# Patient Record
Sex: Female | Born: 1950 | Race: Black or African American | Hispanic: No | Smoking: Former smoker
Health system: Southern US, Community
[De-identification: ages and names within clinical notes are randomized; demographics above are authoritative.]

## PROBLEM LIST (undated history)

## (undated) DIAGNOSIS — K219 Gastro-esophageal reflux disease without esophagitis: Secondary | ICD-10-CM

## (undated) DIAGNOSIS — E119 Type 2 diabetes mellitus without complications: Secondary | ICD-10-CM

## (undated) DIAGNOSIS — D649 Anemia, unspecified: Secondary | ICD-10-CM

## (undated) DIAGNOSIS — G629 Polyneuropathy, unspecified: Secondary | ICD-10-CM

## (undated) DIAGNOSIS — M1990 Unspecified osteoarthritis, unspecified site: Secondary | ICD-10-CM

## (undated) DIAGNOSIS — C801 Malignant (primary) neoplasm, unspecified: Secondary | ICD-10-CM

## (undated) DIAGNOSIS — E669 Obesity, unspecified: Secondary | ICD-10-CM

## (undated) DIAGNOSIS — E785 Hyperlipidemia, unspecified: Secondary | ICD-10-CM

## (undated) DIAGNOSIS — M549 Dorsalgia, unspecified: Secondary | ICD-10-CM

## (undated) DIAGNOSIS — G919 Hydrocephalus, unspecified: Secondary | ICD-10-CM

## (undated) DIAGNOSIS — I10 Essential (primary) hypertension: Principal | ICD-10-CM

## (undated) DIAGNOSIS — E039 Hypothyroidism, unspecified: Secondary | ICD-10-CM

## (undated) DIAGNOSIS — G8929 Other chronic pain: Secondary | ICD-10-CM

## (undated) HISTORY — DX: Dorsalgia, unspecified: M54.9

## (undated) HISTORY — DX: Obesity, unspecified: E66.9

## (undated) HISTORY — DX: Type 2 diabetes mellitus without complications: E11.9

## (undated) HISTORY — DX: Unspecified osteoarthritis, unspecified site: M19.90

## (undated) HISTORY — DX: Hydrocephalus, unspecified: G91.9

## (undated) HISTORY — PX: TUBAL LIGATION: SHX77

## (undated) HISTORY — PX: OTHER SURGICAL HISTORY: SHX169

## (undated) HISTORY — DX: Hyperlipidemia, unspecified: E78.5

## (undated) HISTORY — DX: Essential (primary) hypertension: I10

## (undated) HISTORY — DX: Malignant (primary) neoplasm, unspecified: C80.1

## (undated) HISTORY — DX: Gastro-esophageal reflux disease without esophagitis: K21.9

## (undated) HISTORY — DX: Other chronic pain: G89.29

## (undated) HISTORY — PX: TRIGGER FINGER RELEASE: SHX641

---

## 1978-04-05 DIAGNOSIS — I10 Essential (primary) hypertension: Secondary | ICD-10-CM

## 1978-04-05 HISTORY — PX: TUBAL LIGATION: SHX77

## 1978-04-05 HISTORY — DX: Essential (primary) hypertension: I10

## 1989-04-05 DIAGNOSIS — E119 Type 2 diabetes mellitus without complications: Secondary | ICD-10-CM

## 1989-04-05 HISTORY — DX: Type 2 diabetes mellitus without complications: E11.9

## 1996-04-05 HISTORY — PX: VENTRICULOPERITONEAL SHUNT: SHX204

## 1996-04-05 HISTORY — PX: OTHER SURGICAL HISTORY: SHX169

## 1998-04-05 DIAGNOSIS — K219 Gastro-esophageal reflux disease without esophagitis: Secondary | ICD-10-CM

## 1998-04-05 HISTORY — DX: Gastro-esophageal reflux disease without esophagitis: K21.9

## 2000-10-09 ENCOUNTER — Emergency Department (HOSPITAL_COMMUNITY): Admission: EM | Admit: 2000-10-09 | Discharge: 2000-10-09 | Payer: Self-pay | Admitting: Internal Medicine

## 2001-06-05 ENCOUNTER — Encounter: Payer: Self-pay | Admitting: Unknown Physician Specialty

## 2001-06-05 ENCOUNTER — Ambulatory Visit (HOSPITAL_COMMUNITY): Admission: RE | Admit: 2001-06-05 | Discharge: 2001-06-05 | Payer: Self-pay | Admitting: Unknown Physician Specialty

## 2001-06-19 ENCOUNTER — Ambulatory Visit (HOSPITAL_COMMUNITY): Admission: RE | Admit: 2001-06-19 | Discharge: 2001-06-19 | Payer: Self-pay | Admitting: Unknown Physician Specialty

## 2001-09-01 ENCOUNTER — Emergency Department (HOSPITAL_COMMUNITY): Admission: EM | Admit: 2001-09-01 | Discharge: 2001-09-01 | Payer: Self-pay | Admitting: Emergency Medicine

## 2001-09-15 ENCOUNTER — Encounter: Payer: Self-pay | Admitting: Family Medicine

## 2001-09-15 ENCOUNTER — Ambulatory Visit (HOSPITAL_COMMUNITY): Admission: RE | Admit: 2001-09-15 | Discharge: 2001-09-15 | Payer: Self-pay | Admitting: Family Medicine

## 2001-09-28 ENCOUNTER — Emergency Department (HOSPITAL_COMMUNITY): Admission: EM | Admit: 2001-09-28 | Discharge: 2001-09-28 | Payer: Self-pay | Admitting: Emergency Medicine

## 2002-10-15 ENCOUNTER — Emergency Department (HOSPITAL_COMMUNITY): Admission: EM | Admit: 2002-10-15 | Discharge: 2002-10-15 | Payer: Self-pay | Admitting: Emergency Medicine

## 2002-10-16 ENCOUNTER — Encounter: Payer: Self-pay | Admitting: Emergency Medicine

## 2002-10-16 ENCOUNTER — Emergency Department (HOSPITAL_COMMUNITY): Admission: RE | Admit: 2002-10-16 | Discharge: 2002-10-16 | Payer: Self-pay | Admitting: Emergency Medicine

## 2003-01-10 ENCOUNTER — Ambulatory Visit (HOSPITAL_COMMUNITY): Admission: RE | Admit: 2003-01-10 | Discharge: 2003-01-10 | Payer: Self-pay | Admitting: Family Medicine

## 2004-01-13 ENCOUNTER — Ambulatory Visit (HOSPITAL_COMMUNITY): Admission: RE | Admit: 2004-01-13 | Discharge: 2004-01-13 | Payer: Self-pay | Admitting: Family Medicine

## 2004-09-22 ENCOUNTER — Emergency Department (HOSPITAL_COMMUNITY): Admission: EM | Admit: 2004-09-22 | Discharge: 2004-09-23 | Payer: Self-pay | Admitting: *Deleted

## 2004-12-23 ENCOUNTER — Emergency Department (HOSPITAL_COMMUNITY): Admission: EM | Admit: 2004-12-23 | Discharge: 2004-12-23 | Payer: Self-pay | Admitting: Emergency Medicine

## 2005-04-05 DIAGNOSIS — G8929 Other chronic pain: Secondary | ICD-10-CM

## 2005-04-05 HISTORY — DX: Other chronic pain: G89.29

## 2005-05-10 ENCOUNTER — Ambulatory Visit (HOSPITAL_COMMUNITY): Admission: RE | Admit: 2005-05-10 | Discharge: 2005-05-10 | Payer: Self-pay | Admitting: Family Medicine

## 2005-05-28 ENCOUNTER — Emergency Department (HOSPITAL_COMMUNITY): Admission: EM | Admit: 2005-05-28 | Discharge: 2005-05-28 | Payer: Self-pay | Admitting: Emergency Medicine

## 2006-03-28 ENCOUNTER — Ambulatory Visit (HOSPITAL_COMMUNITY): Admission: RE | Admit: 2006-03-28 | Discharge: 2006-03-28 | Payer: Self-pay | Admitting: Family Medicine

## 2006-05-24 ENCOUNTER — Ambulatory Visit (HOSPITAL_COMMUNITY): Admission: RE | Admit: 2006-05-24 | Discharge: 2006-05-24 | Payer: Self-pay | Admitting: Family Medicine

## 2006-06-16 ENCOUNTER — Ambulatory Visit (HOSPITAL_COMMUNITY): Admission: RE | Admit: 2006-06-16 | Discharge: 2006-06-16 | Payer: Self-pay | Admitting: Obstetrics and Gynecology

## 2007-06-07 ENCOUNTER — Ambulatory Visit (HOSPITAL_COMMUNITY): Admission: RE | Admit: 2007-06-07 | Discharge: 2007-06-07 | Payer: Self-pay | Admitting: Obstetrics and Gynecology

## 2007-07-07 ENCOUNTER — Other Ambulatory Visit: Admission: RE | Admit: 2007-07-07 | Discharge: 2007-07-07 | Payer: Self-pay | Admitting: Obstetrics and Gynecology

## 2008-07-03 ENCOUNTER — Ambulatory Visit (HOSPITAL_COMMUNITY): Admission: RE | Admit: 2008-07-03 | Discharge: 2008-07-03 | Payer: Self-pay | Admitting: Family Medicine

## 2008-08-01 ENCOUNTER — Other Ambulatory Visit: Admission: RE | Admit: 2008-08-01 | Discharge: 2008-08-01 | Payer: Self-pay | Admitting: Obstetrics and Gynecology

## 2009-05-12 ENCOUNTER — Encounter: Payer: Self-pay | Admitting: Family Medicine

## 2009-07-04 ENCOUNTER — Emergency Department (HOSPITAL_COMMUNITY): Admission: EM | Admit: 2009-07-04 | Discharge: 2009-07-04 | Payer: Self-pay | Admitting: Emergency Medicine

## 2009-07-08 ENCOUNTER — Ambulatory Visit (HOSPITAL_COMMUNITY): Admission: RE | Admit: 2009-07-08 | Discharge: 2009-07-08 | Payer: Self-pay | Admitting: Family Medicine

## 2009-09-07 ENCOUNTER — Emergency Department (HOSPITAL_COMMUNITY): Admission: EM | Admit: 2009-09-07 | Discharge: 2009-09-07 | Payer: Self-pay | Admitting: Emergency Medicine

## 2009-10-13 ENCOUNTER — Ambulatory Visit: Payer: Self-pay | Admitting: Family Medicine

## 2009-10-13 DIAGNOSIS — I10 Essential (primary) hypertension: Secondary | ICD-10-CM

## 2009-10-13 DIAGNOSIS — E669 Obesity, unspecified: Secondary | ICD-10-CM

## 2009-10-16 ENCOUNTER — Encounter: Payer: Self-pay | Admitting: Family Medicine

## 2009-10-16 ENCOUNTER — Other Ambulatory Visit: Admission: RE | Admit: 2009-10-16 | Discharge: 2009-10-16 | Payer: Self-pay | Admitting: Obstetrics and Gynecology

## 2009-10-19 DIAGNOSIS — G5600 Carpal tunnel syndrome, unspecified upper limb: Secondary | ICD-10-CM

## 2009-10-21 ENCOUNTER — Ambulatory Visit (HOSPITAL_COMMUNITY)
Admission: RE | Admit: 2009-10-21 | Discharge: 2009-10-21 | Payer: Self-pay | Source: Home / Self Care | Admitting: Obstetrics and Gynecology

## 2009-10-21 ENCOUNTER — Encounter: Payer: Self-pay | Admitting: Family Medicine

## 2009-10-24 ENCOUNTER — Encounter: Payer: Self-pay | Admitting: Family Medicine

## 2009-11-06 ENCOUNTER — Ambulatory Visit: Payer: Self-pay | Admitting: Orthopedic Surgery

## 2009-11-10 ENCOUNTER — Encounter: Payer: Self-pay | Admitting: Orthopedic Surgery

## 2009-11-14 ENCOUNTER — Telehealth: Payer: Self-pay | Admitting: Orthopedic Surgery

## 2009-11-18 ENCOUNTER — Ambulatory Visit: Payer: Self-pay | Admitting: Family Medicine

## 2009-11-18 DIAGNOSIS — M549 Dorsalgia, unspecified: Secondary | ICD-10-CM

## 2009-11-19 ENCOUNTER — Encounter: Payer: Self-pay | Admitting: Family Medicine

## 2009-11-20 LAB — CONVERTED CEMR LAB
AST: 16 units/L (ref 0–37)
Alkaline Phosphatase: 58 units/L (ref 39–117)
BUN: 20 mg/dL (ref 6–23)
CO2: 26 meq/L (ref 19–32)
Calcium: 9.3 mg/dL (ref 8.4–10.5)
Chloride: 105 meq/L (ref 96–112)
Cholesterol: 141 mg/dL (ref 0–200)
Creatinine, Ser: 1 mg/dL (ref 0.40–1.20)
Indirect Bilirubin: 0.2 mg/dL (ref 0.0–0.9)
Microalb Creat Ratio: 3.1 mg/g (ref 0.0–30.0)
Total Bilirubin: 0.3 mg/dL (ref 0.3–1.2)
Triglycerides: 140 mg/dL (ref <150)

## 2009-11-26 ENCOUNTER — Telehealth: Payer: Self-pay | Admitting: Family Medicine

## 2009-12-11 ENCOUNTER — Ambulatory Visit: Payer: Self-pay | Admitting: Orthopedic Surgery

## 2009-12-15 ENCOUNTER — Encounter: Payer: Self-pay | Admitting: Orthopedic Surgery

## 2009-12-29 ENCOUNTER — Telehealth: Payer: Self-pay | Admitting: Physician Assistant

## 2010-01-06 ENCOUNTER — Ambulatory Visit: Payer: Self-pay | Admitting: Family Medicine

## 2010-01-30 ENCOUNTER — Encounter: Payer: Self-pay | Admitting: Internal Medicine

## 2010-02-02 ENCOUNTER — Telehealth (INDEPENDENT_AMBULATORY_CARE_PROVIDER_SITE_OTHER): Payer: Self-pay

## 2010-02-09 ENCOUNTER — Telehealth (INDEPENDENT_AMBULATORY_CARE_PROVIDER_SITE_OTHER): Payer: Self-pay

## 2010-02-09 ENCOUNTER — Telehealth: Payer: Self-pay | Admitting: Family Medicine

## 2010-02-11 ENCOUNTER — Telehealth: Payer: Self-pay | Admitting: Family Medicine

## 2010-02-25 ENCOUNTER — Telehealth (INDEPENDENT_AMBULATORY_CARE_PROVIDER_SITE_OTHER): Payer: Self-pay | Admitting: *Deleted

## 2010-03-10 LAB — CONVERTED CEMR LAB
Basophils Relative: 0 % (ref 0–1)
Eosinophils Absolute: 0.2 10*3/uL (ref 0.0–0.7)
Hgb A1c MFr Bld: 7.4 % — ABNORMAL HIGH (ref <5.7)
Lymphocytes Relative: 32 % (ref 12–46)
Lymphs Abs: 2.7 10*3/uL (ref 0.7–4.0)
MCHC: 32.4 g/dL (ref 30.0–36.0)
MCV: 83.3 fL (ref 78.0–100.0)
Monocytes Absolute: 0.5 10*3/uL (ref 0.1–1.0)
Monocytes Relative: 6 % (ref 3–12)
Neutrophils Relative %: 59 % (ref 43–77)
Platelets: 335 10*3/uL (ref 150–400)
RDW: 14.6 % (ref 11.5–15.5)

## 2010-03-12 ENCOUNTER — Ambulatory Visit: Payer: Self-pay | Admitting: Family Medicine

## 2010-03-12 LAB — HM DIABETES FOOT EXAM

## 2010-03-17 ENCOUNTER — Telehealth: Payer: Self-pay | Admitting: Family Medicine

## 2010-03-18 ENCOUNTER — Encounter: Payer: Self-pay | Admitting: Family Medicine

## 2010-03-20 ENCOUNTER — Encounter (INDEPENDENT_AMBULATORY_CARE_PROVIDER_SITE_OTHER): Payer: Self-pay

## 2010-03-24 ENCOUNTER — Telehealth: Payer: Self-pay | Admitting: Family Medicine

## 2010-03-25 ENCOUNTER — Encounter: Payer: Self-pay | Admitting: Internal Medicine

## 2010-03-26 ENCOUNTER — Encounter: Payer: Self-pay | Admitting: Internal Medicine

## 2010-04-17 ENCOUNTER — Telehealth (INDEPENDENT_AMBULATORY_CARE_PROVIDER_SITE_OTHER): Payer: Self-pay

## 2010-04-21 ENCOUNTER — Ambulatory Visit
Admission: RE | Admit: 2010-04-21 | Discharge: 2010-04-21 | Payer: Self-pay | Source: Home / Self Care | Attending: Family Medicine | Admitting: Family Medicine

## 2010-04-21 ENCOUNTER — Ambulatory Visit (HOSPITAL_COMMUNITY): Admission: RE | Admit: 2010-04-21 | Payer: Self-pay | Source: Home / Self Care | Admitting: Internal Medicine

## 2010-04-21 DIAGNOSIS — K029 Dental caries, unspecified: Secondary | ICD-10-CM | POA: Insufficient documentation

## 2010-04-21 DIAGNOSIS — B369 Superficial mycosis, unspecified: Secondary | ICD-10-CM | POA: Insufficient documentation

## 2010-04-22 ENCOUNTER — Telehealth: Payer: Self-pay | Admitting: Family Medicine

## 2010-04-30 ENCOUNTER — Telehealth: Payer: Self-pay | Admitting: Family Medicine

## 2010-05-05 ENCOUNTER — Ambulatory Visit
Admission: RE | Admit: 2010-05-05 | Discharge: 2010-05-05 | Payer: Self-pay | Source: Home / Self Care | Attending: Family Medicine | Admitting: Family Medicine

## 2010-05-05 NOTE — Letter (Signed)
Summary: TCS ORDER/TRIAGE  TCS ORDER/TRIAGE   Imported By: Rexene Alberts 01/30/2010 14:36:52  _____________________________________________________________________  External Attachment:    Type:   Image     Comment:   External Document

## 2010-05-05 NOTE — Progress Notes (Signed)
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 pt has procedure on 02/10/2010. she will finish abx on 02/09/2010. Do you want her to reschedule or will her current use of abx be ok? please advise Initial call taken by: Hendricks Limes LPN,  February 02, 2010 3:34 PM     Appended Document: pt started on abx today will be ok  Appended Document: pt started on abx today Pt informed.

## 2010-05-05 NOTE — Progress Notes (Signed)
Summary: speak with nurse  Phone Note Call from Patient   Summary of Call: pt needs to speak with nurse about medicine. [phone] Initial call taken by: Rudene Anda,  November 26, 2009 11:11 AM  Follow-up for Phone Call        patient states you had discussed changing BP meds diovan and tekturna  advised i only see where amlodipine was increased Follow-up by: Adella Hare LPN,  November 26, 2009 4:35 PM  Additional Follow-up for Phone Call Additional follow up Details #1::        pls advise the combination will be with the amlodipine and diovan , so i have to see if the inc dose of diovan works first, then after I can change the tekturna to tekturna/hctz also Additional Follow-up by: Syliva Overman MD,  November 26, 2009 5:12 PM    Additional Follow-up for Phone Call Additional follow up Details #2::    patient aware Follow-up by: Adella Hare LPN,  November 27, 2009 11:37 AM

## 2010-05-05 NOTE — Progress Notes (Signed)
Summary: MEDICINE  Phone Note Call from Patient   Reason for Call: Talk to Nurse Summary of Call: NEEDS ACCU CHECK STRIPS FROM [facility] AND NEEDS GABAPENTIN [facility] 100 MG  DR. Nolen Mu GAVE IR TO HER AND WANTS YOU TO START WRITTING IT FOR HER SHE MISSED HER LAST APPT. WITH HIM SHE WAS HAVING PROBLEMS WITH HER LEGS  [facility] FAX #  [phone] CALL WHEN DONE SO SHE WILL KNOW Initial call taken by: Lind Guest,  February 11, 2010 8:55 AM  Follow-up for Phone Call        strips sent, will you be filling the gabapentin? Follow-up by: Adella Hare LPN,  February 11, 2010 10:25 AM  Additional Follow-up for Phone Call Additional follow up Details #1::        script printed pls let herknow and fax Additional Follow-up by: Syliva Overman MD,  February 11, 2010 5:19 PM    Additional Follow-up for Phone Call Additional follow up Details #2::    where is the rx at  Follow-up by: Lind Guest,  February 12, 2010 2:38 PM  Additional Follow-up for Phone Call Additional follow up Details #3:: Details for Additional Follow-up Action Taken: never mind the patient is aware Additional Follow-up by: Lind Guest,  February 12, 2010 2:39 PM  New/Updated Medications: ACCU-CHEK AVIVA  STRP (GLUCOSE BLOOD) three times a day testing YN:WGNF GABAPENTIN 100 MG CAPS (GABAPENTIN) Take 1 capsule by mouth three times a day Prescriptions: GABAPENTIN 100 MG CAPS (GABAPENTIN) Take 1 capsule by mouth three times a day  #270 x 3   Entered and Authorized by:   Syliva Overman MD   Signed by:   Syliva Overman MD on 02/11/2010   Method used:   Printed then faxed to ...       [facility] (mail-order)             , [place]         Ph: [phone]       Fax: [phone]   RxID:   [id number] ACCU-CHEK AVIVA  STRP (GLUCOSE BLOOD) three times a day testing QI:HKVQ  #270 x 0   Entered by:   Adella Hare LPN   Authorized by:   Syliva Overman MD   Signed by:   Adella Hare  LPN on 25/95/6387   Method used:   Faxed to ...       [facility] (mail-order)             , [place]         Ph: [phone]       Fax: [phone]   RxID:   [id number]  Gabapentin scripts have been faxed

## 2010-05-05 NOTE — Progress Notes (Signed)
Summary: back hurting  Phone Note Call from Patient   Summary of Call: back is hurting so bad she is keeping a heating pad on it and wants to be ref to to dr. Rhoderick Moody in [place] # [phone] call her at [phone] to let her know Initial call taken by: Lind Guest,  February 25, 2010 3:49 PM  Follow-up for Phone Call        advised will let dr simpson know but will be the first of next week before we can make referal Follow-up by: Adella Hare LPN,  February 25, 2010 4:36 PM  Additional Follow-up for Phone Call Additional follow up Details #1::        pls advise referral is fine, i will put in box, pls sched apptper pt request Additional Follow-up by: Syliva Overman MD,  March 01, 2010 5:24 AM    Additional Follow-up for Phone Call Additional follow up Details #2::    patient has an appt with Dr. Magnus Sinning on Dec. 6 at 10:30 patient is aware. Follow-up by: Curtis Sites,  March 02, 2010 9:06 AM

## 2010-05-05 NOTE — Assessment & Plan Note (Signed)
Summary: FOLLOW UP   Vital Signs:  Patient profile:   60 year old female Menstrual status:  postmenopausal Height:      62.5 inches Weight:      176.75 pounds BMI:     31.93 O2 Sat:      99 % on Room air Pulse rate:   97 / minute Pulse rhythm:   regular Resp:     16 per minute BP sitting:   140 / 60  (left arm)  Vitals Entered By: Adella Hare LPN (January 06, 2010 9:59 AM)  Nutrition Counseling: Patient's BMI is greater than 25 and therefore counseled on weight management options.  O2 Flow:  Room air CC: FOLLOW UP Is Patient Diabetic? Yes Did you bring your meter with you today? No Pain Assessment Patient in pain? no      Comments COMPLAINS OF SOME CHRONIC BACK PAIN   CC:  FOLLOW UP.  History of Present Illness: Reports  that she has been  doing well. Denies recent fever or chills. Denies sinus pressure, nasal congestion , ear pain or sore throat. Denies chest congestion, or cough productive of sputum. Denies chest pain, palpitations, PND, orthopnea or leg swelling. at times, she has felt as though her bl;ood pressure has been low and has experienced palpitations occasionally, in the office today her bP is still above goal. Denies abdominal pain, nausea, vomitting, diarrhea or constipation. Denies change in bowel movements or bloody stool. Denies dysuria , frequency, incontinence or hesitancy. Denies  joint pain, swelling, or reduced mobility. Denies headaches, vertigo, seizures. Denies depression, anxiety or insomnia. Denies  rash, lesions, or itch. She states her blood sugars have not been well controlled because of poor diet.she intends to change ths      Current Medications (verified): 1)  Tekturna Hct 300-12.5 Mg Tabs (Aliskiren-Hydrochlorothiazide) .... Take 1 Tablet By Mouth Once A Day 2)  Gabapentin 100 Mg Caps (Gabapentin) .... Take 1 Tablet By Mouth Three Times A Day 3)  Omeprazole 20 Mg Tbec (Omeprazole) .... Take 1 Tablet By Mouth Once A Day 4)   Ferrous Sulfate 325 (65 Fe) Mg Tbec (Ferrous Sulfate) .... Take 1 Tablet By Mouth Two Times A Day 5)  Aspir-Low 81 Mg Tbec (Aspirin) .... Take 1 Tablet By Mouth Once A Day 6)  Lantus Solostar 100 Unit/ml Soln (Insulin Glargine) .... 30 Units At Bedtime 7)  Diovan Hct 320-25 Mg Tabs (Valsartan-Hydrochlorothiazide) .... Take 1 Tablet By Mouth Once A Day 8)  Amlodipine Besylate 10 Mg Tabs (Amlodipine Besylate) .... Take 1 Tablet By Mouth Once A Day 9)  Cyclobenzaprine Hcl 10 Mg Tabs (Cyclobenzaprine Hcl) .... Take 1 Tab By Mouth At Bedtime As Needed 10)  Metformin Hcl 1000 Mg Tabs (Metformin Hcl) .... Take 1 Tablet By Mouth Two Times A Day 11)  Vitamin B 6 .... One Tab By Mouth Once Daily  Allergies (verified): 1)  ! * Glipizide 2)  ! * Avandia 3)  ! * Amaryl 4)  ! * Asa 325  Review of Systems      See HPI General:  Complains of fatigue. Eyes:  Complains of vision loss-both eyes; wears corrective lenses. Endo:  Denies excessive thirst and excessive urination. Heme:  Denies abnormal bruising and bleeding. Allergy:  Denies hives or rash and itching eyes.  Physical Exam  General:  Well-developedoverweight,in no acute distress; alert,appropriate and cooperative throughout examination HEENT: No facial asymmetry,  EOMI, No sinus tenderness, TM's Clear, oropharynx  pink and moist.  Chest: Clear to auscultation bilaterally.  CVS: S1, S2, No murmurs, No S3.   Abd: Soft, Nontender.  MS: decreased ROM spine,adequate in hips, shoulders and knees. positive tinnel's in both hands Ext: No edema.   CNS: CN 2-12 intact, power tone and sensation normal throughout.   Skin: Intact, no visible lesions or rashes.  Psych: Good eye contact, normal affect.  Memory intact, not anxious or depressed appearing.    Impression & Recommendations:  Problem # 1:  OBESITY, UNSPECIFIED (ICD-278.00) Assessment Unchanged  Ht: 62.5 (01/06/2010)   Wt: 176.75 (01/06/2010)   BMI: 31.93 (01/06/2010) therapeutic  lifestyle change discussed and encouraged  Problem # 2:  HYPERTENSION (ICD-401.9) Assessment: Improved  Her updated medication list for this problem includes:    Tekturna Hct 300-12.5 Mg Tabs (Aliskiren-hydrochlorothiazide) .Marland Kitchen... Take 1 tablet by mouth once a day    Diovan Hct 320-25 Mg Tabs (Valsartan-hydrochlorothiazide) .Marland Kitchen... Take 1 tablet by mouth once a day    Amlodipine Besylate 10 Mg Tabs (Amlodipine besylate) .Marland Kitchen... Take 1 tablet by mouth once a day  BP today: 140/60 Prior BP: 150/82 (11/18/2009)  Labs Reviewed: K+: 4.0 (11/19/2009) Creat: : 1.00 (11/19/2009)   Chol: 141 (11/19/2009)   HDL: 38 (11/19/2009)   LDL: 75 (11/19/2009)   TG: 140 (11/19/2009)  Problem # 3:  IDDM (ICD-250.01) Assessment: Deteriorated  Her updated medication list for this problem includes:    Aspir-low 81 Mg Tbec (Aspirin) .Marland Kitchen... Take 1 tablet by mouth once a day    Lantus Solostar 100 Unit/ml Soln (Insulin glargine) .Marland KitchenMarland KitchenMarland KitchenMarland Kitchen 30 units at bedtime    Diovan Hct 320-25 Mg Tabs (Valsartan-hydrochlorothiazide) .Marland Kitchen... Take 1 tablet by mouth once a day    Metformin Hcl 1000 Mg Tabs (Metformin hcl) .Marland Kitchen... Take 1 tablet by mouth two times a day  Orders: T- Hemoglobin A1C ([id number])  Labs Reviewed: Creat: 1.00 (11/19/2009)    Reviewed HgBA1c results: 8.3 (11/19/2009) Patient advised to reduce carbs and sweets, commit to regular physical activity, take meds as prescribed, test blood sugars as directed, and attempt to lose weight , to improve blood sugar control.  Complete Medication List: 1)  Tekturna Hct 300-12.5 Mg Tabs (Aliskiren-hydrochlorothiazide) .... Take 1 tablet by mouth once a day 2)  Gabapentin 100 Mg Caps (Gabapentin) .... Take 1 tablet by mouth three times a day 3)  Omeprazole 20 Mg Tbec (Omeprazole) .... Take 1 tablet by mouth once a day 4)  Ferrous Sulfate 325 (65 Fe) Mg Tbec (Ferrous sulfate) .... Take 1 tablet by mouth two times a day 5)  Aspir-low 81 Mg Tbec (Aspirin) .... Take 1 tablet by  mouth once a day 6)  Lantus Solostar 100 Unit/ml Soln (Insulin glargine) .... 30 units at bedtime 7)  Diovan Hct 320-25 Mg Tabs (Valsartan-hydrochlorothiazide) .... Take 1 tablet by mouth once a day 8)  Amlodipine Besylate 10 Mg Tabs (Amlodipine besylate) .... Take 1 tablet by mouth once a day 9)  Cyclobenzaprine Hcl 10 Mg Tabs (Cyclobenzaprine hcl) .... Take 1 tab by mouth at bedtime as needed 10)  Metformin Hcl 1000 Mg Tabs (Metformin hcl) .... Take 1 tablet by mouth two times a day 11)  Vitamin B 6  .... One tab by mouth once daily  Other Orders: T-CBC w/Diff [phone]) T-TSH [phone]) Influenza Vaccine MCR [phone])  Patient Instructions: 1)  F/U first week in December. 2)  It is important that you exercise regularly at least 40 minutes 5 times a week. If you develop chest pain, have severe difficulty breathing,  or feel very tired , stop exercising immediately and seek medical attention. 3)  You need to lose weight. Consider a lower calorie diet and regular exercise.  4)  Your blood pressure is better, no med change. 5)  Blood sugar is uncontrolled, pls check twice daily and keep with in range, increase lantus as directed , call with questions. 6)  Flu vac today 7)  . 8)  HbgA1C prior to visit, ICD-9: 9)  TSH prior to visit, ICD-9:   in December 10)  CBC w/ Diff prior to visit, ICD-9:   Immunizations Administered:  Influenza Vaccine # 1:    Vaccine Type: Fluvax MCR    Site: left deltoid    Mfr: novartis    Dose: 0.5 ml    Route: IM    Given by: Adella Hare LPN    Exp. Date: 08/2010    Lot #: [id number]    VIS given: 10/28/09 version given January 06, 2010.

## 2010-05-05 NOTE — Letter (Signed)
Summary: medical release  medical release   Imported By: Lind Guest 10/24/2009 11:37:00  _____________________________________________________________________  External Attachment:    Type:   Image     Comment:   External Document

## 2010-05-05 NOTE — Progress Notes (Signed)
Summary: [facility]  [facility]   Imported By: Lind Guest 10/21/2009 08:01:36  _____________________________________________________________________  External Attachment:    Type:   Image     Comment:   External Document
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
## 2010-05-05 NOTE — Progress Notes (Signed)
Summary: pt cancelled TCS  Phone Note Call from Patient   Caller: Patient Summary of Call: Pt Cx TCS, having issues with blood sugar dropping too much, had recent change in diabetic medicine. She wants to follow-up with Dr. Lodema Hong first about that and she will call to reschedule when blood sugar is better regulated. Informed Kim in Endo. Cancelled in IDX. Initial call taken by: Cloria Spring LPN,  February 09, 2010 10:42 AM     Appended Document: pt cancelled TCS noted  Appended Document: pt cancelled TCS Reminder placed in idx.
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
## 2010-05-05 NOTE — Assessment & Plan Note (Signed)
Summary: EVAL/TREAT CTS/REF [facility]F   Visit Type:  new patient Referring Provider:  Dr. Lodema Hong  CC:  Bilateral CTS.  History of Present Illness: I saw Christine Dominguez in the office today for an initial visit.  She is a 60 years old woman with the complaint of:  bilateral CTS.   This patient complains of significant pain and paresthesias of both upper extremities but she has not been treated.  She has some weakness in the hands with burning tingling sensation which is worse at night.  No prominence and noted there is no significant related repetitive activity.  She is diabetic.  She denies any history of thyroid disease.   Allergies: 1)  ! * Glipizide 2)  ! * Avandia 3)  ! * Amaryl 4)  ! 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 Neurologic:  Complains of numbness and tingling; denies unsteady gait, dizziness, tremors, and seizure. Musculoskeletal:  Complains of joint pain; denies swelling, instability, stiffness, redness, heat, and muscle pain. Immunology:  Complains of seasonal allergies; denies sinus problems and allergic to bee stings.  The review of systems is negative for Constitutional, Cardiovascular, Respiratory, Genitourinary, Endocrine, Psychiatric, Skin, HEENT, and Hemoatologic.  Physical Exam  Additional Exam:  this is a well-developed well-nourished female coming in hygiene are intact vital signs are stable as recorded.  She is oriented x3 her  mood and affect are normal she has normal gait and station  She has no lymphadenopathy she has mild swelling in both hands normal range of motion no instability and slightly weakened grip strength pinch shrink is normal her skin is normal and intact in both upper extremities  She exhibits normal pulse and temperature without edema or swelling in her radial and ulnar pulses of both RIGHT and LEFT arms  She has decreased sensation in both hands related to the carpal tunnel reflexes however are normal.  Coordination and balance are normal.     Impression & Recommendations:  Problem # 1:  CARPAL TUNNEL SYNDROME (ICD-354.0) Assessment Deteriorated  Orders: Neurology Referral (Neuro) New Patient Level III (43329)  Patient Instructions: 1)  Avoid repetitive activities and modify use of hand and wrist to reduce symptoms. Wear the cock-up wrist splint that will be provided until pain resolves then use nightly for prevention. Take medication as provided.  2)  Order Nerve Conduction Study  3)  F/u in 3 weeks

## 2010-05-05 NOTE — Assessment & Plan Note (Signed)
Summary: 3 WK RE-CK CTS/FOL/UP [facility]/CAF   Visit Type:  Follow-up Referring Kirk Sampley:  Dr. Lodema Hong  CC:  BILATERAL CTS.  History of Present Illness: I saw Christine Dominguez in the office today for a 3 week  followup visit.  She is a 60 years old woman with the complaint of:  bilateral CTS.  NCS results from Dr. Gerilyn Pilgrim.  Patient was put in a splint on her last visit. She says that the splint is helping. [at night]  Her hands are not swollen, she has full ROM, and she has normal grip strength   Stable carpal tunne syndrome at this time   Allergies: 1)  ! * Glipizide 2)  ! * Avandia 3)  ! * Amaryl 4)  ! * Asa 325  Past History:  Past Medical History: Last updated: 11/06/2009 Chronic back pain, disabled since 2007 Diabetes x 1991 Hypertensionx 1980 Obesity Tumor, right ear  Review of Systems Neurologic:  Complains of numbness and tingling; mild just at the tips of the finger .   Impression & Recommendations:  Problem # 1:  CARPAL TUNNEL SYNDROME (ICD-354.0) Assessment Improved  Orders: Est. Patient Level III (16109)  Patient Instructions: 1)  take viatmin B6 100 mg two times a day for 6 weeks  2)  wear brace at night  3)  return in 6 weeks

## 2010-05-05 NOTE — Progress Notes (Signed)
  Phone Note Call from Patient   Caller: Patient Summary of Call: states colonscopy was cancelled due to low blood sugars. She has had infected gum mnot eating well also eating better now that she is back in her own home, multiple episodes of low blood sugars. Advised reduce metformin to 500mg  twice dAILY AS BEFORE, ALSO REDUCE LANTUS TO 25 UNITS SEE HOW SUGARS DO, CALL back if needed Initial call taken by: Syliva Overman MD,  February 09, 2010 6:38 PM

## 2010-05-05 NOTE — Letter (Signed)
Summary: History from  History from   Imported By: Jacklynn Ganong 11/11/2009 16:52:47  _____________________________________________________________________  External Attachment:    Type:   Image     Comment:   External Document

## 2010-05-05 NOTE — Progress Notes (Signed)
Summary: blood pressure  Phone Note Call from Patient   Summary of Call: pt would like to know if she needs to take medicine for her blood pressure. 110-65  [phone] Initial call taken by: Rudene Anda,  December 29, 2009 10:38 AM  Follow-up for Phone Call        This morning her BP was 110/65.  It has been 105/50 at the lowest. She wanted to know if she needed to reduce BP med or what. I advised her that I couldn't change the dose but I would ask the PA about it tomorrow. If she started feeling lightheaded or faint then don't take it and when Dawn responds, I'll call her back about it Follow-up by: Everitt Amber LPN,  December 29, 2009 10:52 AM  Additional Follow-up for Phone Call Additional follow up Details #1::        Advise pt that 110/65 is good.  She needs to continue her BP medicine.  She has a follow up appt next week with Dr Lodema Hong & we will check her BP then. Additional Follow-up by: Esperanza Sheets PA,  December 30, 2009 8:15 AM    Additional Follow-up for Phone Call Additional follow up Details #2::    called patient left message  Follow-up by: Everitt Amber LPN,  December 30, 2009 9:57 AM  Additional Follow-up for Phone Call Additional follow up Details #3:: Details for Additional Follow-up Action Taken: Patient aware Additional Follow-up by: Everitt Amber LPN,  December 30, 2009 11:44 AM

## 2010-05-05 NOTE — Assessment & Plan Note (Signed)
Summary: new patient   Vital Signs:  Patient profile:   60 year old female Menstrual status:  postmenopausal Height:      62.5 inches Weight:      180.75 pounds BMI:     32.65 O2 Sat:      98 % Pulse rate:   83 / minute Pulse rhythm:   regular Resp:     16 per minute BP sitting:   160 / 80  (left arm) Cuff size:   large  Vitals Entered By: Everitt Amber LPN (October 13, 2009 3:50 PM)  Nutrition Counseling: Patient's BMI is greater than 25 and therefore counseled on weight management options. CC: NEW PATIENT ,states when she walks her BP drops lower     Menstrual Status postmenopausal   CC:  NEW PATIENT  and states when she walks her BP drops lower.  History of Present Illness: Seen at ED for left arm pain, EKG reportedly nl, she has never been to card and hads had no coronary artery disease symptoms. She has no known significant family h/o cAD symptoms. The pt states she has concerns about her blood pressure, both in medication choice as well as the control. She is new to this practice, and her stated concerns at this time relate to blood pressure management as well as to symtoms suggestrive of carpal tunnel. she has been disabled for sseveral years because of chronic back pain. She deenies any recent fever or chill;s. She denies symptoms of depression, anxiety or insomnia. She denies any recent fever or chills. She denies head or chest congestion. She denies any recent change in bowel movements, black or bloody stool. she denies dysuria, frequency or incontinence.She is post menopausal. she suffers from chronic back pain.    Preventive Screening-Counseling & Management  Alcohol-Tobacco     Smoking Status: never  Caffeine-Diet-Exercise     Does Patient Exercise: yes      Drug Use:  no.    Current Medications (verified): 1)  Tekturna Hct 300-12.5 Mg Tabs (Aliskiren-Hydrochlorothiazide) .... Take 1 Tablet By Mouth Once A Day 2)  Gabapentin 100 Mg Caps (Gabapentin) ....  Take 1 Tablet By Mouth Three Times A Day 3)  Naprosyn 500 Mg Tabs (Naproxen) .... Take 1 Tablet By Mouth Two Times A Day 4)  Verapamil Hcl 120 Mg Tabs (Verapamil Hcl) .... Take 1 Tablet By Mouth Two Times A Day 5)  Omeprazole 20 Mg Tbec (Omeprazole) .... Take 1 Tablet By Mouth Once A Day 6)  Ferrous Sulfate 325 (65 Fe) Mg Tbec (Ferrous Sulfate) .... Take 1 Tablet By Mouth Two Times A Day 7)  Metformin Hcl 500 Mg Tabs (Metformin Hcl) .... Take 1 Tablet By Mouth Two Times A Day 8)  Aspir-Low 81 Mg Tbec (Aspirin) .... Take 1 Tablet By Mouth Once A Day 9)  Lantus Solostar 100 Unit/ml Soln (Insulin Glargine) .... 30 Units At Bedtime 10)  Diovan Hct 320-25 Mg Tabs (Valsartan-Hydrochlorothiazide) .... Take 1 Tablet By Mouth Once A Day  Allergies (verified): 1)  ! * Glipizide 2)  ! * Avandia 3)  ! * Amaryl 4)  ! 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 HTN 1/2 brother-kidney failure  Social History: Occupation: disability Married Never Smoked Drug use-no Alcohol use-no Regular exercise-yes, walk mother of 3 children, all deceased  Smoking Status:  never Drug Use:  no Does Patient Exercise:  yes  Review of Systems      See HPI General:  Complains of fatigue. Eyes:  Complains of vision loss-both eyes; denies discharge, eye pain, and red eye; wears glasses. ENT:  Denies hoarseness, nasal congestion, sinus pressure, and sore throat. CV:  Denies chest pain or discomfort, difficulty breathing while lying down, lightheadness, near fainting, palpitations, shortness of  breath with exertion, and swelling of feet. Resp:  Denies cough, shortness of breath, and sputum productive. GI:  Denies abdominal pain, constipation, diarrhea, nausea, and vomiting. GU:  Denies discharge, dysuria, and urinary frequency; dr Emelda Fear does annual mamo and pap. MS:  Complains of low back pain, mid back pain, and stiffness; c/o bilateral hand pain with numbness, tingling and weakneess which is progressively worsening in the pasty 6  months. Derm:  Denies itching, lesion(s), and rash. Neuro:  Denies headaches, poor balance, and seizures. Psych:  Denies anxiety and depression. Endo:  Denies excessive thirst and excessive urination; tests infrequently, thinks her sugars are controlled. Heme:  Denies abnormal bruising and bleeding. Allergy:  Denies hives or rash and itching eyes.  Physical Exam  General:  Well-developedoverweight,in no acute distress; alert,appropriate and cooperative throughout examination HEENT: No facial asymmetry,  EOMI, No sinus tenderness, TM's Clear, oropharynx  pink and moist.   Chest: Clear to auscultation bilaterally.  CVS: S1, S2, No murmurs, No S3.   Abd: Soft, Nontender.  MS: decreased ROM spine,adequate in hips, shoulders and knees. positive tinnel's in both hands Ext: No edema.   CNS: CN 2-12 intact, power tone and sensation normal throughout.   Skin: Intact, no visible lesions or rashes.  Psych: Good eye contact, normal affect.  Memory intact, not anxious or depressed appearing.    Impression & Recommendations:  Problem # 1:  OBESITY, UNSPECIFIED (ICD-278.00) Assessment Comment Only  Ht: 62.5 (10/13/2009)   Wt: 180.75 (10/13/2009)   BMI: 32.65 (10/13/2009), pt encouraged to start regular exercise and to reduce caloric intake.  Problem # 2:  HYPERTENSION (ICD-401.9) Assessment: Comment Only  Her updated medication list for this problem includes:    Tekturna Hct 300-12.5 Mg Tabs (Aliskiren-hydrochlorothiazide) .Marland Kitchen... Take 1 tablet by mouth  once a day    Diovan Hct 320-25 Mg Tabs (Valsartan-hydrochlorothiazide) .Marland Kitchen... Take 1 tablet by mouth once a day    Amlodipine Besylate 5 Mg Tabs (Amlodipine besylate) .Marland Kitchen... 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 Take 1 tablet by mouth two times a day    Aspir-low 81 Mg Tbec (Aspirin) .Marland Kitchen... Take 1 tablet by mouth once a day    Lantus Solostar 100 Unit/ml Soln (Insulin glargine) .Marland KitchenMarland KitchenMarland KitchenMarland Kitchen 30 units at bedtime    Diovan Hct 320-25 Mg Tabs (Valsartan-hydrochlorothiazide) .Marland Kitchen... Take 1 tablet by mouth once a day need to obtain most recent labs from previous MD  Problem # 4:  CARPAL TUNNEL SYNDROME (ICD-354.0) Assessment: Comment Only  Orders: Orthopedic Referral (Ortho)  Complete Medication List: 1)  Tekturna Hct 300-12.5  Mg Tabs (Aliskiren-hydrochlorothiazide) .... Take 1 tablet by mouth once a day 2)  Gabapentin 100 Mg Caps (Gabapentin) .... Take 1 tablet by mouth three times a day 3)  Naprosyn 500 Mg Tabs (Naproxen) .... Take 1 tablet by mouth two times a day 4)  Omeprazole 20 Mg Tbec (Omeprazole) .... Take 1 tablet by mouth once a day 5)  Ferrous Sulfate 325 (65 Fe) Mg Tbec (Ferrous sulfate) .... Take 1 tablet by mouth two times a day 6)  Metformin Hcl 500 Mg Tabs (Metformin hcl) .... Take 1 tablet by mouth two times a day 7)  Aspir-low 81 Mg Tbec (Aspirin) .... Take 1 tablet by mouth once a day 8)  Lantus Solostar 100 Unit/ml Soln (Insulin glargine) .... 30 units at bedtime 9)  Diovan Hct 320-25 Mg Tabs (Valsartan-hydrochlorothiazide) .... Take 1 tablet by mouth once a day 10)  Amlodipine Besylate 5 Mg Tabs (Amlodipine besylate) .... Take 1 tablet by mouth once a day  Other Orders: Pneumococcal Vaccine  (16109) Admin 1st Vaccine (60454) Admin 1st Vaccine [facility]) [phone])  Patient Instructions: 1)  Please schedule a follow-up appointment in 5 weeks 2)  It is important that you exercise regularly at least 20 minutes 5 times a week. If you develop chest pain, have severe difficulty breathing, or feel very tired , stop exercising immediately and seek medical attention. 3)  You need to lose weight. Consider a lower calorie diet and regular exercise.  4)  Check your blood sugars regularly. If your readings are usually above 250 or below 70 you should contact our office. 5)  New med for blood pressure, pls stop the  cardizem 6)  You will be referred for carpal tunnel eval. 7)  You need a colonscopy Prescriptions: AMLODIPINE BESYLATE 5 MG TABS (AMLODIPINE BESYLATE) Take 1 tablet by mouth once a day  #30 x 3   Entered and Authorized by:   Syliva Overman MD   Signed by:   Syliva Overman MD on 10/13/2009   Method used:   Electronically to        [facility] (retail)       [address]       [place], [place]  [address]       Ph: [phone]       Fax: [phone]   RxID:   [id number]    Pneumovax    Vaccine Type: Pneumovax    Site: right deltoid    Mfr: Merck    Dose: 0.5 ml    Route: IM    Given by: Everitt Amber LPN    Exp. Date: 04/22/2011    Lot #: [id number]

## 2010-05-07 NOTE — Progress Notes (Signed)
Summary: medicine  Phone Note Call from Patient   Summary of Call: [facility] did not recieve her rx for the clonidine please send and let her know that it is done  call back at [phone] Initial call taken by: Lind Guest,  April 22, 2010 7:51 AM  Follow-up for Phone Call        pls call in script for the clonidine which was sent yesterday and letpt know Follow-up by: Syliva Overman MD,  April 22, 2010 8:05 AM    Prescriptions: MAXZIDE-25 37.5-25 MG TABS (TRIAMTERENE-HCTZ) Take 1 tablet by mouth once a day  #90 x 1   Entered by:   Adella Hare LPN   Authorized by:   Syliva Overman MD   Signed by:   Adella Hare LPN on 47/82/9562   Method used:   Faxed to ...       [facility] (mail-order)             , [place]         Ph: [phone]       Fax: [phone]   RxID:   [id number] AMLODIPINE BESYLATE 10 MG TABS (AMLODIPINE BESYLATE) Take 1 tablet by mouth once a day  #90 x 1   Entered by:   Adella Hare LPN   Authorized by:   Syliva Overman MD   Signed by:   Adella Hare LPN on 34/74/2595   Method used:   Faxed to ...       [facility] (mail-order)             , [place]         Ph: [phone]       Fax: [phone]   RxID:   [id number] CLONIDINE HCL 0.2 MG TABS (CLONIDINE HCL) Take 1 tab by mouth at bedtime start 04/22/2010, stop valterna effective 04/22/2010 pls  #30 x 2   Entered by:   Adella Hare LPN   Authorized by:   Syliva Overman MD   Signed by:   Adella Hare LPN on 22/05/5425   Method used:   Electronically to        [facility] (retail)       [address]       [place], [place]  [address]       Ph: [phone]       Fax: [phone]   RxID:   [id number]

## 2010-05-07 NOTE — Progress Notes (Signed)
Summary: BLOOD SUGARS DROPPING  Phone Note Call from Patient   Summary of Call: NEEDS FOR YOU TO CALL HER ABOUT HER BLOOD SUGARS  THEY HAVE BEEN DROPPING  FOR THE PAST FEW DAYS CALL BACK AT [phone] Initial call taken by: Lind Guest,  April 30, 2010 2:02 PM  Follow-up for Phone Call        fasting blood sugars running in 90's lowest 85 Follow-up by: Adella Hare LPN,  April 30, 2010 2:08 PM  Additional Follow-up for Phone Call Additional follow up Details #1::        pls advise reduce lantus to 22 units , if ove thenext 3 days still too low will need to reduce to 19 units, from the med list she is on 25 units, currently,if she is actually taking less, advise her to reduce current dose by 3 units Additional Follow-up by: Syliva Overman MD,  May 01, 2010 5:48 AM    Additional Follow-up for Phone Call Additional follow up Details #2::    Patient aware, Had been taking 25 units. Will reduce to 22, then 19 if still too low and call us back  Follow-up by: Everitt Amber LPN,  May 01, 2010 9:20 AM

## 2010-05-07 NOTE — Letter (Signed)
Summary: RX & INSTRUCTIONS FOR PREP  RX & INSTRUCTIONS FOR PREP   Imported By: Rexene Alberts 03/26/2010 15:25:43  _____________________________________________________________________  External Attachment:    Type:   Image     Comment:   External Document

## 2010-05-07 NOTE — Assessment & Plan Note (Signed)
Summary: F UP   Vital Signs:  Patient profile:   60 year old female Menstrual status:  postmenopausal Height:      62.5 inches Weight:      174.75 pounds BMI:     31.57 O2 Sat:      98 % on Room air Pulse rate:   101 / minute Pulse rhythm:   regular Resp:     16 per minute BP sitting:   150 / 70  (left arm)  Vitals Entered By: Adella Hare LPN (March 12, 2010 1:34 PM)  Nutrition Counseling: Patient's BMI is greater than 25 and therefore counseled on weight management options.  O2 Flow:  Room air CC: follow-up visit Is Patient Diabetic? Yes Did you bring your meter with you today? No   CC:  follow-up visit.  History of Present Illness: Reports  that she is geneally doing better. h c/o increased back pain in the past 2 weeks and is requesing injection for this. she still has concerns about uncontrolled BP which she tries to check at home Denies recent fever or chills. Denies sinus pressure, nasal congestion , ear pain or sore throat. Denies chest congestion, or cough productive of sputum. Denies chest pain, palpitations, PND, orthopnea or leg swelling. Denies abdominal pain, nausea, vomitting, diarrhea or constipation. Denies change in bowel movements or bloody stool. Denies dysuria , frequency, incontinence or hesitancy.  Denies headaches, vertigo, seizures. Denies depression, anxiety or insomnia. Denies  rash, lesions, or itch.     Current Medications (verified): 1)  Tekturna Hct 300-12.5 Mg Tabs (Aliskiren-Hydrochlorothiazide) .... Take 1 Tablet By Mouth Once A Day 2)  Omeprazole 20 Mg Tbec (Omeprazole) .... Take 1 Tablet By Mouth Once A Day 3)  Ferrous Sulfate 325 (65 Fe) Mg Tbec (Ferrous Sulfate) .... Take 1 Tablet By Mouth Two Times A Day 4)  Aspir-Low 81 Mg Tbec (Aspirin) .... Take 1 Tablet By Mouth Once A Day 5)  Lantus Solostar 100 Unit/ml Soln (Insulin Glargine) .... 25 Units At Bedtime 6)  Diovan Hct 320-25 Mg Tabs (Valsartan-Hydrochlorothiazide) ....  Take 1 Tablet By Mouth Once A Day 7)  Amlodipine Besylate 10 Mg Tabs (Amlodipine Besylate) .... Take 1 Tablet By Mouth Once A Day 8)  Cyclobenzaprine Hcl 10 Mg Tabs (Cyclobenzaprine Hcl) .... Take 1 Tab By Mouth At Bedtime As Needed 9)  Accu-Chek Aviva  Strp (Glucose Blood) .... Three Times A Day Testing [id number] 10)  Gabapentin 100 Mg Caps (Gabapentin) .... Take 1 Capsule By Mouth Three Times A Day 11)  Metformin Hcl 500 Mg Tabs (Metformin Hcl) .... One Tab By Mouth Two Times A Day  Allergies (verified): 1)  ! * Glipizide 2)  ! * Avandia 3)  ! * Amaryl 4)  ! * Asa 325  Review of Systems      See HPI Eyes:  Complains of vision loss-both eyes; denies blurring and discharge. MS:  Complains of low back pain and mid back pain. Endo:  Denies cold intolerance, excessive hunger, excessive thirst, and excessive urination. Heme:  Denies abnormal bruising and bleeding. Allergy:  Denies hives or rash and itching eyes.  Physical Exam  General:  Well-developed,well-nourished,in no acute distress; alert,appropriate and cooperative throughout examination HEENT: No facial asymmetry,  EOMI, No sinus tenderness, TM's Clear, oropharynx  pink and moist.   Chest: Clear to auscultation bilaterally.  CVS: S1, S2, No murmurs, No S3.   Abd: Soft, Nontender.  MS: decreasedROM spine, hips, shoulders and knees.  Ext: No edema.  CNS: CN 2-12 intact, power tone and sensation normal throughout.   Skin: Intact, no visible lesions or rashes.  Psych: Good eye contact, normal affect.  Memory intact, not anxious or depressed appearing.   Diabetes Management Exam:    Foot Exam (with socks and/or shoes not present):       Sensory-Monofilament:          Left foot: diminished          Right foot: diminished       Inspection:          Left foot: normal          Right foot: normal       Nails:          Left foot: normal          Right foot: normal   Impression & Recommendations:  Problem # 1:  BACK PAIN  (ICD-724.5) Assessment Deteriorated  Her updated medication list for this problem includes:    Aspir-low 81 Mg Tbec (Aspirin) .Marland Kitchen... Take 1 tablet by mouth once a day    Cyclobenzaprine Hcl 10 Mg Tabs (Cyclobenzaprine hcl) .Marland Kitchen... Take 1 tab by mouth at bedtime as needed  Orders: Ketorolac-Toradol 15mg  (J1914) Admin of Therapeutic Inj  intramuscular or subcutaneous (78295)  Problem # 2:  HYPERTENSION (ICD-401.9) Assessment: Improved  The following medications were removed from the medication list:    Tekturna Hct 300-12.5 Mg Tabs (Aliskiren-hydrochlorothiazide) .Marland Kitchen... Take 1 tablet by mouth once a day    Diovan Hct 320-25 Mg Tabs (Valsartan-hydrochlorothiazide) .Marland Kitchen... Take 1 tablet by mouth once a day Her updated medication list for this problem includes:    Amlodipine Besylate 10 Mg Tabs (Amlodipine besylate) .Marland Kitchen... Take 1 tablet by mouth once a day    Valturna 300-320 Mg Tabs (Aliskiren-valsartan) .Marland Kitchen... Take 1 tablet by mouth once a day    Maxzide-25 37.5-25 Mg Tabs (Triamterene-hctz) .Marland Kitchen... Take 1 tablet by mouth once a day  Orders: Medicare Electronic Prescription [phone]) T-Basic Metabolic Panel [phone])  BP today: 150/70 Prior BP: 140/60 (01/06/2010)  Labs Reviewed: K+: 4.0 (11/19/2009) Creat: : 1.00 (11/19/2009)   Chol: 141 (11/19/2009)   HDL: 38 (11/19/2009)   LDL: 75 (11/19/2009)   TG: 140 (11/19/2009)  Problem # 3:  IDDM (ICD-250.01) Assessment: Improved  The following medications were removed from the medication list:    Diovan Hct 320-25 Mg Tabs (Valsartan-hydrochlorothiazide) .Marland Kitchen... Take 1 tablet by mouth once a day    Metformin Hcl 1000 Mg Tabs (Metformin hcl) .Marland Kitchen... Take 1 tablet by mouth two times a day Her updated medication list for this problem includes:    Aspir-low 81 Mg Tbec (Aspirin) .Marland Kitchen... Take 1 tablet by mouth once a day    Lantus Solostar 100 Unit/ml Soln (Insulin glargine) .Marland Kitchen... 25 units at bedtime    Metformin Hcl 500 Mg Tabs (Metformin hcl) ..... One  tab by mouth two times a day  Orders: Medicare Electronic Prescription [phone]) T- Hemoglobin A1C [phone])  Labs Reviewed: Creat: 1.00 (11/19/2009)    Reviewed HgBA1c results: 7.4 (03/09/2010)  8.3 (11/19/2009)  Complete Medication List: 1)  Omeprazole 20 Mg Tbec (Omeprazole) .... Take 1 tablet by mouth once a day 2)  Ferrous Sulfate 325 (65 Fe) Mg Tbec (Ferrous sulfate) .... Take 1 tablet by mouth two times a day 3)  Aspir-low 81 Mg Tbec (Aspirin) .... Take 1 tablet by mouth once a day 4)  Lantus Solostar 100 Unit/ml Soln (Insulin glargine) .... 25 units at bedtime 5)  Amlodipine Besylate 10 Mg Tabs (Amlodipine besylate) .... Take 1 tablet by mouth once a day 6)  Cyclobenzaprine Hcl 10 Mg Tabs (Cyclobenzaprine hcl) .... Take 1 tab by mouth at bedtime as needed 7)  Accu-chek Aviva Strp (Glucose blood) .... Three times a day testing NW:GNFA 8)  Gabapentin 100 Mg Caps (Gabapentin) .... Take 1 capsule by mouth three times a day 9)  Metformin Hcl 500 Mg Tabs (Metformin hcl) .... One tab by mouth two times a day 10)  Valturna 300-320 Mg Tabs (Aliskiren-valsartan) .... Take 1 tablet by mouth once a day 11)  Maxzide-25 37.5-25 Mg Tabs (Triamterene-hctz) .... Take 1 tablet by mouth once a day  Patient Instructions: 1)  Please schedule a follow-up appointment in 3 months. 2)  It is important that you exercise regularly at least 20 minutes 5 times a week. If you develop chest pain, have severe difficulty breathing, or feel very tired , stop exercising immediately and seek medical attention. 3)  You need to lose weight. Consider a lower calorie diet and regular exercise.  4)  HbgA1C prior to visit, ICD-9: 5)  BMP prior to visit, ICD-9:   in 3 months 6)  Meds as discussed, your BP is still high at 150/80. 7)  Stop tekturna Ethelda Chick this will be in valturna 8)  New is triamteren/maxzide 9)  Check your blood sugars regularly. If your readings are usually above250: or below 70 you should contact  our office. 10)  Blood sugar is improved, congrats!! Prescriptions: MAXZIDE-25 37.5-25 MG TABS (TRIAMTERENE-HCTZ) Take 1 tablet by mouth once a day  #90 x 1   Entered and Authorized by:   Syliva Overman MD   Signed by:   Syliva Overman MD on 03/12/2010   Method used:   Printed then faxed to ...       [facility] (mail-order)             , [place]         Ph: [phone]       Fax: [phone]   RxID:   [id number] VALTURNA 300-320 MG TABS (ALISKIREN-VALSARTAN) Take 1 tablet by mouth once a day  #90 x 1   Entered and Authorized by:   Syliva Overman MD   Signed by:   Syliva Overman MD on 03/12/2010   Method used:   Printed then faxed to ...       [facility] (mail-order)             , [place]         Ph: [phone]       Fax: [phone]   RxID:   [id number]    Medication Administration  Injection # 1:    Medication: Ketorolac-Toradol 15mg     Diagnosis: BACK PAIN (ICD-724.5)    Route: IM    Site: LUOQ gluteus    Exp Date: 09/04/2011    Lot #: [id number]    Mfr: [email or web address]    Comments: toradol 60mg  given    Patient tolerated injection without complications    Given by: Adella Hare LPN (March 12, 2010 3:05 PM)  Orders Added: 1)  Est. Patient Level IV [73220] 2)  Medicare Electronic Prescription [G8553] 3)  T-Basic Metabolic Panel [[id number]] 4)  T- Hemoglobin A1C [83036-23375] 5)  Ketorolac-Toradol 15mg  [J1885] 6)  Admin of Therapeutic Inj  intramuscular or subcutaneous [96372]     Medication Administration  Injection # 1:    Medication: Ketorolac-Toradol 15mg     Diagnosis: BACK PAIN (  ICD-724.5)    Route: IM    Site: LUOQ gluteus    Exp Date: 09/04/2011    Lot #: [id number]    Mfr: [email or web address]    Comments: toradol 60mg  given    Patient tolerated injection without complications    Given by: Adella Hare LPN (March 12, 2010 3:05 PM)  Orders Added: 1)  Est. Patient Level IV [81191] 2)  Medicare Electronic Prescription [G8553] 3)   T-Basic Metabolic Panel [[id number]] 4)  T- Hemoglobin A1C [83036-23375] 5)  Ketorolac-Toradol 15mg  [J1885] 6)  Admin of Therapeutic Inj  intramuscular or subcutaneous [47829]

## 2010-05-07 NOTE — Progress Notes (Signed)
Summary: blood pressure cuff not being paid by insurance  Phone Note Other Incoming   Caller: Annice Pih from [facility] Summary of Call: the blood pressure cuff we have ordered for the patient isn't going to be covered by her insurance.  If you need to call the # is [phone] ext [phone] Initial call taken by: Curtis Sites,  March 24, 2010 4:38 PM  Follow-up for Phone Call        noted, unfortunately there is nothing I can do about this Follow-up by: Syliva Overman MD,  March 24, 2010 6:31 PM

## 2010-05-07 NOTE — Progress Notes (Signed)
Summary: MEDICINE  Phone Note Call from Patient   Summary of Call: NEEDS HER GAB  SENT TO [facility] AND WANTS A REFILL FROM [facility] FIRST THEN TRANSFER TO [facility] Initial call taken by: Lind Guest,  March 24, 2010 10:30 AM    Prescriptions: GABAPENTIN 100 MG CAPS (GABAPENTIN) Take 1 capsule by mouth three times a day  #270 x 0   Entered by:   Adella Hare LPN   Authorized by:   Syliva Overman MD   Signed by:   Adella Hare LPN on 16/01/9603   Method used:   Faxed to ...       [facility] (mail-order)             , [place]         Ph: [phone]       Fax: [phone]   RxID:   [id number] GABAPENTIN 100 MG CAPS (GABAPENTIN) Take 1 capsule by mouth three times a day  #90 x 0   Entered by:   Adella Hare LPN   Authorized by:   Syliva Overman MD   Signed by:   Adella Hare LPN on 32/44/0102   Method used:   Electronically to        [facility] (retail)       [address]       [place], [place]  [address]       Ph: [phone]       Fax: [phone]   RxID:   [id number]

## 2010-05-07 NOTE — Letter (Signed)
Summary: RX BLOOD PRESSURE CUFF  RX BLOOD PRESSURE CUFF   Imported By: Lind Guest 03/18/2010 14:11:57  _____________________________________________________________________  External Attachment:    Type:   Image     Comment:   External Document

## 2010-05-07 NOTE — Progress Notes (Signed)
Summary: RX BP CUFF  Phone Note Call from Patient   Summary of Call: LEFT MESSAGE NEEDS A BP CUFF RX GOING TO [facility] IN [place]  # [phone] Initial call taken by: Lind Guest,  March 17, 2010 5:13 PM  Follow-up for Phone Call        rx written pls let her know, in your box, she can collect ot whatever weorks for her Follow-up by: Syliva Overman MD,  March 18, 2010 12:21 PM  Additional Follow-up for Phone Call Additional follow up Details #1::        FAXED OVER THE RX  PATIENT AWARE Additional Follow-up by: Lind Guest,  March 18, 2010 3:46 PM

## 2010-05-07 NOTE — Progress Notes (Addendum)
Summary: phone note/ raw bottom/ diarrhea/ loose stools  Phone Note Call from Patient   Caller: Patient Summary of Call: Pt called and said she is scheduled for her TCS on Tues, 04/21/2010. She has just had diarrhea x one day and loose stools x one day and her bottom is raw. She wants to know if there is something she should use on it, and should she reschedule. (She uses [facility])..please advise! Initial call taken by: Cloria Spring LPN,  April 17, 2010 9:11 AM     Appended Document: phone note/ raw bottom/ diarrhea/ loose stools needs to be seen prior to TCS to look at rash/discuss diarrhea here or PCP      Appended Document: phone note/ raw bottom/ diarrhea/ loose stools pt informed. Tcs cancelled for 04/21/2010. She will reschedule later. Informed Kim in Endo.  Appended Document: phone note/ raw bottom/ diarrhea/ loose stools Pt is checking to see if PCP can see her before we could, around the 26th.  Appended Document: phone note/ raw bottom/ diarrhea/ loose stools Pt called back and has appt with Dr. Lodema Hong on 04/21/2010 @ 2:15.

## 2010-05-07 NOTE — Letter (Signed)
Summary: TCS ORDER/TRIAGE  TCS ORDER/TRIAGE   Imported By: Rexene Alberts 119-Jul-202011 13:23:47  _____________________________________________________________________  External Attachment:    Type:   Image     Comment:   External Document  Appended Document: TCS ORDER/TRIAGE hold iron x 5 days; decrease lantus to 18 units night before; hold pm dose of metformin night before; o/w ok  Appended Document: TCS ORDER/TRIAGE Rx and instructions mailed to pt.

## 2010-05-07 NOTE — Letter (Signed)
Summary: Recall Colonoscopy/Endoscopy, Change to Office Visit  [facility]  [address]   [place], [place] [address]   Phone: [phone]  Fax: [phone]      March 20, 2010   [facility] Doming[facility] [address] [place], [place]  [address] September 10, 1950   Dear Christine Dominguez,   According to our records, it is time for you to schedule a Colonoscopy/Endoscopy. However, after reviewing your medical record, we recommend an office visit in order to determine your need for a repeat procedure.  Please call [phone] at your convenience to schedule an office visit. If you have any questions or concerns, please feel free to contact our office.   Sincerely,   Cloria Spring LPN  [facility] Ph: [phone]   Fax: [phone]

## 2010-05-13 NOTE — Assessment & Plan Note (Signed)
Summary: office visit   Vital Signs:  Patient profile:   60 year old female Menstrual status:  postmenopausal Height:      62.5 inches Weight:      173.50 pounds BMI:     31.34 O2 Sat:      98 % on Room air Pulse rate:   89 / minute Pulse rhythm:   regular Resp:     16 per minute BP sitting:   150 / 60  (left arm)  Vitals Entered By: Adella Hare LPN (May 05, 2010 9:30 AM)  O2 Flow:  Room air CC: follow-up visit Is Patient Diabetic? Yes   CC:  follow-up visit.  History of Present Illness: c/o low back pain still persisting which radiaites down the legs. She is currently under the care of a chiropracter. Pt reports hypoglycemic episodes, and will therefore need a reduction in her lantus dose. she states the perianal rash is much better and she will be going for her colonscopy soon . She is tolerating her current BP meds well.  Current Medications (verified): 1)  Omeprazole 20 Mg Tbec (Omeprazole) .... Take 1 Tablet By Mouth Once A Day 2)  Ferrous Sulfate 325 (65 Fe) Mg Tbec (Ferrous Sulfate) .... Take 1 Tablet By Mouth Two Times A Day 3)  Aspir-Low 81 Mg Tbec (Aspirin) .... Take 1 Tablet By Mouth Once A Day 4)  Lantus Solostar 100 Unit/ml Soln (Insulin Glargine) .... 25 Units At Bedtime 5)  Amlodipine Besylate 10 Mg Tabs (Amlodipine Besylate) .... Take 1 Tablet By Mouth Once A Day 6)  Cyclobenzaprine Hcl 10 Mg Tabs (Cyclobenzaprine Hcl) .... Take 1 Tab By Mouth At Bedtime As Needed 7)  Accu-Chek Aviva  Strp (Glucose Blood) .... Three Times A Day Testing IO:NGEX 8)  Gabapentin 100 Mg Caps (Gabapentin) .... Take 1 Capsule By Mouth Three Times A Day 9)  Metformin Hcl 500 Mg Tabs (Metformin Hcl) .... One Tab By Mouth Two Times A Day 10)  Maxzide-25 37.5-25 Mg Tabs (Triamterene-Hctz) .... Take 1 Tablet By Mouth Once A Day 11)  Ketoconazole 2 % Crea (Ketoconazole) .... Apply To Affected Area As Needed 12)  Colace 100 Mg Caps (Docusate Sodium) .... As Needed 13)  Diovan 320 Mg  Tabs (Valsartan) .... Take 1 Tablet By Mouth Once A Day 14)  Clonidine Hcl 0.2 Mg Tabs (Clonidine Hcl) .... Take 1 Tab By Mouth At Bedtime Start 04/22/2010, Stop Valterna Effective 04/22/2010 Pls 15)  Nystop 100000 Unit/gm Powd (Nystatin) .... Apply Twice Daily To Affected Areas For 2 Weeks , Then As Needed For Rash 16)  Clotrimazole-Betamethasone 1-0.05 % Crea (Clotrimazole-Betamethasone) .... Apply Twice Daily For 10 Days To Rash, Then As Needed 17)  Keflex 500 Mg Caps (Cephalexin) .... Take 1 Capsule By Mouth Two Times A Day 18)  Fluconazole 150 Mg Tabs (Fluconazole) .... Take 1 Tablet By Mouth Once A Day  Allergies (verified): 1)  ! * Glipizide 2)  ! * Avandia 3)  ! * Amaryl 4)  ! * Asa 325  Review of Systems      See HPI General:  Complains of malaise. Eyes:  Complains of vision loss-both eyes. MS:  Complains of low back pain and mid back pain; chronic and unchanged. Endo:  Denies excessive thirst and excessive urination; pt reports hypoglycemic episodes, will decrease insulin dose. Heme:  Denies abnormal bruising and bleeding. Allergy:  Denies hives or rash and itching eyes.  Physical Exam  General:  Well-developed,well-nourished,in no acute distress; alert,appropriate and  cooperative throughout examination HEENT: No facial asymmetry,  EOMI, No sinus tenderness, TM's Clear, oropharynx  pink and moist.   Chest: Clear to auscultation bilaterally.  CVS: S1, S2, No murmurs, No S3.   Abd: Soft, Nontender.  MS: decreeased  ROM spine,adequate in hips, shoulders and knees.  Ext: No edema.   CNS: CN 2-12 intact, power tone and sensation normal throughout.   Skin: Intact, no visible lesions or rashes.  Psych: Good eye contact, normal affect.  Memory intact, not anxious or depressed appearing.    Impression & Recommendations:  Problem # 1:  HYPERTENSION (ICD-401.9) Assessment Deteriorated  Her updated medication list for this problem includes:    Amlodipine Besylate 10 Mg Tabs  (Amlodipine besylate) .Marland Kitchen... Take 1 tablet by mouth once a day    Maxzide-25 37.5-25 Mg Tabs (Triamterene-hctz) .Marland Kitchen... Take 1 tablet by mouth once a day    Diovan 320 Mg Tabs (Valsartan) .Marland Kitchen... Take 1 tablet by mouth once a day    Clonidine Hcl 0.2 Mg Tabs (Clonidine hcl) .Marland Kitchen... Take 1 tab by mouth at bedtime start 04/22/2010, stop valterna effective 04/22/2010 pls  Orders: Medicare Electronic Prescription [phone])  BP today: 150/60 Prior BP: 118/64 (04/21/2010)  Labs Reviewed: K+: 4.0 (11/19/2009) Creat: : 1.00 (11/19/2009)   Chol: 141 (11/19/2009)   HDL: 38 (11/19/2009)   LDL: 75 (11/19/2009)   TG: 140 (11/19/2009)  Problem # 2:  IDDM (ICD-250.01) Assessment: Comment Only  Her updated medication list for this problem includes:    Aspir-low 81 Mg Tbec (Aspirin) .Marland Kitchen... Take 1 tablet by mouth once a day    Lantus Solostar 100 Unit/ml Soln (Insulin glargine) .Marland Kitchen... 25 units at bedtime    Metformin Hcl 500 Mg Tabs (Metformin hcl) ..... One tab by mouth two times a day    Diovan 320 Mg Tabs (Valsartan) .Marland Kitchen... Take 1 tablet by mouth once a day Patient advised to reduce carbs and sweets, commit to regular physical activity, take meds as prescribed, test blood sugars as directed, and attempt to lose weight , to improve blood sugar control.  Labs Reviewed: Creat: 1.00 (11/19/2009)    Reviewed HgBA1c results: 7.4 (03/09/2010)  8.3 (11/19/2009)  Problem # 3:  OBESITY, UNSPECIFIED (ICD-278.00)  Ht: 62.5 (05/05/2010)   Wt: 173.50 (05/05/2010)   BMI: 31.34 (05/05/2010) therapeutic lifestyle change discussed and encouraged  Problem # 4:  BACK PAIN (ICD-724.5) Assessment: Unchanged  Her updated medication list for this problem includes:    Aspir-low 81 Mg Tbec (Aspirin) .Marland Kitchen... Take 1 tablet by mouth once a day    Cyclobenzaprine Hcl 10 Mg Tabs (Cyclobenzaprine hcl) .Marland Kitchen... Take 1 tab by mouth at bedtime as needed still seeing chiropracter  Complete Medication List: 1)  Omeprazole 20 Mg Tbec  (Omeprazole) .... Take 1 tablet by mouth once a day 2)  Ferrous Sulfate 325 (65 Fe) Mg Tbec (Ferrous sulfate) .... Take 1 tablet by mouth two times a day 3)  Aspir-low 81 Mg Tbec (Aspirin) .... Take 1 tablet by mouth once a day 4)  Lantus Solostar 100 Unit/ml Soln (Insulin glargine) .... 25 units at bedtime 5)  Amlodipine Besylate 10 Mg Tabs (Amlodipine besylate) .... Take 1 tablet by mouth once a day 6)  Cyclobenzaprine Hcl 10 Mg Tabs (Cyclobenzaprine hcl) .... Take 1 tab by mouth at bedtime as needed 7)  Accu-chek Aviva Strp (Glucose blood) .... Three times a day testing UE:AVWU 8)  Gabapentin 100 Mg Caps (Gabapentin) .... Take 1 capsule by mouth three times a day  9)  Metformin Hcl 500 Mg Tabs (Metformin hcl) .... One tab by mouth two times a day 10)  Maxzide-25 37.5-25 Mg Tabs (Triamterene-hctz) .... Take 1 tablet by mouth once a day 11)  Ketoconazole 2 % Crea (Ketoconazole) .... Apply to affected area as needed 12)  Colace 100 Mg Caps (Docusate sodium) .... As needed 13)  Diovan 320 Mg Tabs (Valsartan) .... Take 1 tablet by mouth once a day 14)  Clonidine Hcl 0.2 Mg Tabs (Clonidine hcl) .... Take 1 tab by mouth at bedtime start 04/22/2010, stop valterna effective 04/22/2010 pls 15)  Nystop 100000 Unit/gm Powd (Nystatin) .... Apply twice daily to affected areas for 2 weeks , then as needed for rash 16)  Clotrimazole-betamethasone 1-0.05 % Crea (Clotrimazole-betamethasone) .... Apply twice daily for 10 days to rash, then as needed 17)  Keflex 500 Mg Caps (Cephalexin) .... Take 1 capsule by mouth two times a day 18)  Fluconazole 150 Mg Tabs (Fluconazole) .... Take 1 tablet by mouth once a day  Patient Instructions: 1)  f/u as before 2)  Pls cut the lantus back to 10 units since your numbers are less than 110, increase only if needed as discussed  3)  No change in BP meds.BP today is 150/60 4)  At your next visit, we will look at your back pain some more if it persits. 5)  All the best with  your colonscopy Prescriptions: CLONIDINE HCL 0.2 MG TABS (CLONIDINE HCL) Take 1 tab by mouth at bedtime start 04/22/2010, stop valterna effective 04/22/2010 pls  #90 x 1   Entered by:   Everitt Amber LPN   Authorized by:   Syliva Overman MD   Signed by:   Everitt Amber LPN on 29/52/8413   Method used:   Faxed to ...       [facility] (mail-order)             , [place]         Ph: [phone]       Fax: [phone]   RxID:   [id number]    Orders Added: 1)  Est. Patient Level IV [18841] 2)  Medicare Electronic Prescription [phone]

## 2010-05-13 NOTE — Assessment & Plan Note (Signed)
Summary: diah.   Vital Signs:  Patient profile:   60 year old female Menstrual status:  postmenopausal Height:      62.5 inches Weight:      173.25 pounds BMI:     31.30 O2 Sat:      99 % Pulse rate:   88 / minute Pulse rhythm:   regular Resp:     16 per minute BP sitting:   118 / 64  (left arm) Cuff size:   large  Vitals Entered By: Everitt Amber LPN (April 21, 2010 2:44 PM)  Nutrition Counseling: Patient's BMI is greater than 25 and therefore counseled on weight management options. CC: Follow up chronic problems, had some diarrhea lastweek, still has a rash on her bottom   CC:  Follow up chronic problems, had some diarrhea lastweek, and still has a rash on her bottom.  History of Present Illness: Pt has gI virus last week, she was to have a colonscopy today but was told to get the raw area around her anus and needs this  treated before this is done. Rash under breast and in groin recurrent, peeling needs meds. Toothache x 2 days , like a cavity and sore gum   Denies recent fever or chills. Denies sinus pressure, nasal congestion , ear pain or sore throat. Denies chest congestion, or cough productive of sputum. Denies chest pain, palpitations, PND, orthopnea or leg swelling. Denies current  abdominal pain, nausea, vomitting, diarrhea or constipation. Denies change in bowel movements or bloody stool. Denies dysuria , frequency, incontinence or hesitancy. Denies  joint pain, swelling, or reduced mobility. Denies headaches, vertigo, seizures. Denies depression, anxiety or insomnia.     Current Medications (verified): 1)  Omeprazole 20 Mg Tbec (Omeprazole) .... Take 1 Tablet By Mouth Once A Day 2)  Ferrous Sulfate 325 (65 Fe) Mg Tbec (Ferrous Sulfate) .... Take 1 Tablet By Mouth Two Times A Day 3)  Aspir-Low 81 Mg Tbec (Aspirin) .... Take 1 Tablet By Mouth Once A Day 4)  Lantus Solostar 100 Unit/ml Soln (Insulin Glargine) .... 25 Units At Bedtime 5)  Amlodipine Besylate  10 Mg Tabs (Amlodipine Besylate) .... Take 1 Tablet By Mouth Once A Day 6)  Cyclobenzaprine Hcl 10 Mg Tabs (Cyclobenzaprine Hcl) .... Take 1 Tab By Mouth At Bedtime As Needed 7)  Accu-Chek Aviva  Strp (Glucose Blood) .... Three Times A Day Testing EA:VWUJ 8)  Gabapentin 100 Mg Caps (Gabapentin) .... Take 1 Capsule By Mouth Three Times A Day 9)  Metformin Hcl 500 Mg Tabs (Metformin Hcl) .... One Tab By Mouth Two Times A Day 10)  Valturna 300-320 Mg Tabs (Aliskiren-Valsartan) .... Take 1 Tablet By Mouth Once A Day 11)  Maxzide-25 37.5-25 Mg Tabs (Triamterene-Hctz) .... Take 1 Tablet By Mouth Once A Day 12)  Ketoconazole 2 % Crea (Ketoconazole) .... Apply To Affected Area As Needed 13)  Colace 100 Mg Caps (Docusate Sodium) .... As Needed  Allergies (verified): 1)  ! * Glipizide 2)  ! * Avandia 3)  ! * Amaryl 4)  ! * Asa 325  Review of Systems      See HPI General:  Complains of fatigue. Eyes:  Complains of vision loss-both eyes; denies discharge and red eye. Derm:  Complains of itching, lesion(s), and rash. Endo:  Denies excessive thirst and excessive urination. Heme:  Denies abnormal bruising and bleeding. Allergy:  Denies hives or rash and itching eyes.  Physical Exam  General:  Well-developed,well-nourished,in no acute distress; alert,appropriate and  cooperative throughout examination HEENT: No facial asymmetry,  EOMI, No sinus tenderness, TM's Clear, oropharynx  pink and moist. Dental caries  Chest: Clear to auscultation bilaterally.  CVS: S1, S2, No murmurs, No S3.   Abd: Soft, Nontender.  MS: Adequate ROM spine, hips, shoulders and knees.  Ext: No edema.   CNS: CN 2-12 intact, power tone and sensation normal throughout.   Skin: Intact, fungal rash under breasts, irritation perianally with candidiasis, no purulent drainage or skin breakdown Psych: Good eye contact, normal affect.  Memory intact, not anxious or depressed appearing.    Impression & Recommendations:  Problem  # 1:  DENTAL CARIES (ICD-521.00) Assessment Comment Only  Orders: Medicare Electronic Prescription [phone])  Problem # 2:  DERMATOMYCOSIS (ICD-111.9) Assessment: Deteriorated  Her updated medication list for this problem includes:    Ketoconazole 2 % Crea (Ketoconazole) .Marland Kitchen... Apply to affected area as needed    Nystop 100000 Unit/gm Powd (Nystatin) .Marland Kitchen... Apply twice daily to affected areas for 2 weeks , then as needed for rash    Clotrimazole-betamethasone 1-0.05 % Crea (Clotrimazole-betamethasone) .Marland Kitchen... Apply twice daily for 10 days to rash, then as needed    Fluconazole 150 Mg Tabs (Fluconazole) .Marland Kitchen... Take 1 tablet by mouth once a day  Problem # 3:  IDDM (ICD-250.01) Assessment: Comment Only  Her updated medication list for this problem includes:    Aspir-low 81 Mg Tbec (Aspirin) .Marland Kitchen... Take 1 tablet by mouth once a day    Lantus Solostar 100 Unit/ml Soln (Insulin glargine) .Marland Kitchen... 25 units at bedtime    Metformin Hcl 500 Mg Tabs (Metformin hcl) ..... One tab by mouth two times a day    Diovan 320 Mg Tabs (Valsartan) .Marland Kitchen... Take 1 tablet by mouth once a day Patient advised to reduce carbs and sweets, commit to regular physical activity, take meds as prescribed, test blood sugars as directed, and attempt to lose weight , to improve blood sugar control.  Labs Reviewed: Creat: 1.00 (11/19/2009)    Reviewed HgBA1c results: 7.4 (03/09/2010)  8.3 (11/19/2009)  Problem # 4:  HYPERTENSION (ICD-401.9) Assessment: Improved  The following medications were removed from the medication list:    Valturna 300-320 Mg Tabs (Aliskiren-valsartan) .Marland Kitchen... Take 1 tablet by mouth once a day Her updated medication list for this problem includes:    Amlodipine Besylate 10 Mg Tabs (Amlodipine besylate) .Marland Kitchen... Take 1 tablet by mouth once a day    Maxzide-25 37.5-25 Mg Tabs (Triamterene-hctz) .Marland Kitchen... Take 1 tablet by mouth once a day    Diovan 320 Mg Tabs (Valsartan) .Marland Kitchen... Take 1 tablet by mouth once a day     Clonidine Hcl 0.2 Mg Tabs (Clonidine hcl) .Marland Kitchen... Take 1 tab by mouth at bedtime start 04/22/2010, stop valterna effective 04/22/2010 pls  Orders: Medicare Electronic Prescription [phone])  BP today: 118/64 Prior BP: 150/70 (03/12/2010)  Labs Reviewed: K+: 4.0 (11/19/2009) Creat: : 1.00 (11/19/2009)   Chol: 141 (11/19/2009)   HDL: 38 (11/19/2009)   LDL: 75 (11/19/2009)   TG: 140 (11/19/2009)  Complete Medication List: 1)  Omeprazole 20 Mg Tbec (Omeprazole) .... Take 1 tablet by mouth once a day 2)  Ferrous Sulfate 325 (65 Fe) Mg Tbec (Ferrous sulfate) .... Take 1 tablet by mouth two times a day 3)  Aspir-low 81 Mg Tbec (Aspirin) .... Take 1 tablet by mouth once a day 4)  Lantus Solostar 100 Unit/ml Soln (Insulin glargine) .... 25 units at bedtime 5)  Amlodipine Besylate 10 Mg Tabs (Amlodipine besylate) .... Take  1 tablet by mouth once a day 6)  Cyclobenzaprine Hcl 10 Mg Tabs (Cyclobenzaprine hcl) .... Take 1 tab by mouth at bedtime as needed 7)  Accu-chek Aviva Strp (Glucose blood) .... Three times a day testing ZO:XWRU 8)  Gabapentin 100 Mg Caps (Gabapentin) .... Take 1 capsule by mouth three times a day 9)  Metformin Hcl 500 Mg Tabs (Metformin hcl) .... One tab by mouth two times a day 10)  Maxzide-25 37.5-25 Mg Tabs (Triamterene-hctz) .... Take 1 tablet by mouth once a day 11)  Ketoconazole 2 % Crea (Ketoconazole) .... Apply to affected area as needed 12)  Colace 100 Mg Caps (Docusate sodium) .... As needed 13)  Diovan 320 Mg Tabs (Valsartan) .... Take 1 tablet by mouth once a day 14)  Clonidine Hcl 0.2 Mg Tabs (Clonidine hcl) .... Take 1 tab by mouth at bedtime start 04/22/2010, stop valterna effective 04/22/2010 pls 15)  Nystop 100000 Unit/gm Powd (Nystatin) .... Apply twice daily to affected areas for 2 weeks , then as needed for rash 16)  Clotrimazole-betamethasone 1-0.05 % Crea (Clotrimazole-betamethasone) .... Apply twice daily for 10 days to rash, then as needed 17)  Keflex 500 Mg  Caps (Cephalexin) .... Take 1 capsule by mouth two times a day 18)  Fluconazole 150 Mg Tabs (Fluconazole) .... Take 1 tablet by mouth once a day  Patient Instructions: 1)  Please schedule a follow-up appointment in 2 weeks.nurse BP check. 2)  yOu are being treated for infected tooth and gum, you do need to see a dentist asap. 3)  You need to sTOP vALTuRNA, due to new drug warnings. 4)  new for your bP is clonidine at bedtime and diovan 320mg  , pls start both tomorrow. Your bP today is great. 5)  meds sent in for rash. 6)  Antibiotic sent in for gum and perianal lesion, use pure vaseline over anal area till it heals. Prescriptions: FLUCONAZOLE 150 MG TABS (FLUCONAZOLE) Take 1 tablet by mouth once a day  #5 x 0   Entered and Authorized by:   Syliva Overman MD   Signed by:   Syliva Overman MD on 04/21/2010   Method used:   Electronically to        [facility] (retail)       [address]       [place], [place]  [address]       Ph: [phone]       Fax: [phone]   RxID:   [id number] KEFLEX 500 MG CAPS (CEPHALEXIN) Take 1 capsule by mouth two times a day  #20 x 0   Entered and Authorized by:   Syliva Overman MD   Signed by:   Syliva Overman MD on 04/21/2010   Method used:   Electronically to        [facility] (retail)       [address]       [place], [place]  [address]       Ph: [phone]       Fax: [phone]   RxID:   [id number] CLOTRIMAZOLE-BETAMETHASONE 1-0.05 % CREA (CLOTRIMAZOLE-BETAMETHASONE) apply twice daily for 10 days to rash, then as needed  #45 gm x 1   Entered and Authorized by:   Syliva Overman MD   Signed by:   Syliva Overman MD on 04/21/2010   Method used:   Electronically to  [facility] (retail)       [address]       [place], [place]  [address]       Ph: [phone]       Fax: [phone]   RxID:    [id number] NYSTOP 100000 UNIT/GM POWD (NYSTATIN) apply twice daily to affected areas for 2 weeks , then as needed for rash  #60gm x 1   Entered and Authorized by:   Syliva Overman MD   Signed by:   Syliva Overman MD on 04/21/2010   Method used:   Electronically to        [facility] (retail)       [address]       [place], [place]  [address]       Ph: [phone]       Fax: [phone]   RxID:   [id number] CLONIDINE HCL 0.2 MG TABS (CLONIDINE HCL) Take 1 tab by mouth at bedtime start 04/22/2010, stop valterna effective 04/22/2010 pls  #30 x 2   Entered and Authorized by:   Syliva Overman MD   Signed by:   Syliva Overman MD on 04/21/2010   Method used:   Printed then faxed to ...       [facility] (retail)       [address]       [place], [place]  [address]       Ph: [phone]       Fax: [phone]   RxID:   [id number] DIOVAN 320 MG TABS (VALSARTAN) Take 1 tablet by mouth once a day  #30 x 3   Entered and Authorized by:   Syliva Overman MD   Signed by:   Syliva Overman MD on 04/21/2010   Method used:   Printed then faxed to ...       [facility] (retail)       [address]       [place], [place]  [address]       Ph: [phone]       Fax: [phone]   RxID:   [id number]    Orders Added: 1)  Est. Patient Level IV [40086] 2)  Medicare Electronic Prescription [G8553]    DIOVAN 320MG  SAMPLES GIVEN X 8

## 2010-05-18 ENCOUNTER — Telehealth: Payer: Self-pay | Admitting: Family Medicine

## 2010-05-18 ENCOUNTER — Telehealth (INDEPENDENT_AMBULATORY_CARE_PROVIDER_SITE_OTHER): Payer: Self-pay

## 2010-05-19 ENCOUNTER — Telehealth: Payer: Self-pay | Admitting: Family Medicine

## 2010-05-21 ENCOUNTER — Encounter: Payer: Self-pay | Admitting: Family Medicine

## 2010-05-21 ENCOUNTER — Ambulatory Visit (INDEPENDENT_AMBULATORY_CARE_PROVIDER_SITE_OTHER): Payer: Medicare Other | Admitting: Family Medicine

## 2010-05-21 ENCOUNTER — Telehealth: Payer: Self-pay | Admitting: Family Medicine

## 2010-05-21 DIAGNOSIS — E669 Obesity, unspecified: Secondary | ICD-10-CM

## 2010-05-21 DIAGNOSIS — I10 Essential (primary) hypertension: Secondary | ICD-10-CM

## 2010-05-21 DIAGNOSIS — E109 Type 1 diabetes mellitus without complications: Secondary | ICD-10-CM

## 2010-05-21 LAB — CONVERTED CEMR LAB
BUN: 29 mg/dL — ABNORMAL HIGH (ref 6–23)
CO2: 22 meq/L (ref 19–32)
Calcium: 9.4 mg/dL (ref 8.4–10.5)
Creatinine, Ser: 1.22 mg/dL — ABNORMAL HIGH (ref 0.40–1.20)
Glucose, Bld: 95 mg/dL (ref 70–99)

## 2010-05-26 ENCOUNTER — Telehealth: Payer: Self-pay | Admitting: Family Medicine

## 2010-05-27 NOTE — Progress Notes (Addendum)
Summary: ????ready to reschedule TCS  Phone Note Outgoing Call   Call placed by: Parris Signer Call placed to: Patient Summary of Call: [facility] for pt to call.  (Is she ready to reschedule TCS?) Initial call taken by: Cloria Spring LPN,  May 18, 2010 4:14 PM     Appended Document: ????ready to reschedule TCS Pt says she is having trouble regulating blood sugars, and she has appt with Dr. Lodema Hong on March 8th, and she will call us to reschedule after that.

## 2010-05-27 NOTE — Progress Notes (Signed)
Summary: blood sugars  Phone Note Call from Patient   Summary of Call: left message that someone needs to to call her about her blood sugars today call at [phone] or [phone] Initial call taken by: Lind Guest,  May 19, 2010 2:38 PM  Follow-up for Phone Call        fasting 125 random 105. She said she didn't know if that was too low because she hadn't checked it at the end of the day yet. Told her to continue the 5 units as directed by the doctor and if at any point she becomes concerned about her numbers to call us back. Patient understands Follow-up by: Everitt Amber LPN,  May 19, 2010 2:50 PM

## 2010-05-27 NOTE — Progress Notes (Signed)
Summary: blood sugars  Phone Note Call from Patient   Summary of Call: wants you to call her about her blood sugars  running low Initial call taken by: Lind Guest,  May 21, 2010 1:07 PM  Follow-up for Phone Call        pt coming in at 3:15 Follow-up by: Everitt Amber LPN,  May 21, 2010 1:12 PM

## 2010-05-27 NOTE — Progress Notes (Signed)
Summary: insulin  Phone Note Call from Patient   Summary of Call: patient was told to reduce her insulin to  10 units at night and now she is having to orange juice at night  this morning it was 101. please call back at [phone] or cell # [phone] Initial call taken by: Lind Guest,  May 18, 2010 9:29 AM  Follow-up for Phone Call        reduce insulin to 7 units, then to 5 units if she continues to need to take the orange juioce, call back if still a prob Follow-up by: Syliva Overman MD,  May 18, 2010 10:30 AM  Additional Follow-up for Phone Call Additional follow up Details #1::        called patient left message Additional Follow-up by: Everitt Amber LPN,  May 18, 2010 3:04 PM    Additional Follow-up for Phone Call Additional follow up Details #2::    patient aware Follow-up by: Everitt Amber LPN,  May 18, 2010 3:07 PM

## 2010-05-27 NOTE — Assessment & Plan Note (Signed)
Summary: f up   Vital Signs:  Patient profile:   60 year old female Menstrual status:  postmenopausal Height:      62.5 inches Weight:      175 pounds BMI:     31.61 O2 Sat:      98 % Pulse rate:   91 / minute Pulse rhythm:   regular Resp:     16 per minute BP sitting:   130 / 70  (left arm) Cuff size:   large  Vitals Entered By: Everitt Amber LPN   Nutrition Counseling: Patient's BMI is greater than 25 and therefore counseled on weight management options. CC: Last night her sugar was 101, ate sandwich and some juice (was 120 fasting this morning) around 12 she got to sweating and feeling shakey and took glucose/juice and sandwich and rechecked it and it was 123.    CC:  Last night her sugar was 101 and ate sandwich and some juice (was 120 fasting this morning) around 12 she got to sweating and feeling shakey and took glucose/juice and sandwich and rechecked it and it was 123. Marland Kitchen  History of Present Illness: Reports  that she has not been doing well as far as her blood sugars are concerned, she has recurrent hypoglycemia on a daily basis, despite being on reducing quantities of lantus. Denies recent fever or chills. Denies sinus pressure, nasal congestion , ear pain or sore throat. Denies chest congestion, or cough productive of sputum. Denies chest pain, palpitations, PND, orthopnea or leg swelling. Denies abdominal pain, nausea, vomitting, diarrhea or constipation. Denies change in bowel movements or bloody stool. Denies dysuria , frequency, incontinence or hesitancy. Denies  joint pain, swelling, or reduced mobility. Denies headaches, vertigo, seizures. Denies depression, anxiety or insomnia. Denies  rash, lesions, or itch.     Current Medications (verified): 1)  Omeprazole 20 Mg Tbec (Omeprazole) .... Take 1 Tablet By Mouth Once A Day 2)  Ferrous Sulfate 325 (65 Fe) Mg Tbec (Ferrous Sulfate) .... Take 1 Tablet By Mouth Two Times A Day 3)  Aspir-Low 81 Mg Tbec (Aspirin)  .... Take 1 Tablet By Mouth Once A Day 4)  Lantus Solostar 100 Unit/ml Soln (Insulin Glargine) .... 4 Units At Bedtime 5)  Amlodipine Besylate 10 Mg Tabs (Amlodipine Besylate) .... Take 1 Tablet By Mouth Once A Day 6)  Cyclobenzaprine Hcl 10 Mg Tabs (Cyclobenzaprine Hcl) .... Take 1 Tab By Mouth At Bedtime As Needed 7)  Accu-Chek Aviva  Strp (Glucose Blood) .... Three Times A Day Testing [id number] 8)  Gabapentin 100 Mg Caps (Gabapentin) .... Take 1 Capsule By Mouth Three Times A Day 9)  Metformin Hcl 500 Mg Tabs (Metformin Hcl) .... One Tab By Mouth Two Times A Day 10)  Maxzide-25 37.5-25 Mg Tabs (Triamterene-Hctz) .... Take 1 Tablet By Mouth Once A Day 11)  Colace 100 Mg Caps (Docusate Sodium) .... As Needed 12)  Diovan 320 Mg Tabs (Valsartan) .... Take 1 Tablet By Mouth Once A Day 13)  Clonidine Hcl 0.2 Mg Tabs (Clonidine Hcl) .... Take 1 Tab By Mouth At Bedtime Start 04/22/2010, Stop Valterna Effective 04/22/2010 Pls 14)  Nystop 100000 Unit/gm Powd (Nystatin) .... Apply Twice Daily To Affected Areas For 2 Weeks , Then As Needed For Rash 15)  Clotrimazole-Betamethasone 1-0.05 % Crea (Clotrimazole-Betamethasone) .... Apply Twice Daily For 10 Days To Rash, Then As Needed  Allergies (verified): 1)  ! * Glipizide 2)  ! * Avandia 3)  ! * Amaryl 4)  ! *  Asa 325  Review of Systems      See HPI Eyes:  Denies blurring, discharge, eye pain, and red eye. Endo:  recurrent hypoglycemias. Heme:  Denies abnormal bruising and bleeding. Allergy:  Denies hives or rash and itching eyes.  Physical Exam  General:  Well-developed,well-nourished,in no acute distress; alert,appropriate and cooperative throughout examination HEENT: No facial asymmetry,  EOMI, No sinus tenderness, TM's Clear, oropharynx  pink and moist.   Chest: Clear to auscultation bilaterally.  CVS: S1, S2, No murmurs, No S3.   Abd: Soft, Nontender.  MS: Adequate ROM spine, hips, shoulders and knees.  Ext: No edema.   CNS: CN 2-12  intact, power tone and sensation normal throughout.   Skin: Intact, no visible lesions or rashes.  Psych: Good eye contact, normal affect.  Memory intact, not anxious or depressed appearing.    Impression & Recommendations:  Problem # 1:  HYPERTENSION (ICD-401.9) Assessment Improved  Her updated medication list for this problem includes:    Amlodipine Besylate 10 Mg Tabs (Amlodipine besylate) .Marland Kitchen... Take 1 tablet by mouth once a day    Maxzide-25 37.5-25 Mg Tabs (Triamterene-hctz) .Marland Kitchen... Take 1 tablet by mouth once a day    Diovan 320 Mg Tabs (Valsartan) .Marland Kitchen... Take 1 tablet by mouth once a day    Clonidine Hcl 0.2 Mg Tabs (Clonidine hcl) .Marland Kitchen... 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 One tab by mouth two times a day Her updated medication list for this problem includes:    Aspir-low 81 Mg Tbec (Aspirin) .Marland Kitchen... Take 1 tablet by mouth once a day    Diovan 320 Mg Tabs (Valsartan) .Marland Kitchen... Take 1 tablet by mouth once a day overcorrected per pt history , all meds discontinued and hBA1C ordered Orders: T- Hemoglobin A1C ([id number])  Problem # 3:  OBESITY, UNSPECIFIED (ICD-278.00) Assessment: Unchanged  Ht: 62.5 (05/21/2010)   Wt: 175 (05/21/2010)   BMI: 31.61 (05/21/2010) therapeutic lifestyle change discussed and encouraged  Complete Medication List: 1)  Omeprazole 20 Mg Tbec (Omeprazole) .... Take 1 tablet by mouth once a day 2)   Ferrous Sulfate 325 (65 Fe) Mg Tbec (Ferrous sulfate) .... Take 1 tablet by mouth two times a day 3)  Aspir-low 81 Mg Tbec (Aspirin) .... Take 1 tablet by mouth once a day 4)  Amlodipine Besylate 10 Mg Tabs (Amlodipine besylate) .... Take 1 tablet by mouth once a day 5)  Cyclobenzaprine Hcl 10 Mg Tabs (Cyclobenzaprine hcl) .... Take 1 tab by mouth at bedtime as needed 6)  Accu-chek Aviva Strp (Glucose blood) .... Three times a day testing [id number] 7)  Gabapentin 100 Mg Caps (Gabapentin) .... Take 1 capsule by mouth three times a day 8)  Maxzide-25 37.5-25 Mg Tabs (Triamterene-hctz) .... Take 1 tablet by mouth once a day 9)  Colace 100 Mg Caps (Docusate sodium) .... As needed 10)  Diovan 320 Mg Tabs (Valsartan) .... Take 1 tablet by mouth once a  day 11)  Clonidine Hcl 0.2 Mg Tabs (Clonidine hcl) .... Take 1 tab by mouth at bedtime start 04/22/2010, stop valterna effective 04/22/2010 pls 12)  Nystop 100000 Unit/gm Powd (Nystatin) .... Apply twice daily to affected areas for 2 weeks , then as needed for rash 13)  Clotrimazole-betamethasone 1-0.05 % Crea (Clotrimazole-betamethasone) .... Apply twice daily for 10 days to rash, then as needed  Patient Instructions: 1)  F/U as before. 2)  PLS STOP all med for blood sugar effective today, no lantus or metformin. 3)  HBA1C and chem 7 today 4)  Test 2 to 3 times every day. 5)  Fasting, or before any meal,  range is 80 to 120 6)  2 hours after any meal or bedtime the range is 130 to 170.Marland Kitchen 7)  Call with any probems 8)  Your blood pressure is great, it is 130/70   Orders Added: 1)  Est. Patient Level IV [99214] 2)  T-Basic Metabolic Panel [[id number]] 3)  T- Hemoglobin A1C [[id number]]

## 2010-06-02 NOTE — Progress Notes (Signed)
Summary: [facility]  Phone Note Call from Patient   Summary of Call: needs diovan faxed back to [facility]. [phone] Initial call taken by: Rudene Anda,  May 26, 2010 8:24 AM    Prescriptions: DIOVAN 320 MG TABS (VALSARTAN) Take 1 tablet by mouth once a day  #90 x 0   Entered by:   Adella Hare LPN   Authorized by:   Syliva Overman MD   Signed by:   Adella Hare LPN on 45/40/9811   Method used:   Faxed to ...       [facility] (mail-order)             , [place]         Ph: [phone]       Fax: [phone]   RxID:   [id number]

## 2010-06-04 HISTORY — PX: COLONOSCOPY: SHX174

## 2010-06-11 ENCOUNTER — Ambulatory Visit (INDEPENDENT_AMBULATORY_CARE_PROVIDER_SITE_OTHER): Payer: Medicare PPO | Admitting: Family Medicine

## 2010-06-11 ENCOUNTER — Encounter: Payer: Self-pay | Admitting: Family Medicine

## 2010-06-11 DIAGNOSIS — E119 Type 2 diabetes mellitus without complications: Secondary | ICD-10-CM

## 2010-06-11 DIAGNOSIS — I10 Essential (primary) hypertension: Secondary | ICD-10-CM

## 2010-06-11 DIAGNOSIS — E1122 Type 2 diabetes mellitus with diabetic chronic kidney disease: Secondary | ICD-10-CM | POA: Insufficient documentation

## 2010-06-11 DIAGNOSIS — E669 Obesity, unspecified: Secondary | ICD-10-CM

## 2010-06-11 DIAGNOSIS — N183 Chronic kidney disease, stage 3 (moderate): Secondary | ICD-10-CM

## 2010-06-12 ENCOUNTER — Other Ambulatory Visit: Payer: Self-pay | Admitting: Family Medicine

## 2010-06-12 DIAGNOSIS — Z139 Encounter for screening, unspecified: Secondary | ICD-10-CM

## 2010-06-15 ENCOUNTER — Telehealth: Payer: Self-pay | Admitting: Family Medicine

## 2010-06-15 ENCOUNTER — Telehealth (INDEPENDENT_AMBULATORY_CARE_PROVIDER_SITE_OTHER): Payer: Self-pay | Admitting: *Deleted

## 2010-06-16 ENCOUNTER — Encounter: Payer: Self-pay | Admitting: Family Medicine

## 2010-06-17 ENCOUNTER — Encounter: Payer: Self-pay | Admitting: Internal Medicine

## 2010-06-23 NOTE — Assessment & Plan Note (Signed)
Summary: bp ck  Nurse Visit   Vital Signs:  Patient profile:   60 year old female Menstrual status:  postmenopausal BP sitting:   140 / 78  (right arm)  History of Present Illness: pt reports havoing light headedness intermittently her bP cuff whicgh she brings in and which agrees with my reading shows a blood pressureearlier today of 88/53, this occure on a daily basis   Patient Instructions: 1)  you will be referred to cardiology for blood pressure eval   Allergies: 1)  ! * Glipizide 2)  ! * Avandia 3)  ! * Amaryl 4)  ! * Asa 325  Orders Added: 1)  Cardiology Referral [Cardiology]

## 2010-06-23 NOTE — Progress Notes (Signed)
Summary: bp check  Phone Note Call from Patient   Summary of Call: pt wants to know if she can come in for a bp check. [phone] Initial call taken by: Rudene Anda,  June 15, 2010 11:04 AM  Follow-up for Phone Call        ok for nurse visit, pls chose a time she feels her bp is off Follow-up by: Syliva Overman MD,  June 15, 2010 3:48 PM  Additional Follow-up for Phone Call Additional follow up Details #1::        will advise luann to schedule Additional Follow-up by: Adella Hare LPN,  June 15, 2010 4:11 PM     Appended Document: bp check patient aware

## 2010-06-23 NOTE — Assessment & Plan Note (Signed)
Summary: follow up 3 mths   Vital Signs:  Patient profile:   60 year old female Menstrual status:  postmenopausal Height:      62.5 inches Weight:      170.25 pounds BMI:     30.75 O2 Sat:      98 % Pulse rate:   83 / minute Pulse rhythm:   regular Resp:     16 per minute BP sitting:   130 / 72  (left arm) Cuff size:   large  Vitals Entered By: Everitt Amber LPN (June 11, 8467 8:10 AM)  Nutrition Counseling: Patient's BMI is greater than 25 and therefore counseled on weight management options. CC: Follow up chronic problems, concerned because sometimes her blood pressure drops in the evening into the 90's and she thinks its too low   CC:  Follow up chronic problems and concerned because sometimes her blood pressure drops in the evening into the 90's and she thinks its too low.  History of Present Illness: Reports  that tshe is doing well. her blood sugars have improved. she shas concerns that her blood pressure drops at times. she has continued to delay her colonscopy. Denies recent fever or chills. Denies sinus pressure, nasal congestion , ear pain or sore throat. Denies chest congestion, or cough productive of sputum. Denies chest pain, palpitations, PND, orthopnea or leg swelling. Denies abdominal pain, nausea, vomitting, diarrhea or constipation. Denies change in bowel movements or bloody stool. Denies dysuria , frequency, incontinence or hesitancy. Denies  joint pain, swelling, or reduced mobility. Denies headaches, vertigo, seizures. Denies depression, anxiety or insomnia. Denies  rash, lesions, or itch.     Current Medications (verified): 1)  Omeprazole 20 Mg Tbec (Omeprazole) .... Take 1 Tablet By Mouth Once A Day 2)  Ferrous Sulfate 325 (65 Fe) Mg Tbec (Ferrous Sulfate) .... Take 1 Tablet By Mouth Two Times A Day 3)  Aspir-Low 81 Mg Tbec (Aspirin) .... Take 1 Tablet By Mouth Once A Day 4)  Amlodipine Besylate 10 Mg Tabs (Amlodipine Besylate) .... Take 1 Tablet By  Mouth Once A Day 5)  Cyclobenzaprine Hcl 10 Mg Tabs (Cyclobenzaprine Hcl) .... Take 1 Tab By Mouth At Bedtime As Needed 6)  Accu-Chek Aviva  Strp (Glucose Blood) .... Three Times A Day Testing [id number] 7)  Gabapentin 100 Mg Caps (Gabapentin) .... Take 1 Capsule By Mouth Three Times A Day 8)  Maxzide-25 37.5-25 Mg Tabs (Triamterene-Hctz) .... Take 1 Tablet By Mouth Once A Day 9)  Colace 100 Mg Caps (Docusate Sodium) .... As Needed 10)  Diovan 320 Mg Tabs (Valsartan) .... Take 1 Tablet By Mouth Once A Day 11)  Clonidine Hcl 0.2 Mg Tabs (Clonidine Hcl) .... Take 1 Tab By Mouth At Bedtime Start 04/22/2010, Stop Valterna Effective 04/22/2010 Pls 12)  Nystop 100000 Unit/gm Powd (Nystatin) .... Apply Twice Daily To Affected Areas For 2 Weeks , Then As Needed For Rash 13)  Clotrimazole-Betamethasone 1-0.05 % Crea (Clotrimazole-Betamethasone) .... Apply Twice Daily For 10 Days To Rash, Then As Needed  Allergies (verified): 1)  ! * Glipizide 2)  ! * Avandia 3)  ! * Amaryl 4)  ! * Asa 325  Review of Systems      See HPI General:  Complains of fatigue. Eyes:  Denies discharge and eye pain. Endo:  Denies cold intolerance, excessive hunger, excessive thirst, excessive urination, and polyuria. Heme:  Denies abnormal bruising and bleeding. Allergy:  Denies hives or rash and itching eyes.  Physical Exam  General:  Well-developed,well-nourished,in no acute distress; alert,appropriate and cooperative throughout examination HEENT: No facial asymmetry,  EOMI, No sinus tenderness, TM's Clear, oropharynx  pink and moist.   Chest: Clear to auscultation bilaterally.  CVS: S1, S2, No murmurs, No S3.   Abd: Soft, Nontender.  MS: Adequate ROM spine, hips, shoulders and knees.  Ext: No edema.   CNS: CN 2-12 intact, power tone and sensation normal throughout.   Skin: Intact, no visible lesions or rashes.  Psych: Good eye contact, normal affect.  Memory intact, not anxious or depressed  appearing.    Impression & Recommendations:  Problem # 1:  DIABETES MELLITUS, TYPE II (ICD-250.00) Assessment Improved  Her updated medication list for this problem includes:    Aspir-low 81 Mg Tbec (Aspirin) .Marland Kitchen... Take 1 tablet by mouth once a day    Diovan 320 Mg Tabs (Valsartan) .Marland Kitchen... Take 1 tablet by mouth once a day    Metformin Hcl 500 Mg Tabs (Metformin hcl) .Marland Kitchen... Take 1 tablet by mouth two times a day  Orders: T- Hemoglobin A1C ([id number])  Labs Reviewed: Creat: 1.22 (05/21/2010)    Reviewed HgBA1c results: 7.0 (05/21/2010)  7.4 (03/09/2010)  Problem # 2:  OBESITY, UNSPECIFIED (ICD-278.00) Assessment: Improved  Ht: 62.5 (06/11/2010)   Wt: 170.25 (06/11/2010)   BMI: 30.75 (06/11/2010) therapeutic lifestyle change discussed and encouraged  Problem # 3:  HYPERTENSION (ICD-401.9) Assessment: Unchanged  Her updated medication list for this problem includes:    Amlodipine Besylate 10 Mg Tabs (Amlodipine besylate) .Marland Kitchen... Take 1 tablet by mouth once a day    Maxzide-25 37.5-25 Mg Tabs (Triamterene-hctz) .Marland Kitchen... Take 1 tablet by mouth once a day    Diovan 320 Mg Tabs (Valsartan) .Marland Kitchen... Take 1 tablet by mouth once a day    Clonidine Hcl 0.2 Mg Tabs (Clonidine hcl) .Marland Kitchen... Take 1 tab by mouth at bedtime start 04/22/2010, stop valterna effective 04/22/2010 pls  Orders: T-Basic Metabolic Panel [id number]) Medicare Electronic Prescription [phone])  BP today: 130/72 Prior BP: 130/70 (05/21/2010)  Labs Reviewed: K+: 4.5 (05/21/2010) Creat: : 1.22 (05/21/2010)   Chol: 141 (11/19/2009)   HDL: 38 (11/19/2009)   LDL: 75 (11/19/2009)   TG: 140 (11/19/2009)  Complete Medication List: 1)  Omeprazole 20 Mg Tbec (Omeprazole) .... Take 1 tablet by mouth once a day 2)  Ferrous Sulfate 325 (65 Fe) Mg Tbec (Ferrous sulfate) .... Take 1 tablet by mouth two times a day 3)  Aspir-low 81 Mg Tbec (Aspirin) .... Take 1 tablet by mouth once a day 4)  Amlodipine Besylate 10 Mg Tabs (Amlodipine  besylate) .... Take 1 tablet by mouth once a day 5)  Cyclobenzaprine Hcl 10 Mg Tabs (Cyclobenzaprine hcl) .... Take 1 tab by mouth at bedtime as needed 6)  Accu-chek Aviva Strp (Glucose blood) .... Three times a day testing [id number] 7)  Gabapentin 100 Mg Caps (Gabapentin) .... Take 1 capsule by mouth three times a day 8)  Maxzide-25 37.5-25 Mg Tabs (Triamterene-hctz) .... Take 1 tablet by mouth once a day 9)  Colace 100 Mg Caps (Docusate sodium) .... As needed 10)  Diovan 320 Mg Tabs (Valsartan) .... Take 1 tablet by mouth once a day 11)  Clonidine Hcl 0.2 Mg Tabs (Clonidine hcl) .... Take 1 tab by mouth at bedtime start 04/22/2010, stop valterna effective 04/22/2010 pls 12)  Nystop 100000 Unit/gm Powd (Nystatin) .... Apply twice daily to affected areas for 2 weeks , then as needed for rash 13)  Clotrimazole-betamethasone 1-0.05 % Crea (Clotrimazole-betamethasone) .... Apply  twice daily for 10 days to rash, then as needed 14)  Metformin Hcl 500 Mg Tabs (Metformin hcl) .... Take 1 tablet by mouth two times a day  Patient Instructions: 1)  f/u end May . 2)  BMP prior to visit, ICD-9: 3)  HbgA1C prior to visit, ICD-9:   non fasting 4)  It is important that you exercise regularly at least 30 minutes 5 times a week. If you develop chest pain, have severe difficulty breathing, or feel very tired , stop exercising immediately and seek medical attention. 5)  You need to lose weight. Consider a lower calorie diet and regular exerciseCongrats on weight loss keep it up. 6)  start metformin 500mg  one daily for 1 week, if fasting sugars are over 110 in week 2 increase to one twice daily pls. 7)  your bP is great, no med changes. 8)  pls sched your colonscopy  Prescriptions: CLONIDINE HCL 0.2 MG TABS (CLONIDINE HCL) Take 1 tab by mouth at bedtime start 04/22/2010, stop valterna effective 04/22/2010 pls  #90 x 1   Entered by:   Adella Hare LPN   Authorized by:   Syliva Overman MD   Signed by:   Adella Hare LPN on 16/01/9603   Method used:   Faxed to ...       [facility] (mail-order)             , [place]         Ph: [phone]       Fax: [phone]   RxID:   [id number] DIOVAN 320 MG TABS (VALSARTAN) Take 1 tablet by mouth once a day  #90 x 1   Entered by:   Adella Hare LPN   Authorized by:   Syliva Overman MD   Signed by:   Adella Hare LPN on 32/44/0102   Method used:   Faxed to ...       [facility] (mail-order)             , [place]         Ph: [phone]       Fax: [phone]   RxID:   [id number] MAXZIDE-25 37.5-25 MG TABS (TRIAMTERENE-HCTZ) Take 1 tablet by mouth once a day  #90 x 1   Entered by:   Adella Hare LPN   Authorized by:   Syliva Overman MD   Signed by:   Adella Hare LPN on 10/03/1599   Method used:   Faxed to ...       [facility] (mail-order)             , [place]         Ph: [phone]       Fax: [phone]   RxID:   [id number] GABAPENTIN 100 MG CAPS (GABAPENTIN) Take 1 capsule by mouth three times a day  #270 x 1   Entered by:   Adella Hare LPN   Authorized by:   Syliva Overman MD   Signed by:   Adella Hare LPN on 71/09/2692   Method used:   Faxed to ...       [facility] (mail-order)             , [place]         Ph: [phone]       Fax: [phone]   RxID:   [id number] AMLODIPINE BESYLATE 10 MG TABS (AMLODIPINE BESYLATE) Take 1 tablet by mouth once a day  #90 x 1  Entered by:   Adella Hare LPN   Authorized by:   Syliva Overman MD   Signed by:   Adella Hare LPN on 16/01/9603   Method used:   Faxed to ...       [facility] (mail-order)             , [place]         Ph: [phone]       Fax: [phone]   RxID:   [id number]    Orders Added: 1)  Est. Patient Level IV [32440] 2)  T-Basic Metabolic Panel [[id number]] 3)  T- Hemoglobin A1C [[id number]] 4)  Medicare Electronic Prescription [phone]

## 2010-06-23 NOTE — Letter (Signed)
Summary: TRIAGE ORDER  TRIAGE ORDER   Imported By: Ave Filter 06/17/2010 11:53:34  _____________________________________________________________________  External Attachment:    Type:   Image     Comment:   External Document

## 2010-06-23 NOTE — Progress Notes (Signed)
Summary: refill  Phone Note Call from Patient   Summary of Call: [facility] can't fill rx because office hasn't approved them. [phone] Initial call taken by: Rudene Anda,  June 15, 2010 3:59 PM  Follow-up for Phone Call        patient aware meds have been sent Follow-up by: Adella Hare LPN,  June 15, 2010 4:13 PM

## 2010-06-29 ENCOUNTER — Emergency Department (HOSPITAL_COMMUNITY)
Admission: EM | Admit: 2010-06-29 | Discharge: 2010-06-29 | Disposition: A | Discharge status: Home / Self Care | Payer: Medicare HMO | Attending: Emergency Medicine | Admitting: Emergency Medicine

## 2010-06-29 DIAGNOSIS — I10 Essential (primary) hypertension: Secondary | ICD-10-CM | POA: Insufficient documentation

## 2010-06-29 DIAGNOSIS — R0602 Shortness of breath: Secondary | ICD-10-CM | POA: Insufficient documentation

## 2010-06-29 DIAGNOSIS — D649 Anemia, unspecified: Secondary | ICD-10-CM | POA: Insufficient documentation

## 2010-06-29 DIAGNOSIS — Z79899 Other long term (current) drug therapy: Secondary | ICD-10-CM | POA: Insufficient documentation

## 2010-06-29 DIAGNOSIS — E119 Type 2 diabetes mellitus without complications: Secondary | ICD-10-CM | POA: Insufficient documentation

## 2010-06-29 DIAGNOSIS — R000 Tachycardia, unspecified: Secondary | ICD-10-CM | POA: Insufficient documentation

## 2010-06-29 LAB — GLUCOSE, CAPILLARY

## 2010-06-30 ENCOUNTER — Encounter: Payer: Medicare HMO | Admitting: Internal Medicine

## 2010-06-30 ENCOUNTER — Ambulatory Visit (HOSPITAL_COMMUNITY)
Admission: RE | Admit: 2010-06-30 | Discharge: 2010-06-30 | Disposition: A | Discharge status: Home / Self Care | Payer: Medicare HMO | Source: Ambulatory Visit | Attending: Internal Medicine | Admitting: Internal Medicine

## 2010-06-30 DIAGNOSIS — K5730 Diverticulosis of large intestine without perforation or abscess without bleeding: Secondary | ICD-10-CM

## 2010-06-30 DIAGNOSIS — Z1211 Encounter for screening for malignant neoplasm of colon: Secondary | ICD-10-CM | POA: Insufficient documentation

## 2010-07-08 ENCOUNTER — Ambulatory Visit (INDEPENDENT_AMBULATORY_CARE_PROVIDER_SITE_OTHER): Payer: Medicare HMO | Admitting: Cardiology

## 2010-07-08 ENCOUNTER — Encounter: Payer: Self-pay | Admitting: Cardiology

## 2010-07-08 DIAGNOSIS — I10 Essential (primary) hypertension: Secondary | ICD-10-CM

## 2010-07-08 MED ORDER — AMLODIPINE BESYLATE 5 MG PO TABS: 5.0000 mg | ORAL_TABLET | Freq: Every day | ORAL | Status: DC

## 2010-07-08 MED ORDER — TRIAMTERENE-HCTZ 37.5-25 MG PO TABS: 0.5000 | ORAL_TABLET | Freq: Every day | ORAL | Status: DC

## 2010-07-08 MED ORDER — CLONIDINE HCL 0.1 MG PO TABS: 0.1000 mg | ORAL_TABLET | Freq: Two times a day (BID) | ORAL | Status: DC

## 2010-07-08 NOTE — Progress Notes (Signed)
HPI:  Initial visit for this very pleasant 60 year old woman referred by Dr. Lodema Hong for assessment and treatment of labile hypertension.  Patient has had high blood pressure for decades with generally adequate control.  She has no known cardiac disease and has never previously been evaluated by a cardiologist.  Of late, and she notes episodes of orthostatic dizziness associated with seated blood pressure of approximately 90/60.  She notes weakness and malaise when her blood pressure is excessively low; however, at Dr. Anthony Sar office, blood pressure has tended to be high normal to mildly elevated.  Current Outpatient Prescriptions on File Prior to Visit  Medication Sig Dispense Refill  . clotrimazole-betamethasone (LOTRISONE) cream Apply topically 2 (two) times daily. Apply twice a day fir 10 days to rash , then as needed       . cyclobenzaprine (FLEXERIL) 10 MG tablet Take 10 mg by mouth as needed.       . docusate sodium (COLACE) 100 MG capsule Take 100 mg by mouth as needed.        . gabapentin (NEURONTIN) 100 MG capsule Take 100 mg by mouth 2 (two) times daily.       Marland Kitchen glucose blood (ACCU-CHEK AVIVA) test strip 1 each by Other route 3 (three) times daily. Three times a day testing LK:GMWN       . ketoconazole (NIZORAL) 2 % cream Apply topically daily. Apply to affected area as needed       . metFORMIN (GLUCOPHAGE) 500 MG tablet Take 500 mg by mouth daily.       Marland Kitchen nystatin (NYSTOP) 100000 UNIT/GM POWD Apply topically. Apply twice daily to affected area fr 2 weeks, then as needed for rash       . omeprazole (PRILOSEC) 20 MG capsule Take 20 mg by mouth daily. Take one tablet by mouth once a day       . valsartan (DIOVAN) 320 MG tablet Take 320 mg by mouth daily. take one tablet by mouth once daily         . DISCONTD: amLODipine (NORVASC) 10 MG tablet Take 10 mg by mouth daily. Take one tablet by mouth once a day      . DISCONTD: cloNIDine (CATAPRES) 0.2 MG tablet Take 0.2 mg by mouth daily. Take  one tablet by mouth at bedtime start 04/22/2010, stop valterna effective 04/22/2010 pls       . DISCONTD: triamterene-hydrochlorothiazide (MAXZIDE-25) 37.5-25 MG per tablet Take 0.5 tablets by mouth daily.       . fluconazole (DIFLUCAN) 150 MG tablet Take 150 mg by mouth once. Take one tablet by mouth once a day       . DISCONTD: cephALEXin (KEFLEX) 500 MG capsule Take 500 mg by mouth 2 (two) times daily. Take one capsule by mouth two times a day       . DISCONTD: ferrous sulfate 325 (65 FE) MG tablet Take 325 mg by mouth 2 (two) times daily. Take one tablet by mouth twice daily         . DISCONTD: insulin glargine (LANTUS SOLOSTAR) 100 UNIT/ML injection Inject 25 Units into the skin at bedtime. 25 units at bedtime         Allergies  Allergen Reactions  . Glimepiride   . Glipizide   . Rosiglitazone Maleate       Past Medical History  Diagnosis Date  . Chronic back pain 2007    disabled   . Diabetes mellitus 1991  . Hypertension 1980  . Obesity   .  Hydrocephalus     as child; treated with VP shunt and then intracranial tumor resection     Past Surgical History  Procedure Date  . Ventriculoperitoneal shunt 1998    at [facility]  . Intracranial mass resected 1998    at [facility]; ? location-right middle ear  . Hemorrhoidectomy   . Trigger finger release     right thumb  . Tubal ligation 1980     Family History  Problem Relation Age of Onset  . Cancer Mother     lung   . Hypertension Mother 72  . Kidney failure Brother   . Diabetes Sister   . Diabetes Brother   . Hypertension Brother      History   Social History  . Marital Status: Married    Spouse Name: N/A    Number of Children: 3  . Years of Education: N/A   Occupational History  . disabled     Social History Main Topics  . Smoking status: Former Smoker -- 0.5 packs/day for 15 years    Types: Cigarettes    Quit date: 07/07/2001  . Smokeless tobacco: Never Used  . Alcohol Use: No  . Drug Use: No  . Sexually  Active: Not on file   Other Topics Concern  . Not on file   Social History Narrative   Mother of 3 - all children are deceased     ROS: occasional lightheadedness; requires corrective lenses; told of early cataracts; upper and lower dentures; gastroesophageal reflux disease symptoms; all other systems reviewed and are negative.  PHYSICAL EXAM: BP 139/73  Pulse 94  Ht 5\' 1"  (1.549 m)  Wt 166 lb (75.297 kg)  BMI 31.37 kg/m2  SpO2 98%  General-Well-developed; no acute distress HEENT-[place]/AT; PERRL; EOM intact; conjunctiva and lids nl Neck-No JVD; modest bilateral carotid bruits; surgical scar over her right neck extending behind the lower right ear Endocrine-Mild thyromegaly Lungs-No tachypnea, clear without rales, rhonchi or wheezes Cardiovascular- normal PMI; normal S1 and S2 Abdomen-BS normal; soft and non-tender without masses or organomegaly Musculoskeletal-No deformities, cyanosis or clubbing Neurologic-Nl cranial nerves; symmetric strength and tone Skin- Warm, no sig. lesions Extremities-Nl distal pulses; trace edema  EKG:   Normal sinus rhythm; prominent QRS voltage; J-point elevation; delayed R-wave progression; no previous tracing for comparison.  ASSESSMENT AND PLAN:

## 2010-07-08 NOTE — Assessment & Plan Note (Signed)
Christine Dominguez has had fairly good control of blood pressure with some low values associated with symptoms in recent weeks.  This may reflect the recent addition of clonidine in a single nightly dosage or weight loss resulting in more effective blood pressure lowering by her current medical regime.  Amlodipine dosage will be decreased to 5 mg q.d.  Although she is not orthostatic, her diuretic dose will be decreased by 50%.  Clonidine effect will be more even if the drug is taken on a b.i.d basis.  Patient will collect additional blood pressures at home at various times during the day and night and return in one month for reassessment.

## 2010-07-08 NOTE — Patient Instructions (Signed)
Your physician recommends that you schedule a follow-up appointment in: 1 MONTH Your physician has recommended you make the following change in your medication:DECREASE AMLODIPINE TO 5MG  DAILY, CHANGE  CLONIDINE TO 0.1MG  TWICE DAILY,L DECREASE MAXZIDE  TO 1/2 TABLET DAILY Your physician has requested that you regularly monitor and record your blood pressure readings at home. Please use the same machine at the same time of day to check your readings and record them to bring to your follow-up visit.  LOW SALT DIET- IF DIZZY AND BP LOW EXTRA SALT- BOULLION, SOUP ETC.

## 2010-07-13 ENCOUNTER — Ambulatory Visit (HOSPITAL_COMMUNITY)
Admission: RE | Admit: 2010-07-13 | Discharge: 2010-07-13 | Disposition: A | Discharge status: Home / Self Care | Payer: Medicare HMO | Source: Ambulatory Visit | Attending: Family Medicine | Admitting: Family Medicine

## 2010-07-13 DIAGNOSIS — Z1231 Encounter for screening mammogram for malignant neoplasm of breast: Secondary | ICD-10-CM | POA: Insufficient documentation

## 2010-07-13 DIAGNOSIS — Z139 Encounter for screening, unspecified: Secondary | ICD-10-CM

## 2010-07-14 ENCOUNTER — Telehealth: Payer: Self-pay | Admitting: Family Medicine

## 2010-07-14 NOTE — Telephone Encounter (Signed)
pls advise and erx penicillin 500mg  one 3 tmes daily for 1 week only

## 2010-07-14 NOTE — Op Note (Signed)
  NAME:  Christine Dominguez, Christine Dominguez     ACCOUNT NO.:  [id number]  MEDICAL RECORD NO.:  [id number]           PATIENT TYPE:  O  LOCATION:  DAYP                          FACILITY:  [facility]  PHYSICIAN:  R. Roetta Sessions, M.D. DATE OF BIRTH:  08/07/1950  DATE OF PROCEDURE:  06/30/2010 DATE OF DISCHARGE:                              OPERATIVE REPORT   INDICATIONS FOR PROCEDURE:  A 60 year old lady who comes for her first ever screening colonoscopy in referral from Dr. Syliva Overman. Risks, benefits, limitations, alternatives, and imponderables have been discussed, questions answered.  Please see the documentation for the medical record.  PROCEDURE NOTE:  O2 saturation, blood pressure, pulse, and respirations were monitored throughout the entirety of the procedure.  CONSCIOUS SEDATION:  Versed 7 mg IV, Demerol 50 mg IV in divided doses.  INSTRUMENT:  Pentax video chip system.  FINDINGS:  Digital rectal exam revealed no abnormalities.  Endoscopic findings:  Prep was adequate.  Colon:  Colonic mucosa was surveyed from the rectosigmoid junction through the left transverse, right colon to the appendiceal orifice, ileocecal valve/cecum.  These structures were well seen and photographed for the record.  From this level, the scope was slowly withdrawn.  All previously mentioned mucosal surfaces were again seen.  The patient was noted to have scattered left-sided diverticula.  The patient had elongated tortuous colon which required changing the patient's position and external abdominal pressure to reach the cecum.  The scope was pulled down in to the rectum where thorough examination of the rectal mucosa including retroflexed view of the anal verge demonstrated a couple of anal papillae only.  The patient tolerated the procedure well.  Cecal withdrawal time 10 minutes.  IMPRESSION:  Anal papillae, otherwise normal rectum, somewhat long redundant tortuous colon, left-sided diverticula.  Remaining  colonic mucosa appeared normal.  RECOMMENDATIONS: 1. Diverticulosis literature provided to [name]. 2. Recommended repeat screening colonoscopy in 10 years.     Jonathon Bellows, M.D.     RMR/MEDQ  D:  06/30/2010  T:  06/30/2010  Job:  [id number]  cc:   Milus Mallick. Lodema Hong, M.D. Fax: [phone]  Electronically Signed by Lorrin Goodell M.D. on 07/14/2010 02:57:01 PM

## 2010-07-15 ENCOUNTER — Other Ambulatory Visit: Payer: Self-pay

## 2010-07-15 DIAGNOSIS — K047 Periapical abscess without sinus: Secondary | ICD-10-CM

## 2010-07-15 MED ORDER — PENICILLIN V POTASSIUM 500 MG PO TABS: 500.0000 mg | ORAL_TABLET | Freq: Three times a day (TID) | ORAL | Status: AC

## 2010-07-15 NOTE — Telephone Encounter (Signed)
Med sent and patient aware 

## 2010-07-16 ENCOUNTER — Encounter: Payer: Self-pay | Admitting: Cardiology

## 2010-07-17 ENCOUNTER — Telehealth: Payer: Self-pay | Admitting: Cardiology

## 2010-07-17 NOTE — Telephone Encounter (Signed)
Patient needs new dosage of Maxide sent to [facility]/tg

## 2010-07-18 MED ORDER — TRIAMTERENE-HCTZ 37.5-25 MG PO TABS: 0.5000 | ORAL_TABLET | Freq: Every day | ORAL | Status: DC

## 2010-07-20 ENCOUNTER — Other Ambulatory Visit: Payer: Self-pay

## 2010-07-20 MED ORDER — TRIAMTERENE-HCTZ 37.5-25 MG PO TABS: 0.5000 | ORAL_TABLET | Freq: Every day | ORAL | Status: DC

## 2010-08-10 ENCOUNTER — Ambulatory Visit (INDEPENDENT_AMBULATORY_CARE_PROVIDER_SITE_OTHER): Payer: Medicare HMO | Admitting: Cardiology

## 2010-08-10 ENCOUNTER — Encounter: Payer: Self-pay | Admitting: Cardiology

## 2010-08-10 ENCOUNTER — Encounter: Payer: Self-pay | Admitting: *Deleted

## 2010-08-10 VITALS — BP 136/79 | HR 83 | Ht 61.0 in | Wt 166.0 lb

## 2010-08-10 DIAGNOSIS — I10 Essential (primary) hypertension: Secondary | ICD-10-CM

## 2010-08-10 MED ORDER — VALSARTAN-HYDROCHLOROTHIAZIDE 320-12.5 MG PO TABS: 1.0000 | ORAL_TABLET | Freq: Every day | ORAL | Status: DC

## 2010-08-10 NOTE — Patient Instructions (Addendum)
Your physician has recommended you make the following change in your medication: Stop taking Diovan when you finish current bottle and start taking Diova/HCT 320/12.5mg  daily. Also, stoptaking Triamterene/HCT (Maxide)  Your physician recommends that you schedule a follow-up appointment in: 6 months  Please call this office at [phone] if you have any blood pressure issues   Your physician recommends that you return for lab work in: 10 WEEKS

## 2010-08-10 NOTE — Progress Notes (Signed)
HPI : [name] returns to the office for continued management of hypertension.  Since her last visit, she has done extremely well.  She has not noted episodes of weakness or dizziness.  She has assiduously measured blood pressure at home with excellent values 95% of the time.  One day, she noted tachycardia and systolics around 150-160 mmHg.  Current Outpatient Prescriptions on File Prior to Visit  Medication Sig Dispense Refill  . Alcohol Swabs (B-D SINGLE USE SWABS REGULAR) PADS       . amLODipine (NORVASC) 5 MG tablet Take 1 tablet (5 mg total) by mouth daily.  90 tablet  3  . aspirin 81 MG tablet Take 81 mg by mouth daily.        . Cinnamon 500 MG capsule Take 500 mg by mouth 2 (two) times daily.        . cloNIDine (CATAPRES) 0.1 MG tablet Take 1 tablet (0.1 mg total) by mouth 2 (two) times daily.  180 tablet  3  . clotrimazole-betamethasone (LOTRISONE) cream Apply topically 2 (two) times daily. Apply twice a day fir 10 days to rash , then as needed       . cyclobenzaprine (FLEXERIL) 10 MG tablet Take 10 mg by mouth as needed.       . docusate sodium (COLACE) 100 MG capsule Take 100 mg by mouth as needed.        . gabapentin (NEURONTIN) 100 MG capsule Take 100 mg by mouth 2 (two) times daily.       Marland Kitchen glucose blood (ACCU-CHEK AVIVA) test strip 1 each by Other route 3 (three) times daily. Three times a day testing [id number]:[id number]       . Iron 66 MG TABS Take 1 tablet by mouth daily.        Marland Kitchen ketoconazole (NIZORAL) 2 % cream Apply topically daily. Apply to affected area as needed       . metFORMIN (GLUCOPHAGE) 500 MG tablet Take 500 mg by mouth daily.       Marland Kitchen nystatin (NYSTOP) 100000 UNIT/GM POWD Apply topically. Apply twice daily to affected area fr 2 weeks, then as needed for rash      . omeprazole (PRILOSEC) 20 MG capsule Take 20 mg by mouth daily. Take one tablet by mouth once a day       . DISCONTD: triamterene-hydrochlorothiazide (MAXZIDE-25) 37.5-25 MG per tablet Take 0.5 tablets by  mouth daily.  45 tablet  3  . DISCONTD: valsartan (DIOVAN) 320 MG tablet Take 320 mg by mouth daily. take one tablet by mouth once daily        . valsartan-hydrochlorothiazide (DIOVAN-HCT) 320-12.5 MG per tablet Take 1 tablet by mouth daily.  90 tablet  1  . DISCONTD: fluconazole (DIFLUCAN) 150 MG tablet Take 150 mg by mouth once. Take one tablet by mouth once a day          Allergies  Allergen Reactions  . Glimepiride   . Glipizide   . Rosiglitazone Maleate       Past medical history, social history, and family history reviewed and updated.  ROS: No chest pain, dyspnea on exertion, lightheadedness or syncope.  PHYSICAL EXAM: BP 136/79  Pulse 83  Ht 5\' 1"  (1.549 m)  Wt 166 lb (75.297 kg)  BMI 31.37 kg/m2  SpO2 96%  General-Well developed; no acute distress Body habitus-proportionate weight and height Neck-No JVD, soft bilateral carotid bruits Lungs: clear lung fields; normal I:E ratio Cardiovascular-normal PMI; normal S1 and S2  Abdomen-normal bowel sounds; soft and non-tender without masses or organomegaly Skin-Warm, no significant lesions Extremities-Nl distal pulses; no edema  ASSESSMENT AND PLAN:

## 2010-08-10 NOTE — Assessment & Plan Note (Signed)
Blood pressure control is excellent with no apparent adverse reactions to current medications and no symptomatic hypotension.  Patient is concerned about the number of pills that she takes.  Once her current valsartan prescription has been exhausted, she was switched to valsartan HCT 320/12.5 mg q.d and stop hydrochlorothiazide/triamterene.  Electrolytes will be checked in 2 months thereafter.  I will see this nice woman again in 6 months after which she probably will not need routine cardiology visits.

## 2010-08-17 ENCOUNTER — Encounter: Payer: Self-pay | Admitting: Family Medicine

## 2010-08-24 ENCOUNTER — Other Ambulatory Visit: Payer: Self-pay | Admitting: Family Medicine

## 2010-08-24 LAB — BASIC METABOLIC PANEL
CO2: 24 mEq/L (ref 19–32)
Calcium: 9.8 mg/dL (ref 8.4–10.5)
Glucose, Bld: 142 mg/dL — ABNORMAL HIGH (ref 70–99)
Potassium: 4.5 mEq/L (ref 3.5–5.3)
Sodium: 140 mEq/L (ref 135–145)

## 2010-08-25 ENCOUNTER — Encounter: Payer: Self-pay | Admitting: Family Medicine

## 2010-08-25 ENCOUNTER — Other Ambulatory Visit: Payer: Self-pay | Admitting: Family Medicine

## 2010-08-25 ENCOUNTER — Ambulatory Visit (INDEPENDENT_AMBULATORY_CARE_PROVIDER_SITE_OTHER): Payer: Medicare HMO | Admitting: Family Medicine

## 2010-08-25 ENCOUNTER — Ambulatory Visit (HOSPITAL_COMMUNITY)
Admission: RE | Admit: 2010-08-25 | Discharge: 2010-08-25 | Disposition: A | Discharge status: Home / Self Care | Payer: Medicare HMO | Source: Ambulatory Visit | Attending: Family Medicine | Admitting: Family Medicine

## 2010-08-25 VITALS — BP 122/72 | HR 76 | Resp 16 | Ht 61.5 in | Wt 166.0 lb

## 2010-08-25 DIAGNOSIS — M51379 Other intervertebral disc degeneration, lumbosacral region without mention of lumbar back pain or lower extremity pain: Secondary | ICD-10-CM | POA: Insufficient documentation

## 2010-08-25 DIAGNOSIS — M5137 Other intervertebral disc degeneration, lumbosacral region: Secondary | ICD-10-CM | POA: Insufficient documentation

## 2010-08-25 DIAGNOSIS — M545 Low back pain, unspecified: Secondary | ICD-10-CM | POA: Insufficient documentation

## 2010-08-25 DIAGNOSIS — M549 Dorsalgia, unspecified: Secondary | ICD-10-CM

## 2010-08-25 DIAGNOSIS — M79609 Pain in unspecified limb: Secondary | ICD-10-CM | POA: Insufficient documentation

## 2010-08-25 DIAGNOSIS — I10 Essential (primary) hypertension: Secondary | ICD-10-CM

## 2010-08-25 DIAGNOSIS — E119 Type 2 diabetes mellitus without complications: Secondary | ICD-10-CM

## 2010-08-25 LAB — HEMOGLOBIN A1C: Hgb A1c MFr Bld: 8.1 % — ABNORMAL HIGH (ref <5.7)

## 2010-08-25 MED ORDER — AMLODIPINE BESYLATE 5 MG PO TABS: 5.0000 mg | ORAL_TABLET | Freq: Every day | ORAL | Status: DC

## 2010-08-25 MED ORDER — VALSARTAN-HYDROCHLOROTHIAZIDE 320-12.5 MG PO TABS: 1.0000 | ORAL_TABLET | Freq: Every day | ORAL | Status: DC

## 2010-08-25 MED ORDER — GLIPIZIDE ER 5 MG PO TB24: 5.0000 mg | ORAL_TABLET | Freq: Every day | ORAL | Status: DC

## 2010-08-25 MED ORDER — CYCLOBENZAPRINE HCL 10 MG PO TABS: 10.0000 mg | ORAL_TABLET | Freq: Every evening | ORAL | Status: DC | PRN

## 2010-08-25 MED ORDER — CLONIDINE HCL 0.1 MG PO TABS: 0.1000 mg | ORAL_TABLET | Freq: Two times a day (BID) | ORAL | Status: DC

## 2010-08-25 MED ORDER — KETOROLAC TROMETHAMINE 30 MG/ML IJ SOLN
60.0000 mg | Freq: Once | INTRAMUSCULAR | Status: AC
  Administered 2010-08-25: 60 mg via INTRAMUSCULAR

## 2010-08-25 MED ORDER — GABAPENTIN 100 MG PO CAPS: 100.0000 mg | ORAL_CAPSULE | Freq: Two times a day (BID) | ORAL | Status: DC

## 2010-08-25 MED ORDER — MELOXICAM 7.5 MG PO TABS: 7.5000 mg | ORAL_TABLET | Freq: Every day | ORAL | Status: DC

## 2010-08-25 NOTE — Assessment & Plan Note (Addendum)
Deteriorated toradol administered and pt referred to PT x 6 weeks

## 2010-08-25 NOTE — Patient Instructions (Signed)
F/u in 3 months. Stop the metformin, new med for diabetes is glipizide.  New med for back pain and physical therapy, also x rays.  Test sugars once daily and pls go to class

## 2010-08-27 ENCOUNTER — Telehealth: Payer: Self-pay | Admitting: Family Medicine

## 2010-08-28 ENCOUNTER — Other Ambulatory Visit: Payer: Self-pay

## 2010-08-28 DIAGNOSIS — E119 Type 2 diabetes mellitus without complications: Secondary | ICD-10-CM

## 2010-08-28 MED ORDER — GLIPIZIDE ER 5 MG PO TB24: 5.0000 mg | ORAL_TABLET | Freq: Every day | ORAL | Status: DC

## 2010-08-28 NOTE — Telephone Encounter (Signed)
Patient aware they were sent to [facility]

## 2010-08-30 ENCOUNTER — Encounter: Payer: Self-pay | Admitting: Family Medicine

## 2010-08-30 NOTE — Progress Notes (Signed)
  Subjective:    Patient ID: Christine Dominguez, female    DOB: December 24, 1950, 60 y.o.   MRN: [id number]  HPI The PT is here for follow up and re-evaluation of chronic medical conditions, medication management and review of recent lab and radiology data.  Preventive health is updated, specifically  Cancer screening, Osteoporosis screening and Immunization.   Questions or concerns regarding consultations or procedures which the PT has had in the interim are  addressed. The PT denies any adverse reactions to current medications since the last visit.  There are no new concerns.  Reports uncontrolled back pain, has been to chiropracter in the past, denies lower ext weakness or numbness, or inconmtinence    Review of Systems See HPI Denies recent fever or chills. Denies sinus pressure, nasal congestion, ear pain or sore throat. Denies chest congestion, productive cough or wheezing. Denies chest pains, palpitations, paroxysmal nocturnal dyspnea, orthopnea and leg swelling Denies abdominal pain, nausea, vomiting,diarrhea or constipation.  Denies rectal bleeding or change in bowel movement. Denies dysuria, frequency, hesitancy or incontinence.  Denies headaches, seizure, numbness, or tingling. Denies depression, anxiety or insomnia. Denies skin break down or rash. Denies hypoglycemic episodes, blood sugars range from 110 to 40's in the morning. Physical activity is lmiited, and dietary compliance varies        Objective:   Physical Exam    Patient alert and oriented and in no Cardiopulmonary distress.  HEENT: No facial asymmetry, EOMI, no sinus tenderness, TM's clear, Oropharynx pink and moist.  Neck supple no adenopathy.  Chest: Clear to auscultation bilaterally.  CVS: S1, S2 no murmurs, no S3.  ABD: Soft non tender. Bowel sounds normal.  Ext: No edema  MS: decreasd  ROM spine, shoulders, hips and knees.  Skin: Intact, no ulcerations or rash noted.  Psych: Good eye  contact, normal affect. Memory intact not anxious or depressed appearing.  CNS: CN 2-12 intact, power,  normal throughout. Diabetic Foot Check:  Appearance - no lesions, ulcers or calluses Skin - no unusual pallor or redness Sensation - grossly intact to light touch Monofilament testing -  Right - Great toe, medial, central, lateral ball and posterior foot decreased  Left - Great toe, medial, central, lateral ball and posterior foot decreased Pulses Left - Dorsalis Pedis and Posterior Tibia normal Right - Dorsalis Pedis and Posterior Tibia normal      Assessment & Plan:

## 2010-08-30 NOTE — Assessment & Plan Note (Signed)
Deteriorated, will have to change med, pt when questioned about stated allergy to glipizide could not recall what this was, may have been as trivial a GI, denied any rash or wheeze, anxious to try the med again. Need for re-ed about diet stressed, she promises to attend a class

## 2010-08-30 NOTE — Assessment & Plan Note (Signed)
Improved, and controlled, still reports low readings at times, however this is now being followed by cardiology.Last report from card was excellent

## 2010-09-03 ENCOUNTER — Other Ambulatory Visit: Payer: Self-pay | Admitting: *Deleted

## 2010-09-03 ENCOUNTER — Telehealth: Payer: Self-pay | Admitting: Family Medicine

## 2010-09-03 DIAGNOSIS — E119 Type 2 diabetes mellitus without complications: Secondary | ICD-10-CM

## 2010-09-03 MED ORDER — GLIPIZIDE ER 5 MG PO TB24: 5.0000 mg | ORAL_TABLET | Freq: Every day | ORAL | Status: DC

## 2010-09-04 NOTE — Telephone Encounter (Signed)
Med was resent

## 2010-09-16 ENCOUNTER — Telehealth: Payer: Self-pay | Admitting: Family Medicine

## 2010-09-16 DIAGNOSIS — M549 Dorsalgia, unspecified: Secondary | ICD-10-CM

## 2010-09-16 MED ORDER — GABAPENTIN 100 MG PO CAPS: 100.0000 mg | ORAL_CAPSULE | Freq: Two times a day (BID) | ORAL | Status: DC

## 2010-09-16 NOTE — Telephone Encounter (Signed)
Needs gabapentin [facility] to [facility]. Resent med

## 2010-09-22 ENCOUNTER — Ambulatory Visit (HOSPITAL_COMMUNITY)
Admission: RE | Admit: 2010-09-22 | Discharge: 2010-09-22 | Disposition: A | Discharge status: Home / Self Care | Payer: Medicare HMO | Source: Ambulatory Visit | Attending: Family Medicine | Admitting: Family Medicine

## 2010-09-22 DIAGNOSIS — M545 Low back pain, unspecified: Secondary | ICD-10-CM | POA: Insufficient documentation

## 2010-09-22 DIAGNOSIS — IMO0002 Reserved for concepts with insufficient information to code with codable children: Secondary | ICD-10-CM | POA: Insufficient documentation

## 2010-09-22 DIAGNOSIS — R262 Difficulty in walking, not elsewhere classified: Secondary | ICD-10-CM | POA: Insufficient documentation

## 2010-09-22 DIAGNOSIS — M546 Pain in thoracic spine: Secondary | ICD-10-CM | POA: Insufficient documentation

## 2010-09-22 DIAGNOSIS — M6281 Muscle weakness (generalized): Secondary | ICD-10-CM | POA: Insufficient documentation

## 2010-09-22 DIAGNOSIS — IMO0001 Reserved for inherently not codable concepts without codable children: Secondary | ICD-10-CM | POA: Insufficient documentation

## 2010-09-24 ENCOUNTER — Ambulatory Visit (HOSPITAL_COMMUNITY)
Admission: RE | Admit: 2010-09-24 | Discharge: 2010-09-24 | Disposition: A | Discharge status: Home / Self Care | Payer: Medicare HMO | Source: Ambulatory Visit | Attending: Family Medicine | Admitting: Family Medicine

## 2010-09-29 ENCOUNTER — Ambulatory Visit (HOSPITAL_COMMUNITY): Payer: Medicare HMO

## 2010-10-01 ENCOUNTER — Ambulatory Visit (HOSPITAL_COMMUNITY)
Admission: RE | Admit: 2010-10-01 | Discharge: 2010-10-01 | Disposition: A | Discharge status: Home / Self Care | Payer: Medicare HMO | Source: Ambulatory Visit | Attending: Family Medicine | Admitting: Family Medicine

## 2010-10-01 ENCOUNTER — Telehealth: Payer: Self-pay | Admitting: Family Medicine

## 2010-10-01 ENCOUNTER — Other Ambulatory Visit: Payer: Self-pay | Admitting: Family Medicine

## 2010-10-01 DIAGNOSIS — M549 Dorsalgia, unspecified: Secondary | ICD-10-CM

## 2010-10-01 MED ORDER — CYCLOBENZAPRINE HCL 10 MG PO TABS: 10.0000 mg | ORAL_TABLET | Freq: Every evening | ORAL | Status: AC | PRN

## 2010-10-01 MED ORDER — GABAPENTIN 100 MG PO CAPS: 100.0000 mg | ORAL_CAPSULE | Freq: Two times a day (BID) | ORAL | Status: DC

## 2010-10-01 MED ORDER — CYCLOBENZAPRINE HCL 10 MG PO TABS: 10.0000 mg | ORAL_TABLET | Freq: Every evening | ORAL | Status: DC | PRN

## 2010-10-01 NOTE — Telephone Encounter (Signed)
Med sent in as requested. Patient aware

## 2010-10-05 ENCOUNTER — Ambulatory Visit (HOSPITAL_COMMUNITY)
Admission: RE | Admit: 2010-10-05 | Discharge: 2010-10-05 | Disposition: A | Discharge status: Home / Self Care | Payer: Medicare HMO | Source: Ambulatory Visit | Attending: Family Medicine | Admitting: Family Medicine

## 2010-10-05 DIAGNOSIS — M545 Low back pain, unspecified: Secondary | ICD-10-CM | POA: Insufficient documentation

## 2010-10-05 DIAGNOSIS — R262 Difficulty in walking, not elsewhere classified: Secondary | ICD-10-CM | POA: Insufficient documentation

## 2010-10-05 DIAGNOSIS — IMO0002 Reserved for concepts with insufficient information to code with codable children: Secondary | ICD-10-CM | POA: Insufficient documentation

## 2010-10-05 DIAGNOSIS — IMO0001 Reserved for inherently not codable concepts without codable children: Secondary | ICD-10-CM | POA: Insufficient documentation

## 2010-10-05 DIAGNOSIS — M6281 Muscle weakness (generalized): Secondary | ICD-10-CM | POA: Insufficient documentation

## 2010-10-05 DIAGNOSIS — M546 Pain in thoracic spine: Secondary | ICD-10-CM | POA: Insufficient documentation

## 2010-10-08 ENCOUNTER — Ambulatory Visit (HOSPITAL_COMMUNITY)
Admission: RE | Admit: 2010-10-08 | Discharge: 2010-10-08 | Disposition: A | Discharge status: Home / Self Care | Payer: Medicare HMO | Source: Ambulatory Visit | Attending: Family Medicine | Admitting: Family Medicine

## 2010-10-13 ENCOUNTER — Ambulatory Visit (HOSPITAL_COMMUNITY)
Admission: RE | Admit: 2010-10-13 | Discharge: 2010-10-13 | Disposition: A | Discharge status: Home / Self Care | Payer: Medicare HMO | Source: Ambulatory Visit | Attending: Family Medicine | Admitting: Family Medicine

## 2010-10-13 NOTE — Progress Notes (Signed)
Physical Therapy Treatment Patient Name: Christine Dominguez Date: 10/13/2010  Visit #: 6/7  Time In: 9:04 Time Out: 9:33  Subjective: "Not hurting today." 0/10 Pain. Pt reports HEP Compliance.          Exercise/Treatments @FLOW ([id number])@  Goals PT Short Term Goals Short Term Goal 1: Decrease pain level to a 2 or 3. Long Term Goal 1 Progress: Progressing toward goal Short Term Goal 2: Pt able to sleep at night. Long Term Goal 2 Progress: Progressing toward goal Short Term Goal 3: Able to walk x 30 minutes. Long Term Goal 3 Progress: Progressing toward goal Short Term Goal 4: Able to sit 90 miutes. Long Term Goal 4 Progress: Progressing toward goal End of Session Patient Active Problem List  Diagnoses  . OBESITY, UNSPECIFIED  . CARPAL TUNNEL SYNDROME  . HYPERTENSION  . BACK PAIN  . DIABETES MELLITUS, TYPE II   PT - End of Session Activity Tolerance: Patient tolerated treatment well  Assessment: Pt requires VCs and TCs for proper form with functional squats and scapular tband ex. Pt completes supine/prone ex without difficulty.   Plan: Continue with PT POC.  Seth Bake [facility] 10/13/2010, 9:39 AM

## 2010-10-14 LAB — BASIC METABOLIC PANEL
Calcium: 10 mg/dL (ref 8.4–10.5)
Glucose, Bld: 152 mg/dL — ABNORMAL HIGH (ref 70–99)
Potassium: 4.6 mEq/L (ref 3.5–5.3)
Sodium: 141 mEq/L (ref 135–145)

## 2010-10-15 ENCOUNTER — Encounter: Payer: Self-pay | Admitting: Cardiology

## 2010-10-16 ENCOUNTER — Ambulatory Visit (HOSPITAL_COMMUNITY): Payer: Medicare HMO

## 2010-10-19 ENCOUNTER — Other Ambulatory Visit: Payer: Self-pay | Admitting: Family Medicine

## 2010-10-20 ENCOUNTER — Ambulatory Visit (HOSPITAL_COMMUNITY)
Admission: RE | Admit: 2010-10-20 | Discharge: 2010-10-20 | Disposition: A | Discharge status: Home / Self Care | Payer: Medicare HMO | Source: Ambulatory Visit | Attending: Family Medicine | Admitting: Family Medicine

## 2010-10-20 NOTE — Progress Notes (Addendum)
Physical Therapy Treatment Patient Name: Christine Dominguez Date: 10/20/2010  Initial Evaluation Date: 09-22-10 Visit #: 7/8  HPI: Symptoms/Limitations Symptoms: Stiff from packing chow chow this weekend. Not in pain though. Pain Assessment Currently in Pain?: No/denies    Exercise/Treatments Cervical Exercises Shoulder Extension: Strengthening;Both;15 reps;Standing;Theraband Theraband Level (Shoulder Extension): Level 3 (Green) Row: Strengthening;Both;15 reps;Theraband;Standing Theraband Level (Row): Level 3 (Green) Scapular Retraction: Strengthening;Both;15 reps;Theraband Theraband Level (Scapular Retraction): Level 3 (Green) Lumbar Stretches Active Hamstring Stretch:  (HEP) Lumbar Exercises Scapular Retraction: Strengthening;Both;15 reps;Theraband Theraband Level (Scapular Retraction): Level 3 (Green) Row: Strengthening;Both;15 reps;Theraband;Standing Theraband Level (Row): Level 3 (Green) Shoulder Extension: Strengthening;Both;15 reps;Standing;Theraband Theraband Level (Shoulder Extension): Level 3 (Green) Stability Exercises Clam: 15 reps;Side-lying (Bilateral) Bridge: 15 reps;5 seconds;Supine Bent Knee Raise: 10 reps;Supine (Bilateral) Heel Squeeze: 10 reps;5 seconds;Prone Leg Raise: 10 reps;Single;Right;Left;Prone (Done in prone and supine) Opposite Arm/Leg Raise: 5 reps;Prone;Left arm/Right leg;Right arm/Left leg Functional Squats: 15 reps Forward Lunge: 10 reps (Bilateral) Hip Stretches Active Hamstring Stretch:  (HEP) Additional Hip Exercises Forward Lunge: 10 reps (Bilateral) Knee Stretches Active Hamstring Stretch:  (HEP) Additional Knee Exercises Forward Lunge: 10 reps (Bilateral)    (exercises only completed once, repeated exercise is computer error) Goals PT Short Term Goals Short Term Goal 1 Progress: Met Short Term Goal 2 Progress: Met Short Term Goal 3 Progress: Progressing toward goal Short Term Goal 4 Progress: Progressing  toward goal End of Session Patient Active Problem List  Diagnoses  . OBESITY, UNSPECIFIED  . CARPAL TUNNEL SYNDROME  . HYPERTENSION  . BACK PAIN  . DIABETES MELLITUS, TYPE II   PT - End of Session Activity Tolerance: Patient tolerated treatment well General Behavior During Session: [facility] for tasks performed Cognition: [facility] for tasks performed PT Assessment and Plan Clinical Impression Statement: Pt completes theres without difficulty. Pt is no longer limited by pain. PT Treatment/Interventions: Therapeutic exercise PT Plan: Reassessl next tx.   Seth Bake [facility] 10/20/2010, 9:41 AM

## 2010-10-22 ENCOUNTER — Other Ambulatory Visit: Payer: Self-pay | Admitting: *Deleted

## 2010-10-22 ENCOUNTER — Ambulatory Visit (HOSPITAL_COMMUNITY): Payer: Medicare HMO | Admitting: Physical Therapy

## 2010-10-22 DIAGNOSIS — I10 Essential (primary) hypertension: Secondary | ICD-10-CM

## 2010-10-22 MED ORDER — VALSARTAN-HYDROCHLOROTHIAZIDE 320-12.5 MG PO TABS: 1.0000 | ORAL_TABLET | Freq: Every day | ORAL | Status: DC

## 2010-10-22 MED ORDER — LOSARTAN POTASSIUM-HCTZ 100-12.5 MG PO TABS: 1.0000 | ORAL_TABLET | Freq: Every day | ORAL | Status: DC

## 2010-10-23 ENCOUNTER — Telehealth: Payer: Self-pay | Admitting: Family Medicine

## 2010-10-23 MED ORDER — PENICILLIN V POTASSIUM 500 MG PO TABS: 500.0000 mg | ORAL_TABLET | Freq: Three times a day (TID) | ORAL | Status: AC

## 2010-10-23 MED ORDER — FLUCONAZOLE 150 MG PO TABS: 150.0000 mg | ORAL_TABLET | Freq: Once | ORAL | Status: AC

## 2010-10-23 NOTE — Telephone Encounter (Signed)
pls erx penicillin 500mg  one 3 times daily #21 only no refills, also if she needs fluconazole 150mg   One daily as needed for vag itch #3 only.  pls advise her that since this is a dental prob , she should get the antibiotic prescribed by the dentist

## 2010-10-23 NOTE — Telephone Encounter (Signed)
[facility] SENT, CALLED PATIENT, LEFT DETAILED VOICEMAIL

## 2010-10-23 NOTE — Telephone Encounter (Signed)
Addended by: Adella Hare B on: 10/23/2010 04:21 PM   Modules accepted: Orders

## 2010-10-24 ENCOUNTER — Telehealth: Payer: Self-pay | Admitting: Family Medicine

## 2010-10-24 ENCOUNTER — Other Ambulatory Visit: Payer: Self-pay | Admitting: Family Medicine

## 2010-10-24 MED ORDER — INSULIN GLARGINE 100 UNIT/ML ~~LOC~~ SOLN: 10.0000 [IU] | Freq: Every day | SUBCUTANEOUS | Status: DC

## 2010-10-24 NOTE — Telephone Encounter (Signed)
Pt calls stating can;t take glipizide, will start her on lantus 10 units daily by script, she will start 7 units and increase to 10 units after 1 week if her avg fasting sugars are over 130, she already has pens at home and wants script at [facility]. sTATES GLIPIZIDE IS CAUSING INCREASED REFLUX SYMPTOMS

## 2010-10-27 ENCOUNTER — Ambulatory Visit (HOSPITAL_COMMUNITY)
Admission: RE | Admit: 2010-10-27 | Discharge: 2010-10-27 | Disposition: A | Discharge status: Home / Self Care | Payer: Medicare HMO | Source: Ambulatory Visit | Attending: Family Medicine | Admitting: Family Medicine

## 2010-10-27 NOTE — Progress Notes (Signed)
Physical Therapy Treatment Patient Name: Christine Dominguez [id number] Date: 10/27/2010  Initial Evaluation Date: 09-22-10  Visit #: 8/9   HPI: Symptoms/Limitations Symptoms: feeling good today; just minor c/o of leg stiffness Pain Assessment Currently in Pain?:  (denies no pain)  Subjective: Pt reports she is no longer having any pain. Pt c/o of some stiffness in LE but it is tolerable.   Objective: LLE strength- Hip flexion 4+/5; Hip abd 5/5; Hip ext 4+/5; Knee flexion 5/5/ All other LE mm WNL. ROM: [facility]  Exercise/Treatments Cervical Exercises Shoulder Extension: Strengthening;Both;15 reps;Standing;Theraband Theraband Level (Shoulder Extension): Level 3 (Green) Row: Strengthening;Both;15 reps;Theraband;Standing Theraband Level (Row): Level 3 (Green) Scapular Retraction: Strengthening;Both;15 reps;Theraband Theraband Level (Scapular Retraction): Level 3 (Green) Lumbar Exercises Scapular Retraction: Strengthening;Both;15 reps;Theraband Theraband Level (Scapular Retraction): Level 3 (Green) Row: Strengthening;Both;15 reps;Theraband;Standing Theraband Level (Row): Level 3 (Green) Shoulder Extension: Strengthening;Both;15 reps;Standing;Theraband Theraband Level (Shoulder Extension): Level 3 (Green) Stability Exercises Heel Squeeze: 15 reps Leg Raise: 10 reps;Single;Right;Left;Prone (Done in prone and supine) Opposite Arm/Leg Raise: 10 reps Functional Squats: 15 reps Forward Lunge: 10 reps (Bilateral) Additional Hip Exercises Forward Lunge: 10 reps (Bilateral) Additional Knee Exercises Forward Lunge: 10 reps (Bilateral) Each exercise competed once; repeated exercises are due to computer error.  Goals PT Short Term Goals Short Term Goal 1 Progress: Met Short Term Goal 2 Progress: Met Short Term Goal 3 Progress: Met Short Term Goal 4 Progress: Met End of Session Patient Active Problem List  Diagnoses  . OBESITY, UNSPECIFIED  . CARPAL TUNNEL SYNDROME  . HYPERTENSION   . BACK PAIN  . DIABETES MELLITUS, TYPE II   PT - End of Session Activity Tolerance: Patient tolerated treatment well General Behavior During Session: [facility] for tasks performed Cognition: [facility] for tasks performed PT Assessment and Plan Clinical Impression Statement: Pt has met all goals. Completes therex without difficulty. HEP given. PT Treatment/Interventions: Therapeutic exercise PT Plan: Suggest possible D/C to PT.  Lula Olszewski, Bryona Foxworthy ATKINSO/ Seth Bake, PTA (Tx carried out) 10/27/2010, 9:48 AM

## 2010-10-28 ENCOUNTER — Telehealth: Payer: Self-pay | Admitting: Family Medicine

## 2010-10-28 NOTE — Telephone Encounter (Signed)
Pt advised to start lantus 20 units ever night , by Monday if avg still above 130 , then go to 25 units. Needs to call back for diabetic class.  Call next Thursday if blood sugars still out of range I spoke with her and she verbalizedunderstanding

## 2010-10-29 ENCOUNTER — Ambulatory Visit (HOSPITAL_COMMUNITY): Payer: Medicare HMO | Admitting: *Deleted

## 2010-11-18 ENCOUNTER — Emergency Department (HOSPITAL_COMMUNITY): Payer: Medicare HMO

## 2010-11-18 ENCOUNTER — Encounter (HOSPITAL_COMMUNITY): Payer: Self-pay

## 2010-11-18 ENCOUNTER — Emergency Department (HOSPITAL_COMMUNITY)
Admission: EM | Admit: 2010-11-18 | Discharge: 2010-11-18 | Disposition: A | Discharge status: Home / Self Care | Payer: Medicare HMO | Attending: Emergency Medicine | Admitting: Emergency Medicine

## 2010-11-18 DIAGNOSIS — Z79899 Other long term (current) drug therapy: Secondary | ICD-10-CM | POA: Insufficient documentation

## 2010-11-18 DIAGNOSIS — R197 Diarrhea, unspecified: Secondary | ICD-10-CM

## 2010-11-18 DIAGNOSIS — F17200 Nicotine dependence, unspecified, uncomplicated: Secondary | ICD-10-CM | POA: Insufficient documentation

## 2010-11-18 DIAGNOSIS — E119 Type 2 diabetes mellitus without complications: Secondary | ICD-10-CM | POA: Insufficient documentation

## 2010-11-18 LAB — CBC
HCT: 32.9 % — ABNORMAL LOW (ref 36.0–46.0)
Hemoglobin: 10.4 g/dL — ABNORMAL LOW (ref 12.0–15.0)
MCV: 83.7 fL (ref 78.0–100.0)
RBC: 3.93 MIL/uL (ref 3.87–5.11)
WBC: 8.3 10*3/uL (ref 4.0–10.5)

## 2010-11-18 LAB — BASIC METABOLIC PANEL
CO2: 28 mEq/L (ref 19–32)
Calcium: 10 mg/dL (ref 8.4–10.5)
Chloride: 102 mEq/L (ref 96–112)
Creatinine, Ser: 1.22 mg/dL — ABNORMAL HIGH (ref 0.50–1.10)
Glucose, Bld: 137 mg/dL — ABNORMAL HIGH (ref 70–99)

## 2010-11-18 LAB — DIFFERENTIAL
Eosinophils Relative: 2 % (ref 0–5)
Lymphocytes Relative: 24 % (ref 12–46)
Lymphs Abs: 2 10*3/uL (ref 0.7–4.0)
Monocytes Absolute: 0.4 10*3/uL (ref 0.1–1.0)

## 2010-11-18 LAB — GLUCOSE, CAPILLARY

## 2010-11-18 MED ORDER — ONDANSETRON HCL 4 MG/2ML IJ SOLN
4.0000 mg | Freq: Once | INTRAMUSCULAR | Status: AC
  Administered 2010-11-18: 4 mg via INTRAVENOUS
  Filled 2010-11-18: qty 2

## 2010-11-18 MED ORDER — SODIUM CHLORIDE 0.9 % IV SOLN: INTRAVENOUS | Status: DC

## 2010-11-18 MED ORDER — SODIUM CHLORIDE 0.9 % IV BOLUS (SEPSIS)
250.0000 mL | Freq: Once | INTRAVENOUS | Status: AC
  Administered 2010-11-18: 1000 mL via INTRAVENOUS
  Administered 2010-11-18: 250 mL via INTRAVENOUS

## 2010-11-18 MED ORDER — LOPERAMIDE HCL 2 MG PO CAPS: 2.0000 mg | ORAL_CAPSULE | Freq: Four times a day (QID) | ORAL | Status: AC | PRN

## 2010-11-18 NOTE — ED Provider Notes (Signed)
History    Scribed for Christine Jakes, MD, the patient was seen in room APA10/APA10. This chart was scribed by [name]. This patient's care was started at 12:48PM.  CSN: [id number] Arrival date & time: 11/18/2010 11:55 AM  Chief Complaint  Patient presents with  . Diarrhea   HPI Patient is a 60 year old female c/o constant moderate nausea and weakness onset this morning and persistent since with associated diarrhea 5x. Patient also reports her blood-glucose level has been running low for her and states she measured her blood glucose level at 110 this morning. Notes she has also been experiencing "the shakes" since her blood sugar has been decreased below her normal levels. Patient explained that a CBG measure of 110 is actually considered high but she insists this measurement is low for her. Denies hematochezia, abdominal pain, vomiting, dysuria, HA, rash, chest pain, SOB. Reports she has an appointment scheduled with Dr. Lodema Hong in 3 days. Patient with h/o hypertension, diabetes, hydrocephalus.     HPI ELEMENTS:   Onset: this morning Duration: persistent since onset  Timing: constant   Severity: moderate   Context:  as above  Associated symptoms: +Diarrhea. Denies hematochezia, abdominal pain, vomiting, dysuria, HA, rash, chest pain, SOB.    PAST MEDICAL HISTORY:  Past Medical History  Diagnosis Date  . Chronic back pain 2007    disabled   . Diabetes mellitus 1991  . Hypertension 1980  . Obesity   . Hydrocephalus     treated with VP shunt and then intracranial tumor resection (?Cholesteatoma); no longer followed actively by neurosurgery or otolaryngology    PAST SURGICAL HISTORY:  Past Surgical History  Procedure Date  . Ventriculoperitoneal shunt 1998    at [facility]  . Intracranial mass resected 1998    at [facility]; ? location-right middle ear  . Hemorrhoidectomy   . Trigger finger release     right thumb  . Tubal ligation 1980  . Colonoscopy 06/2010    MEDICATIONS:    Previous Medications   ACCU-CHEK AVIVA PLUS TEST STRIP    USE AS DIRECTED TO CHECK BLOOD SUGAR THREE TIMESDAILY AS DIRECTED.   ALCOHOL SWABS (B-D SINGLE USE SWABS REGULAR) PADS       AMLODIPINE (NORVASC) 5 MG TABLET    Take 1 tablet (5 mg total) by mouth daily.   ASPIRIN 81 MG TABLET    Take 81 mg by mouth daily.     CINNAMON 500 MG CAPSULE    Take 500 mg by mouth 2 (two) times daily.     CLONIDINE (CATAPRES) 0.1 MG TABLET    Take 1 tablet (0.1 mg total) by mouth 2 (two) times daily.   CLOTRIMAZOLE-BETAMETHASONE (LOTRISONE) CREAM    Apply 1 application topically 2 (two) times daily. Apply twice a day fir 10 days to rash , then as needed uses under breast   CYCLOBENZAPRINE (FLEXERIL) 10 MG TABLET    Take 1 tablet (10 mg total) by mouth at bedtime as needed for muscle spasms.   DOCUSATE SODIUM (COLACE) 100 MG CAPSULE    Take 100 mg by mouth as needed.     GABAPENTIN (NEURONTIN) 100 MG CAPSULE    Take 1 capsule (100 mg total) by mouth 2 (two) times daily.   IRON 66 MG TABS    Take 1 tablet by mouth daily.     KETOCONAZOLE (NIZORAL) 2 % CREAM    Apply topically daily. Apply to affected area as needed    MELOXICAM (MOBIC) 7.5  MG TABLET    Take 1 tablet (7.5 mg total) by mouth daily.   NYSTATIN (NYSTOP) 100000 UNIT/GM POWD    Apply topically. Apply twice daily to affected area fr 2 weeks, then as needed for rash   OMEPRAZOLE (PRILOSEC) 20 MG CAPSULE    Take 20 mg by mouth daily as needed. For acid reflex   TRIAMTERENE-HYDROCHLOROTHIAZIDE (MAXZIDE-25) 37.5-25 MG PER TABLET       VALSARTAN-HYDROCHLOROTHIAZIDE (DIOVAN-HCT) 320-12.5 MG PER TABLET    Take 1 tablet by mouth daily.       ALLERGIES:  Allergies as of 11/18/2010 - Review Complete 11/18/2010  Allergen Reaction Noted  . Glimepiride  10/13/2009  . Glipizide  10/13/2009  . Rosiglitazone maleate  10/13/2009     FAMILY HISTORY:  Family History  Problem Relation Age of Onset  . Cancer Mother     lung   . Hypertension Mother 57  . Kidney  failure Brother   . Diabetes Sister   . Diabetes Brother   . Hypertension Brother      SOCIAL HISTORY: History   Social History  . Marital Status: Married    Spouse Name: N/A    Number of Children: 3  . Years of Education: N/A   Occupational History  . disabled     Social History Main Topics  . Smoking status: Former Smoker -- 0.5 packs/day for 15 years    Types: Cigarettes    Quit date: 07/07/2001  . Smokeless tobacco: Never Used  . Alcohol Use: No  . Drug Use: No  . Sexually Active: None   Other Topics Concern  . None   Social History Narrative   Mother of 3 - all children are deceased     Review of Systems 10 Systems reviewed and are negative for acute change except as noted in the HPI.  Physical Exam  BP 157/77  Pulse 87  Temp(Src) 98.3 F (36.8 C) (Oral)  Resp 18  Ht 5\' 1"  (1.549 m)  Wt 166 lb (75.297 kg)  BMI 31.37 kg/m2  SpO2 100%  Physical Exam  Nursing note and vitals reviewed. Constitutional: She is oriented to person, place, and time. She appears well-developed and well-nourished. No distress.       Hypertensive.   HENT:  Head: Normocephalic and atraumatic.  Eyes: EOM are normal. Pupils are equal, round, and reactive to light.  Neck: Neck supple.  Cardiovascular: Normal rate and regular rhythm.  Exam reveals no gallop and no friction rub.   No murmur heard. Pulmonary/Chest: Effort normal and breath sounds normal. No respiratory distress. She has no wheezes.  Abdominal: Soft. Bowel sounds are normal. She exhibits no distension. There is no tenderness. There is no rebound and no guarding.  Musculoskeletal: Normal range of motion. She exhibits no edema.  Lymphadenopathy:    She has no cervical adenopathy.  Neurological: She is alert and oriented to person, place, and time. No cranial nerve deficit or sensory deficit.  Skin: Skin is warm and dry.  Psychiatric: She has a normal mood and affect. Her behavior is normal.    ED Course   Procedures  OTHER DATA REVIEWED: Nursing notes, vital signs, and past medical records reviewed. Lab results reviewed and considered Imaging results reviewed and considered  DIAGNOSTIC STUDIES: Oxygen Saturation is 100% on room air, normal by my interpretation.    LABS / RADIOLOGY: Results for orders placed during the hospital encounter of 11/18/10  GLUCOSE, CAPILLARY      Component Value Range  Glucose-Capillary 152 (*) 70 - 99 (mg/dL)   Comment 1 Notify RN    CBC      Component Value Range   WBC 8.3  4.0 - 10.5 (K/uL)   RBC 3.93  3.87 - 5.11 (MIL/uL)   Hemoglobin 10.4 (*) 12.0 - 15.0 (g/dL)   HCT 21.3 (*) 08.6 - 46.0 (%)   MCV 83.7  78.0 - 100.0 (fL)   MCH 26.5  26.0 - 34.0 (pg)   MCHC 31.6  30.0 - 36.0 (g/dL)   RDW 57.8  46.9 - 62.9 (%)   Platelets 290  150 - 400 (K/uL)  DIFFERENTIAL      Component Value Range   Neutrophils Relative 69  43 - 77 (%)   Neutro Abs 5.7  1.7 - 7.7 (K/uL)   Lymphocytes Relative 24  12 - 46 (%)   Lymphs Abs 2.0  0.7 - 4.0 (K/uL)   Monocytes Relative 5  3 - 12 (%)   Monocytes Absolute 0.4  0.1 - 1.0 (K/uL)   Eosinophils Relative 2  0 - 5 (%)   Eosinophils Absolute 0.1  0.0 - 0.7 (K/uL)   Basophils Relative 0  0 - 1 (%)   Basophils Absolute 0.0  0.0 - 0.1 (K/uL)  BASIC METABOLIC PANEL      Component Value Range   Sodium 139  135 - 145 (mEq/L)   Potassium 4.4  3.5 - 5.1 (mEq/L)   Chloride 102  96 - 112 (mEq/L)   CO2 28  19 - 32 (mEq/L)   Glucose, Bld 137 (*) 70 - 99 (mg/dL)   BUN 23  6 - 23 (mg/dL)   Creatinine, Ser 5.28 (*) 0.50 - 1.10 (mg/dL)   Calcium 41.3  8.4 - 10.5 (mg/dL)   GFR calc non Af Amer 45 (*) >60 (mL/min)   GFR calc Af Amer 55 (*) >60 (mL/min)  GLUCOSE, CAPILLARY      Component Value Range   Glucose-Capillary 137 (*) 70 - 99 (mg/dL)   Comment 1 Repeat Test     Comment 2 Documented in Chart     Comment 3 Notify RN     Dg Abd Acute W/chest  11/18/2010  *RADIOLOGY REPORT*  Clinical Data: Diarrhea, hypoglycemia  ACUTE  ABDOMEN SERIES (ABDOMEN 2 VIEW & CHEST 1 VIEW)  Comparison: None  Findings: Upper-normal size of cardiac silhouette. Mediastinal contours and pulmonary vascularity normal. Question atherosclerotic calcification in aorta. Lungs clear. Ventriculoperitoneal shunt tubing traverses left cervical region, left chest, and extends into left abdomen, coiled in pelvis. Nonobstructive bowel gas pattern. No bowel dilatation, bowel wall thickening or free intraperitoneal air. Degenerative facet disease changes lower lumbar spine. Few tiny phleboliths and vascular calcifications are noted. No definite urinary tract calcification. Few artifacts from clothing noted.  IMPRESSION: No acute abnormalities. VP shunt.  Original Report Authenticated By: Lollie Marrow, M.D.     PROCEDURES:  ED COURSE / COORDINATION OF CARE: Orders Placed This Encounter  Procedures  . DG Abd Acute W/Chest  . Glucose, capillary  . CBC  . Differential  . Basic metabolic panel  . Glucose, capillary   Recheck 3:06PM  Pt is feeling better no additional episode of diarrhea while in ED.  Pt was informed of lab results and intent to discharge home.  Pt agrees with plan at this time.     MDM: IMPROVED IN ED IN NAD DIARRHEA STOPPED. FEELING MUCH BETTER.    PLAN: Discharge home.  The patient is to return the emergency  department if there is any worsening of symptoms. I have reviewed the discharge instructions with the patient/family  CONDITION ON DISCHARGE: Good.    MEDICATIONS GIVEN IN THE E.D.  Medications  sodium chloride 0.9 % bolus 250 mL (1000 mL Intravenous Given 11/18/10 1403)  0.9 %  sodium chloride infusion (not administered)  valsartan-hydrochlorothiazide (DIOVAN-HCT) 320-12.5 MG per tablet (not administered)  losartan-hydrochlorothiazide (HYZAAR) 100-12.5 MG per tablet (not administered)  insulin glargine (LANTUS) 100 UNIT/ML injection (not administered)  ondansetron (ZOFRAN) injection 4 mg (4 mg Intravenous Given  11/18/10 1410)      I personally performed the services described in this documentation, which was scribed in my presence. The recorded information has been reviewed and considered. Christine Jakes, MD     Christine Jakes, MD 11/18/10 [phone]

## 2010-11-18 NOTE — ED Notes (Signed)
Patient states this morning her blood sugar was low this morning 110 at 7 am. Drank some juice and some glucose tablets 117. Rose to over 200. States she just recently started taking insulin for DM.  Complains of diarrhea that started at 7 am this morning. Reports has been to the bathroom 5 times, denies blood in stool. Confirms nausea but denies vomiting. States she just feels weak and wonders if she may be dehydrated.

## 2010-11-18 NOTE — ED Notes (Signed)
Assisting patient to restroom.

## 2010-11-18 NOTE — ED Notes (Signed)
Pt says had had diarrhea since this am.  Says her doctor recently stared pt back on insulin and says her blood sugar was low this am.  Says was 110 this morning and she felt very shakey.

## 2010-11-20 LAB — COMPLETE METABOLIC PANEL WITH GFR
Albumin: 3.7 g/dL (ref 3.5–5.2)
CO2: 29 mEq/L (ref 19–32)
Chloride: 105 mEq/L (ref 96–112)
GFR, Est African American: 47 mL/min — ABNORMAL LOW (ref >60)
GFR, Est Non African American: 38 mL/min — ABNORMAL LOW (ref >60)
Glucose, Bld: 137 mg/dL — ABNORMAL HIGH (ref 70–99)
Potassium: 4.9 mEq/L (ref 3.5–5.3)
Sodium: 141 mEq/L (ref 135–145)
Total Protein: 7.3 g/dL (ref 6.0–8.3)

## 2010-11-23 ENCOUNTER — Encounter: Payer: Self-pay | Admitting: Family Medicine

## 2010-11-24 ENCOUNTER — Encounter: Payer: Self-pay | Admitting: Family Medicine

## 2010-11-24 ENCOUNTER — Ambulatory Visit (INDEPENDENT_AMBULATORY_CARE_PROVIDER_SITE_OTHER): Payer: Medicare HMO | Admitting: Family Medicine

## 2010-11-24 VITALS — BP 130/70 | HR 92 | Resp 16 | Ht 61.5 in | Wt 164.0 lb

## 2010-11-24 DIAGNOSIS — E119 Type 2 diabetes mellitus without complications: Secondary | ICD-10-CM

## 2010-11-24 DIAGNOSIS — H9190 Unspecified hearing loss, unspecified ear: Secondary | ICD-10-CM

## 2010-11-24 DIAGNOSIS — M549 Dorsalgia, unspecified: Secondary | ICD-10-CM

## 2010-11-24 DIAGNOSIS — IMO0001 Reserved for inherently not codable concepts without codable children: Secondary | ICD-10-CM

## 2010-11-24 DIAGNOSIS — I10 Essential (primary) hypertension: Secondary | ICD-10-CM

## 2010-11-24 DIAGNOSIS — H9191 Unspecified hearing loss, right ear: Secondary | ICD-10-CM | POA: Insufficient documentation

## 2010-11-24 DIAGNOSIS — R42 Dizziness and giddiness: Secondary | ICD-10-CM

## 2010-11-24 DIAGNOSIS — M62830 Muscle spasm of back: Secondary | ICD-10-CM

## 2010-11-24 DIAGNOSIS — M5380 Other specified dorsopathies, site unspecified: Secondary | ICD-10-CM

## 2010-11-24 NOTE — Patient Instructions (Addendum)
F/u in 3.5 months   You are being referred to endocrinology and cardiology about uncontrolled diabetes and recurrent light headedness.  You are being referred for a carotid doppler study.  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 I hope you feel better  pls call in mid September for flu vaccine

## 2010-11-26 ENCOUNTER — Ambulatory Visit (HOSPITAL_COMMUNITY)
Admission: RE | Admit: 2010-11-26 | Discharge: 2010-11-26 | Disposition: A | Discharge status: Home / Self Care | Payer: Medicare HMO | Source: Ambulatory Visit | Attending: Family Medicine | Admitting: Family Medicine

## 2010-11-26 DIAGNOSIS — R0989 Other specified symptoms and signs involving the circulatory and respiratory systems: Secondary | ICD-10-CM | POA: Insufficient documentation

## 2010-11-26 DIAGNOSIS — R42 Dizziness and giddiness: Secondary | ICD-10-CM | POA: Insufficient documentation

## 2010-11-26 DIAGNOSIS — I10 Essential (primary) hypertension: Secondary | ICD-10-CM | POA: Insufficient documentation

## 2010-11-27 ENCOUNTER — Encounter: Payer: Self-pay | Admitting: Adult Health

## 2010-11-27 ENCOUNTER — Ambulatory Visit (INDEPENDENT_AMBULATORY_CARE_PROVIDER_SITE_OTHER): Payer: Medicare HMO | Admitting: Adult Health

## 2010-11-27 VITALS — BP 132/70 | HR 83 | Resp 18 | Ht 63.0 in | Wt 165.8 lb

## 2010-11-27 DIAGNOSIS — I10 Essential (primary) hypertension: Secondary | ICD-10-CM

## 2010-11-27 NOTE — Assessment & Plan Note (Signed)
I will change her Norvasc to be taken at noontime and leave the clonidine and diovan/HCTZ doses as its. This may be a cummulative affect of the medications taken all at once.  She feels better later in the day. She does feel dizzy in the am hours. She will see [place] in 6 months unless symptomatic. She is to follow with PCP.

## 2010-11-27 NOTE — Patient Instructions (Signed)
Your physician recommends that you schedule a follow-up appointment in:6 months Please take norvasc daily at noon

## 2010-11-27 NOTE — Progress Notes (Signed)
HPI:  Christine Dominguez is a 42 y/p patient of Dr. Dietrich Pates we are seeing on follow-up for difficult to control hypertension.  She has had medications adjusted by Dr. Dietrich Pates on last visit in May of 2012 and we are evaluating her response. She is having no cardiac issues, but is having issues with diabetes and blood sugar control. She is being referred to Dr. Fransico Him for endocrinology and is to see her next week sometime.  She complains of dizziness after taking all of her medications in the am.  BP is 101 systolic per BP cuff which is calibrated with our office readings.   Allergies  Allergen Reactions  . Amaryl   . Aspirin     Burning in stomach, nausea  . Avandia (Rosiglitazone Maleate)   . Glimepiride   . Glipizide     Current Outpatient Prescriptions  Medication Sig Dispense Refill  . ACCU-CHEK AVIVA PLUS test strip USE AS DIRECTED TO CHECK BLOOD SUGAR THREE TIMESDAILY AS DIRECTED.  300 each  0  . Alcohol Swabs (B-D SINGLE USE SWABS REGULAR) PADS       . amLODipine (NORVASC) 5 MG tablet Take 5 mg by mouth daily at 12 noon. At noon        . aspirin 81 MG tablet Take 81 mg by mouth daily.        . cloNIDine (CATAPRES) 0.1 MG tablet Take 1 tablet (0.1 mg total) by mouth 2 (two) times daily.  180 tablet  2  . clotrimazole-betamethasone (LOTRISONE) cream Apply 1 application topically 2 (two) times daily. Apply twice a day fir 10 days to rash , then as needed uses under breast      . cyclobenzaprine (FLEXERIL) 10 MG tablet Take 1 tablet (10 mg total) by mouth at bedtime as needed for muscle spasms.  90 tablet  1  . docusate sodium (COLACE) 100 MG capsule Take 100 mg by mouth as needed.        . gabapentin (NEURONTIN) 100 MG capsule Take 1 capsule (100 mg total) by mouth 2 (two) times daily.  180 capsule  2  . insulin glargine (LANTUS) 100 UNIT/ML injection Inject 20 Units into the skin at bedtime.        . Iron 66 MG TABS Take 1 tablet by mouth 2 (two) times daily.       Marland Kitchen nystatin  (NYSTOP) 100000 UNIT/GM POWD Apply topically. Apply twice daily to affected area fr 2 weeks, then as needed for rash      . omeprazole (PRILOSEC) 20 MG capsule Take 20 mg by mouth daily as needed. For acid reflex      . valsartan-hydrochlorothiazide (DIOVAN-HCT) 320-12.5 MG per tablet Take 1 tablet by mouth daily.        Marland Kitchen DISCONTD: amLODipine (NORVASC) 5 MG tablet Take 1 tablet (5 mg total) by mouth daily.  90 tablet  2  . Cinnamon 500 MG capsule Take 500 mg by mouth 2 (two) times daily.        Marland Kitchen ketoconazole (NIZORAL) 2 % cream Apply topically daily. Apply to affected area as needed       . loperamide (IMODIUM) 2 MG capsule Take 1 capsule (2 mg total) by mouth 4 (four) times daily as needed for diarrhea/loose stools.  12 capsule  0  . losartan-hydrochlorothiazide (HYZAAR) 100-12.5 MG per tablet Take 1 tablet by mouth daily. Patient is waiting for medication to come through mail order and she will continue to take Diovan until she receive  medication       . DISCONTD: losartan-hydrochlorothiazide (HYZAAR) 100-12.5 MG per tablet Take 1 tablet by mouth daily.  90 tablet  1    Past Medical History  Diagnosis Date  . Chronic back pain 2007    disabled   . Diabetes mellitus 1991  . Hypertension 1980  . Obesity   . Hydrocephalus     treated with VP shunt and then intracranial tumor resection (?Cholesteatoma); no longer followed actively by neurosurgery or otolaryngology    Past Surgical History  Procedure Date  . Ventriculoperitoneal shunt 1998    at [facility]  . Intracranial mass resected 1998    at [facility]; ? location-right middle ear  . Hemorrhoidectomy   . Trigger finger release     right thumb  . Tubal ligation 1980  . Colonoscopy 06/2010    WUJ:WJXBJY of systems complete and found to be negative unless listed above PHYSICAL EXAM BP 132/70  Pulse 83  Resp 18  Ht 5\' 3"  (1.6 m)  Wt 165 lb 12.8 oz (75.206 kg)  BMI 29.37 kg/m2  SpO2 99% General: Well developed, well nourished, in no  acute distress Head: Eyes PERRLA, No xanthomas.   Normal cephalic and atramatic  Lungs: Clear bilaterally to auscultation and percussion. Heart: HRRR S1 S2,  Pulses are 2+ & equal.            No carotid bruit. No JVD.  No abdominal bruits. No femoral bruits. Abdomen: Bowel sounds are positive, abdomen soft and non-tender without masses or                  Hernia's noted. Msk:  Back normal, normal gait. Normal strength and tone for age. Extremities: No clubbing, cyanosis or edema.  DP +1 Neuro: Alert and oriented X 3. Psych:  Good affect, responds appropriately    ASSESSMENT AND PLAN

## 2010-12-01 NOTE — Assessment & Plan Note (Signed)
Uncontrolled , and pt uncomfortable with current regime , reports marked fluctuation in blood sugars, still has not taken advantage of individual nutritional counseling, will refer to endo per her preference

## 2010-12-01 NOTE — Progress Notes (Signed)
  Subjective:    Patient ID: Christine Dominguez, female    DOB: 10/25/1950, 60 y.o.   MRN: [id number] HPI Pt i with concerns of uncontrolled blood sugars , states her sugar fluctuates a lot and often falls, espescialy in the mornings. She recently needed to change to insulin, and though her numbers are improving , she is uncomfortable with the fluctuation. A referral to endocrine is being done and she states this is what she wanted. She continues to c/o lightheadedness and unsteady gait, and though she has been to cardiology earlier this year about her blood pressure, re-evaluation is indicated. She denies chest pain or palpitations. Has a past h/o tumor of the ear, for which she had surgery at [facility] approximately 15 years ago   Review of Systems See HPI Denies recent fever or chills.States she does not feel well. Often weak and tired with light headedness, and fluctuations in blood sugars Denies sinus pressure, nasal congestion, ear pain or sore throat. Denies chest congestion, productive cough or wheezing. Denies chest pains, palpitations and leg swelling Denies abdominal pain, nausea, vomiting,diarrhea or constipation.   Denies dysuria, frequency, hesitancy or incontinence. States back pain has markedly lessened Denies headaches, seizures, numbness, or tingling. Denies depression, anxiety or insomnia. Denies skin break down or rash.        Objective:   Physical Exam Patient alert and oriented and in no cardiopulmonary distress.  HEENT: No facial asymmetry, EOMI, no sinus tenderness,  oropharynx pink and moist.  Neck supple no adenopathy.  Chest: Clear to auscultation bilaterally.  CVS: S1, S2 no murmurs, no S3.  ABD: Soft non tender. Bowel sounds normal.  Ext: No edema  MS: Adequate ROM spine, shoulders, hips and knees.  Skin: Intact, no ulcerations or rash noted.  Psych: Good eye contact, normal affect. Memory intact not anxious or depressed appearing.  CNS: CN  2-12 intact, power, tone and sensation normal throughout.        Assessment & Plan:

## 2010-12-01 NOTE — Assessment & Plan Note (Signed)
Evaluation by local eNT, reports lightheadedness, and has had surgery on the ear

## 2010-12-01 NOTE — Assessment & Plan Note (Signed)
improved

## 2010-12-01 NOTE — Assessment & Plan Note (Signed)
Pt very symptomatic, will have cardiology re-evaluate

## 2010-12-11 ENCOUNTER — Other Ambulatory Visit (INDEPENDENT_AMBULATORY_CARE_PROVIDER_SITE_OTHER): Payer: Self-pay | Admitting: Otolaryngology

## 2010-12-11 DIAGNOSIS — D492 Neoplasm of unspecified behavior of bone, soft tissue, and skin: Secondary | ICD-10-CM

## 2010-12-14 ENCOUNTER — Telehealth: Payer: Self-pay | Admitting: Adult Health

## 2010-12-14 NOTE — Telephone Encounter (Signed)
Spoke with pt, she took her losartan/hctz on sat and noticed she was dizzy and having trouble keeping her balance. Her bp was 97/64. She has not taken any more since Saturday and the dizziness is gone. Her bp today without the losartan/hctz is 107/62. Will forward for [employer] lawrence np review Deliah Goody

## 2010-12-14 NOTE — Telephone Encounter (Signed)
Patient has questions about her dosage of Losartin/HCTZ. Patient states that she is dizzy and BP is low after taking this medicine. / tg

## 2010-12-15 ENCOUNTER — Emergency Department (HOSPITAL_COMMUNITY)
Admission: EM | Admit: 2010-12-15 | Discharge: 2010-12-16 | Disposition: A | Discharge status: Home / Self Care | Payer: Medicare HMO | Attending: Emergency Medicine | Admitting: Emergency Medicine

## 2010-12-15 ENCOUNTER — Emergency Department (HOSPITAL_COMMUNITY): Payer: Medicare HMO

## 2010-12-15 ENCOUNTER — Encounter (HOSPITAL_COMMUNITY): Payer: Self-pay | Admitting: Emergency Medicine

## 2010-12-15 ENCOUNTER — Other Ambulatory Visit (INDEPENDENT_AMBULATORY_CARE_PROVIDER_SITE_OTHER): Payer: Self-pay | Admitting: Otolaryngology

## 2010-12-15 DIAGNOSIS — M549 Dorsalgia, unspecified: Secondary | ICD-10-CM | POA: Insufficient documentation

## 2010-12-15 DIAGNOSIS — Z7982 Long term (current) use of aspirin: Secondary | ICD-10-CM | POA: Insufficient documentation

## 2010-12-15 DIAGNOSIS — Z79899 Other long term (current) drug therapy: Secondary | ICD-10-CM | POA: Insufficient documentation

## 2010-12-15 DIAGNOSIS — E119 Type 2 diabetes mellitus without complications: Secondary | ICD-10-CM | POA: Insufficient documentation

## 2010-12-15 DIAGNOSIS — Z794 Long term (current) use of insulin: Secondary | ICD-10-CM | POA: Insufficient documentation

## 2010-12-15 DIAGNOSIS — F17200 Nicotine dependence, unspecified, uncomplicated: Secondary | ICD-10-CM | POA: Insufficient documentation

## 2010-12-15 DIAGNOSIS — I10 Essential (primary) hypertension: Secondary | ICD-10-CM | POA: Insufficient documentation

## 2010-12-15 DIAGNOSIS — J069 Acute upper respiratory infection, unspecified: Secondary | ICD-10-CM | POA: Insufficient documentation

## 2010-12-15 DIAGNOSIS — D492 Neoplasm of unspecified behavior of bone, soft tissue, and skin: Secondary | ICD-10-CM

## 2010-12-15 LAB — GLUCOSE, CAPILLARY: Glucose-Capillary: 134 mg/dL — ABNORMAL HIGH (ref 70–99)

## 2010-12-15 NOTE — ED Notes (Signed)
Pt reports nonproductive cough since yesterday. Started running fever today. Took something otc that the pharmisist recommended.

## 2010-12-15 NOTE — ED Notes (Signed)
Pt assisted to restroom & returned to room.

## 2010-12-15 NOTE — ED Notes (Signed)
Patient c/o flu-like symptoms since yesterday; c/o cough and congestion.

## 2010-12-15 NOTE — ED Provider Notes (Addendum)
Scribed for Ward Givens, MD, the patient was seen in room APA03/APA03 . This chart was scribed by Ellie Lunch. This patient's care was started at 9:25 PM.   CSN: [id number] Arrival date & time: 12/15/2010  9:04 PM  Chief Complaint  Patient presents with  . Cough  . Fever   HPI Christine Dominguez is a 60 y.o. female who presents to the Emergency Department complaining of cold sx starting yesterday morning. Pt reports she developed scratchy throat starting yesterday morning and progressed to a nonproductive cough with assocaited congestions and chills. Pt denies having a fever at home, but arrived with a fever of 101.9. Pt denies nausea vomiting or diarrhea. Pt reports heart is beating fast when she was checking her blood pressure this afternoon. She states she was taken off her Diovan and was started on losartan/hydrochlorothiazide and her blood pressure has been 90 for the last couple of days states that stopped taking her blood pressure medicine the past 2 days.(I advised her that her blood pressure improved to take a half a tablet a day)  Feels lightheaded but is not dizzy. No appetite. . Took cold medicine OTC recommended by the pharmacist about 6 hours ago. Patient also states she is scheduled to get a flu shot tomorrow however she has called and canceled it  No recent sick contacts.   Past Medical History  Diagnosis Date  . Chronic back pain 2007    disabled   . Diabetes mellitus 1991  . Hypertension 1980  . Obesity   . Hydrocephalus     treated with VP shunt and then intracranial tumor resection (?Cholesteatoma); no longer followed actively by neurosurgery or otolaryngology    Past Surgical History  Procedure Date  . Ventriculoperitoneal shunt 1998    at [facility]  . Intracranial mass resected 1998    at [facility]; ? location-right middle ear  . Hemorrhoidectomy   . Trigger finger release     right thumb  . Tubal ligation 1980  . Colonoscopy 06/2010   MEDICATIONS:  Previous  Medications   ACCU-CHEK AVIVA PLUS TEST STRIP    USE AS DIRECTED TO CHECK BLOOD SUGAR THREE TIMESDAILY AS DIRECTED.   ALCOHOL SWABS (B-D SINGLE USE SWABS REGULAR) PADS       AMLODIPINE (NORVASC) 5 MG TABLET    Take 5 mg by mouth daily at 12 noon. At noon     ASPIRIN 81 MG TABLET    Take 81 mg by mouth daily.     CINNAMON 500 MG CAPSULE    Take 500 mg by mouth 2 (two) times daily.     CLONIDINE (CATAPRES) 0.1 MG TABLET    Take 1 tablet (0.1 mg total) by mouth 2 (two) times daily.   CLOTRIMAZOLE-BETAMETHASONE (LOTRISONE) CREAM    Apply 1 application topically 2 (two) times daily. Apply twice a day fir 10 days to rash , then as needed uses under breast   CYCLOBENZAPRINE (FLEXERIL) 10 MG TABLET    Take 1 tablet (10 mg total) by mouth at bedtime as needed for muscle spasms.   DOCUSATE SODIUM (COLACE) 100 MG CAPSULE    Take 100 mg by mouth as needed.     GABAPENTIN (NEURONTIN) 100 MG CAPSULE    Take 1 capsule (100 mg total) by mouth 2 (two) times daily.   INSULIN GLARGINE (LANTUS) 100 UNIT/ML INJECTION    Inject 20 Units into the skin at bedtime.     IRON 66 MG TABS  Take 1 tablet by mouth 2 (two) times daily.    KETOCONAZOLE (NIZORAL) 2 % CREAM    Apply topically daily. Apply to affected area as needed    LOSARTAN-HYDROCHLOROTHIAZIDE (HYZAAR) 100-12.5 MG PER TABLET    Take 1 tablet by mouth daily. Patient is waiting for medication to come through mail order and she will continue to take Diovan until she receive medication    NYSTATIN (NYSTOP) 100000 UNIT/GM POWD    Apply topically. Apply twice daily to affected area fr 2 weeks, then as needed for rash   OMEPRAZOLE (PRILOSEC) 20 MG CAPSULE    Take 20 mg by mouth daily as needed. For acid reflex   VALSARTAN-HYDROCHLOROTHIAZIDE (DIOVAN-HCT) 320-12.5 MG PER TABLET    Take 1 tablet by mouth daily.       ALLERGIES:  Allergies as of 12/15/2010 - Review Complete 12/15/2010  Allergen Reaction Noted  . Amaryl  11/24/2010  . Aspirin  11/24/2010  .  Avandia (rosiglitazone maleate)  10/13/2009  . Glimepiride  10/13/2009  . Glipizide  10/13/2009     Family History  Problem Relation Age of Onset  . Cancer Mother     lung   . Hypertension Mother 17  . Kidney failure Brother   . Diabetes Sister   . Diabetes Brother   . Hypertension Brother     History  Substance Use Topics  . Smoking status: Former Smoker -- 0.5 packs/day for 15 years    Types: Cigarettes    Quit date: 07/07/2001  . Smokeless tobacco: Never Used  . Alcohol Use: No  lives with husband   Review of Systems  All other systems reviewed and are negative.    Physical Exam  BP 175/78  Pulse 120  Temp(Src) 100.1 F (37.8 C) (Oral)  Resp 20  Ht 5' 1.5" (1.562 m)  Wt 164 lb (74.39 kg)  BMI 30.49 kg/m2  SpO2 99%  Physical Exam  Vitals reviewed. Constitutional: She is oriented to person, place, and time. She appears well-developed and well-nourished.  HENT:  Head: Normocephalic and atraumatic.  Eyes: Conjunctivae and EOM are normal. Pupils are equal, round, and reactive to light.  Neck: Normal range of motion. Neck supple.  Cardiovascular: Normal rate, regular rhythm and normal heart sounds.   Pulmonary/Chest: Effort normal and breath sounds normal. No respiratory distress. She has no wheezes. She has no rales.       Patient does have frequent coughing  Abdominal: Soft. Bowel sounds are normal.  Musculoskeletal: Normal range of motion.  Neurological: She is alert and oriented to person, place, and time.  Skin: Skin is warm and dry.  Psychiatric: She has a normal mood and affect.   Procedures  OTHER DATA REVIEWED: Nursing notes, vital signs, and past medical records reviewed.   DIAGNOSTIC STUDIES: Oxygen Saturation is 99% on room air, normal by my interpretation.    LABS / RADIOLOGY:  Results for orders placed during the hospital encounter of 12/15/10  GLUCOSE, CAPILLARY      Component Value Range   Glucose-Capillary 134 (*) 70 - 99 (mg/dL)    Dg Chest 2 View  1/61/0960  *RADIOLOGY REPORT*  Clinical Data: Cough and fever.  CHEST - 2 VIEW  Comparison: Chest and abdominal radiographs performed 11/18/2010  Findings: The lungs are relatively well-aerated and clear.  There is no evidence of focal opacification, pleural effusion or pneumothorax.  The heart is normal in size; the mediastinal contour is within normal limits.  No acute osseous abnormalities are  seen.  IMPRESSION: No acute cardiopulmonary process seen.  Original Report Authenticated By: Tonia Ghent, M.D.   DISCHARGE MEDICATIONS: New Prescriptions   No medications on file   I personally performed the services described in this documentation, which was scribed in my presence. The recorded information has been reviewed and considered. Devoria Albe, MD, FACEP      Ward Givens, MD 12/15/10 7829  Ward Givens, MD 12/29/10 [phone]

## 2010-12-16 ENCOUNTER — Ambulatory Visit: Payer: Medicare HMO

## 2010-12-16 ENCOUNTER — Encounter (HOSPITAL_COMMUNITY): Payer: Self-pay | Admitting: *Deleted

## 2010-12-16 NOTE — Telephone Encounter (Signed)
PT IS CALLING AGAIN WANTING TO KNOW WHAT TO DO ABOUT THIS MEDICATIONS SHE IS DUE TO ORDER MORE AND WANTS TO KNOW ABOUT GOING BACK TO DIOVAN SINCE THAT WAS NOT CAUSING ANY PROBLEMS.  PT BP TODAY WAS 110/72.

## 2010-12-17 ENCOUNTER — Ambulatory Visit (HOSPITAL_COMMUNITY): Payer: Medicare HMO

## 2010-12-17 ENCOUNTER — Other Ambulatory Visit: Payer: Self-pay

## 2010-12-17 MED ORDER — VALSARTAN-HYDROCHLOROTHIAZIDE 320-12.5 MG PO TABS: 1.0000 | ORAL_TABLET | Freq: Every day | ORAL | Status: DC

## 2010-12-17 NOTE — Telephone Encounter (Signed)
Patient my use diovan, but it is known as VALSARTAN/HCTZ.  She is listed as taking LOSARTAN/HCTZ per records, but was waiting to get it in the mail. She was given samples of Diovan to tide her over.  If she chooses to use Diovan, she should stop losartan. She can have new Rx for this.  Alveria Apley NP

## 2010-12-24 ENCOUNTER — Ambulatory Visit (HOSPITAL_COMMUNITY): Admission: RE | Admit: 2010-12-24 | Payer: Medicare HMO | Source: Ambulatory Visit

## 2010-12-29 ENCOUNTER — Ambulatory Visit (HOSPITAL_COMMUNITY)
Admission: RE | Admit: 2010-12-29 | Discharge: 2010-12-29 | Disposition: A | Discharge status: Home / Self Care | Payer: Medicare HMO | Source: Ambulatory Visit | Attending: Otolaryngology | Admitting: Otolaryngology

## 2010-12-29 DIAGNOSIS — H9190 Unspecified hearing loss, unspecified ear: Secondary | ICD-10-CM | POA: Insufficient documentation

## 2010-12-29 DIAGNOSIS — D491 Neoplasm of unspecified behavior of respiratory system: Secondary | ICD-10-CM | POA: Insufficient documentation

## 2010-12-29 DIAGNOSIS — D492 Neoplasm of unspecified behavior of bone, soft tissue, and skin: Secondary | ICD-10-CM | POA: Insufficient documentation

## 2010-12-29 MED ORDER — IOHEXOL 300 MG/ML  SOLN
60.0000 mL | Freq: Once | INTRAMUSCULAR | Status: AC | PRN
  Administered 2010-12-29: 60 mL via INTRAVENOUS

## 2010-12-31 ENCOUNTER — Ambulatory Visit (INDEPENDENT_AMBULATORY_CARE_PROVIDER_SITE_OTHER): Payer: Medicare HMO | Admitting: Otolaryngology

## 2010-12-31 DIAGNOSIS — D356 Benign neoplasm of aortic body and other paraganglia: Secondary | ICD-10-CM

## 2010-12-31 DIAGNOSIS — G519 Disorder of facial nerve, unspecified: Secondary | ICD-10-CM

## 2011-02-15 ENCOUNTER — Telehealth: Payer: Self-pay | Admitting: Family Medicine

## 2011-02-23 ENCOUNTER — Other Ambulatory Visit: Payer: Self-pay | Admitting: Family Medicine

## 2011-03-05 ENCOUNTER — Telehealth: Payer: Self-pay | Admitting: Family Medicine

## 2011-03-05 DIAGNOSIS — I10 Essential (primary) hypertension: Secondary | ICD-10-CM

## 2011-03-05 MED ORDER — BD SWAB SINGLE USE REGULAR PADS: MEDICATED_PAD | Status: DC

## 2011-03-05 MED ORDER — CLONIDINE HCL 0.1 MG PO TABS: 0.1000 mg | ORAL_TABLET | Freq: Two times a day (BID) | ORAL | Status: DC

## 2011-03-05 MED ORDER — AMLODIPINE BESYLATE 5 MG PO TABS: 5.0000 mg | ORAL_TABLET | Freq: Every day | ORAL | Status: DC

## 2011-03-05 NOTE — Telephone Encounter (Signed)
Sent in to [facility]

## 2011-03-25 ENCOUNTER — Ambulatory Visit: Payer: Medicare HMO | Admitting: Family Medicine

## 2011-04-06 DIAGNOSIS — E039 Hypothyroidism, unspecified: Secondary | ICD-10-CM

## 2011-04-06 DIAGNOSIS — E785 Hyperlipidemia, unspecified: Secondary | ICD-10-CM

## 2011-04-06 HISTORY — DX: Hyperlipidemia, unspecified: E78.5

## 2011-04-06 HISTORY — DX: Hypothyroidism, unspecified: E03.9

## 2011-05-20 ENCOUNTER — Emergency Department (HOSPITAL_COMMUNITY)
Admission: EM | Admit: 2011-05-20 | Discharge: 2011-05-20 | Disposition: A | Discharge status: Home / Self Care | Payer: Medicare HMO | Attending: Emergency Medicine | Admitting: Emergency Medicine

## 2011-05-20 ENCOUNTER — Encounter (HOSPITAL_COMMUNITY): Payer: Self-pay | Admitting: *Deleted

## 2011-05-20 DIAGNOSIS — Z794 Long term (current) use of insulin: Secondary | ICD-10-CM | POA: Insufficient documentation

## 2011-05-20 DIAGNOSIS — R221 Localized swelling, mass and lump, neck: Secondary | ICD-10-CM | POA: Insufficient documentation

## 2011-05-20 DIAGNOSIS — R040 Epistaxis: Secondary | ICD-10-CM | POA: Insufficient documentation

## 2011-05-20 DIAGNOSIS — R220 Localized swelling, mass and lump, head: Secondary | ICD-10-CM | POA: Insufficient documentation

## 2011-05-20 DIAGNOSIS — I10 Essential (primary) hypertension: Secondary | ICD-10-CM | POA: Insufficient documentation

## 2011-05-20 DIAGNOSIS — Z79899 Other long term (current) drug therapy: Secondary | ICD-10-CM | POA: Insufficient documentation

## 2011-05-20 DIAGNOSIS — R000 Tachycardia, unspecified: Secondary | ICD-10-CM | POA: Insufficient documentation

## 2011-05-20 DIAGNOSIS — H9209 Otalgia, unspecified ear: Secondary | ICD-10-CM | POA: Insufficient documentation

## 2011-05-20 DIAGNOSIS — E119 Type 2 diabetes mellitus without complications: Secondary | ICD-10-CM | POA: Insufficient documentation

## 2011-05-20 DIAGNOSIS — Z7982 Long term (current) use of aspirin: Secondary | ICD-10-CM | POA: Insufficient documentation

## 2011-05-20 NOTE — ED Notes (Signed)
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  Told to expect some bleeding.  Very light bleeding at triage.

## 2011-05-20 NOTE — ED Notes (Signed)
Epistaxis , rt nostril x 15 min Pt pinching nostrils on arrival.

## 2011-05-20 NOTE — ED Notes (Signed)
No blood from nose at this present time.  Will continue to monitor.

## 2011-05-20 NOTE — Discharge Instructions (Signed)
Nosebleed A nosebleed can be caused by many things, including:  Getting hit hard in the nose.   Infections.   Dry nose.   Colds.   Medicines.  Your doctor may do lab testing if you get nosebleeds a lot and the cause is not known. HOME CARE   If your nose was packed with material, keep it there until your doctor takes it out. Put the pack back in your nose if the pack falls out.   Do not blow your nose for 12 hours after the nosebleed.   Sit up and bend forward if your nose starts bleeding again. Pinch the front half of your nose nonstop for 20 minutes.   Put petroleum jelly inside your nose every morning if you have a dry nose.   Use a humidifier to make the air less dry.   Do not take aspirin.   Try not to strain, lift, or bend at the waist for many days after the nosebleed.  GET HELP RIGHT AWAY IF:   Nosebleeds keep happening and are hard to stop or control.   You have bleeding or bruises that are not normal on other parts of the body.   You have a fever.   The nosebleeds get worse.   You get lightheaded, feel faint, sweaty, or throw up (vomit) blood.  MAKE SURE YOU:   Understand these instructions.   Will watch your condition.   Will get help right away if you are not doing well or get worse.  Document Released: 12/30/2007 Document Revised: 12/02/2010 Document Reviewed: 12/30/2007 [employer] Patient Information 2012 [employer]. 

## 2011-05-20 NOTE — ED Notes (Signed)
Pt DC to home via wheelchair to car

## 2011-05-20 NOTE — ED Provider Notes (Signed)
History     CSN: [id number]  Arrival date & time 05/20/11  2128   First MD Initiated Contact with Patient 05/20/11 2211      Chief Complaint  Patient presents with  . 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 The patient is seen by tumor specialist in [facility] where she is receiving radiation to try and shrink the tumor. As a result of this radiation she from time to time has nosebleed. Patient states that usually the nosebleed is very small and stops almost on its own. Tonight she had bleeding from the right nares that lasted approximately 15 minutes. The patient came to the emergency department for evaluation of this nosebleed. She denies being on any blood thinning medication she is on  Patient is a 61 y.o. female presenting with nosebleeds. The history is provided by the patient.  Epistaxis     Past Medical History  Diagnosis Date  . Chronic back pain 2007    disabled   . Diabetes mellitus 1991  . Hypertension 1980  . Obesity   . Hydrocephalus     treated with VP shunt and then intracranial tumor resection (?Cholesteatoma); no longer followed actively by neurosurgery or otolaryngology    Past Surgical History  Procedure Date  . Ventriculoperitoneal shunt 1998    at [facility]  . Intracranial mass resected 1998    at [facility]; ? location-right middle ear  . Hemorrhoidectomy   . Trigger finger release     right thumb  . Tubal ligation 1980  . Colonoscopy 06/2010    Family History  Problem Relation Age of Onset  . Cancer Mother     lung   . Hypertension Mother 34  . Kidney failure Brother   . Diabetes Sister   . Diabetes Brother   . Hypertension Brother     History  Substance Use Topics  . Smoking status: Former Smoker -- 0.5 packs/day for 15 years    Types: Cigarettes    Quit date: 07/07/2001  . Smokeless tobacco:  Never Used  . Alcohol Use: No    OB History    Grav Para Term Preterm Abortions TAB SAB Ect Mult Living                  Review of Systems  Constitutional: Negative for activity change.       All ROS Neg except as noted in HPI  HENT: Positive for ear pain and nosebleeds. Negative for neck pain.   Eyes: Negative for photophobia and discharge.  Respiratory: Negative for cough, shortness of breath and wheezing.   Cardiovascular: Negative for chest pain and palpitations.  Gastrointestinal: Negative for abdominal pain and blood in stool.  Genitourinary: Negative for dysuria, frequency and hematuria.  Musculoskeletal: Negative for back pain and arthralgias.  Skin: Negative.   Neurological: Negative for dizziness, seizures and speech difficulty.  Psychiatric/Behavioral: Negative for hallucinations and confusion.    Allergies  Amaryl; Aspirin; Avandia; Glimepiride; and Glipizide  Home Medications   Current Outpatient Rx  Name Route Sig Dispense Refill  . ACCU-CHEK AVIVA PLUS VI STRP  USE AS DIRECTED TO CHECK BLOOD SUGAR THREE TIMES DAILY AS DIRECTED. 300 each 3  . BD SWAB SINGLE USE REGULAR PADS  Use as needed daily with insulin injections 300 each 1  . AMLODIPINE BESYLATE 5 MG PO TABS Oral Take 1 tablet (5 mg total) by mouth daily at  12 noon. At noon  90 tablet 1  . ASPIRIN 81 MG PO TABS Oral Take 81 mg by mouth at bedtime.     Marland Kitchen CINNAMON 500 MG PO CAPS Oral Take 500 mg by mouth 2 (two) times daily.      Marland Kitchen CLONIDINE HCL 0.1 MG PO TABS Oral Take 1 tablet (0.1 mg total) by mouth 2 (two) times daily. 180 tablet 1  . CLOTRIMAZOLE-BETAMETHASONE 1-0.05 % EX CREA Topical Apply 1 application topically 2 (two) times daily. Apply twice a day fir 10 days to rash , then as needed uses under breast    . CYCLOBENZAPRINE HCL 10 MG PO TABS Oral Take 1 tablet (10 mg total) by mouth at bedtime as needed for muscle spasms. 90 tablet 1  . DOCUSATE SODIUM 100 MG PO CAPS Oral Take 100 mg by mouth as  needed.      Marland Kitchen GABAPENTIN 100 MG PO CAPS Oral Take 1 capsule (100 mg total) by mouth 2 (two) times daily. 180 capsule 2  . GLUCOSE 4 G PO CHEW Oral Chew 4-8 g by mouth as needed.      . INSULIN ASPART 100 UNIT/ML [place] SOLN Subcutaneous Inject 5 Units into the skin 3 (three) times daily before meals.      . INSULIN GLARGINE 100 UNIT/ML [place] SOLN Subcutaneous Inject 20 Units into the skin at bedtime.      . INSULIN GLARGINE 100 UNIT/ML [place] SOLN Subcutaneous Inject 20 Units into the skin at bedtime.      . IRON 66 MG PO TABS Oral Take 1 tablet by mouth 2 (two) times daily.     Marland Kitchen KETOCONAZOLE 2 % EX CREA Topical Apply topically daily. Apply to affected area as needed     . NYSTOP 100000 UNIT/GM EX POWD Topical Apply topically. Apply twice daily to affected area fr 2 weeks, then as needed for rash    . OMEPRAZOLE 20 MG PO CPDR Oral Take 20 mg by mouth daily as needed. For acid reflex    . SIMVASTATIN 20 MG PO TABS Oral Take 20 mg by mouth at bedtime.      Marland Kitchen VALSARTAN-HYDROCHLOROTHIAZIDE 320-12.5 MG PO TABS Oral Take 1 tablet by mouth daily. To replace Losartan/HCTZ 90 tablet 1    BP 150/77  Pulse 115  Temp(Src) 98.3 F (36.8 C) (Oral)  Resp 20  Ht 5' 5.5" (1.664 m)  Wt 139 lb 9 oz (63.305 kg)  BMI 22.87 kg/m2  SpO2 100%  Physical Exam  Nursing note and vitals reviewed. Constitutional: She is oriented to person, place, and time. She appears well-developed and well-nourished.  Non-toxic appearance.  HENT:  Head: Normocephalic.  Right Ear: Tympanic membrane and external ear normal.  Left Ear: Tympanic membrane and external ear normal.       There is mild to moderate swelling near the tragus of the right ear. The ear is not hot.  There is no bleeding or dried blood in the left nares. There is a a clot in the right nares. No active bleeding appreciated at this time. No tenderness to the sinuses to palpation in the area is not hot to touch.  Eyes: EOM and lids are normal. Pupils are equal, round,  and reactive to light.  Neck: Normal range of motion. Neck supple. Carotid bruit is not present.  Cardiovascular: Regular rhythm, normal heart sounds, intact distal pulses and normal pulses.  Tachycardia present.   Pulmonary/Chest: Breath sounds normal. No respiratory distress.  Abdominal: Soft.  Bowel sounds are normal. There is no tenderness. There is no guarding.  Musculoskeletal: Normal range of motion.  Lymphadenopathy:       Head (right side): No submandibular adenopathy present.       Head (left side): No submandibular adenopathy present.    She has no cervical adenopathy.  Neurological: She is alert and oriented to person, place, and time. She has normal strength. No cranial nerve deficit or sensory deficit.  Skin: Skin is warm and dry.  Psychiatric: She has a normal mood and affect. Her speech is normal.    ED Course  Procedures (including critical care time)  Labs Reviewed - No data to display No results found.   Dx: Epistaxis right nares   MDM  I have reviewed nursing notes, vital signs, and all appropriate lab and imaging results for this patient. Patient is being treated with radiation for a benign tumor involving the right ear area. She has been advised that she may have nosebleeds from time to time as a result of this. There is no bleeding appreciated at this time. Patient advised on how to properly pinch the nose if bleeding should recur. Patient advised to return to the emergency department if bleeding will not stop after pressure applied.       Kathie Dike, [place] 05/20/11 2227

## 2011-05-22 NOTE — ED Provider Notes (Signed)
Medical screening examination/treatment/procedure(s) were performed by non-physician practitioner and as supervising physician I was immediately available for consultation/collaboration.  Nicoletta Dress. Colon Branch, MD 05/22/11 1610

## 2011-05-28 NOTE — Telephone Encounter (Signed)
Pt aware.

## 2011-06-23 ENCOUNTER — Telehealth: Payer: Self-pay | Admitting: Family Medicine

## 2011-06-24 MED ORDER — OMEPRAZOLE 20 MG PO CPDR: 20.0000 mg | DELAYED_RELEASE_CAPSULE | Freq: Every day | ORAL | Status: DC | PRN

## 2011-06-24 NOTE — Telephone Encounter (Signed)
Sent in

## 2011-06-28 ENCOUNTER — Ambulatory Visit: Payer: Medicare HMO | Admitting: Family Medicine

## 2011-06-30 ENCOUNTER — Ambulatory Visit (INDEPENDENT_AMBULATORY_CARE_PROVIDER_SITE_OTHER): Payer: Medicare HMO | Admitting: Family Medicine

## 2011-06-30 ENCOUNTER — Other Ambulatory Visit: Payer: Self-pay | Admitting: Family Medicine

## 2011-06-30 ENCOUNTER — Encounter: Payer: Self-pay | Admitting: Family Medicine

## 2011-06-30 VITALS — BP 138/68 | HR 68 | Resp 18 | Ht 61.5 in | Wt 133.0 lb

## 2011-06-30 DIAGNOSIS — I10 Essential (primary) hypertension: Secondary | ICD-10-CM

## 2011-06-30 DIAGNOSIS — C755 Malignant neoplasm of aortic body and other paraganglia: Secondary | ICD-10-CM

## 2011-06-30 DIAGNOSIS — Z139 Encounter for screening, unspecified: Secondary | ICD-10-CM

## 2011-06-30 DIAGNOSIS — E119 Type 2 diabetes mellitus without complications: Secondary | ICD-10-CM

## 2011-06-30 DIAGNOSIS — H9209 Otalgia, unspecified ear: Secondary | ICD-10-CM

## 2011-06-30 DIAGNOSIS — H9201 Otalgia, right ear: Secondary | ICD-10-CM

## 2011-06-30 NOTE — Patient Instructions (Addendum)
F/u in 4 month. Please call if you need me before  I am thankful that you responded well to radiation treatment for the right ear tumor. I believe that you will continue to improve.  Examination of the right ear, does not show any sign of infection, however, since you report intermittent pain and drainage, continue to use the antibiotic ear drop prescribed by ENT for an additional 5 days. Ok to take tylenol one to two per day, as needed for pain.If symptoms continue, you will need to be seen sooner at [facility]  Blood pressure today is good, heart rate though fast is still under 100. I recommend you continue taking blood pressure medication as you currently are

## 2011-06-30 NOTE — Progress Notes (Signed)
  Subjective:    Patient ID: Christine Dominguez, female    DOB: 1950-12-03, 61 y.o.   MRN: [id number]  HPI The PT is here for follow up and re-evaluation of chronic medical conditions, medication management and review of any available recent lab and radiology data.  Preventive health is updated, specifically  Cancer screening and Immunization.   Questions or concerns regarding consultations or procedures which the PT has had in the interim are  Addressed.Pt has had extensive treatment at [facility] for a recurrent paraganglioma of right ear, she has completed long course of radiation, and by all reports , has responded well. She is still somewhat weak, however reports gradual increase in strength.Appetite is fair. C/o persistent discomfort in ear, not as severe and possible scant drainage, concerned about residual . Blood sugars fluctuate as does her blood pressure, still reporting lower presures than I obtain here today    Review of Systems See HPI Denies recent fever or chills. Denies sinus pressure, nasal congestion,  or sore throat. Denies chest congestion, productive cough or wheezing. Denies chest pains, palpitations and leg swelling Denies abdominal pain, nausea, vomiting,diarrhea or constipation.   Denies dysuria, frequency, hesitancy or incontinence. Weakness  and limitation in mobility.Uses assistive device due to unsteady gait Denies headaches, seizures, numbness, or tingling. Denies depression, anxiety or insomnia. Denies skin break down or rash.        Objective:   Physical Exam  Patient alert and oriented and in no cardiopulmonary distress.  HEENT: No facial asymmetry, EOMI, no sinus tenderness,  oropharynx pink and moist.  Neck adequate ROM no adenopathy.TM clear bilaterally, right outer ear canal scaly   Chest: Clear to auscultation bilaterally.  CVS: S1, S2 no murmurs, no S3.  ABD: Soft non tender. Bowel sounds normal.  Ext: No edema  MS: decreased  ROM  spine, shoulders, hips and knees.  Skin: Intact, no ulcerations or rash noted.  Psych: Good eye contact, blunt affect. Memorymildly impaired, anxiousbut not r depressed appearing.  CNS: CN 2-12 intact, power,  normal throughout.       Assessment & Plan:

## 2011-07-13 ENCOUNTER — Encounter: Payer: Self-pay | Admitting: Family Medicine

## 2011-07-13 DIAGNOSIS — C755 Malignant neoplasm of aortic body and other paraganglia: Secondary | ICD-10-CM | POA: Insufficient documentation

## 2011-07-13 NOTE — Assessment & Plan Note (Signed)
Controlled, no change in medication  

## 2011-07-13 NOTE — Assessment & Plan Note (Signed)
Uncontrolled, followed by endocrinology

## 2011-07-13 NOTE — Assessment & Plan Note (Signed)
Ear exam negative for acute infection, however pt c/o pain will advise to extend use of antibiotic drops an additional 5 days

## 2011-07-15 ENCOUNTER — Telehealth: Payer: Self-pay | Admitting: Family Medicine

## 2011-07-15 NOTE — Telephone Encounter (Signed)
Called pt and she stated that her heart rate has been running at between 130-114 for the past 3 days.  No shortness of breath or dizziness.  No palpitations.  Also stated that she is not taking asa anymore. Please advise.

## 2011-07-16 ENCOUNTER — Other Ambulatory Visit: Payer: Self-pay | Admitting: Family Medicine

## 2011-07-16 DIAGNOSIS — R002 Palpitations: Secondary | ICD-10-CM

## 2011-07-16 NOTE — Telephone Encounter (Signed)
Pt advised to resume aspirin Advised to go to eD if heart rate uis fast and she is light headed.This has been happening for 3 days.  I am referring her back to cardiology for re evaluation

## 2011-07-19 ENCOUNTER — Ambulatory Visit (HOSPITAL_COMMUNITY): Payer: Medicare HMO

## 2011-07-21 ENCOUNTER — Ambulatory Visit: Payer: Medicare HMO | Admitting: Adult Health

## 2011-07-21 ENCOUNTER — Telehealth: Payer: Self-pay | Admitting: Family Medicine

## 2011-07-22 NOTE — Telephone Encounter (Signed)
Pt states needs something for appetitie

## 2011-07-23 NOTE — Telephone Encounter (Signed)
I recommend she take one multivitamin daily eg centrum, would not recommend anything else at this  Time.  Poor appetite may have other issues which ned to be addressed,

## 2011-07-28 NOTE — Telephone Encounter (Signed)
Patient aware.

## 2011-07-30 ENCOUNTER — Encounter: Payer: Self-pay | Admitting: Adult Health

## 2011-07-30 ENCOUNTER — Ambulatory Visit (INDEPENDENT_AMBULATORY_CARE_PROVIDER_SITE_OTHER): Payer: Medicare HMO | Admitting: Adult Health

## 2011-07-30 VITALS — BP 113/72 | HR 104 | Resp 16 | Ht 61.0 in | Wt 122.0 lb

## 2011-07-30 DIAGNOSIS — I10 Essential (primary) hypertension: Secondary | ICD-10-CM

## 2011-07-30 MED ORDER — VALSARTAN 320 MG PO TABS: 320.0000 mg | ORAL_TABLET | Freq: Every day | ORAL | Status: DC

## 2011-07-30 NOTE — Assessment & Plan Note (Signed)
Blood pressure is soft with tachycardia noted. She has lost 45 lbs since last seen. I have requested labs from Dr. Isidoro Donning office to review. In the interim, I will d/c the HCTZ portion of her combination drug with the diovan. She has also stopped taking amlodipine, on her own, because BP has been consistently less than 95 systolic. I will discontinue this all together. Will see her again in 6 months.  Dr. Lodema Hong and Fransico Him are following her more closely for medical issues.

## 2011-07-30 NOTE — Patient Instructions (Addendum)
Your physician recommends that you schedule a follow-up appointment in: 6 months with Dr Dietrich Pates  Your physician has recommended you make the following change in your medication:  1 - Stop Diovan /HCT and Amlodipine 2 - START Diovan 320 mg daily

## 2011-07-30 NOTE — Progress Notes (Signed)
HPI:  Christine Dominguez is a 64 y/p patient of Dr. Dietrich Pates we are seeing on follow-up for difficult to control hypertension.  She has had medications adjusted by Dr. Dietrich Pates on last visit in May of 2012 and we are evaluating her response. She is having no cardiac issues, but is having issues with diabetes and blood sugar control. She is being referred to Dr. Fransico Him for endocrinology and is to see her next week sometime.  Medication administration times were adjusted to help with dizziness. She is here for follow-up.    Since she has been seen last, she has had radiation surgery on her right ear, for benign ear canal tumor.  She has lost 45 lbs, has lost her appetite with oral thrush. She has been treated for this by Dr. Lodema Hong. Allergies  Allergen Reactions  . Amaryl   . Aspirin     Burning in stomach, nausea  . Avandia (Rosiglitazone Maleate)   . Glimepiride   . Glipizide     Current Outpatient Prescriptions  Medication Sig Dispense Refill  . ACCU-CHEK AVIVA PLUS test strip USE AS DIRECTED TO CHECK BLOOD SUGAR THREE TIMES DAILY AS DIRECTED.  300 each  3  . Alcohol Swabs (B-D SINGLE USE SWABS REGULAR) PADS Use as needed daily with insulin injections  300 each  1  . amLODipine (NORVASC) 5 MG tablet Take 1 tablet (5 mg total) by mouth daily at 12 noon. At noon   90 tablet  1  . aspirin EC 81 MG tablet Take 81 mg by mouth at bedtime.      . cloNIDine (CATAPRES) 0.1 MG tablet Take 1 tablet (0.1 mg total) by mouth 2 (two) times daily.  180 tablet  1  . clotrimazole-betamethasone (LOTRISONE) cream Apply 1 application topically 2 (two) times daily. Apply twice a day fir 10 days to rash , then as needed uses under breast      . cyclobenzaprine (FLEXERIL) 10 MG tablet Take 1 tablet (10 mg total) by mouth at bedtime as needed for muscle spasms.  90 tablet  1  . docusate sodium (COLACE) 100 MG capsule Take 100 mg by mouth every other day.       . ergocalciferol (VITAMIN D2) 50000 UNITS capsule Take  50,000 Units by mouth once a week.      . fluconazole (DIFLUCAN) 100 MG tablet Take 100 mg by mouth daily.      Marland Kitchen gabapentin (NEURONTIN) 100 MG capsule Take 1 capsule (100 mg total) by mouth 2 (two) times daily.  180 capsule  2  . glucose-Vitamin C (DEX4 GLUCOSE) 4-0.006 GM CHEW Chew 1 tablet by mouth as directed.      . insulin glargine (LANTUS SOLOSTAR) 100 UNIT/ML injection Inject 20 Units into the skin at bedtime.        . Iron 66 MG TABS Take 1 tablet by mouth 2 (two) times daily.       Marland Kitchen levothyroxine (SYNTHROID, LEVOTHROID) 50 MCG tablet Take 50 mcg by mouth daily.      . megestrol (MEGACE) 40 MG/ML suspension Take 200 mg by mouth daily.      Marland Kitchen nystatin (NYSTOP) 100000 UNIT/GM POWD Apply topically. Apply twice daily to affected area fr 2 weeks, then as needed for rash      . omeprazole (PRILOSEC) 20 MG capsule Take 1 capsule (20 mg total) by mouth daily as needed. For acid reflex  90 capsule  1  . simvastatin (ZOCOR) 20 MG tablet Take 20 mg  by mouth at bedtime.        . valsartan-hydrochlorothiazide (DIOVAN-HCT) 320-12.5 MG per tablet Take 1 tablet by mouth daily. To replace Losartan/HCTZ  90 tablet  1  . DISCONTD: amLODipine (NORVASC) 5 MG tablet Take 1 tablet (5 mg total) by mouth daily.  90 tablet  2  . DISCONTD: insulin glargine (LANTUS SOLOSTAR) 100 UNIT/ML injection Inject 10 Units into the skin at bedtime.  10 mL  12    Past Medical History  Diagnosis Date  . Chronic back pain 2007    disabled   . Diabetes mellitus 1991  . Hypertension 1980  . Obesity   . Hydrocephalus     treated with VP shunt and then intracranial tumor resection (?Cholesteatoma); no longer followed actively by neurosurgery or otolaryngology  . Cancer 1998, recurred in 2012    paraganglioma of jugular tympamicum, recurrent in 2012    Past Surgical History  Procedure Date  . Ventriculoperitoneal shunt 1998    at [facility]  . Intracranial mass resected 1998    at [facility]; ? location-right middle ear  .  Hemorrhoidectomy   . Trigger finger release     right thumb  . Tubal ligation 1980  . Colonoscopy 06/2010    RUE:AVWUJW of systems complete and found to be negative unless listed above PHYSICAL EXAM There were no vitals taken for this visit. General: Well developed, well nourished, in no acute distress Head: Eyes PERRLA, some right eye deviation to the left. Wears glasses. No xanthomas.   Normal cephalic and atramatic  Lungs: Clear bilaterally to auscultation and percussion. Heart: HRRR S1 S2, tachycardic, pulses 2+ & equal.            No carotid bruit. No JVD.  No abdominal bruits. No femoral bruits. Abdomen: Bowel sounds are positive, abdomen soft and non-tender without masses or                  Hernia's noted. Msk:  Back normal, normal gait. Diminished strength and tone for age. Extremities: No clubbing, cyanosis or edema.  DP +1 Neuro: Alert and oriented X 3. Psych:  Good affect, responds appropriately    ASSESSMENT AND PLAN

## 2011-08-03 ENCOUNTER — Telehealth: Payer: Self-pay | Admitting: Adult Health

## 2011-08-03 ENCOUNTER — Other Ambulatory Visit: Payer: Self-pay

## 2011-08-03 NOTE — Telephone Encounter (Signed)
Patient needs new RX for Diovan 320mg  sent to [facility].  Please call patient when this is done. / tg

## 2011-08-03 NOTE — Telephone Encounter (Signed)
**Note De-Identified  Obfuscation** RX sent to [facility] to fill, pt. aware./LV

## 2011-08-05 ENCOUNTER — Other Ambulatory Visit: Payer: Self-pay

## 2011-08-05 ENCOUNTER — Telehealth: Payer: Self-pay | Admitting: Adult Health

## 2011-08-05 ENCOUNTER — Other Ambulatory Visit: Payer: Self-pay | Admitting: *Deleted

## 2011-08-05 MED ORDER — VALSARTAN 320 MG PO TABS: 320.0000 mg | ORAL_TABLET | Freq: Every day | ORAL | Status: DC

## 2011-08-05 NOTE — Telephone Encounter (Signed)
**Note De-Identified  Obfuscation** RX sent to [facility] to fill./LV

## 2011-08-05 NOTE — Telephone Encounter (Signed)
PT STATES THAT MEDS WERE CHANGED AT LAST VISIT AND SHE NEEDS THE NEW RX CALLED IN TO [facility].

## 2011-08-24 ENCOUNTER — Ambulatory Visit (HOSPITAL_COMMUNITY)
Admission: RE | Admit: 2011-08-24 | Discharge: 2011-08-24 | Disposition: A | Discharge status: Home / Self Care | Payer: Medicare HMO | Source: Ambulatory Visit | Attending: Family Medicine | Admitting: Family Medicine

## 2011-08-24 DIAGNOSIS — Z1231 Encounter for screening mammogram for malignant neoplasm of breast: Secondary | ICD-10-CM | POA: Insufficient documentation

## 2011-08-24 DIAGNOSIS — Z139 Encounter for screening, unspecified: Secondary | ICD-10-CM

## 2011-09-14 ENCOUNTER — Telehealth: Payer: Self-pay | Admitting: Adult Health

## 2011-09-14 NOTE — Telephone Encounter (Signed)
Can take HCTZ prn only. May write RX for this at 12.5 mg.

## 2011-09-14 NOTE — Telephone Encounter (Signed)
Patient states that she is having fluid gain and would like to speak with nurse. / tg

## 2011-09-14 NOTE — Telephone Encounter (Signed)
**Note De-Identified  Obfuscation** On last OV with Christine Reining, NP on 4-26 pt. Was advised to stop taking Amlodipine and Diovan/HCTZ 320/12.5 mg and to only take Diovan 320 mg daily (with out HCTZ). Pt. c/o swelling in her legs and feet. She states she elevates feet at night but they swell up again throughout the day.  Please advise./LV

## 2011-09-15 MED ORDER — HYDROCHLOROTHIAZIDE 12.5 MG PO CAPS: 12.5000 mg | ORAL_CAPSULE | ORAL | Status: DC | PRN

## 2011-09-15 NOTE — Telephone Encounter (Signed)
**Note De-identified  Obfuscation** Pt. Advised, she verbalized understanding./LV 

## 2011-09-27 ENCOUNTER — Telehealth: Payer: Self-pay | Admitting: Cardiology

## 2011-09-27 ENCOUNTER — Other Ambulatory Visit: Payer: Self-pay

## 2011-09-27 MED ORDER — HYDROCHLOROTHIAZIDE 12.5 MG PO CAPS: 12.5000 mg | ORAL_CAPSULE | ORAL | Status: DC | PRN

## 2011-09-27 NOTE — Telephone Encounter (Signed)
**Note De-Identified  Obfuscation** Correct RX sent to [facility] to fill./LV

## 2011-09-27 NOTE — Telephone Encounter (Signed)
Patient states that she received call from [facility] that her dosage for HCTZ was not right.  Wants to know if you can call to clarify the dose. / tg

## 2011-10-20 ENCOUNTER — Other Ambulatory Visit: Payer: Self-pay | Admitting: Family Medicine

## 2011-11-02 ENCOUNTER — Ambulatory Visit: Payer: Medicare HMO | Admitting: Family Medicine

## 2011-11-03 ENCOUNTER — Ambulatory Visit (INDEPENDENT_AMBULATORY_CARE_PROVIDER_SITE_OTHER): Payer: Medicare HMO | Admitting: Family Medicine

## 2011-11-03 ENCOUNTER — Encounter: Payer: Self-pay | Admitting: Family Medicine

## 2011-11-03 VITALS — BP 150/90 | HR 104 | Resp 18 | Ht 61.5 in | Wt 129.4 lb

## 2011-11-03 DIAGNOSIS — E785 Hyperlipidemia, unspecified: Secondary | ICD-10-CM

## 2011-11-03 DIAGNOSIS — K219 Gastro-esophageal reflux disease without esophagitis: Secondary | ICD-10-CM | POA: Insufficient documentation

## 2011-11-03 DIAGNOSIS — E039 Hypothyroidism, unspecified: Secondary | ICD-10-CM

## 2011-11-03 DIAGNOSIS — C755 Malignant neoplasm of aortic body and other paraganglia: Secondary | ICD-10-CM

## 2011-11-03 DIAGNOSIS — I10 Essential (primary) hypertension: Secondary | ICD-10-CM

## 2011-11-03 DIAGNOSIS — E119 Type 2 diabetes mellitus without complications: Secondary | ICD-10-CM

## 2011-11-03 MED ORDER — OMEPRAZOLE 20 MG PO CPDR: 20.0000 mg | DELAYED_RELEASE_CAPSULE | Freq: Two times a day (BID) | ORAL | Status: DC

## 2011-11-03 MED ORDER — NYSTATIN 100000 UNIT/GM EX POWD: CUTANEOUS | Status: DC

## 2011-11-03 MED ORDER — CLOTRIMAZOLE-BETAMETHASONE 1-0.05 % EX CREA: 1.0000 "application " | TOPICAL_CREAM | Freq: Two times a day (BID) | CUTANEOUS | Status: DC

## 2011-11-03 NOTE — Patient Instructions (Addendum)
Annual wellness in January  Call in October for flu vaccine.  Keep appt for pap  Please bring all medication to next visit   Blood pressure is a bit high today, please keep f/u appt with cardiology about this.  [facility] for skin cream and powder.  Reflux is increased to twice daily per your  Request  You are doing better

## 2011-11-14 DIAGNOSIS — E039 Hypothyroidism, unspecified: Secondary | ICD-10-CM | POA: Insufficient documentation

## 2011-11-14 DIAGNOSIS — E782 Mixed hyperlipidemia: Secondary | ICD-10-CM | POA: Insufficient documentation

## 2011-11-14 NOTE — Assessment & Plan Note (Signed)
Followed by endo, adequately controlled

## 2011-11-14 NOTE — Assessment & Plan Note (Signed)
On supplemental thyroid med, followed by endo

## 2011-11-14 NOTE — Assessment & Plan Note (Signed)
Pt improved since last visit. Appetite is improved, less weak and she has gained weight

## 2011-11-14 NOTE — Assessment & Plan Note (Signed)
Hyperlipidemia:Low fat diet discussed and encouraged.  Updated lab needed 

## 2011-11-14 NOTE — Progress Notes (Signed)
  Subjective:    Patient ID: Christine Dominguez, female    DOB: 30-Jan-1951, 61 y.o.   MRN: [id number]  HPI The PT is here for follow up and re-evaluation of chronic medical conditions, medication management and review of any available recent lab and radiology data.  Preventive health is updated, specifically  Cancer screening and Immunization.   Questions or concerns regarding consultations or procedures which the PT has had in the interim are  addressed. The PT denies any adverse reactions to current medications since the last visit.  There are no new concerns.  C/o uncontrolled reflux symptoms , requests dose increase on medication for this. States blood sugar i well controlled, and she continues to follow with endo Appetite is gradually improving and she has gained some weight since her last visit      Review of Systems    See HPI Denies recent fever or chills. Denies sinus pressure, nasal congestion, ear pain or sore throat. Denies chest congestion, productive cough or wheezing. Denies chest pains, palpitations and leg swelling Denies  nausea, vomiting,diarrhea or constipation.   Denies dysuria, frequency, hesitancy or incontinence. Denies joint pain, swelling and limitation in mobility. Denies headaches, seizures, numbness, or tingling. Denies depression, anxiety or insomnia. Denies skin break down or rash.Uses barrier cream as needed     Objective:   Physical Exam Patient alert and oriented and in no cardiopulmonary distress.  HEENT:  facial asymmetry, EOMI, no sinus tenderness,  oropharynx pink and moist.  Neck decreased ROM no adenopathy.  Chest: Clear to auscultation bilaterally.  CVS: S1, S2 no murmurs, no S3.  ABD: Soft non tender. Bowel sounds normal.  Ext: No edema  MS: Adequate ROM spine, shoulders, hips and knees.  Skin: Intact, no ulcerations or rash noted.  Psych: Good eye contact, normal affect. Memory intact not anxious or depressed  appearing.  CNS: CN 2-12 intact, power normal throughout.        Assessment & Plan:

## 2011-11-14 NOTE — Assessment & Plan Note (Signed)
Elevated at this visit , but has had a long history of significant instability in blood pressure , will leave cardiology to make any adjustments deemed necessary

## 2011-11-14 NOTE — Assessment & Plan Note (Signed)
Uncontrolled symptoms , increase in dose of med per pt request

## 2011-11-24 ENCOUNTER — Telehealth: Payer: Self-pay

## 2011-11-24 NOTE — Telephone Encounter (Signed)
Went to the oncologist yesterday and she has been having trouble with her left arm and they recommended she see her PCP to be set up for physical therapy. I told her that Dr. Lodema Hong was out the office until Wed and she said that was fine

## 2011-12-01 ENCOUNTER — Other Ambulatory Visit: Payer: Self-pay | Admitting: Family Medicine

## 2011-12-01 DIAGNOSIS — M79603 Pain in arm, unspecified: Secondary | ICD-10-CM

## 2011-12-01 NOTE — Telephone Encounter (Signed)
pls let her know i am referring her ,I will enter the order

## 2011-12-03 NOTE — Telephone Encounter (Signed)
Patient aware.

## 2011-12-09 ENCOUNTER — Ambulatory Visit (HOSPITAL_COMMUNITY)
Admission: RE | Admit: 2011-12-09 | Discharge: 2011-12-09 | Disposition: A | Discharge status: Home / Self Care | Payer: Medicare HMO | Source: Ambulatory Visit | Attending: Family Medicine | Admitting: Family Medicine

## 2011-12-09 DIAGNOSIS — M25519 Pain in unspecified shoulder: Secondary | ICD-10-CM | POA: Insufficient documentation

## 2011-12-09 DIAGNOSIS — M25619 Stiffness of unspecified shoulder, not elsewhere classified: Secondary | ICD-10-CM | POA: Insufficient documentation

## 2011-12-09 DIAGNOSIS — IMO0001 Reserved for inherently not codable concepts without codable children: Secondary | ICD-10-CM | POA: Insufficient documentation

## 2011-12-09 DIAGNOSIS — M6281 Muscle weakness (generalized): Secondary | ICD-10-CM | POA: Insufficient documentation

## 2011-12-09 DIAGNOSIS — E119 Type 2 diabetes mellitus without complications: Secondary | ICD-10-CM | POA: Insufficient documentation

## 2011-12-09 DIAGNOSIS — I10 Essential (primary) hypertension: Secondary | ICD-10-CM | POA: Insufficient documentation

## 2011-12-09 NOTE — Evaluation (Signed)
Occupational Therapy Evaluation  Patient Details  Name: Christine Dominguez MRN: [id number] Date of Birth: 19-Dec-1950  Today's Date: 12/09/2011 Time: 0865-7846 OT Time Calculation (min): 35 min OT Evaluation 1310-1325 15' Manual Therapy 1325-1340 15' Visit#: 1  of 24   Re-eval: 01/06/12  Assessment Diagnosis: Left Arm Pain Prior Therapy: not for this diagnosis  Authorization: [employer] medicare requested 24 visits  Authorization Time Period: g code before 10th visit  Authorization Visit#: 1  of 10    Past Medical History:  Past Medical History  Diagnosis Date  . Chronic back pain 2007    disabled   . Diabetes mellitus 1991  . Hypertension 1980  . Obesity   . Hydrocephalus     treated with VP shunt and then intracranial tumor resection (?Cholesteatoma); no longer followed actively by neurosurgery or otolaryngology  . 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 Ventriculoperitoneal shunt 1998    at [facility]  . Intracranial mass resected 1998    at [facility]; ? location-right middle ear  . Hemorrhoidectomy   . Trigger finger release     right thumb  . Tubal ligation 1980  . Colonoscopy 06/2010    Subjective S:  I had radiation on my right ear for 9 weeks, but the position they had me in and how I got up from the table made my left arm sore. Pertinent History: [name] received radiation for a benign tumor in her right ear for 9 weeks, ending in February of this year.  The position she assumed for radiation, as well as getting up and down from the table caused pain in her left shoulder.  She has been referred to occupational therapy for evaluation and treatment.   Special Tests: UEFI:  31/80 = 39% I level with use of LUE with daily tasks. Patient Stated Goals: I want to get my arm straight again.  I want to be able to use it.  Pain Assessment Currently in  Pain?: Yes Pain Score:   4 Pain Location: Shoulder Pain Orientation: Left;Anterior;Upper Pain Type: Acute pain  Precautions/Restrictions  Precautions Precautions: None;Other (comment) (Shunt along L trapezius/cervical region, avoid during MT)  Prior Functioning  Home Living Lives With: Spouse Prior Function Level of Independence: Independent with basic ADLs;Independent with homemaking with ambulation Driving: Yes Vocation: Retired Leisure: Hobbies-yes (Comment) Comments: Patient is very active at church, photography, [place] Parkin of a black history book, cooking, making chow chow, baking, staying active  Assessment ADL/Vision/Perception ADL ADL Comments: Patient is not able to use her left arm to comb her hair, don her bra, take shirts off over her head.  She is not able to lift anything heavy and is having difficulty sleeping on her left side.  Dominant Hand: Right Vision - History Baseline Vision: Wears glasses all the time  Cognition/Observation Cognition Overall Cognitive Status: Appears within functional limits for tasks assessed  Sensation/Coordination/Edema Sensation Light Touch: Appears Intact Coordination Gross Motor Movements are Fluid and Coordinated: Yes Fine Motor Movements are Fluid and Coordinated: Yes  Additional Assessments LUE AROM (degrees) LUE Overall AROM Comments: assessed in seate Left Shoulder Flexion: 65 Degrees Left Shoulder ABduction: 60 Degrees Left Shoulder Internal Rotation: 88 Degrees Left Shoulder External Rotation: 5 Degrees LUE PROM (degrees) LUE Overall PROM Comments: limited to 50% in supine  LUE Strength LUE Overall Strength Comments: 3+/5 flex, abd, IR.  ER not assessed  due to limited movement Palpation Palpation: Max fascial restrictions in upper arm and scapular region.  Tenderness in collar bone region.       Exercise/Treatments     Manual Therapy Manual Therapy: Myofascial release Myofascial Release: MFR and manual  stretching to left upper anterior and lateral arm, clavicle, and scapular region to decrease pain and restrictions and improve mobility.  1610-9604  Occupational Therapy Assessment and Plan OT Assessment and Plan Clinical Impression Statement: A:  61 year old female with decreased mobility and strength and increased pain and restrictions in her left shoulder x 6 months.  Deficits are causing decreased I with B/IADLs and leisure activities.   Pt will benefit from skilled therapeutic intervention in order to improve on the following deficits: Decreased range of motion;Decreased strength;Increased fascial restricitons;Pain;Increased muscle spasms;Impaired UE functional use Rehab Potential: Excellent OT Frequency: Min 3X/week OT Duration: 8 weeks OT Treatment/Interventions: Self-care/ADL training;Therapeutic exercise;Therapeutic activities;Manual therapy;Modalities;Patient/family education OT Plan: P:  Skilled OT intervention to decrease pain and restrictions and increase AROM and strength in LUE in order to return to PLOF with daily activities.  Treatment Plan:  MFR and manual stretching to left shoulder region.  PROM in supine.  bridges in supine.  isometric strengthening in supine.  Seated ball stretches, active elevation, ext, row.  Progress to [facility] when 80-90% PROM achieved.     Goals Short Term Goals Time to Complete Short Term Goals: 4 weeks Short Term Goal 1: Patient will be educated on a HEP. Short Term Goal 2: Patient will increase PROM in left shoulder to [facility] for increased ability to comb her hair. Short Term Goal 3: Patient will increase strength in her left shoulder to 4-/5 for increased ability to pick up supplies when cooking. Short Term Goal 4: Patient will decrease pain in her left shoulder to 3/10 when cooking or taking photographs. Short Term Goal 5: Patient will decrease fascial restrictions in her left shoulder to moderate for increased comfort when sleeping. Long Term  Goals Time to Complete Long Term Goals: 8 weeks Long Term Goal 1: Patient will return to prior level of I with all B/IADLs and leisure activities.  Long Term Goal 2: Patient will increase AROM in her left shoulder to [facility] for increased ability to reach into overhead cabinets. Long Term Goal 3: Patient will increase her left shoulder strength to 4+/5 for increased ability to lift bags of groceries. Long Term Goal 4: Patient will decrease pain in her left shoulder to 1/10 when completing functional activities. Long Term Goal 5: Patient will decrease fascial restrictions to minimal for increased comfort when sleeping on left side.   Problem List Patient Active Problem List  Diagnosis  . CARPAL TUNNEL SYNDROME  . HYPERTENSION  . BACK PAIN  . DIABETES MELLITUS, TYPE II  . Light-headedness  . Hearing loss in right ear  . Malignant paraganglioma  . GERD (gastroesophageal reflux disease)  . Hypothyroid  . Hyperlipidemia LDL goal < 130  . Pain in joint, shoulder region  . Muscle weakness (generalized)    End of Session Activity Tolerance: Patient tolerated treatment well General Behavior During Session: [facility] for tasks performed Cognition: [facility] for tasks performed OT Plan of Care OT Home Exercise Plan: Educated on shoulder stretches and towel slides.   Consulted and Agree with Plan of Care: Patient  GO Functional Assessment Tool Used: UEFI scored 31/80 = 39% I level, 61% disability level Functional Limitation: Carrying, moving and handling objects Carrying, Moving and Handling Objects Current Status (  [id number]): At least 60 percent but less than 80 percent impaired, limited or restricted Carrying, Moving and Handling Objects Goal Status [phone]): At least 1 percent but less than 20 percent impaired, limited or restricted  Shirlean Mylar, OTR/L  12/09/2011, 2:20 PM  Physician Documentation Your signature is required to indicate approval of the treatment plan as stated above.  Please sign  and either send electronically or make a copy of this report for your files and return this physician signed original.  Please mark one 1.__approve of plan  2. ___approve of plan with the following conditions.   ______________________________                                                          _____________________ Physician Signature                                                                                                             Date

## 2011-12-13 ENCOUNTER — Inpatient Hospital Stay (HOSPITAL_COMMUNITY): Admission: RE | Admit: 2011-12-13 | Payer: Medicare HMO | Source: Ambulatory Visit | Admitting: Occupational Therapy

## 2011-12-15 ENCOUNTER — Ambulatory Visit (HOSPITAL_COMMUNITY)
Admission: RE | Admit: 2011-12-15 | Discharge: 2011-12-15 | Disposition: A | Discharge status: Home / Self Care | Payer: Medicare HMO | Source: Ambulatory Visit | Attending: Family Medicine | Admitting: Family Medicine

## 2011-12-15 DIAGNOSIS — M6281 Muscle weakness (generalized): Secondary | ICD-10-CM

## 2011-12-15 DIAGNOSIS — M25519 Pain in unspecified shoulder: Secondary | ICD-10-CM

## 2011-12-15 NOTE — Progress Notes (Signed)
Occupational Therapy Treatment Patient Details  Name: Christine Dominguez MRN: [id number] Date of Birth: 04-26-1950  Today's Date: 12/15/2011 Time: 4098-1191 OT Time Calculation (min): 32 min Manual Therapy 4782-9562 22' Therapeutic Exercise 1308-6578 9' Visit#: 2  of 24   Re-eval: 01/06/12 Assessment Diagnosis: Left Arm Pain  Authorization: [employer] medicare requested 24 visits   Authorization Time Period: g code before 10th visit   Authorization Visit#: 2  of 10   Subjective Symptoms/Limitations Symptoms: S:  I have a lot of trouble sleeping, this arm hurts, I had to take a pain pill. Pain Assessment Currently in Pain?: Yes Pain Score:   3  Precautions/Restrictions  Precautions Precautions: None;Other (comment)Shunt along L trapezius/cervical region, avoid during MT)      Exercise/Treatments Supine Protraction: PROM;10 reps Horizontal ABduction: PROM;10 reps External Rotation: PROM;10 reps Internal Rotation: PROM;10 reps Flexion: PROM;10 reps ABduction: PROM;10 reps Other Supine Exercises: bridges x 10 Seated Elevation: AROM;10 reps Extension: AROM;10 reps Row: AROM;10 reps Therapy Ball Flexion: 15 reps ABduction: 15 reps ROM / Strengthening / Isometric Strengthening   Flexion: 3X3" Extension: 3X3" External Rotation: 3X3" Internal Rotation: 3X3" ABduction: 3X3" ADduction: 3X3"      Manual Therapy Manual Therapy: Myofascial release Myofascial Release: MFR and manual stretching to left upper anterior and lateral arm, clavicle, and scapular region to decrease pain and restrictions and improve mobility.  Avoiding Manual over shunt area.  Occupational Therapy Assessment and Plan OT Assessment and Plan Clinical Impression Statement: A: Added bridges and isometrics supine, scapular AROM seated, and ball stretches. Patient with areas of tenderness in upper arm and along scapula and very limited with PROM. No increase in pain from new exercises but pain did  not decrease either. Patient states she uses heat at home and that helps her pain some OT Plan: P:  Attempt to increase PROM by 5 degrees, increase hold with isometrics.   Goals Short Term Goals Time to Complete Short Term Goals: 4 weeks Short Term Goal 1: Patient will be educated on a HEP. Short Term Goal 2: Patient will increase PROM in left shoulder to [facility] for increased ability to comb her hair. Short Term Goal 3: Patient will increase strength in her left shoulder to 4-/5 for increased ability to pick up supplies when cooking. Short Term Goal 4: Patient will decrease pain in her left shoulder to 3/10 when cooking or taking photographs. Short Term Goal 5: Patient will decrease fascial restrictions in her left shoulder to moderate for increased comfort when sleeping. Long Term Goals Time to Complete Long Term Goals: 8 weeks Long Term Goal 1: Patient will return to prior level of I with all B/IADLs and leisure activities.  Long Term Goal 2: Patient will increase AROM in her left shoulder to [facility] for increased ability to reach into overhead cabinets. Long Term Goal 3: Patient will increase her left shoulder strength to 4+/5 for increased ability to lift bags of groceries. Long Term Goal 4: Patient will decrease pain in her left shoulder to 1/10 when completing functional activities. Long Term Goal 5: Patient will decrease fascial restrictions to minimal for increased comfort when sleeping on left side.   Problem List Patient Active Problem List  Diagnosis  . CARPAL TUNNEL SYNDROME  . HYPERTENSION  . BACK PAIN  . DIABETES MELLITUS, TYPE II  . Light-headedness  . Hearing loss in right ear  . Malignant paraganglioma  . GERD (gastroesophageal reflux disease)  . Hypothyroid  . Hyperlipidemia LDL goal < 130  .  Pain in joint, shoulder region  . Muscle weakness (generalized)    End of Session Activity Tolerance: Patient tolerated treatment well General Behavior During Session: [facility] for  tasks performed Cognition: [facility] for tasks performed  GO     Jazell Rosenau L. [place], COTA/L  12/15/2011, 12:01 PM

## 2011-12-17 ENCOUNTER — Ambulatory Visit (HOSPITAL_COMMUNITY)
Admission: RE | Admit: 2011-12-17 | Discharge: 2011-12-17 | Disposition: A | Discharge status: Home / Self Care | Payer: Medicare HMO | Source: Ambulatory Visit | Attending: Family Medicine | Admitting: Family Medicine

## 2011-12-17 DIAGNOSIS — M6281 Muscle weakness (generalized): Secondary | ICD-10-CM

## 2011-12-17 DIAGNOSIS — M25519 Pain in unspecified shoulder: Secondary | ICD-10-CM

## 2011-12-17 NOTE — Progress Notes (Addendum)
Occupational Therapy Treatment Patient Details  Name: Christine Dominguez MRN: [id number] Date of Birth: 12-11-1950  Today's Date: 12/17/2011 Time: 4098-1191 OT Time Calculation (min): 52 min Manual Therapy [id number] 23' Therapeutic Exercise 1130-1142 12' Moist heat 1308-6578 15'  Visit#: 3  of 24   Re-eval: 01/06/12 Assessment Diagnosis: Left Arm Pain  Authorization: [employer] requested 24 visits   Authorization Time Period: g code before 10th visit   Authorization Visit#: 3  of 10   Subjective Symptoms/Limitations Symptoms: S:  I laid with the heating pad last night, it helps me sleep. Pain Assessment Currently in Pain?: Yes Pain Score:   5 Pain Location: Shoulder Pain Orientation: Left Pain Type: Acute pain  Precautions/Restrictions  Precautions Precautions: None;Other (comment) ((Shunt along L trapezius/cervical region, avoid during MT))  Exercise/Treatments Supine Protraction: PROM;10 reps Horizontal ABduction: PROM;10 reps External Rotation: PROM;10 reps Internal Rotation: PROM;10 reps Flexion: PROM;10 reps ABduction: PROM;10 reps Other Supine Exercises: bridges x 15 Seated Elevation: AROM;10 reps Extension: AROM;10 reps Row: AROM;10 reps Therapy Ball Flexion: 20 reps ABduction: 20 reps ROM / Strengthening / Isometric Strengthening   Flexion: 3X3" Extension: 3X3" External Rotation: 3X3" Internal Rotation: 3X3" ABduction: 3X3" ADduction: 3X3"        Modalities Modalities: Moist Heat Manual Therapy Manual Therapy: Myofascial release Myofascial Release: MFR and manual stretching to left upper anterior and lateral arm, clavicle, and scapular region to decrease pain and restrictions and improve mobility. Avoiding Manual over shunt area Moist Heat Therapy Number Minutes Moist Heat: 15 Minutes Moist Heat Location: Shoulder  Occupational Therapy Assessment and Plan OT Assessment and Plan Clinical Impression Statement: A:  Patient very  limited with PROM secondary to pain.  Patient continues with tenderness and tightness in upper arm restricting her movemet.  Patient had no pain after heat/ OT Plan: P:  Increase hold for isometrics and attempt to increase PROM   Goals Short Term Goals Time to Complete Short Term Goals: 4 weeks Short Term Goal 1: Patient will be educated on a HEP. Short Term Goal 2: Patient will increase PROM in left shoulder to [facility] for increased ability to comb her hair. Short Term Goal 3: Patient will increase strength in her left shoulder to 4-/5 for increased ability to pick up supplies when cooking. Short Term Goal 4: Patient will decrease pain in her left shoulder to 3/10 when cooking or taking photographs. Short Term Goal 5: Patient will decrease fascial restrictions in her left shoulder to moderate for increased comfort when sleeping. Long Term Goals Time to Complete Long Term Goals: 8 weeks Long Term Goal 1: Patient will return to prior level of I with all B/IADLs and leisure activities.  Long Term Goal 2: Patient will increase AROM in her left shoulder to [facility] for increased ability to reach into overhead cabinets. Long Term Goal 3: Patient will increase her left shoulder strength to 4+/5 for increased ability to lift bags of groceries. Long Term Goal 4: Patient will decrease pain in her left shoulder to 1/10 when completing functional activities. Long Term Goal 5: Patient will decrease fascial restrictions to minimal for increased comfort when sleeping on left side.   Problem List Patient Active Problem List  Diagnosis  . CARPAL TUNNEL SYNDROME  . HYPERTENSION  . BACK PAIN  . DIABETES MELLITUS, TYPE II  . Light-headedness  . Hearing loss in right ear  . Malignant paraganglioma  . GERD (gastroesophageal reflux disease)  . Hypothyroid  . Hyperlipidemia LDL goal < 130  . Pain  in joint, shoulder region  . Muscle weakness (generalized)    End of Session Activity Tolerance: Patient tolerated  treatment well General Behavior During Session: [facility] for tasks performed Cognition: [facility] for tasks performed  GO    Daja Shuping L. Yusra Ravert, COTA/L  12/17/2011, 2:43 PM

## 2011-12-20 ENCOUNTER — Ambulatory Visit (HOSPITAL_COMMUNITY)
Admission: RE | Admit: 2011-12-20 | Discharge: 2011-12-20 | Disposition: A | Discharge status: Home / Self Care | Payer: Medicare HMO | Source: Ambulatory Visit | Attending: Family Medicine | Admitting: Family Medicine

## 2011-12-20 DIAGNOSIS — M6281 Muscle weakness (generalized): Secondary | ICD-10-CM

## 2011-12-20 DIAGNOSIS — M25519 Pain in unspecified shoulder: Secondary | ICD-10-CM

## 2011-12-20 NOTE — Progress Notes (Signed)
Occupational Therapy Treatment Patient Details  Name: Christine Dominguez MRN: [id number] Date of Birth: Nov 28, 1950  Today's Date: 12/20/2011 Time: 8413-2440 OT Time Calculation (min): 32 min Manual Therapy [id number] 17' Therapeutic Exercises [phone] 15' Visit#: 4  of 24   Re-eval: 01/06/12    Authorization: [employer] requested 24 visits   Authorization Time Period: g code before 10th visit   Authorization Visit#: 4  of 10   Subjective S:  I think therapy is helping, but the massage technique can make me sore.  (OT instructed patient to let therapist know if massage is too heavy or hard) Pain Assessment Currently in Pain?: Yes Pain Score:   4 Pain Location: Shoulder Pain Orientation: Left Pain Type: Acute pain  Precautions/Restrictions   Shunt along L trapezius/cervical region, avoid during MT   Exercise/Treatments Supine Protraction: PROM;10 reps Horizontal ABduction: PROM;10 reps External Rotation: PROM;10 reps Internal Rotation: PROM;10 reps Flexion: PROM;10 reps ABduction: PROM;10 reps Other Supine Exercises: bridges x 20 Seated Elevation: AROM;12 reps Extension: AROM;12 reps Row: AROM;12 reps Therapy Ball Flexion: 20 reps;Limitations Flexion Limitations: min tactile cues to maintain alignment ABduction: 20 reps;Limitations ABduction Limitations: min tactile cues to maintain alignment ROM / Strengthening / Isometric Strengthening   Flexion: 5X5";Supine Extension: 5X5";Supine External Rotation: 5X5";Supine Internal Rotation: 5X5";Supine ABduction: 5X5";Supine ADduction: 5X5";Supine    Manual Therapy Manual Therapy: Myofascial release Myofascial Release: MFR and manual stretching to left upper anterior and lateral arm, clavicle, and scapular region to decrease pain and restrictions and improve mobility. Avoiding Manual over shunt area [phone]  Occupational Therapy Assessment and Plan OT Assessment and Plan Clinical Impression Statement:  A:  PROM continues to be quite limited.  Patient is sensitive to massage/MFR technique.  Increased contraction time during isometric strengthening. OT Plan: P:  Increase PROM by 10, add elbow-wrist AROM exercises, less cuing with ball stretches.    Goals Short Term Goals Time to Complete Short Term Goals: 4 weeks Short Term Goal 1: Patient will be educated on a HEP. Short Term Goal 1 Progress: Progressing toward goal Short Term Goal 2: Patient will increase PROM in left shoulder to [facility] for increased ability to comb her hair. Short Term Goal 2 Progress: Progressing toward goal Short Term Goal 3: Patient will increase strength in her left shoulder to 4-/5 for increased ability to pick up supplies when cooking. Short Term Goal 3 Progress: Progressing toward goal Short Term Goal 4: Patient will decrease pain in her left shoulder to 3/10 when cooking or taking photographs. Short Term Goal 4 Progress: Progressing toward goal Short Term Goal 5: Patient will decrease fascial restrictions in her left shoulder to moderate for increased comfort when sleeping. Short Term Goal 5 Progress: Progressing toward goal Long Term Goals Time to Complete Long Term Goals: 8 weeks Long Term Goal 1: Patient will return to prior level of I with all B/IADLs and leisure activities.  Long Term Goal 1 Progress: Progressing toward goal Long Term Goal 2: Patient will increase AROM in her left shoulder to [facility] for increased ability to reach into overhead cabinets. Long Term Goal 2 Progress: Progressing toward goal Long Term Goal 3: Patient will increase her left shoulder strength to 4+/5 for increased ability to lift bags of groceries. Long Term Goal 3 Progress: Progressing toward goal Long Term Goal 4: Patient will decrease pain in her left shoulder to 1/10 when completing functional activities. Long Term Goal 4 Progress: Progressing toward goal Long Term Goal 5: Patient will decrease fascial restrictions to minimal  for  increased comfort when sleeping on left side.  Long Term Goal 5 Progress: Progressing toward goal  Problem List Patient Active Problem List  Diagnosis  . CARPAL TUNNEL SYNDROME  . HYPERTENSION  . BACK PAIN  . DIABETES MELLITUS, TYPE II  . Light-headedness  . Hearing loss in right ear  . Malignant paraganglioma  . GERD (gastroesophageal reflux disease)  . Hypothyroid  . Hyperlipidemia LDL goal < 130  . Pain in joint, shoulder region  . Muscle weakness (generalized)       GO    Shirlean Mylar, OTR/L  12/20/2011, 11:55 AM

## 2011-12-22 ENCOUNTER — Ambulatory Visit (HOSPITAL_COMMUNITY)
Admission: RE | Admit: 2011-12-22 | Discharge: 2011-12-22 | Disposition: A | Discharge status: Home / Self Care | Payer: Medicare HMO | Source: Ambulatory Visit | Attending: Family Medicine | Admitting: Family Medicine

## 2011-12-22 DIAGNOSIS — M25519 Pain in unspecified shoulder: Secondary | ICD-10-CM

## 2011-12-22 DIAGNOSIS — M6281 Muscle weakness (generalized): Secondary | ICD-10-CM

## 2011-12-22 NOTE — Progress Notes (Signed)
Occupational Therapy Treatment Patient Details  Name: Christine Dominguez MRN: [id number] Date of Birth: July 05, 1950  Today's Date: 12/22/2011 Time: 4098-1191 OT Time Calculation (min): 42 min Manual Therapy [id number]  (completed by Leia Alf) 22' Therapeutic Exercise [phone] 19'  Visit#: 5  of 24   Re-eval: 01/06/12    Authorization: [employer] requested 24 visits   Authorization Time Period: g code before 10th visit   Authorization Visit#: 5  of 10   Subjective Symptoms/Limitations Symptoms: S:  Its still really tight.   Pain Assessment Currently in Pain?: Yes Pain Score:   5 Pain Location: Shoulder Pain Orientation: Left Pain Type: Acute pain  Precautions/Restrictions     Exercise/Treatments Supine Protraction: PROM;10 reps Horizontal ABduction: PROM;10 reps External Rotation: PROM;10 reps Internal Rotation: PROM;10 reps Flexion: PROM;10 reps ABduction: PROM;10 reps Other Supine Exercises: bridges x 20 Seated Elevation: AROM;12 reps Extension: AROM;12 reps Row: AROM;12 reps Therapy Ball Flexion: 20 reps;Limitations Flexion Limitations: one cue initially to remind to keep shoulder depressed ABduction: 20 reps;Limitations ABduction Limitations: one cue initially to keep shoulder depressed ROM / Strengthening / Isometric Strengthening   Flexion: 5X5";Supine Extension: 5X5";Supine External Rotation: 5X5";Supine Internal Rotation: 5X5";Supine ABduction: 5X5";Supine ADduction: 5X5";Supine     Manual Therapy Manual Therapy: Myofascial release Myofascial Release: MFR and manual stretching to left upper anterior and lateral arm, clavicle, and scapular region to decrease pain and restrictions and improve mobility. Avoiding Manual over shunt area.  [id number] completed by Leia Alf, OTR/L  Occupational Therapy Assessment and Plan OT Assessment and Plan Clinical Impression Statement: A:  Patient with some soreness/slight pain with isometric  extension but not with others.  Decreased cues needed with ball just a reminder initially to keep shoulder depressed OT Plan: P:  Increase PROM to allow for AAROM.   Goals Short Term Goals Time to Complete Short Term Goals: 4 weeks Short Term Goal 1: Patient will be educated on a HEP. Short Term Goal 2: Patient will increase PROM in left shoulder to [facility] for increased ability to comb her hair. Short Term Goal 3: Patient will increase strength in her left shoulder to 4-/5 for increased ability to pick up supplies when cooking. Short Term Goal 4: Patient will decrease pain in her left shoulder to 3/10 when cooking or taking photographs. Short Term Goal 5: Patient will decrease fascial restrictions in her left shoulder to moderate for increased comfort when sleeping. Long Term Goals Time to Complete Long Term Goals: 8 weeks Long Term Goal 1: Patient will return to prior level of I with all B/IADLs and leisure activities.  Long Term Goal 2: Patient will increase AROM in her left shoulder to [facility] for increased ability to reach into overhead cabinets. Long Term Goal 3: Patient will increase her left shoulder strength to 4+/5 for increased ability to lift bags of groceries. Long Term Goal 4: Patient will decrease pain in her left shoulder to 1/10 when completing functional activities. Long Term Goal 5: Patient will decrease fascial restrictions to minimal for increased comfort when sleeping on left side.   Problem List Patient Active Problem List  Diagnosis  . CARPAL TUNNEL SYNDROME  . HYPERTENSION  . BACK PAIN  . DIABETES MELLITUS, TYPE II  . Light-headedness  . Hearing loss in right ear  . Malignant paraganglioma  . GERD (gastroesophageal reflux disease)  . Hypothyroid  . Hyperlipidemia LDL goal < 130  . Pain in joint, shoulder region  . Muscle weakness (generalized)    End of Session Activity  Tolerance: Patient tolerated treatment well General Behavior During Session: [facility] for tasks  performed Cognition: [facility] for tasks performed  GO   Kennet Mccort L. Starling Jessie, COTA/L  12/22/2011, 11:58 AM

## 2011-12-24 ENCOUNTER — Ambulatory Visit (HOSPITAL_COMMUNITY)
Admission: RE | Admit: 2011-12-24 | Discharge: 2011-12-24 | Disposition: A | Discharge status: Home / Self Care | Payer: Medicare HMO | Source: Ambulatory Visit | Attending: Specialist | Admitting: Specialist

## 2011-12-24 DIAGNOSIS — M25519 Pain in unspecified shoulder: Secondary | ICD-10-CM

## 2011-12-24 DIAGNOSIS — M6281 Muscle weakness (generalized): Secondary | ICD-10-CM

## 2011-12-24 NOTE — Progress Notes (Signed)
Occupational Therapy Treatment Patient Details  Name: Christine Dominguez MRN: [id number] Date of Birth: 03-14-1951  Today's Date: 12/24/2011 Time: 4098-1191 OT Time Calculation (min): 41 min Manual Therapy: [id number] 21' Therapeutic Exercise: 1308-6578 20' Visit#: 6  of 24   Re-eval: 01/06/12 Assessment Diagnosis: Left Arm Pain  Authorization: [employer] requested 24 visits  Authorization Time Period: g code before 10th visit   Authorization Visit#: 6  of 10   Subjective Symptoms/Limitations Symptoms: S: I've been tight and used my heating pad last night.  Pain Assessment Currently in Pain?: Yes Pain Score:   3 Pain Location: Shoulder Pain Orientation: Left Pain Type: Acute pain  Precautions/Restrictions  Precautions Precautions: None;Other (comment) (shunt along L trap/cervical region, avoid during MFR)  Exercise/Treatments Supine Protraction: PROM;10 reps Horizontal ABduction: PROM;10 reps External Rotation: PROM;10 reps Internal Rotation: PROM;10 reps Flexion: PROM;10 reps ABduction: PROM;10 reps Other Supine Exercises: bridges x 20 Seated Elevation: AROM;15 reps Extension: AROM;15 reps Row: AROM;15 reps    Therapy Ball Flexion: 20 reps;Limitations Flexion Limitations: one cue initially to remind to keep shoulder depressed ABduction: 20 reps;Limitations ABduction Limitations: one cue initially to keep shoulder depressed ROM / Strengthening / Isometric Strengthening   Flexion: 5X5";Supine Flexion Limitations: flex/ ext very weak Extension: 5X5";Supine Extension Limitations: flex/ ext very weak External Rotation: 5X5";Supine Internal Rotation: 5X5";Supine ABduction: 5X5";Supine ADduction: 5X5";Supine        Manual Therapy Manual Therapy: Myofascial release Myofascial Release: MFR and manual stretching to left upper anterior and lateral arm, clavicle, and scapular region to decrease pain and restrictions and improve mobility. Avoiding  Manual over shunt area.[id number]  Occupational Therapy Assessment and Plan OT Assessment and Plan Clinical Impression Statement: A: Pt has soreness/pain with minimal PROM in ABD, HAB, and ER. Pt has significant weakness in FLX/ EX compared to other isometric exercises performed. Pt tolerated therapy ball  well with minimal cues  to keep shoulder  depressed and body in proper alignment.  Rehab Potential: Excellent OT Plan: P: Increase PROM to allow for AAROM. Increase range while completing therapy ball exercises with less cuing for positioning.    Goals Short Term Goals Time to Complete Short Term Goals: 4 weeks Short Term Goal 1: Patient will be educated on a HEP. Short Term Goal 2: Patient will increase PROM in left shoulder to [facility] for increased ability to comb her hair. Short Term Goal 3: Patient will increase strength in her left shoulder to 4-/5 for increased ability to pick up supplies when cooking. Short Term Goal 4: Patient will decrease pain in her left shoulder to 3/10 when cooking or taking photographs. Short Term Goal 5: Patient will decrease fascial restrictions in her left shoulder to moderate for increased comfort when sleeping. Long Term Goals Time to Complete Long Term Goals: 8 weeks Long Term Goal 1: Patient will return to prior level of I with all B/IADLs and leisure activities.  Long Term Goal 2: Patient will increase AROM in her left shoulder to [facility] for increased ability to reach into overhead cabinets. Long Term Goal 3: Patient will increase her left shoulder strength to 4+/5 for increased ability to lift bags of groceries. Long Term Goal 4: Patient will decrease pain in her left shoulder to 1/10 when completing functional activities. Long Term Goal 5: Patient will decrease fascial restrictions to minimal for increased comfort when sleeping on left side.   Problem List Patient Active Problem List  Diagnosis  . CARPAL TUNNEL SYNDROME  . HYPERTENSION  . BACK PAIN  .  DIABETES MELLITUS, TYPE II  . Light-headedness  . Hearing loss in right ear  . Malignant paraganglioma  . GERD (gastroesophageal reflux disease)  . Hypothyroid  . Hyperlipidemia LDL goal < 130  . Pain in joint, shoulder region  . Muscle weakness (generalized)    End of Session Activity Tolerance: Patient tolerated treatment well General Behavior During Session: [facility] for tasks performed Cognition: [facility] for tasks performed  GO    [name], Lekesha Claw L 12/24/2011, 12:34 PM

## 2011-12-27 ENCOUNTER — Inpatient Hospital Stay (HOSPITAL_COMMUNITY): Admission: RE | Admit: 2011-12-27 | Payer: Medicare HMO | Source: Ambulatory Visit | Admitting: Specialist

## 2011-12-29 ENCOUNTER — Other Ambulatory Visit: Payer: Self-pay | Admitting: Adult Health

## 2011-12-29 ENCOUNTER — Other Ambulatory Visit (HOSPITAL_COMMUNITY)
Admission: RE | Admit: 2011-12-29 | Discharge: 2011-12-29 | Disposition: A | Discharge status: Home / Self Care | Payer: Medicare HMO | Source: Ambulatory Visit | Attending: Obstetrics and Gynecology | Admitting: Obstetrics and Gynecology

## 2011-12-29 ENCOUNTER — Ambulatory Visit (HOSPITAL_COMMUNITY)
Admission: RE | Admit: 2011-12-29 | Discharge: 2011-12-29 | Disposition: A | Discharge status: Home / Self Care | Payer: Medicare HMO | Source: Ambulatory Visit | Attending: Occupational Therapy | Admitting: Occupational Therapy

## 2011-12-29 DIAGNOSIS — Z01419 Encounter for gynecological examination (general) (routine) without abnormal findings: Secondary | ICD-10-CM | POA: Insufficient documentation

## 2011-12-29 DIAGNOSIS — Z1151 Encounter for screening for human papillomavirus (HPV): Secondary | ICD-10-CM | POA: Insufficient documentation

## 2011-12-29 DIAGNOSIS — M6281 Muscle weakness (generalized): Secondary | ICD-10-CM

## 2011-12-29 DIAGNOSIS — M25519 Pain in unspecified shoulder: Secondary | ICD-10-CM

## 2011-12-29 NOTE — Progress Notes (Signed)
Occupational Therapy Treatment Patient Details  Name: Christine Dominguez MRN: [id number] Date of Birth: February 02, 1951  Today's Date: 12/29/2011 Time: 7829-5621 OT Time Calculation (min): 34 min  Visit#: 7  of 24   Re-eval: 01/06/12 Assessment Diagnosis: Left Arm Pain  Authorization: [employer] requested 24 visits   Authorization Time Period: g code before 10th visit   Authorization Visit#: 7  of 10   Subjective Symptoms/Limitations Symptoms: S:  I wasn't feeling well last time so I cancelled.  My arm is feeling better. Pain Assessment Currently in Pain?: Yes Pain Score:   4 Pain Location: Shoulder Pain Orientation: Left Pain Type: Acute pain  Precautions/Restrictions  Precautions Precautions: None;Other (comment)  Exercise/Treatments Supine Protraction: PROM;10 reps Horizontal ABduction: PROM;10 reps External Rotation: PROM;10 reps Internal Rotation: PROM;10 reps Flexion: PROM;10 reps ABduction: PROM;10 reps Other Supine Exercises: bridges x 20 Seated Elevation: AROM;15 reps Extension: AROM;15 reps Row: AROM;15 reps Therapy Ball Flexion: 20 reps;Limitations ABduction: 20 reps;Limitations ROM / Strengthening / Isometric Strengthening   Flexion: 5X5";Supine Extension: 5X5";Supine External Rotation: 5X5";Supine Internal Rotation: 5X5";Supine ABduction: 5X5";Supine ADduction: 5X5";Supine      Manual Therapy Manual Therapy: Myofascial release Myofascial Release: MFR and manual stretching to left upper anterior and lateral arm, clavicle, and scapular region to decrease pain and restrictions and improve mobility. Avoiding Manual over shunt area.  Occupational Therapy Assessment and Plan OT Assessment and Plan Clinical Impression Statement: A:  Patient remains very limited with all shoulder ranges and does not tolerate much PROM.  Isometrics flexion and ext remains weak. Rehab Potential: Excellent OT Plan: P:  Continue to increase PROM as patient  tolerates to progress to AAROM.   Goals Short Term Goals Time to Complete Short Term Goals: 4 weeks Short Term Goal 1: Patient will be educated on a HEP. Short Term Goal 2: Patient will increase PROM in left shoulder to [facility] for increased ability to comb her hair. Short Term Goal 3: Patient will increase strength in her left shoulder to 4-/5 for increased ability to pick up supplies when cooking. Short Term Goal 4: Patient will decrease pain in her left shoulder to 3/10 when cooking or taking photographs. Short Term Goal 5: Patient will decrease fascial restrictions in her left shoulder to moderate for increased comfort when sleeping. Long Term Goals Time to Complete Long Term Goals: 8 weeks Long Term Goal 1: Patient will return to prior level of I with all B/IADLs and leisure activities.  Long Term Goal 2: Patient will increase AROM in her left shoulder to [facility] for increased ability to reach into overhead cabinets. Long Term Goal 3: Patient will increase her left shoulder strength to 4+/5 for increased ability to lift bags of groceries. Long Term Goal 4: Patient will decrease pain in her left shoulder to 1/10 when completing functional activities. Long Term Goal 5: Patient will decrease fascial restrictions to minimal for increased comfort when sleeping on left side.   Problem List Patient Active Problem List  Diagnosis  . CARPAL TUNNEL SYNDROME  . HYPERTENSION  . BACK PAIN  . DIABETES MELLITUS, TYPE II  . Light-headedness  . Hearing loss in right ear  . Malignant paraganglioma  . GERD (gastroesophageal reflux disease)  . Hypothyroid  . Hyperlipidemia LDL goal < 130  . Pain in joint, shoulder region  . Muscle weakness (generalized)    End of Session Activity Tolerance: Patient tolerated treatment well General Behavior During Session: [facility] for tasks performed Cognition: [facility] for tasks performed  GO  Efren Kross L. Diante Barley, COTA/L  12/29/2011, 12:15 PM

## 2011-12-31 ENCOUNTER — Ambulatory Visit (HOSPITAL_COMMUNITY)
Admission: RE | Admit: 2011-12-31 | Discharge: 2011-12-31 | Disposition: A | Discharge status: Home / Self Care | Payer: Medicare HMO | Source: Ambulatory Visit | Attending: Family Medicine | Admitting: Family Medicine

## 2011-12-31 DIAGNOSIS — M25519 Pain in unspecified shoulder: Secondary | ICD-10-CM

## 2011-12-31 DIAGNOSIS — M6281 Muscle weakness (generalized): Secondary | ICD-10-CM

## 2011-12-31 NOTE — Progress Notes (Signed)
Occupational Therapy Treatment Patient Details  Name: Christine Dominguez MRN: [id number] Date of Birth: Oct 13, 1950  Today's Date: 12/31/2011 Time: 8841-6606 OT Time Calculation (min): 39 min Manual Therapy 3016-0109 22' Therapeutic Exercise 1120-1136 16'  Visit#: 8  of 24   Re-eval: 01/06/12 Assessment Diagnosis: Left Arm Pain  Authorization: [employer] requested 24 visits ;   Authorization Time Period:  code before 10th visit  Authorization Visit#: 8  of 10   Subjective Symptoms/Limitations Symptoms: S:  My back is hurting more but my arm feels better Pain Assessment Currently in Pain?: Yes Pain Score:   3 Pain Location: Shoulder Pain Orientation: Left  Precautions/Restrictions  Precautions Precautions: None;Other (comment)  Exercise/Treatments Supine Protraction: PROM;10 reps Horizontal ABduction: PROM;10 reps External Rotation: PROM;10 reps Internal Rotation: PROM;10 reps Flexion: PROM;10 reps ABduction: PROM;10 reps Other Supine Exercises: bridges x 20 Seated Elevation: AROM;15 reps Extension: AROM;15 reps Row: AROM;15 reps Pulleys Flexion: 1 minute ABduction: 1 minute Therapy Ball Flexion: 20 reps;Limitations ABduction: 20 reps;Limitations ROM / Strengthening / Isometric Strengthening   Flexion: 5X10" Extension: 5X10" External Rotation: 5X10" Internal Rotation: 5X10" ABduction: 5X10" ADduction: 5X10" Stretches Corner Stretch: Other (comment) (add next session) Cross Chest Stretch: Other (comment) (add next session)     Manual Therapy Manual Therapy: Myofascial release Myofascial Release: MFR and manual stretching to left upper anterior and lateral arm, clavicle, and scapular region to decrease pain and restrictions and improve mobility. Avoiding Manual over shunt area  Occupational Therapy Assessment and Plan OT Assessment and Plan Clinical Impression Statement: A:  Added pulleys to attempt to increase ROM. Increased hold with  isometrics.  Patient continues to be very limited with ROM. Rehab Potential: Excellent OT Plan: P:  Add corner stretch and posterior capsule stretch.  Reassess next week.   Goals Short Term Goals Time to Complete Short Term Goals: 4 weeks Short Term Goal 1: Patient will be educated on a HEP. Short Term Goal 1 Progress: Met Short Term Goal 2: Patient will increase PROM in left shoulder to [facility] for increased ability to comb her hair. Short Term Goal 2 Progress: Progressing toward goal Short Term Goal 3: Patient will increase strength in her left shoulder to 4-/5 for increased ability to pick up supplies when cooking. Short Term Goal 3 Progress: Progressing toward goal Short Term Goal 4: Patient will decrease pain in her left shoulder to 3/10 when cooking or taking photographs. Short Term Goal 4 Progress: Progressing toward goal Short Term Goal 5: Patient will decrease fascial restrictions in her left shoulder to moderate for increased comfort when sleeping. Short Term Goal 5 Progress: Progressing toward goal Long Term Goals Time to Complete Long Term Goals: 8 weeks Long Term Goal 1: Patient will return to prior level of I with all B/IADLs and leisure activities.  Long Term Goal 1 Progress: Progressing toward goal Long Term Goal 2: Patient will increase AROM in her left shoulder to [facility] for increased ability to reach into overhead cabinets. Long Term Goal 2 Progress: Progressing toward goal Long Term Goal 3: Patient will increase her left shoulder strength to 4+/5 for increased ability to lift bags of groceries. Long Term Goal 3 Progress: Progressing toward goal Long Term Goal 4: Patient will decrease pain in her left shoulder to 1/10 when completing functional activities. Long Term Goal 4 Progress: Progressing toward goal Long Term Goal 5: Patient will decrease fascial restrictions to minimal for increased comfort when sleeping on left side.  Long Term Goal 5 Progress: Progressing toward  goal  Problem List Patient Active Problem List  Diagnosis  . CARPAL TUNNEL SYNDROME  . HYPERTENSION  . BACK PAIN  . DIABETES MELLITUS, TYPE II  . Light-headedness  . Hearing loss in right ear  . Malignant paraganglioma  . GERD (gastroesophageal reflux disease)  . Hypothyroid  . Hyperlipidemia LDL goal < 130  . Pain in joint, shoulder region  . Muscle weakness (generalized)    End of Session Activity Tolerance: Patient tolerated treatment well General Behavior During Session: [facility] for tasks performed Cognition: [facility] for tasks performed  GO   Lari Linson L. Montae Stager, COTA/L  12/31/2011, 11:59 AM

## 2012-01-03 ENCOUNTER — Telehealth: Payer: Self-pay | Admitting: Family Medicine

## 2012-01-03 NOTE — Telephone Encounter (Signed)
pls let her know I go  by what is recommended by the therpist.so whatever they suggest I agree with. Pls ask and document how she feels following the treatments she has got, does she feel better, and would she want more

## 2012-01-04 ENCOUNTER — Telehealth: Payer: Self-pay | Admitting: Family Medicine

## 2012-01-05 NOTE — Telephone Encounter (Signed)
Patient aware.

## 2012-01-05 NOTE — Telephone Encounter (Signed)
Said she was referred for PT and she went for 2 weeks and when she went back they told her she wasn't on the schedule anymore. She said she it was supposed to be for 4-6 weeks. Can you check on this and see what happened?

## 2012-01-12 ENCOUNTER — Ambulatory Visit (HOSPITAL_COMMUNITY)
Admission: RE | Admit: 2012-01-12 | Discharge: 2012-01-12 | Disposition: A | Discharge status: Home / Self Care | Payer: Medicare HMO | Source: Ambulatory Visit | Attending: Family Medicine | Admitting: Family Medicine

## 2012-01-12 DIAGNOSIS — IMO0001 Reserved for inherently not codable concepts without codable children: Secondary | ICD-10-CM | POA: Insufficient documentation

## 2012-01-12 DIAGNOSIS — M25519 Pain in unspecified shoulder: Secondary | ICD-10-CM | POA: Insufficient documentation

## 2012-01-12 DIAGNOSIS — M25619 Stiffness of unspecified shoulder, not elsewhere classified: Secondary | ICD-10-CM | POA: Insufficient documentation

## 2012-01-12 DIAGNOSIS — I10 Essential (primary) hypertension: Secondary | ICD-10-CM | POA: Insufficient documentation

## 2012-01-12 DIAGNOSIS — M6281 Muscle weakness (generalized): Secondary | ICD-10-CM

## 2012-01-12 DIAGNOSIS — E119 Type 2 diabetes mellitus without complications: Secondary | ICD-10-CM | POA: Insufficient documentation

## 2012-01-12 NOTE — Progress Notes (Signed)
Occupational Therapy Treatment Patient Details  Name: Christine Dominguez MRN: [id number] Date of Birth: 11/08/1950  Today's Date: 01/12/2012 Time: 0865-7846 OT Time Calculation (min): 52 min Manual Therapy [phone] 15' Therapeutic Exercises [phone] and [phone] 30' Reassessment [phone] Visit#: 9  of 24   Re-eval: 01/24/12 (for Francine Graven)    Authorization: [employer] all visits through 10/21 are approved,   Authorization Time Period: G code before 19th visit  Authorization Visit#: 9  of 19   Subjective S:  I could really tell that I missed some therapy visits. Limitations: Progress to AROM when AAROM is 80%, avoid manual to left neck region secondary to shunt Pain Assessment Currently in Pain?: Yes Pain Score:   4 Pain Location: Shoulder Pain Orientation: Right Pain Type: Acute pain  Precautions/Restrictions   see limitations  Exercise/Treatments Supine Protraction: PROM;AAROM;10 reps Horizontal ABduction: PROM;AAROM;10 reps External Rotation: PROM;AAROM;10 reps;Limitations External Rotation Limitations: limitations are quite severe Internal Rotation: PROM;AAROM;10 reps Flexion: PROM;AAROM;10 reps ABduction: PROM;AAROM;10 reps;Limitations ABduction Limitations: limited to 60 degrees of abduction Other Supine Exercises: dc Seated Elevation: AROM;15 reps Extension: AROM;15 reps Row: AROM;15 reps Pulleys Flexion: 2 minutes ABduction: 2 minutes Therapy Ball Flexion: 25 reps Flexion Limitations: very minimal AROM when using proper positioning - required cuing and moderate facilitation for positioning initially but able to complete Ily ABduction: 25 reps ROM / Strengthening / Isometric Strengthening   Flexion:  (dc isometric strengthening) Stretches Research officer, political party: 5 reps;10 seconds Cross Chest Stretch: 5 reps;10 seconds    Manual Therapy Manual Therapy: Myofascial release Myofascial Release: MFR and manual stretching to left upper anterior and lateral arm,  clavicle, and scapular region to decrease pain and restrictions and improve mobility. [phone]  Occupational Therapy Assessment and Plan OT Assessment and Plan Clinical Impression Statement: A:  Please see monthly progress note.  DC isometrics and added dowel rod stretches to give patient ownership and promote I with self stretching of LUE.   OT Duration: 2 weeks;4 weeks OT Plan: P:  Continue skilled OT treatment 2 times a week x 4 weeks to increase left shoulder mobility needed for increased I with ADLs.     Goals Short Term Goals Time to Complete Short Term Goals: 4 weeks Short Term Goal 1: Patient will be educated on a HEP. Short Term Goal 1 Progress: Met Short Term Goal 2: Patient will increase PROM in left shoulder to [facility] for increased ability to comb her hair. Short Term Goal 2 Progress: Progressing toward goal Short Term Goal 3: Patient will increase strength in her left shoulder to 4-/5 for increased ability to pick up supplies when cooking. Short Term Goal 3 Progress: Progressing toward goal Short Term Goal 4: Patient will decrease pain in her left shoulder to 3/10 when cooking or taking photographs. Short Term Goal 4 Progress: Progressing toward goal Short Term Goal 5: Patient will decrease fascial restrictions in her left shoulder to moderate for increased comfort when sleeping. Short Term Goal 5 Progress: Progressing toward goal Long Term Goals Time to Complete Long Term Goals: 8 weeks Long Term Goal 1: Patient will return to prior level of I with all B/IADLs and leisure activities.  Long Term Goal 1 Progress: Progressing toward goal Long Term Goal 2: Patient will increase AROM in her left shoulder to [facility] for increased ability to reach into overhead cabinets. Long Term Goal 2 Progress: Progressing toward goal Long Term Goal 3: Patient will increase her left shoulder strength to 4+/5 for increased ability to lift bags of groceries. Long  Term Goal 3 Progress: Progressing toward  goal Long Term Goal 4: Patient will decrease pain in her left shoulder to 1/10 when completing functional activities. Long Term Goal 4 Progress: Progressing toward goal Long Term Goal 5: Patient will decrease fascial restrictions to minimal for increased comfort when sleeping on left side.  Long Term Goal 5 Progress: Progressing toward goal  Problem List Patient Active Problem List  Diagnosis  . CARPAL TUNNEL SYNDROME  . HYPERTENSION  . BACK PAIN  . DIABETES MELLITUS, TYPE II  . Light-headedness  . Hearing loss in right ear  . Malignant paraganglioma  . GERD (gastroesophageal reflux disease)  . Hypothyroid  . Hyperlipidemia LDL goal < 130  . Pain in joint, shoulder region  . Muscle weakness (generalized)    End of Session Activity Tolerance: Patient tolerated treatment well General Behavior During Session: [facility] for tasks performed Cognition: [facility] for tasks performed OT Plan of Care OT Home Exercise Plan: Educated on dowel rod stretches.  GO Functional Assessment Tool Used: UEFI was 31/80, currently 34/80= 42% I leve and 68% impairment level Functional Limitation: Carrying, moving and handling objects Carrying, Moving and Handling Objects Current Status (A2130): At least 60 percent but less than 80 percent impaired, limited or restricted Carrying, Moving and Handling Objects Goal Status [phone]): At least 1 percent but less than 20 percent impaired, limited or restricted  Shirlean Mylar, OTR/L  01/12/2012, 10:58 AM

## 2012-01-13 ENCOUNTER — Ambulatory Visit (HOSPITAL_COMMUNITY)
Admission: RE | Admit: 2012-01-13 | Discharge: 2012-01-13 | Disposition: A | Discharge status: Home / Self Care | Payer: Medicare HMO | Source: Ambulatory Visit | Attending: Family Medicine | Admitting: Family Medicine

## 2012-01-13 DIAGNOSIS — M6281 Muscle weakness (generalized): Secondary | ICD-10-CM

## 2012-01-13 DIAGNOSIS — M25519 Pain in unspecified shoulder: Secondary | ICD-10-CM

## 2012-01-13 NOTE — Progress Notes (Addendum)
Occupational Therapy Treatment Patient Details  Name: Christine Dominguez MRN: [id number] Date of Birth: 1950/09/22  Today's Date: 01/13/2012 Time: 7829-5621 OT Time Calculation (min): 38 min Manual Therapy 3086-5784 23' Therapeutic Exercise 6962-9528 14'  Visit#: 10  of 24   Re-eval: 01/24/12 Assessment Diagnosis: Left Arm Pain  Authorization: [employer] all visits through 10/21 are approved  Authorization Time Period: G code before 19th visit   Authorization Visit#: 10  of 19   Subjective Symptoms/Limitations Symptoms: S:  I am a little sore, last night I laid with a heating pad on it. Limitations: Progress to AROM when AAROM is 80%, avoid manual to left neck region secondary to shunt  Pain Assessment Currently in Pain?: Yes Pain Score:   3 Pain Orientation: Left Pain Type: Acute pain  Precautions/Restrictions  Precautions Precautions: None;Other (comment) ((Shunt along L trapezius/cervical region, avoid during MT))  Exercise/Treatments Supine Protraction: PROM;AAROM;12 reps Horizontal ABduction: PROM;AAROM;12 reps External Rotation: PROM;AAROM;Limitations;12 reps External Rotation Limitations: limited to 25 degrees of AAROM Internal Rotation: PROM;AAROM;12 reps Flexion: PROM;AAROM;12 reps ABduction: PROM;AAROM;Limitations;12 reps ABduction Limitations: reached 85 degrees today. Seated Elevation: AROM;15 reps Extension: AROM;15 reps Row: AROM;15 reps Pulleys Flexion: 2 minutes ABduction: 2 minutes Therapy Ball Flexion: 25 reps Flexion Limitations: tactile cues to keep shoulder depressed ABduction: 25 reps Stretches Corner Stretch:  (resume) Cross Chest Stretch:  (resume)     Manual Therapy Manual Therapy: Myofascial release Myofascial Release: MFR and manual stretching to left upper anterior and lateral arm, clavicle, and scapular region to decrease pain and restrictions and improve mobility  Occupational Therapy Assessment and Plan OT Assessment and  Plan Clinical Impression Statement: A:  Increase in ABD supine with AAROM to 85 degrees.  Missed shoulder stretches in error, resume next visit. Rehab Potential: Excellent OT Plan: P:  Attempt ball circles.   Goals Short Term Goals Time to Complete Short Term Goals: 4 weeks Short Term Goal 1: Patient will be educated on a HEP. Short Term Goal 2: Patient will increase PROM in left shoulder to [facility] for increased ability to comb her hair. Short Term Goal 3: Patient will increase strength in her left shoulder to 4-/5 for increased ability to pick up supplies when cooking. Short Term Goal 4: Patient will decrease pain in her left shoulder to 3/10 when cooking or taking photographs. Short Term Goal 5: Patient will decrease fascial restrictions in her left shoulder to moderate for increased comfort when sleeping. Long Term Goals Time to Complete Long Term Goals: 8 weeks Long Term Goal 1: Patient will return to prior level of I with all B/IADLs and leisure activities.  Long Term Goal 2: Patient will increase AROM in her left shoulder to [facility] for increased ability to reach into overhead cabinets. Long Term Goal 3: Patient will increase her left shoulder strength to 4+/5 for increased ability to lift bags of groceries. Long Term Goal 4: Patient will decrease pain in her left shoulder to 1/10 when completing functional activities. Long Term Goal 5: Patient will decrease fascial restrictions to minimal for increased comfort when sleeping on left side.   Problem List Patient Active Problem List  Diagnosis  . CARPAL TUNNEL SYNDROME  . HYPERTENSION  . BACK PAIN  . DIABETES MELLITUS, TYPE II  . Light-headedness  . Hearing loss in right ear  . Malignant paraganglioma  . GERD (gastroesophageal reflux disease)  . Hypothyroid  . Hyperlipidemia LDL goal < 130  . Pain in joint, shoulder region  . Muscle weakness (generalized)  End of Session Activity Tolerance: Patient tolerated treatment  well General Behavior During Session: [facility] for tasks performed Cognition: [facility] for tasks performed  GO   Salif Tay L. Nusayba Cadenas, COTA/L  01/13/2012, 11:13 AM

## 2012-01-17 ENCOUNTER — Ambulatory Visit (HOSPITAL_COMMUNITY)
Admission: RE | Admit: 2012-01-17 | Discharge: 2012-01-17 | Disposition: A | Discharge status: Home / Self Care | Payer: Medicare HMO | Source: Ambulatory Visit | Attending: Family Medicine | Admitting: Family Medicine

## 2012-01-17 DIAGNOSIS — M6281 Muscle weakness (generalized): Secondary | ICD-10-CM

## 2012-01-17 DIAGNOSIS — M25519 Pain in unspecified shoulder: Secondary | ICD-10-CM

## 2012-01-17 NOTE — Progress Notes (Signed)
Occupational Therapy Treatment Patient Details  Name: Christine Dominguez MRN: [id number] Date of Birth: Jan 28, 1951  Today's Date: 01/17/2012 Time: 1020-1108 OT Time Calculation (min): 48 min Manual Therapy 1020-1040 20' Therapeutic Exercises 1040-1108 28' Visit#: 11  of 24   Re-eval: 01/24/12    Authorization: [employer] all visits through 10/21 are approved   Authorization Time Period: G code before 19th visit   Authorization Visit#: 11  of 24   Subjective S:  I made potato salad for 100 people this weekend Pain Assessment Currently in Pain?: Yes Pain Score:   3 Pain Location: Shoulder Pain Orientation: Left Pain Type: Acute pain  Precautions/Restrictions   progress as tolerated  Exercise/Treatments Supine Protraction: PROM;10 reps;AAROM;15 reps Horizontal ABduction: PROM;10 reps;AAROM;15 reps External Rotation: PROM;10 reps;AAROM;15 reps External Rotation Limitations: limited to 25 degrees of AAROM Internal Rotation: PROM;10 reps;AAROM;15 reps Flexion: PROM;10 reps;AAROM;15 reps ABduction: PROM;10 reps;AAROM;15 reps ABduction Limitations: reached 90 degrees today Seated Elevation: AROM;15 reps Extension: AROM;15 reps Row: AROM;15 reps Pulleys Flexion: 3 minutes ABduction: 3 minutes Therapy Ball Flexion: 25 reps Flexion Limitations: completed without vg and cuing this date ABduction: 25 reps Right/Left: 5 reps ROM / Strengthening / Isometric Strengthening Wall Wash: on inclined table 1'   Teacher, music:  (HEP) Cross Chest Stretch:  (HEP)    Manual Therapy Manual Therapy: Myofascial release Myofascial Release: MFR and manual stretching to left upper anterior and lateral arm, clavicle, and scapular region to decrease pain and restrictions and improve mobility 1020-1040  Occupational Therapy Assessment and Plan OT Assessment and Plan Clinical Impression Statement: A:  Able to complete ball circles this date. OT Plan: P:  Increase to 3' with  incline wash.  Increase ability to move shoulder through all ranges by 5 degrees.   Goals Short Term Goals Time to Complete Short Term Goals: 4 weeks Short Term Goal 1: Patient will be educated on a HEP. Short Term Goal 2: Patient will increase PROM in left shoulder to [facility] for increased ability to comb her hair. Short Term Goal 2 Progress: Progressing toward goal Short Term Goal 3: Patient will increase strength in her left shoulder to 4-/5 for increased ability to pick up supplies when cooking. Short Term Goal 3 Progress: Progressing toward goal Short Term Goal 4: Patient will decrease pain in her left shoulder to 3/10 when cooking or taking photographs. Short Term Goal 4 Progress: Progressing toward goal Short Term Goal 5: Patient will decrease fascial restrictions in her left shoulder to moderate for increased comfort when sleeping. Short Term Goal 5 Progress: Progressing toward goal Long Term Goals Time to Complete Long Term Goals: 8 weeks Long Term Goal 1: Patient will return to prior level of I with all B/IADLs and leisure activities.  Long Term Goal 1 Progress: Progressing toward goal Long Term Goal 2: Patient will increase AROM in her left shoulder to [facility] for increased ability to reach into overhead cabinets. Long Term Goal 2 Progress: Progressing toward goal Long Term Goal 3: Patient will increase her left shoulder strength to 4+/5 for increased ability to lift bags of groceries. Long Term Goal 3 Progress: Progressing toward goal Long Term Goal 4: Patient will decrease pain in her left shoulder to 1/10 when completing functional activities. Long Term Goal 4 Progress: Progressing toward goal Long Term Goal 5: Patient will decrease fascial restrictions to minimal for increased comfort when sleeping on left side.  Long Term Goal 5 Progress: Progressing toward goal  Problem List Patient Active Problem List  Diagnosis  . CARPAL TUNNEL SYNDROME  . HYPERTENSION  . BACK PAIN  .  DIABETES MELLITUS, TYPE II  . Light-headedness  . Hearing loss in right ear  . Malignant paraganglioma  . GERD (gastroesophageal reflux disease)  . Hypothyroid  . Hyperlipidemia LDL goal < 130  . Pain in joint, shoulder region  . Muscle weakness (generalized)    End of Session Activity Tolerance: Patient tolerated treatment well General Behavior During Session: [facility] for tasks performed Cognition: [facility] for tasks performed  GO    Shirlean Mylar, OTR/L  01/17/2012, 12:19 PM

## 2012-01-20 ENCOUNTER — Ambulatory Visit (HOSPITAL_COMMUNITY)
Admission: RE | Admit: 2012-01-20 | Discharge: 2012-01-20 | Disposition: A | Discharge status: Home / Self Care | Payer: Medicare HMO | Source: Ambulatory Visit | Attending: Family Medicine | Admitting: Family Medicine

## 2012-01-20 DIAGNOSIS — M6281 Muscle weakness (generalized): Secondary | ICD-10-CM

## 2012-01-20 DIAGNOSIS — M25519 Pain in unspecified shoulder: Secondary | ICD-10-CM

## 2012-01-20 NOTE — Progress Notes (Signed)
Occupational Therapy Treatment Patient Details  Name: Christine Dominguez MRN: [id number] Date of Birth: [birth year]  Today's Date: 01/20/2012 Time: 4098-1191 OT Time Calculation (min): 63 min Manual Therapy 853-916 23' Therapeutic Exercise 916-940 24'  Moist Heat 941-956 15'  Visit#: 12  of 24   Re-eval: 01/24/12 Assessment Diagnosis: Left Arm Pain  Authorization: [employer] all visits through 10/21 are approved   Authorization Time Period: G code before 19th visit   Authorization Visit#: 12  of 24   Subjective Symptoms/Limitations Symptoms: S:  I had to make more potato salad yesterday. Pain Assessment Currently in Pain?: Yes Pain Score:   3 Pain Location: Shoulder Pain Orientation: Left  Precautions/Restrictions  Precautions Precautions: None;Other (comment) Precaution Comments: (Shunt along L trapezius/cervical region, avoid during MT)  Exercise/Treatments Supine Protraction: PROM;10 reps;AAROM;15 reps Horizontal ABduction: PROM;10 reps;AAROM;15 reps External Rotation: PROM;10 reps;AAROM;15 reps Internal Rotation: PROM;10 reps;AAROM;15 reps Flexion: PROM;10 reps;AAROM;15 reps ABduction: PROM;10 reps;AAROM;15 reps Seated Elevation: AROM;15 reps Extension: AROM;15 reps Row: AROM;15 reps Pulleys Flexion: 3 minutes ABduction: 3 minutes Therapy Ball Flexion: 25 reps Flexion Limitations: vc to stretch as far as able ABduction: 25 reps ABduction Limitations: vc to stretch as far as able Right/Left: 5 reps ROM / Strengthening / Isometric Strengthening Wall Wash: on incline wedge x 3' flexion and ADB        Modalities Modalities: Moist Heat Manual Therapy Manual Therapy: Myofascial release Myofascial Release: MFR and manual stretching to left upper anterior and lateral arm, clavicle, and scapular region to decrease pain and restrictions and improve mobility  Moist Heat Therapy Number Minutes Moist Heat: 15 Minutes Moist Heat Location:  Shoulder  Occupational Therapy Assessment and Plan OT Assessment and Plan Clinical Impression Statement: A:  Added ADB with wedge wash.  Patient remains very limited with P/AAROM at about 50%. OT Plan: P: Attempt contract relax to get 5 more degrees of PROM.  REASSESS for [employer]   Goals Short Term Goals Time to Complete Short Term Goals: 4 weeks Short Term Goal 1: Patient will be educated on a HEP. Short Term Goal 2: Patient will increase PROM in left shoulder to [facility] for increased ability to comb her hair. Short Term Goal 3: Patient will increase strength in her left shoulder to 4-/5 for increased ability to pick up supplies when cooking. Short Term Goal 4: Patient will decrease pain in her left shoulder to 3/10 when cooking or taking photographs. Short Term Goal 5: Patient will decrease fascial restrictions in her left shoulder to moderate for increased comfort when sleeping. Long Term Goals Time to Complete Long Term Goals: 8 weeks Long Term Goal 1: Patient will return to prior level of I with all B/IADLs and leisure activities.  Long Term Goal 2: Patient will increase AROM in her left shoulder to [facility] for increased ability to reach into overhead cabinets. Long Term Goal 3: Patient will increase her left shoulder strength to 4+/5 for increased ability to lift bags of groceries. Long Term Goal 4: Patient will decrease pain in her left shoulder to 1/10 when completing functional activities. Long Term Goal 5: Patient will decrease fascial restrictions to minimal for increased comfort when sleeping on left side.   Problem List Patient Active Problem List  Diagnosis  . CARPAL TUNNEL SYNDROME  . HYPERTENSION  . BACK PAIN  . DIABETES MELLITUS, TYPE II  . Light-headedness  . Hearing loss in right ear  . Malignant paraganglioma  . GERD (gastroesophageal reflux disease)  . Hypothyroid  . Hyperlipidemia LDL  goal < 130  . Pain in joint, shoulder region  . Muscle weakness (generalized)     End of Session Activity Tolerance: Patient tolerated treatment well General Behavior During Session: [facility] for tasks performed Cognition: [facility] for tasks performed  GO   [name] L. Sahan Pen, COTA/L  01/20/2012, 10:06 AM

## 2012-01-24 ENCOUNTER — Ambulatory Visit (HOSPITAL_COMMUNITY)
Admission: RE | Admit: 2012-01-24 | Discharge: 2012-01-24 | Disposition: A | Discharge status: Home / Self Care | Payer: Medicare HMO | Source: Ambulatory Visit | Attending: Family Medicine | Admitting: Family Medicine

## 2012-01-24 DIAGNOSIS — M6281 Muscle weakness (generalized): Secondary | ICD-10-CM

## 2012-01-24 DIAGNOSIS — M25519 Pain in unspecified shoulder: Secondary | ICD-10-CM

## 2012-01-24 NOTE — Progress Notes (Signed)
Occupational Therapy Treatment Patient Details  Name: Christine Dominguez MRN: [id number] Date of Birth: [birth year]  Today's Date: 01/24/2012 Time: 4098-1191 OT Time Calculation (min): 48 min Manual Therapy 103-129 26' Therapeutic Exercise 130-151 21'  Visit#: 13  of 24   Re-eval: 02/14/12    Authorization: requested 12 more visits  Authorization Time Period: G code before 19th visit   Authorization Visit#: 13  of 24   Subjective Symptoms/Limitations Symptoms: S:  I had a really good weekend. Pain Assessment Currently in Pain?: Yes Pain Score:   3 Pain Location: Shoulder Pain Orientation: Left Pain Type: Acute pain  Precautions/Restrictions  Precautions Precaution Comments: (Shunt along L trapezius/cervical region, avoid during MT)  Exercise/Treatments   01/24/12 1300  Shoulder Exercises: Supine  Protraction PROM;10 reps;AAROM;15 reps  Horizontal ABduction PROM;10 reps;AAROM;15 reps  External Rotation PROM;10 reps;AAROM;15 reps  Internal Rotation PROM;10 reps;AAROM;15 reps  Flexion PROM;10 reps;AAROM;15 reps  ABduction PROM;10 reps;AAROM;15 reps  Shoulder Exercises: Seated  Elevation AROM;15 reps  Extension AROM;15 reps  Row AROM;15 reps  Shoulder Exercises: Pulleys  Flexion 3 minutes  ABduction 3 minutes  Shoulder Exercises: Therapy Ball  Flexion 25 reps  ABduction 25 reps  Right/Left 5 reps  Shoulder Exercises: ROM/Strengthening  Wall Wash 2'  Thumb Tacks add next session  Prot/Ret//Elev/Dep add next session      Manual Therapy Manual Therapy: Myofascial release Myofascial Release: MFR and manual stretching to left upper anterior and lateral arm, clavicle, and scapular region to decrease pain and restrictions and improve mobility   Occupational Therapy Assessment and Plan OT Assessment and Plan Clinical Impression Statement: A:  Changed wedge wash to wall wash.  wedge wash too easy and not providing an increase in ROM OT Plan: P:  Increase time  with wall wash.   Goals Short Term Goals Time to Complete Short Term Goals: 4 weeks Short Term Goal 1: Patient will be educated on a HEP. Short Term Goal 2: Patient will increase PROM in left shoulder to [facility] for increased ability to comb her hair. Short Term Goal 3: Patient will increase strength in her left shoulder to 4-/5 for increased ability to pick up supplies when cooking. Short Term Goal 4: Patient will decrease pain in her left shoulder to 3/10 when cooking or taking photographs. Short Term Goal 5: Patient will decrease fascial restrictions in her left shoulder to moderate for increased comfort when sleeping. Long Term Goals Time to Complete Long Term Goals: 8 weeks Long Term Goal 1: Patient will return to prior level of I with all B/IADLs and leisure activities.  Long Term Goal 2: Patient will increase AROM in her left shoulder to [facility] for increased ability to reach into overhead cabinets. Long Term Goal 3: Patient will increase her left shoulder strength to 4+/5 for increased ability to lift bags of groceries. Long Term Goal 4: Patient will decrease pain in her left shoulder to 1/10 when completing functional activities. Long Term Goal 5: Patient will decrease fascial restrictions to minimal for increased comfort when sleeping on left side.   Problem List Patient Active Problem List  Diagnosis  . CARPAL TUNNEL SYNDROME  . HYPERTENSION  . BACK PAIN  . DIABETES MELLITUS, TYPE II  . Light-headedness  . Hearing loss in right ear  . Malignant paraganglioma  . GERD (gastroesophageal reflux disease)  . Hypothyroid  . Hyperlipidemia LDL goal < 130  . Pain in joint, shoulder region  . Muscle weakness (generalized)    End of Session Activity  Tolerance: Patient tolerated treatment well General Behavior During Session: [facility] for tasks performed Cognition: [facility] for tasks performed  GO    [name], [name] 01/24/2012, 5:39 PM

## 2012-01-26 ENCOUNTER — Ambulatory Visit: Payer: Medicare HMO | Admitting: Cardiology

## 2012-01-27 ENCOUNTER — Ambulatory Visit (HOSPITAL_COMMUNITY)
Admission: RE | Admit: 2012-01-27 | Discharge: 2012-01-27 | Disposition: A | Discharge status: Home / Self Care | Payer: Medicare HMO | Source: Ambulatory Visit | Attending: Family Medicine | Admitting: Family Medicine

## 2012-01-27 DIAGNOSIS — M6281 Muscle weakness (generalized): Secondary | ICD-10-CM

## 2012-01-27 DIAGNOSIS — M25519 Pain in unspecified shoulder: Secondary | ICD-10-CM

## 2012-01-27 NOTE — Progress Notes (Signed)
Occupational Therapy Treatment Patient Details  Name: Christine Dominguez MRN: [id number] Date of Birth: 15-Apr-1950  Today's Date: 01/27/2012 Time: 1308-6578 OT Time Calculation (min): 50 min Manual therapy 4696-2952 25' Therapeutic Exercise 1042-1106 24'  Visit#: 14  of 24   Re-eval: 02/14/12    Authorization: Berdie Ogren authorized visits thru 03/10/12  Authorization Time Period: G code before 19th visit   Authorization Visit#: 14  of 24   Subjective Symptoms/Limitations Symptoms: S: My arm is a little sore but I've been using it.  I hve a muscle spasm every now and then. Pain Assessment Currently in Pain?: No/denies  Precautions/Restrictions     Exercise/Treatments Supine Protraction: PROM;10 reps;AAROM;15 reps Horizontal ABduction: PROM;10 reps;AAROM;15 reps External Rotation: PROM;10 reps;AAROM;15 reps Internal Rotation: PROM;10 reps;AAROM;15 reps Flexion: PROM;10 reps;AAROM;15 reps ABduction: PROM;10 reps;AAROM;15 reps Seated Elevation: AROM;15 reps Extension: AROM;15 reps Row: AROM;15 reps Pulleys Flexion: 3 minutes ABduction: 3 minutes Therapy Ball Flexion: 25 reps ABduction: 25 reps Right/Left: 5 reps ROM / Strengthening / Isometric Strengthening Wall Wash: time, add next visit Thumb Tacks: time, add next visit. Prot/Ret//Elev/Dep: time, add next visit      Manual Therapy Manual Therapy: Myofascial release Myofascial Release: MFR and manual stretching to left upper anterior and lateral arm, clavicle, and scapular region to decrease pain and restrictions and improve mobility avoiding area over shunt.  Occupational Therapy Assessment and Plan OT Assessment and Plan Clinical Impression Statement: A;  Patient continues to be limited with PROM/AAROM at about 60% of flexion and ABD and 25% of ER.  Some exercises not completed secondary to time. OT Plan: P:  Resume missed exercises and increase time on wall wash.   Goals Short Term Goals Time to  Complete Short Term Goals: 4 weeks Short Term Goal 1: Patient will be educated on a HEP. Short Term Goal 2: Patient will increase PROM in left shoulder to [facility] for increased ability to comb her hair. Short Term Goal 3: Patient will increase strength in her left shoulder to 4-/5 for increased ability to pick up supplies when cooking. Short Term Goal 4: Patient will decrease pain in her left shoulder to 3/10 when cooking or taking photographs. Short Term Goal 5: Patient will decrease fascial restrictions in her left shoulder to moderate for increased comfort when sleeping. Long Term Goals Time to Complete Long Term Goals: 8 weeks Long Term Goal 1: Patient will return to prior level of I with all B/IADLs and leisure activities.  Long Term Goal 2: Patient will increase AROM in her left shoulder to [facility] for increased ability to reach into overhead cabinets. Long Term Goal 3: Patient will increase her left shoulder strength to 4+/5 for increased ability to lift bags of groceries. Long Term Goal 4: Patient will decrease pain in her left shoulder to 1/10 when completing functional activities. Long Term Goal 5: Patient will decrease fascial restrictions to minimal for increased comfort when sleeping on left side.   Problem List Patient Active Problem List  Diagnosis  . CARPAL TUNNEL SYNDROME  . HYPERTENSION  . BACK PAIN  . DIABETES MELLITUS, TYPE II  . Light-headedness  . Hearing loss in right ear  . Malignant paraganglioma  . GERD (gastroesophageal reflux disease)  . Hypothyroid  . Hyperlipidemia LDL goal < 130  . Pain in joint, shoulder region  . Muscle weakness (generalized)    End of Session Activity Tolerance: Patient tolerated treatment well General Behavior During Session: [facility] for tasks performed Cognition: [facility] for tasks performed  GO  [name], Mikaia Janvier L 01/27/2012, 11:37 AM

## 2012-01-28 NOTE — Telephone Encounter (Signed)
Pt is doing physical therapy

## 2012-01-31 ENCOUNTER — Ambulatory Visit (HOSPITAL_COMMUNITY)
Admission: RE | Admit: 2012-01-31 | Discharge: 2012-01-31 | Disposition: A | Discharge status: Home / Self Care | Payer: Medicare HMO | Source: Ambulatory Visit | Attending: Family Medicine | Admitting: Family Medicine

## 2012-01-31 DIAGNOSIS — M25519 Pain in unspecified shoulder: Secondary | ICD-10-CM

## 2012-01-31 DIAGNOSIS — M6281 Muscle weakness (generalized): Secondary | ICD-10-CM

## 2012-01-31 NOTE — Progress Notes (Signed)
Occupational Therapy Treatment Patient Details  Name: Christine Dominguez MRN: [id number] Date of Birth: 08/31/1950  Today's Date: 01/31/2012 Time: 6213-0865 OT Time Calculation (min): 42 min Massage 7846-9629 13' Therex 5284-1324 29' Visit#: 15  of 24   Re-eval: 02/14/12    Authorization: Berdie Ogren authorized visits thru 03/10/12   Authorization Time Period: G code before 19th visit  Authorization Visit#: 15  of 24   Subjective Symptoms/Limitations Symptoms: S: my arm doesn't hurt right now, but I'm sure it will when we're done. Pain Assessment Currently in Pain?: No/denies  Precautions/Restrictions   Shunt on left side of neck.  Exercise/Treatments Supine Protraction: PROM;10 reps;AAROM;15 reps Horizontal ABduction: PROM;10 reps;AAROM;15 reps External Rotation: PROM;10 reps;AAROM;15 reps External Rotation Limitations:  (limited AAROM and PROM in external rotation) Internal Rotation: PROM;10 reps;AAROM;15 reps Flexion: PROM;10 reps;AAROM;15 reps Flexion Limitations: limited AAROM and PROM shoulder flexion ABduction: PROM;10 reps;AAROM;15 reps Seated Elevation: AROM;15 reps Extension: AROM;15 reps Retraction: AROM;15 reps Row: AROM;15 reps Horizontal ABduction: AROM;15 reps Horizontal ABduction Limitations: limited AROM  Flexion: AROM;15 reps Flexion Limitations: limited AROM shoulder flexion Therapy Ball Flexion: 25 reps Flexion Limitations: tactile cues to not hike shoulder ABduction: 15 reps Right/Left: 5 reps     Manual Therapy Manual Therapy: Massage Massage: massage and manual stretching to left upper anterior and lateral arm, clavicle, and scapular region to decreaserestrictions and improve mobility avoiding area over shunt.  Occupational Therapy Assessment and Plan OT Assessment and Plan Clinical Impression Statement: A: Patient continues to to limited with PROM/AAROM during shoulder flexion, shoulder abduction, and external rotation. Some  exercises were not completed again due to time. Pt needs increased time for majority of exercises. OT Plan: P: Cont. to work on performing all exercises and increase time on wall wash if possible.   Goals Short Term Goals Time to Complete Short Term Goals: 4 weeks Short Term Goal 1: Patient will be educated on a HEP. Short Term Goal 2: Patient will increase PROM in left shoulder to [facility] for increased ability to comb her hair. Short Term Goal 2 Progress: Progressing toward goal Short Term Goal 3: Patient will increase strength in her left shoulder to 4-/5 for increased ability to pick up supplies when cooking. Short Term Goal 3 Progress: Progressing toward goal Short Term Goal 4: Patient will decrease pain in her left shoulder to 3/10 when cooking or taking photographs. Short Term Goal 4 Progress: Progressing toward goal Short Term Goal 5: Patient will decrease fascial restrictions in her left shoulder to moderate for increased comfort when sleeping. Short Term Goal 5 Progress: Progressing toward goal Long Term Goals Time to Complete Long Term Goals: 8 weeks Long Term Goal 1: Patient will return to prior level of I with all B/IADLs and leisure activities.  Long Term Goal 1 Progress: Progressing toward goal Long Term Goal 2: Patient will increase AROM in her left shoulder to [facility] for increased ability to reach into overhead cabinets. Long Term Goal 2 Progress: Progressing toward goal Long Term Goal 3: Patient will increase her left shoulder strength to 4+/5 for increased ability to lift bags of groceries. Long Term Goal 3 Progress: Progressing toward goal Long Term Goal 4: Patient will decrease pain in her left shoulder to 1/10 when completing functional activities. Long Term Goal 4 Progress: Progressing toward goal Long Term Goal 5: Patient will decrease fascial restrictions to minimal for increased comfort when sleeping on left side.  Long Term Goal 5 Progress: Progressing toward  goal  Problem List Patient Active  Problem List  Diagnosis  . CARPAL TUNNEL SYNDROME  . HYPERTENSION  . BACK PAIN  . DIABETES MELLITUS, TYPE II  . Light-headedness  . Hearing loss in right ear  . Malignant paraganglioma  . GERD (gastroesophageal reflux disease)  . Hypothyroid  . Hyperlipidemia LDL goal < 130  . Pain in joint, shoulder region  . Muscle weakness (generalized)    End of Session Activity Tolerance: Patient tolerated treatment well General Behavior During Session: [facility] for tasks performed Cognition: [facility] for tasks performed   Limmie Patricia, OTR/L 01/31/2012, 2:08 PM

## 2012-02-03 ENCOUNTER — Ambulatory Visit (INDEPENDENT_AMBULATORY_CARE_PROVIDER_SITE_OTHER): Payer: Medicare HMO | Admitting: Physician Assistant

## 2012-02-03 ENCOUNTER — Encounter: Payer: Self-pay | Admitting: Physician Assistant

## 2012-02-03 ENCOUNTER — Ambulatory Visit (HOSPITAL_COMMUNITY)
Admission: RE | Admit: 2012-02-03 | Discharge: 2012-02-03 | Disposition: A | Discharge status: Home / Self Care | Payer: Medicare HMO | Source: Ambulatory Visit | Attending: Family Medicine | Admitting: Family Medicine

## 2012-02-03 VITALS — BP 132/80 | HR 90 | Ht 61.5 in | Wt 143.1 lb

## 2012-02-03 DIAGNOSIS — M6281 Muscle weakness (generalized): Secondary | ICD-10-CM

## 2012-02-03 DIAGNOSIS — M25519 Pain in unspecified shoulder: Secondary | ICD-10-CM

## 2012-02-03 DIAGNOSIS — I10 Essential (primary) hypertension: Secondary | ICD-10-CM

## 2012-02-03 NOTE — Progress Notes (Signed)
HPI:  This is a very pleasant 61 year old African American female patient of Dr. Dietrich Pates who is followed here for difficult to control hypertension.Over the past year she had radiation and surgery on her right ear and developed a loss of taste and loss of appetite resulting in a 45 pound weight loss. She says during this time she salted everything just to make it taste better. She has since gained 20 pounds back and is regaining her sense of taste and using less salt. Her blood pressures at home are running between 120s to 130/50-60. She feels great and denies any cardiac complaints. Her main problem now is pain in her left arm for which she is seeing physical therapy.  She denies chest pain, palpitations, dyspnea, dyspnea on exertion, or presyncope.  Allergies:  -- Amaryl   -- Aspirin    --  Burning in stomach, nausea  -- Avandia (Rosiglitazone Maleate)   -- Glimepiride   -- Glipizide   Current Outpatient Prescriptions on File Prior to Visit: ACCU-CHEK AVIVA PLUS test strip, USE AS DIRECTED TO CHECK BLOOD SUGAR THREE TIMES DAILY AS DIRECTED., Disp: 300 each, Rfl: 3 Alcohol Swabs (B-D SINGLE USE SWABS REGULAR) PADS, Use as needed daily with insulin injections, Disp: 300 each, Rfl: 1 aspirin EC 81 MG tablet, Take 81 mg by mouth at bedtime., Disp: , Rfl:  cloNIDine (CATAPRES) 0.1 MG tablet, TAKE 1 TABLET TWICE DAILY, Disp: 180 tablet, Rfl: PRN clotrimazole-betamethasone (LOTRISONE) cream, Apply 1 application topically 2 (two) times daily. Apply twice a day fir 10 days to rash , then as needed uses under breast, Disp: 45 g, Rfl: 2 cyclobenzaprine (FLEXERIL) 10 MG tablet, Take 10 mg by mouth at bedtime as needed., Disp: , Rfl:  docusate sodium (COLACE) 100 MG capsule, Take 100 mg by mouth every other day. , Disp: , Rfl:  gabapentin (NEURONTIN) 100 MG capsule, TAKE 1 CAPSULE TWICE DAILY, Disp: 180 capsule, Rfl: PRN glucose-Vitamin C (DEX4 GLUCOSE) 4-0.006 GM CHEW, Chew 1 tablet by mouth as  directed., Disp: , Rfl:  hydrochlorothiazide (MICROZIDE) 12.5 MG capsule, Take 1 capsule (12.5 mg total) by mouth as needed., Disp: 90 capsule, Rfl: 1 insulin glargine (LANTUS SOLOSTAR) 100 UNIT/ML injection, Inject 20 Units into the skin at bedtime.  , Disp: , Rfl:  Iron 66 MG TABS, Take 1 tablet by mouth 2 (two) times daily. , Disp: , Rfl:   linagliptin (TRADJENTA) 5 MG TABS tablet, Take 5 mg by mouth daily., Disp: , Rfl:  nystatin (MYCOSTATIN/NYSTOP) 100000 UNIT/GM POWD, Apply twice daily to affected area fr 2 weeks, then as needed for rashApply topically. Apply twice daily to affected area fr 2 weeks, then as needed for rash, Disp: 30 g, Rfl: 2 omeprazole (PRILOSEC) 20 MG capsule, Take 1 capsule (20 mg total) by mouth 2 (two) times daily., Disp: 180 capsule, Rfl: 3 simvastatin (ZOCOR) 20 MG tablet, Take 20 mg by mouth at bedtime.  , Disp: , Rfl:  valsartan (DIOVAN) 320 MG tablet, Take 1 tablet (320 mg total) by mouth daily., Disp: 90 tablet, Rfl: 1    Past Medical History:   Chronic back pain                               2007           Comment:disabled    Diabetes mellitus  1991         Hypertension                                    1980         Obesity                                                      Hydrocephalus                                                  Comment:treated with VP shunt and then intracranial               tumor resection (?Cholesteatoma); no longer               followed actively by neurosurgery or               otolaryngology   Cancer                                          1998, rec*     Comment:paraganglioma of jugular tympamicum, recurrent               in 2012  Past Surgical History:   VENTRICULOPERITONEAL SHUNT                      1998           Comment:at [facility]   Intracranial mass resected                      1998           Comment:at [facility]; ? location-right middle ear   Hemorrhoidectomy                                              TRIGGER FINGER RELEASE                                         Comment:right thumb   TUBAL LIGATION                                  1980         COLONOSCOPY                                     06/2010      Review of patient's family history indicates:   Cancer                         Mother  Comment: lung    Hypertension                   Mother                   Kidney failure                 Brother                  Diabetes                       Sister                   Diabetes                       Brother                  Hypertension                   Brother                  Social History   Marital Status: Married             Spouse Name:                      Years of Education:                 Number of children: 3           Occupational History Occupation          Associate Professor            Comment              disabled                                  Social History Main Topics   Smoking Status: Former Smoker                   Packs/Day: .5    Years: 15        Types: Cigarettes     Quit date: 07/07/2001   Smokeless Status: Never Used                       Alcohol Use: No             Drug Use: No             Sexual Activity: Not on file        Other Topics            Concern   None on file  Social History Narrative   Mother of 3 - all children are deceased      ROS:see history of present illness   PHYSICAL EXAM: Well-nournished, in no acute distress. Neck: No JVD, HJR, Bruit, or thyroid enlargement  Lungs:Decreased breath sounds throughout but No tachypnea, clear without wheezing, rales, or rhonchi  Cardiovascular: RRR, PMI not displaced, positive S4 and 1-2/6 systolic ejection murmur at the left sternal border, no bruit, thrill, or heave.  Abdomen: BS normal. Soft without organomegaly, masses, lesions or tenderness.  Extremities: without cyanosis, clubbing or edema. Good distal pulses bilateral  SKin: Warm, no lesions or  rashes   Musculoskeletal: No deformities  Neuro: no focal signs  BP 132/80  Pulse 90  Ht 5' 1.5" (1.562 m)  Wt 143 lb 1.9 oz (64.919 kg)  BMI 26.60 kg/m2  SpO2 99%   ZOX:WRUEAV sinus rhythm with nonspecific ST-T wave changes

## 2012-02-03 NOTE — Progress Notes (Signed)
Occupational Therapy Treatment Patient Details  Name: Christine Dominguez MRN: [id number] Date of Birth: 01-23-1951  Today's Date: 02/03/2012 Time: 8295-6213 OT Time Calculation (min): 46 min Manual Therapy 0865-7846 10' Therapeutic Exercises 1325-1351 26' Visit#: 16  of 24   Re-eval: 02/14/12    Authorization: Berdie Ogren authorized visits thru 03/10/12   Authorization Time Period: G code before 19th visit   Authorization Visit#: 16  of 24   Subjective  S:  Its improving some, but I still have spasms.   Pain Assessment Currently in Pain?: No/denies  Precautions/Restrictions   progress as tolerated   Exercise/Treatments Supine Protraction: PROM;10 reps;AAROM;15 reps Horizontal ABduction: PROM;10 reps;AAROM;15 reps External Rotation: PROM;10 reps;AAROM;15 reps Internal Rotation: PROM;10 reps;AAROM;15 reps Flexion: PROM;10 reps;AAROM;15 reps ABduction: PROM;10 reps;AAROM;15 reps Seated Elevation: AROM;15 reps Extension: AROM;15 reps Retraction: AROM;15 reps Row: AROM;15 reps Protraction: AAROM;10 reps Horizontal ABduction: AAROM;10 reps;Limitations Horizontal ABduction Limitations: with assistance from OT for proper positioning External Rotation: AAROM;10 reps Internal Rotation: AAROM;10 reps Flexion: AAROM;10 reps Therapy Ball Flexion: 25 reps ROM / Strengthening / Isometric Strengthening Proximal Shoulder Strengthening, Seated: 10 reps with rests between each exercise        Manual Therapy Manual Therapy: Myofascial release Myofascial Release: MFR and manual stretching to left upper anterior and lateral arm, clavicle, and scapular region to decrease pain and restrictions and improve mobility avoiding area over shunt 115-125  Occupational Therapy Assessment and Plan OT Assessment and Plan Clinical Impression Statement: A:  Attempted joint mobs to left shoulder with towel under scapular region to decrease pain and restrictions and increase pain free mobility.   Patient unable to tolerate mobs.  Increased to 2# dowel rod in supine.  Patient does not demonstrate sufficient AAROM/PROM to progress to AROM exercises at this point.  OT Plan: P:  Focus on scapular stability and rhomboid strength to increase correct positioning of shoulder joint.     Goals Short Term Goals Time to Complete Short Term Goals: 4 weeks Short Term Goal 1: Patient will be educated on a HEP. Short Term Goal 2: Patient will increase PROM in left shoulder to [facility] for increased ability to comb her hair. Short Term Goal 3: Patient will increase strength in her left shoulder to 4-/5 for increased ability to pick up supplies when cooking. Short Term Goal 4: Patient will decrease pain in her left shoulder to 3/10 when cooking or taking photographs. Short Term Goal 5: Patient will decrease fascial restrictions in her left shoulder to moderate for increased comfort when sleeping. Long Term Goals Time to Complete Long Term Goals: 8 weeks Long Term Goal 1: Patient will return to prior level of I with all B/IADLs and leisure activities.  Long Term Goal 2: Patient will increase AROM in her left shoulder to [facility] for increased ability to reach into overhead cabinets. Long Term Goal 3: Patient will increase her left shoulder strength to 4+/5 for increased ability to lift bags of groceries. Long Term Goal 4: Patient will decrease pain in her left shoulder to 1/10 when completing functional activities. Long Term Goal 5: Patient will decrease fascial restrictions to minimal for increased comfort when sleeping on left side.   Problem List Patient Active Problem List  Diagnosis  . CARPAL TUNNEL SYNDROME  . HYPERTENSION  . BACK PAIN  . DIABETES MELLITUS, TYPE II  . Light-headedness  . Hearing loss in right ear  . Malignant paraganglioma  . GERD (gastroesophageal reflux disease)  . Hypothyroid  . Hyperlipidemia LDL goal < 130  .  Pain in joint, shoulder region  . Muscle weakness (generalized)     End of Session Activity Tolerance: Patient tolerated treatment well General Behavior During Session: [facility] for tasks performed Cognition: [facility] for tasks performed   Shirlean Mylar, OTR/L  02/03/2012, 2:14 PM

## 2012-02-03 NOTE — Assessment & Plan Note (Signed)
Blood pressure is under much better control. We have discussed 2 g sodium diet. Follow-up with Dr. Dietrich Pates in 6 months.

## 2012-02-03 NOTE — Patient Instructions (Signed)
Follow-up with Dr. Dietrich Pates in 6 months  Continue your current medications as directed.   2 Gram Low Sodium Diet A 2 gram sodium diet restricts the amount of sodium in the diet to no more than 2 g or 2000 mg daily. Limiting the amount of sodium is often used to help lower blood pressure  QUICK TIPS  Do not add salt to food.  Avoid convenience items and fast food.  Choose unsalted snack foods.  Buy lower sodium products, often labeled as "lower sodium" or "no salt added."  Check food labels to learn how much sodium is in 1 serving.  When eating at a restaurant, ask that your food be prepared with less salt or none, if possible.   READING FOOD LABELS FOR SODIUM INFORMATION The nutrition facts label is a good place to find how much sodium is in foods. Look for products with no more than 500 to 600 mg of sodium per meal and no more than 150 mg per serving. Remember that 2 g = 2000 mg. The food label may also list foods as:  Sodium-free: Less than 5 mg in a serving.  Very low sodium: 35 mg or less in a serving.  Low-sodium: 140 mg or less in a serving.  Light in sodium: 50% less sodium in a serving. For example, if a food that usually has 300 mg of sodium is changed to become light in sodium, it will have 150 mg of sodium.  Reduced sodium: 25% less sodium in a serving. For example, if a food that usually has 400 mg of sodium is changed to reduced sodium, it will have 300 mg of sodium.   CHOOSING FOODS Grains  Avoid: Salted crackers and snack items. Some cereals, including instant hot cereals. Bread stuffing and biscuit mixes. Seasoned rice or pasta mixes.  Choose: Unsalted snack items. Low-sodium cereals, oats, puffed wheat and rice, shredded wheat. English muffins and bread. Pasta. Meats  Avoid: Salted, canned, smoked, spiced, pickled meats, including fish and poultry. Bacon, ham, sausage, cold cuts, hot dogs, anchovies.  Choose: Low-sodium canned tuna and salmon. Fresh  or frozen meat, poultry, and fish. Dairy  Avoid: Processed cheese and spreads. Cottage cheese. Buttermilk and condensed milk. Regular cheese.  Choose: Milk. Low-sodium cottage cheese. Yogurt. Sour cream. Low-sodium cheese. Fruits and Vegetables  Avoid: Regular canned vegetables. Regular canned tomato sauce and paste. Frozen vegetables in sauces. Olives. Rosita Fire. Relishes. Sauerkraut.  Choose: Low-sodium canned vegetables. Low-sodium tomato sauce and paste. Frozen or fresh vegetables. Fresh and frozen fruit. Condiments  Avoid: Canned and packaged gravies. Worcestershire sauce. Tartar sauce. Barbecue sauce. Soy sauce. Steak sauce. Ketchup. Onion, garlic, and table salt. Meat flavorings and tenderizers.  Choose: Fresh and dried herbs and spices. Low-sodium varieties of mustard and ketchup. Lemon juice. Tabasco sauce. Horseradish.   SAMPLE 2 GRAM SODIUM MEAL PLAN Breakfast / Sodium (mg)  1 cup low-fat milk / 143 mg  2 slices whole-wheat toast / 270 mg  1 tbs heart-healthy margarine / 153 mg  1 hard-boiled egg / 139 mg  1 small orange / 0 mg Lunch / Sodium (mg)  1 cup raw carrots / 76 mg   cup hummus / 298 mg  1 cup low-fat milk / 143 mg   cup red grapes / 2 mg  1 whole-wheat pita bread / 356 mg Dinner / Sodium (mg)  1 cup whole-wheat pasta / 2 mg  1 cup low-sodium tomato sauce / 73 mg  3 oz lean ground beef /  57 mg  1 small side salad (1 cup raw spinach leaves,  cup cucumber,  cup yellow bell pepper) with 1 tsp olive oil and 1 tsp red wine vinegar / 25 mg Snack / Sodium (mg)  1 container low-fat vanilla yogurt / 107 mg  3 graham cracker squares / 127 mg Nutrient Analysis  Calories: 2033  Protein: 77 g  Carbohydrate: 282 g  Fat: 72 g  Sodium: 1971 mg Document Released: 03/22/2005 Document Revised: 06/14/2011 Document Reviewed: 06/23/2009 [employer] Patient Information 2013 [employer] low a low sodium diet. Please see the attached information

## 2012-02-07 ENCOUNTER — Ambulatory Visit (HOSPITAL_COMMUNITY)
Admission: RE | Admit: 2012-02-07 | Discharge: 2012-02-07 | Disposition: A | Discharge status: Home / Self Care | Payer: Medicare HMO | Source: Ambulatory Visit | Attending: Family Medicine | Admitting: Family Medicine

## 2012-02-07 DIAGNOSIS — IMO0001 Reserved for inherently not codable concepts without codable children: Secondary | ICD-10-CM | POA: Insufficient documentation

## 2012-02-07 DIAGNOSIS — M25619 Stiffness of unspecified shoulder, not elsewhere classified: Secondary | ICD-10-CM | POA: Insufficient documentation

## 2012-02-07 DIAGNOSIS — M6281 Muscle weakness (generalized): Secondary | ICD-10-CM

## 2012-02-07 DIAGNOSIS — I10 Essential (primary) hypertension: Secondary | ICD-10-CM | POA: Insufficient documentation

## 2012-02-07 DIAGNOSIS — M25519 Pain in unspecified shoulder: Secondary | ICD-10-CM

## 2012-02-07 DIAGNOSIS — E119 Type 2 diabetes mellitus without complications: Secondary | ICD-10-CM | POA: Insufficient documentation

## 2012-02-07 NOTE — Progress Notes (Signed)
Occupational Therapy Treatment Patient Details  Name: AMAIYA SCRUTON MRN: [id number] Date of Birth: 1950-11-20  Today's Date: 02/07/2012 Time: 9147-8295 OT Time Calculation (min): 48 min Manual Therapy 6213-0865 19' Therapeutic Exercise 1037-1105 28'  Visit#: 17  of 24   Re-eval: 02/14/12    Authorization: Berdie Ogren authorized visits thru 03/10/12   Authorization Time Period: G code before 19th visit   Authorization Visit#: 16  of 24   Subjective Symptoms/Limitations Symptoms: S:  This thing hurt so much yesterday I couldn't even go to church, it was just spasming. Pain Assessment Currently in Pain?: Yes Pain Score:   3 Pain Location: Shoulder Pain Orientation: Left Pain Type: Acute pain  Precautions/Restrictions  Precautions Precaution Comments: (Shunt along L trapezius/cervical region, avoid during MT)  Exercise/Treatments  02/07/12 1000 Shoulder Exercises: Supine Protraction PROM;10 reps;AAROM;15 reps Horizontal ABduction PROM;10 reps;AAROM;15 reps External Rotation PROM;10 reps;AAROM;15 reps External Rotation Limitations very limited ROM Internal Rotation PROM;10 reps;AAROM;15 reps Flexion PROM;10 reps;AAROM;15 reps Flexion Limitations limited AAROM and PROM shoulder flexion ABduction PROM;10 reps;AAROM;15 reps Shoulder Exercises: Seated Extension Theraband;10 reps Theraband Level (Shoulder Extension) Level 2 (Red) Retraction Theraband;10 reps Theraband Level (Shoulder Retraction) Level 2 (Red) Row Theraband;10 reps Theraband Level (Shoulder Row) Level 2 (Red) Protraction AAROM;12 reps Horizontal ABduction AAROM;12 reps External Rotation AAROM;10 reps Internal Rotation AAROM;10 reps Flexion AAROM;10 reps Shoulder Exercises: Therapy Ball Flexion 25 reps ABduction 25 reps Right/Left 5 reps Shoulder Exercises: ROM/Strengthening Proximal Shoulder Strengthening, Seated 10 reps with rests between each exercise     Manual Therapy Manual Therapy:  Myofascial release Myofascial Release: MFR and manual stretching to left upper anterior and lateral arm, clavicle, and scapular region to decrease pain and restrictions and improve mobility avoiding area over shunt   Occupational Therapy Assessment and Plan OT Assessment and Plan Clinical Impression Statement: A:  Pt has moderate difficulty tolerating increased ROM and remains very limited and painful with PROM.  Added scapular strengthening with red band Rehab Potential: Excellent OT Plan: P:  Increase reps with bands.   Goals Short Term Goals Time to Complete Short Term Goals: 4 weeks Short Term Goal 1: Patient will be educated on a HEP. Short Term Goal 2: Patient will increase PROM in left shoulder to [facility] for increased ability to comb her hair. Short Term Goal 3: Patient will increase strength in her left shoulder to 4-/5 for increased ability to pick up supplies when cooking. Short Term Goal 4: Patient will decrease pain in her left shoulder to 3/10 when cooking or taking photographs. Short Term Goal 5: Patient will decrease fascial restrictions in her left shoulder to moderate for increased comfort when sleeping. Long Term Goals Time to Complete Long Term Goals: 8 weeks Long Term Goal 1: Patient will return to prior level of I with all B/IADLs and leisure activities.  Long Term Goal 2: Patient will increase AROM in her left shoulder to [facility] for increased ability to reach into overhead cabinets. Long Term Goal 3: Patient will increase her left shoulder strength to 4+/5 for increased ability to lift bags of groceries. Long Term Goal 4: Patient will decrease pain in her left shoulder to 1/10 when completing functional activities. Long Term Goal 5: Patient will decrease fascial restrictions to minimal for increased comfort when sleeping on left side.   Problem List Patient Active Problem List  Diagnosis  . CARPAL TUNNEL SYNDROME  . HYPERTENSION  . BACK PAIN  . DIABETES MELLITUS, TYPE  II  . Light-headedness  . Hearing loss  in right ear  . Malignant paraganglioma  . GERD (gastroesophageal reflux disease)  . Hypothyroid  . Hyperlipidemia LDL goal < 130  . Pain in joint, shoulder region  . Muscle weakness (generalized)    End of Session Activity Tolerance: Patient tolerated treatment well General Behavior During Session: [facility] for tasks performed Cognition: [facility] for tasks performed  GO    [name], Vennesa Bastedo L 02/07/2012, 3:51 PM

## 2012-02-08 ENCOUNTER — Ambulatory Visit (HOSPITAL_COMMUNITY): Payer: Medicare HMO | Admitting: Specialist

## 2012-02-09 ENCOUNTER — Ambulatory Visit (HOSPITAL_COMMUNITY)
Admission: RE | Admit: 2012-02-09 | Discharge: 2012-02-09 | Disposition: A | Discharge status: Home / Self Care | Payer: Medicare HMO | Source: Ambulatory Visit | Attending: Specialist | Admitting: Specialist

## 2012-02-09 DIAGNOSIS — M6281 Muscle weakness (generalized): Secondary | ICD-10-CM

## 2012-02-09 DIAGNOSIS — M25519 Pain in unspecified shoulder: Secondary | ICD-10-CM

## 2012-02-09 NOTE — Progress Notes (Addendum)
Occupational Therapy Treatment Patient Details  Name: Christine Dominguez MRN: [id number] Date of Birth: 10/31/1950  Today's Date: 02/09/2012 Time: 7829-5621 OT Time Calculation (min): 58 min Manual Therapy 3086-5784 24' Therapeutic Exercise 6962-9528 33'  Visit#: 18  of 24   Re-eval: 02/14/12 Assessment Diagnosis: Left Arm Pain  Authorization: [employer] authorized visits thru 03/10/12   Authorization Time Period: G code before 19th visit   Authorization Visit#: 17  of 24   Subjective Symptoms/Limitations Symptoms: S:  Last night I didn't have to have a pain pill or heating pad or anything and I slept fine.  Precautions/Restrictions  Precautions Precaution Comments: (Shunt along L trapezius/cervical region, avoid during MT)  Exercise/Treatments Supine Protraction: PROM;AAROM;15 reps;Other (comment) (with 2# bar) Horizontal ABduction: PROM;AAROM;15 reps;Other (comment) (with 2# bar) External Rotation: PROM;AAROM;15 reps;Other (comment) (with 2# bar) Internal Rotation: PROM;AAROM;15 reps;Other (comment) (with 2# bar) Flexion: PROM;AAROM;15 reps;Other (comment) (with 2# bar) ABduction: PROM;AAROM;15 reps;Other (comment) (with 2# bar) Seated Extension: Theraband;15 reps Theraband Level (Shoulder Extension): Level 2 (Red) Retraction: Theraband;15 reps Theraband Level (Shoulder Retraction): Level 2 (Red) Row: Theraband;15 reps Theraband Level (Shoulder Row): Level 2 (Red) Protraction: AAROM;15 reps Horizontal ABduction: AAROM;15 reps External Rotation: AAROM;15 reps Internal Rotation: AAROM;15 reps Flexion: AAROM;15 reps Pulleys Flexion: 3 minutes ABduction: 3 minutes Therapy Ball Flexion: 25 reps ABduction: 25 reps Right/Left: 5 reps ROM / Strengthening / Isometric Strengthening Wall Wash: 2' Thumb Tacks: 1' Proximal Shoulder Strengthening, Seated: 10 reps Prot/Ret//Elev/Dep: 1'      Manual Therapy Manual Therapy: Myofascial release Myofascial Release: MFR  and manual stretching to left upper anterior and lateral arm, clavicle, and scapular region to decrease pain and restrictions and improve mobility avoiding area over shunt   Occupational Therapy Assessment and Plan OT Assessment and Plan Clinical Impression Statement: A:  Added wall wash, thumbtacks and prot/ret/elev/dep. back into program with focus on retracting shoulder blade.  No rest breaks needed with prox shoulder strengthening today. OT Plan: P:  Increase reps with proximal shoulder strengthening since she completed reps today without rest breaks   Goals Short Term Goals Time to Complete Short Term Goals: 4 weeks Short Term Goal 1: Patient will be educated on a HEP. Short Term Goal 2: Patient will increase PROM in left shoulder to [facility] for increased ability to comb her hair. Short Term Goal 3: Patient will increase strength in her left shoulder to 4-/5 for increased ability to pick up supplies when cooking. Short Term Goal 4: Patient will decrease pain in her left shoulder to 3/10 when cooking or taking photographs. Short Term Goal 5: Patient will decrease fascial restrictions in her left shoulder to moderate for increased comfort when sleeping. Long Term Goals Time to Complete Long Term Goals: 8 weeks Long Term Goal 1: Patient will return to prior level of I with all B/IADLs and leisure activities.  Long Term Goal 2: Patient will increase AROM in her left shoulder to [facility] for increased ability to reach into overhead cabinets. Long Term Goal 3: Patient will increase her left shoulder strength to 4+/5 for increased ability to lift bags of groceries. Long Term Goal 4: Patient will decrease pain in her left shoulder to 1/10 when completing functional activities. Long Term Goal 5: Patient will decrease fascial restrictions to minimal for increased comfort when sleeping on left side.   Problem List Patient Active Problem List  Diagnosis  . CARPAL TUNNEL SYNDROME  . HYPERTENSION  . BACK  PAIN  . DIABETES MELLITUS, TYPE II  . Light-headedness  .  Hearing loss in right ear  . Malignant paraganglioma  . GERD (gastroesophageal reflux disease)  . Hypothyroid  . Hyperlipidemia LDL goal < 130  . Pain in joint, shoulder region  . Muscle weakness (generalized)    End of Session Activity Tolerance: Patient tolerated treatment well General Behavior During Session: [facility] for tasks performed Cognition: [facility] for tasks performed  GO    [name], Arlena Marsan L 02/09/2012, 12:04 PM

## 2012-02-10 ENCOUNTER — Ambulatory Visit (HOSPITAL_COMMUNITY): Payer: Medicare HMO | Admitting: Specialist

## 2012-02-11 ENCOUNTER — Ambulatory Visit (HOSPITAL_COMMUNITY): Payer: Medicare HMO | Admitting: Occupational Therapy

## 2012-02-14 ENCOUNTER — Ambulatory Visit (HOSPITAL_COMMUNITY)
Admission: RE | Admit: 2012-02-14 | Discharge: 2012-02-14 | Disposition: A | Discharge status: Home / Self Care | Payer: Medicare HMO | Source: Ambulatory Visit | Attending: Specialist | Admitting: Specialist

## 2012-02-14 DIAGNOSIS — M25519 Pain in unspecified shoulder: Secondary | ICD-10-CM

## 2012-02-14 DIAGNOSIS — M6281 Muscle weakness (generalized): Secondary | ICD-10-CM

## 2012-02-14 NOTE — Progress Notes (Signed)
Occupational Therapy Treatment Patient Details  Name: Christine Dominguez MRN: [id number] Date of Birth: 1950-06-29  Today's Date: 02/14/2012 Time: 4098-1191 OT Time Calculation (min): 22 min Manual Therapy 4782-9562 9' ROM/MMT 1308-6578 Visit#: 19  of 24   Re-eval: 02/14/12    Authorization: Berdie Ogren authorized visits thru 03/10/12   Authorization Time Period:    Authorization Visit#: 19  of    Subjective S:  I have been having sharp pains in my left arm.   Pain Assessment Currently in Pain?: Yes Pain Score:   3 Pain Location: Shoulder Pain Orientation: Left Pain Type: Acute pain  Precautions/Restrictions    Avoid shunt area during manual therapy Exercise/Treatments    Manual Therapy Myofascial Release: MFR and manual stretching to left upper anterior and lateral arm, clavicle, and scapular region to decrease pain and restrictions and improve mobility avoiding area over shunt 4696-2952  Occupational Therapy Assessment and Plan OT Assessment and Plan Clinical Impression Statement: A:  Patient has not made siginificant improvements in AROM, strength, function in the past month.  At this point, we discussed her progress and agree that skilled occupational therapy is not indicated.  I recommend following up with her primary MD for a referral to ortho.  OT Plan: P:  DC from skilled OT services as patient has plateaued in progress.  Please see dc summary for details   Goals Short Term Goals Time to Complete Short Term Goals: 4 weeks Short Term Goal 1: Patient will be educated on a HEP. Short Term Goal 1 Progress: Met Short Term Goal 2: Patient will increase PROM in left shoulder to [facility] for increased ability to comb her hair. Short Term Goal 2 Progress: Not met Short Term Goal 3: Patient will increase strength in her left shoulder to 4-/5 for increased ability to pick up supplies when cooking. Short Term Goal 3 Progress: Not met Short Term Goal 4: Patient will decrease  pain in her left shoulder to 3/10 when cooking or taking photographs. Short Term Goal 4 Progress: Not met Short Term Goal 5: Patient will decrease fascial restrictions in her left shoulder to moderate for increased comfort when sleeping. Short Term Goal 5 Progress: Met Long Term Goals Time to Complete Long Term Goals: 8 weeks Long Term Goal 1: Patient will return to prior level of I with all B/IADLs and leisure activities.  Long Term Goal 1 Progress: Not met Long Term Goal 2: Patient will increase AROM in her left shoulder to [facility] for increased ability to reach into overhead cabinets. Long Term Goal 2 Progress: Not met Long Term Goal 3: Patient will increase her left shoulder strength to 4+/5 for increased ability to lift bags of groceries. Long Term Goal 3 Progress: Not met Long Term Goal 4: Patient will decrease pain in her left shoulder to 1/10 when completing functional activities. Long Term Goal 4 Progress: Not met Long Term Goal 5: Patient will decrease fascial restrictions to minimal for increased comfort when sleeping on left side.  Long Term Goal 5 Progress: Not met  Problem List Patient Active Problem List  Diagnosis  . CARPAL TUNNEL SYNDROME  . HYPERTENSION  . BACK PAIN  . DIABETES MELLITUS, TYPE II  . Light-headedness  . Hearing loss in right ear  . Malignant paraganglioma  . GERD (gastroesophageal reflux disease)  . Hypothyroid  . Hyperlipidemia LDL goal < 130  . Pain in joint, shoulder region  . Muscle weakness (generalized)    End of Session Activity Tolerance: Patient tolerated  treatment well General Behavior During Session: [facility] for tasks performed Cognition: [facility] for tasks performed OT Plan of Care OT Home Exercise Plan: theraband and shoulder stretches. Consulted and Agree with Plan of Care: Patient  GO Functional Assessment Tool Used: UEFI administer and scored 55% I level and 45% impairment level was 42% I level at last assessment Functional Limitation:  Carrying, moving and handling objects Carrying, Moving and Handling Objects Goal Status (Z6109): At least 1 percent but less than 20 percent impaired, limited or restricted Carrying, Moving and Handling Objects Discharge Status [phone]): At least 40 percent but less than 60 percent impaired, limited or restricted  Shirlean Mylar, OTR/L  02/14/2012, 11:42 AM

## 2012-02-15 ENCOUNTER — Telehealth: Payer: Self-pay | Admitting: Family Medicine

## 2012-02-15 ENCOUNTER — Ambulatory Visit (INDEPENDENT_AMBULATORY_CARE_PROVIDER_SITE_OTHER): Payer: Medicare HMO | Admitting: Family Medicine

## 2012-02-15 ENCOUNTER — Encounter: Payer: Self-pay | Admitting: Family Medicine

## 2012-02-15 ENCOUNTER — Ambulatory Visit (HOSPITAL_COMMUNITY): Payer: Medicare HMO | Admitting: Specialist

## 2012-02-15 VITALS — BP 140/82 | HR 99 | Resp 18 | Ht 61.5 in | Wt 147.1 lb

## 2012-02-15 DIAGNOSIS — K219 Gastro-esophageal reflux disease without esophagitis: Secondary | ICD-10-CM

## 2012-02-15 DIAGNOSIS — M25519 Pain in unspecified shoulder: Secondary | ICD-10-CM

## 2012-02-15 DIAGNOSIS — E119 Type 2 diabetes mellitus without complications: Secondary | ICD-10-CM

## 2012-02-15 DIAGNOSIS — Z23 Encounter for immunization: Secondary | ICD-10-CM

## 2012-02-15 DIAGNOSIS — I10 Essential (primary) hypertension: Secondary | ICD-10-CM

## 2012-02-15 NOTE — Patient Instructions (Addendum)
F/u as before. Call if you need me sooner.  Please call and reschedule appt with Dr Dion Saucier you have been referred.  Flu vaccine today  You are doing better overall

## 2012-02-15 NOTE — Progress Notes (Signed)
  Subjective:    Patient ID: Christine Dominguez, female    DOB: Dec 13, 1950, 61 y.o.   MRN: [id number]  HPI 3 month h/o left shoulder pain and spasm with marked limitation in mobility, referred by [facility] oncologist to physical therapy which she has been doing with no improvement, here for help with this. Feels generally stronger and has improved appetite with weight gain   Review of Systems See HPI Denies recent fever or chills. Denies sinus pressure, nasal congestion, ear pain or sore throat. Denies chest congestion, productive cough or wheezing. Denies chest pains, palpitations and leg swelling Denies abdominal pain, nausea, vomiting,diarrhea or constipation.   Denies dysuria, frequency, hesitancy or incontinence.  Denies headaches, seizures, numbness, or tingling. Denies depression, anxiety or insomnia. Denies skin break down or rash.        Objective:   Physical Exam Patient alert and oriented and in no cardiopulmonary distress.  HEENT: No facial asymmetry, EOMI, no sinus tenderness,  oropharynx pink and moist.  Neck supple no adenopathy.  Chest: Clear to auscultation bilaterally.  CVS: S1, S2 no murmurs, no S3.  ABD: Soft non tender. Bowel sounds normal.  Ext: No edema  MS: Adequate ROM spine, marked reduced ROM right shoulder, normal ROM knees and ankles  Skin: Intact, no ulcerations or rash noted.  Psych: Good eye contact, normal affect. Memory intact not anxious or depressed appearing.  CNS: CN 2-12 intact, power, tone and sensation normal throughout.        Assessment & Plan:

## 2012-02-16 ENCOUNTER — Ambulatory Visit (HOSPITAL_COMMUNITY): Payer: Medicare HMO | Admitting: Occupational Therapy

## 2012-02-16 NOTE — Telephone Encounter (Signed)
Patient is aware also Dr. Lodema Hong gave her the telephone number before leaving to the doctors office

## 2012-02-18 ENCOUNTER — Ambulatory Visit (HOSPITAL_COMMUNITY): Payer: Medicare HMO | Admitting: Specialist

## 2012-02-21 ENCOUNTER — Ambulatory Visit (HOSPITAL_COMMUNITY): Payer: Medicare HMO | Admitting: Occupational Therapy

## 2012-02-22 ENCOUNTER — Ambulatory Visit (HOSPITAL_COMMUNITY): Payer: Medicare HMO | Admitting: Specialist

## 2012-02-23 ENCOUNTER — Ambulatory Visit (HOSPITAL_COMMUNITY): Payer: Medicare HMO | Admitting: Occupational Therapy

## 2012-02-25 ENCOUNTER — Ambulatory Visit (HOSPITAL_COMMUNITY): Payer: Medicare HMO | Admitting: Specialist

## 2012-02-25 NOTE — Assessment & Plan Note (Signed)
Controlled, no change in medication DASH diet and commitment to daily physical activity for a minimum of 30 minutes discussed and encouraged, as a part of hypertension management. The importance of attaining a healthy weight is also discussed.  

## 2012-02-25 NOTE — Assessment & Plan Note (Signed)
Controlled, no change in medication  

## 2012-02-25 NOTE — Assessment & Plan Note (Signed)
Marked limitation in right shoulder movement, will refer to orthopedics asap

## 2012-02-25 NOTE — Assessment & Plan Note (Signed)
Improved control and followed by endo

## 2012-02-28 ENCOUNTER — Ambulatory Visit (HOSPITAL_COMMUNITY): Payer: Medicare HMO | Admitting: Occupational Therapy

## 2012-02-29 ENCOUNTER — Ambulatory Visit (HOSPITAL_COMMUNITY): Payer: Medicare HMO | Admitting: Specialist

## 2012-03-01 ENCOUNTER — Ambulatory Visit (HOSPITAL_COMMUNITY): Payer: Medicare HMO | Admitting: Occupational Therapy

## 2012-03-06 ENCOUNTER — Ambulatory Visit (HOSPITAL_COMMUNITY): Payer: Medicare HMO | Admitting: Occupational Therapy

## 2012-03-08 ENCOUNTER — Ambulatory Visit (HOSPITAL_COMMUNITY): Payer: Medicare HMO | Admitting: Specialist

## 2012-03-10 ENCOUNTER — Ambulatory Visit (HOSPITAL_COMMUNITY): Payer: Medicare HMO | Admitting: Occupational Therapy

## 2012-03-17 ENCOUNTER — Telehealth: Payer: Self-pay | Admitting: Adult Health

## 2012-03-17 MED ORDER — VALSARTAN 320 MG PO TABS: 320.0000 mg | ORAL_TABLET | Freq: Every day | ORAL | Status: DC

## 2012-03-17 NOTE — Telephone Encounter (Signed)
rx sent to pharmacy by e-script  

## 2012-03-17 NOTE — Telephone Encounter (Signed)
PT NEED DIOVAN CALLED IN TO [facility] SHE IS COMPLETELY OUT.

## 2012-03-30 ENCOUNTER — Telehealth: Payer: Self-pay | Admitting: Family Medicine

## 2012-03-30 NOTE — Telephone Encounter (Signed)
Patient aware and transferred to front to schedule appt.

## 2012-03-30 NOTE — Telephone Encounter (Signed)
pls ask her to  schedule a work in next Monday re the rash, she is calling about this and I need to see ity, this was not treated at last visit over 2 months ago.  Pls let scheduling staff know the plan

## 2012-03-31 ENCOUNTER — Telehealth: Payer: Self-pay | Admitting: Adult Health

## 2012-03-31 MED ORDER — VALSARTAN 320 MG PO TABS: 320.0000 mg | ORAL_TABLET | Freq: Every day | ORAL | Status: DC

## 2012-03-31 NOTE — Telephone Encounter (Signed)
rx sent to pharmacy by e-script to [facility]

## 2012-03-31 NOTE — Telephone Encounter (Signed)
Please send refill of Diovan to [facility]. / tgs

## 2012-04-03 ENCOUNTER — Ambulatory Visit (INDEPENDENT_AMBULATORY_CARE_PROVIDER_SITE_OTHER): Payer: Medicare HMO | Admitting: Family Medicine

## 2012-04-03 ENCOUNTER — Encounter: Payer: Self-pay | Admitting: Family Medicine

## 2012-04-03 ENCOUNTER — Telehealth: Payer: Self-pay | Admitting: Cardiology

## 2012-04-03 VITALS — BP 150/80 | HR 80 | Resp 16 | Ht 61.5 in | Wt 154.0 lb

## 2012-04-03 DIAGNOSIS — E119 Type 2 diabetes mellitus without complications: Secondary | ICD-10-CM

## 2012-04-03 DIAGNOSIS — B369 Superficial mycosis, unspecified: Secondary | ICD-10-CM | POA: Insufficient documentation

## 2012-04-03 DIAGNOSIS — I10 Essential (primary) hypertension: Secondary | ICD-10-CM

## 2012-04-03 MED ORDER — CLOTRIMAZOLE-BETAMETHASONE 1-0.05 % EX CREA: TOPICAL_CREAM | CUTANEOUS | Status: DC

## 2012-04-03 MED ORDER — HYDROCHLOROTHIAZIDE 12.5 MG PO CAPS: 12.5000 mg | ORAL_CAPSULE | ORAL | Status: DC | PRN

## 2012-04-03 MED ORDER — NYSTATIN 100000 UNIT/GM EX POWD: CUTANEOUS | Status: DC

## 2012-04-03 MED ORDER — FLUCONAZOLE 100 MG PO TABS: 100.0000 mg | ORAL_TABLET | Freq: Every day | ORAL | Status: DC

## 2012-04-03 NOTE — Telephone Encounter (Signed)
Patient needs Hydrochlorothiazide sent to [facility]. / tgs

## 2012-04-03 NOTE — Patient Instructions (Addendum)
F/u as before.  Please do not take zocor for cholesterol for the week that you are taking the tablet for yeast, fluconazole.  Use anti fungal powder, and cream for yeast infection as directed  Keep area clean and dry, call if worsens

## 2012-04-03 NOTE — Progress Notes (Signed)
  Subjective:    Patient ID: Christine Dominguez, female    DOB: 1951-02-14, 61 y.o.   MRN: [id number]  HPI 1 week h/o pruritic red rash on lower abdomen , has had similar in the past with god response to topical meds. States recently had similar rash on low back upper buttock which has cleared. Denies fever or purulent drainage   Review of Systems See HPI Denies recent fever or chills. Denies sinus pressure, nasal congestion, ear pain or sore throat. Denies chest congestion, productive cough or wheezing. Denies chest pains, palpitations and leg swelling Denies abdominal pain, nausea, vomiting,diarrhea or constipation.   Denies dysuria, frequency, hesitancy or incontinence. Denies joint pain, swelling and limitation in mobility. Denies headaches, seizures, numbness, or tingling. Denies depression, anxiety or insomnia.       Objective:   Physical Exam Patient alert and oriented and in no cardiopulmonary distress.  HEENT: No facial asymmetry, EOMI, .  Neck supple  Chest: Clear to auscultation bilaterally.  CVS: S1, S2 no murmurs, no S3.  ABD: Soft non tender. Bowel sounds normal.  Ext: No edema  MS: Adequate ROM spine, shoulders, hips and knees.  Skin: erythematous rash on lowe abdominal wall in skin fold across entire lower abdomen, fungal and yeast infection noted Psych: Good eye contact, normal affect. Memory intact not anxious or depressed appearing.  CNS: CN 2-12 intact, power, tone and sensation normal throughout.        Assessment & Plan:

## 2012-04-04 NOTE — Assessment & Plan Note (Signed)
Uncontrolled, followed by endo 

## 2012-04-04 NOTE — Assessment & Plan Note (Signed)
Significant yeast and fungal infection across entire lower abdominal wall, will treat aggressively. No sign of bacterial superinfection

## 2012-04-04 NOTE — Assessment & Plan Note (Signed)
Elevated at this visit, has been intolerant of med adjustment in the past with light headedness. No med change encouraged low sodium diet

## 2012-04-11 ENCOUNTER — Encounter: Payer: Self-pay | Admitting: Family Medicine

## 2012-04-11 ENCOUNTER — Ambulatory Visit (INDEPENDENT_AMBULATORY_CARE_PROVIDER_SITE_OTHER): Payer: Medicare HMO | Admitting: Family Medicine

## 2012-04-11 VITALS — BP 160/80 | HR 92 | Resp 16 | Ht 61.5 in | Wt 157.0 lb

## 2012-04-11 DIAGNOSIS — Z0000 Encounter for general adult medical examination without abnormal findings: Secondary | ICD-10-CM

## 2012-04-11 NOTE — Patient Instructions (Addendum)
F/U  In 4.5 to 5 month, call if you need me before  Continue to be involved in [place] and community  Please call re physical therapy in [place] I will also send a note

## 2012-04-11 NOTE — Progress Notes (Signed)
Subjective:    Patient ID: Christine Dominguez, female    DOB: 07-27-1950, 62 y.o.   MRN: [id number]  HPI Preventive Screening-Counseling & Management   Patient present here today for a Medicare annual wellness visit.   Current Problems (verified)   Medications Prior to Visit Allergies (verified)   PAST HISTORY  Family History:Mother died of lung cancer  Age 25, father died when pt was 6y/o unknown  Cause. 3 bothers 1 sister, 1 died of a stroke at age 52 , diabetes in the others   Social History Married x 5 years, 2nd marriage, mother of 1 son murdered at age 92, 15 years ago. No current alcohol, tobacco or street drug use, quit nicotine x 16 years  Risk Factors  Current exercise habits:  Walking  4 to 5 days per week approx 10 to 15 mins, goal is 30 mins  Dietary issues discussed:low carb, low fat diet   Cardiac risk factors: None significant  Depression Screen  (Note: if answer to either of the following is "Yes", a more complete depression screening is indicated)   Over the past two weeks, have you felt down, depressed or hopeless? No  Over the past two weeks, have you felt little interest or pleasure in doing things? No  Have you lost interest or pleasure in daily life? No  Do you often feel hopeless? No  Do you cry easily over simple problems? No   Activities of Daily Living  In your present state of health, do you have any difficulty performing the following activities?  Driving?: No Managing money?: No Feeding yourself?:No Getting from bed to chair?:No Climbing a flight of stairs?:No Preparing food and eating?:No Bathing or showering?:No Getting dressed?:No Getting to the toilet?:No Using the toilet?:No Moving around from place to place?: No  Fall Risk Assessment In the past year have you fallen or had a near fall?:No Are you currently taking any medications that make you dizziness?:No   Hearing Difficulties: No Do you often ask people to speak up  or repeat themselves?:No Do you experience ringing or noises in your ears?:yes, right ear Do you have difficulty understanding soft or whispered voices?:right hearing loss  Cognitive Testing  Alert? Yes Normal Appearance?Yes  Oriented to person? Yes Place? Yes  Time? Yes  Displays appropriate judgment?Yes  Can read the correct time from a watch face? yes Are you having problems remembering things?No  Advanced Directives have been discussed with the patient?Yes , full code   List the Names of Other Physician/Practitioners you currently use: Dr. Fransico Him, [facility], Dr Dietrich Pates, Dr Emelda Fear, Dr Dion Saucier     Assessment:    Annual Wellness Exam   Plan:    During the course of the visit the patient was educated and counseled about appropriate screening and preventive services including:  A healthy diet is rich in fruit, vegetables and whole grains. Poultry fish, nuts and beans are a healthy choice for protein rather then red meat. A low sodium diet and drinking 64 ounces of water daily is generally recommended. Oils and sweet should be limited. Carbohydrates especially for those who are diabetic or overweight, should be limited to 30-45 gram per meal. It is important to eat on a regular schedule, at least 3 times daily. Snacks should be primarily fruits, vegetables or nuts. It is important that you exercise regularly at least 30 minutes 5 times a week. If you develop chest pain, have severe difficulty breathing, or feel very tired, stop exercising immediately  and seek medical attention  Immunization reviewed and updated. Cancer screening reviewed and updated    Patient Instructions (the written plan) was given to the patient.  Medicare Attestation  I have personally reviewed:  The patient's medical and social history  Their use of alcohol, tobacco or illicit drugs  Their current medications and supplements  The patient's functional ability including ADLs,fall risks, home safety  risks, cognitive, and hearing and visual impairment  Diet and physical activities  Evidence for depression or mood disorders  The patient's weight, height, BMI, and visual acuity have been recorded in the chart. I have made referrals, counseling, and provided education to the patient based on review of the above and I have provided the patient with a written personalized care plan for preventive services.      Review of Systems     Objective:   Physical Exam        Assessment & Plan:

## 2012-04-26 ENCOUNTER — Ambulatory Visit (HOSPITAL_COMMUNITY): Admission: RE | Admit: 2012-04-26 | Payer: Medicare HMO | Source: Ambulatory Visit

## 2012-05-01 ENCOUNTER — Ambulatory Visit (HOSPITAL_COMMUNITY)
Admission: RE | Admit: 2012-05-01 | Discharge: 2012-05-01 | Disposition: A | Discharge status: Home / Self Care | Payer: Medicare HMO | Source: Ambulatory Visit | Attending: Orthopedic Surgery | Admitting: Orthopedic Surgery

## 2012-05-01 DIAGNOSIS — M25519 Pain in unspecified shoulder: Secondary | ICD-10-CM

## 2012-05-01 DIAGNOSIS — I10 Essential (primary) hypertension: Secondary | ICD-10-CM | POA: Insufficient documentation

## 2012-05-01 DIAGNOSIS — E119 Type 2 diabetes mellitus without complications: Secondary | ICD-10-CM | POA: Insufficient documentation

## 2012-05-01 DIAGNOSIS — M6281 Muscle weakness (generalized): Secondary | ICD-10-CM | POA: Insufficient documentation

## 2012-05-01 DIAGNOSIS — M7500 Adhesive capsulitis of unspecified shoulder: Secondary | ICD-10-CM | POA: Insufficient documentation

## 2012-05-01 DIAGNOSIS — IMO0001 Reserved for inherently not codable concepts without codable children: Secondary | ICD-10-CM | POA: Insufficient documentation

## 2012-05-01 DIAGNOSIS — M25619 Stiffness of unspecified shoulder, not elsewhere classified: Secondary | ICD-10-CM | POA: Insufficient documentation

## 2012-05-01 NOTE — Evaluation (Signed)
Occupational Therapy Evaluation  Patient Details  Name: Christine Dominguez MRN: [id number] Date of Birth: 1950-07-11  Today's Date: 05/01/2012 Time: 1400-1430 OT Time Calculation (min): 30 min OT Evaluation 30' Visit#: 1  of 12   Re-eval: 05/29/12  Assessment Diagnosis: Left Shoulder Adhesive Capsulitis Prior Therapy: yes, OT for same diagnosis 9/13-11/13  Authorization: [employer] - requested 12 visits, Gcode needs updated before 10th visit  Authorization Time Period: before 10th visit  Authorization Visit#: 1  of 10    Past Medical History:  Past Medical History  Diagnosis Date  . Chronic back pain 2007    disabled   . Diabetes mellitus 1991  . Hypertension 1980  . Obesity   . Hydrocephalus     treated with VP shunt and then intracranial tumor resection (?Cholesteatoma); no longer followed actively by neurosurgery or otolaryngology  . 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 Ventriculoperitoneal shunt 1998    at [facility]  . Intracranial mass resected 1998    at [facility]; ? location-right middle ear  . Hemorrhoidectomy   . Trigger finger release     right thumb  . Tubal ligation 1980  . Colonoscopy 06/2010    Subjective S:  I went to Dr. Dion Saucier and he gave me a shot in my shoulder.  Its a little bit better now, but I still cant do everything I want to do.   Pertinent History: Christine Dominguez received OT services at this clinic for left shoulder adhesive capsulitis from 9/5-11/11.  She was discharged due to minimal progress towards her goals.  She consulted with Dr. Dion Saucier, received a cortisone injection in her left shoulder in November, and has been referred to occupational therapy services for evaluation and treatment.   Special Tests: UEFI scored 46/80  = 57% I level Patient Stated Goals: I want to use my left arm for any activitity I want to  without worrying about it hurting.  Pain Assessment Currently in Pain?: Yes Pain Score:   4 Pain Location: Shoulder Pain Orientation: Left Pain Type: Chronic pain  Precautions/Restrictions  Precautions Precautions:  (patient has a shunt in her left cervical region, avoid tract)  Prior Functioning  Home Living Lives With: Spouse Prior Function Level of Independence: Independent with basic ADLs;Independent with homemaking with ambulation Driving: Yes Vocation: Retired Leisure: Hobbies-yes (Comment) Comments: enjoys cooking, crafting, decorating, writing books and researching history with her husband  Assessment ADL/Vision/Perception ADL ADL Comments: She has difficulty reaching above shoulder height, fixing her hair, reaching out to the side.   Dominant Hand: Right  Cognition/Observation Cognition Overall Cognitive Status: Appears within functional limits for tasks assessed  Sensation/Coordination/Edema Sensation Light Touch: Appears Intact Coordination Gross Motor Movements are Fluid and Coordinated: Yes Fine Motor Movements are Fluid and Coordinated: Yes  Additional Assessments LUE AROM (degrees) LUE Overall AROM Comments: assessed in seated  ER/IR with shoulder adducted Left Shoulder Flexion: 95 Degrees Left Shoulder ABduction: 75 Degrees Left Shoulder Internal Rotation: 75 Degrees Left Shoulder External Rotation: 35 Degrees LUE PROM (degrees) LUE Overall PROM Comments: 50% in seated LUE Strength LUE Overall Strength Comments: assessed in seated.  ER/IR with shoulder adducted Left Shoulder Flexion: 3+/5 Left Shoulder ABduction: 3+/5 Left Shoulder Internal Rotation: 3+/5 Left Shoulder External Rotation: 3+/5 Palpation Palpation: max tightness at end of available range, max fascial restrictions in upper arm, scapular region, and tenderness with palpation  Occupational Therapy Assessment and Plan OT Assessment and Plan Clinical Impression Statement: A:   Patient presents with chronic stiffness and limited AROM/PROM/strength in her left shoulder region which is causing decreased I and participation in ADLs, leisure, and work activities.   Pt will benefit from skilled therapeutic intervention in order to improve on the following deficits: Decreased range of motion;Decreased strength;Pain;Increased fascial restricitons;Increased muscle spasms Rehab Potential: Good OT Frequency: Min 2X/week OT Duration: 6 weeks OT Treatment/Interventions: Self-care/ADL training;Therapeutic exercise;Manual therapy;Therapeutic activities;Patient/family education;Modalities OT Plan: P:  Skilled OT intervention is indicated to decrease fascial restrictions and tightness, and increase pain free mobility in her left shoulder for greater use of her LUE with all functional and leisure activities.  Treatment Plan:  MFR and manual stretching to left shoulder and upper arm region.  PROM and AAROM in supine.  ball stretches, thumbtacks, w arms, scapular stability and positioning exercises.  Progress as tolerated.     Goals Short Term Goals Time to Complete Short Term Goals: 3 weeks Short Term Goal 1: Patient will be educated on a HEP. Short Term Goal 2: Patient will increase PROM to 75% for increased ability to reach to comb her hair with her left arm. Short Term Goal 3: Patient will increase left shoulder strength to 4-/5 for increased ability to lift plates into cabinet. Short Term Goal 4: Patient will decrease pain in her left shoulder to 2/10 with functional activities.  Short Term Goal 5: Patient will decrease fascial restrictions and tightness to moderate in her left shoulder region.  Long Term Goals Time to Complete Long Term Goals: 6 weeks Long Term Goal 1: Patient will use left upper extremity actively with all functional activities, leisure activities, and B/IADLs. Long Term Goal 2: Patient will increase AROM to 75% for increased ability to reach into overhead shelves  in her closet. Long Term Goal 3: Patient will increase left shoulder strength to 4+/5 for increased ability to lift plates into cabinet. Long Term Goal 4: Patient will decrease pain in her left shoulder to 1/10 with functional activities.  Long Term Goal 5: Patient will decrease fascial restrictions and tightness to minimal in her left shoulder region.   Problem List Patient Active Problem List  Diagnosis  . CARPAL TUNNEL SYNDROME  . HYPERTENSION  . BACK PAIN  . DIABETES MELLITUS, TYPE II  . Light-headedness  . Hearing loss in right ear  . Malignant paraganglioma  . GERD (gastroesophageal reflux disease)  . Hypothyroid  . Hyperlipidemia LDL goal < 130  . Pain in joint, shoulder region  . Muscle weakness (generalized)  . Dermatomycosis  . Adhesive capsulitis of shoulder    End of Session Activity Tolerance: Patient tolerated treatment well General Behavior During Session: [facility] for tasks performed Cognition: [facility] for tasks performed OT Plan of Care OT Home Exercise Plan: Educated on shoulder stretches and cervical stretches.   Consulted and Agree with Plan of Care: Patient  GO Functional Assessment Tool Used: UEFI scored 57% Independence level, 43% impairment level Functional Limitation: Carrying, moving and handling objects Carrying, Moving and Handling Objects Current Status ([id number]): At least 40 percent but less than 60 percent impaired, limited or restricted Carrying, Moving and Handling Objects Goal Status [phone]): At least 1 percent but less than 20 percent impaired, limited or restricted  Shirlean Mylar, OTR/L  05/01/2012, 4:57 PM  Physician Documentation Your signature is required to indicate approval of the treatment plan as stated above.  Please sign and either send electronically or make  a copy of this report for your files and return this physician signed original.  Please mark one 1.__approve of plan  2. ___approve of plan with the following  conditions.   ______________________________                                                          _____________________ Physician Signature                                                                                                             Date

## 2012-05-04 ENCOUNTER — Telehealth: Payer: Self-pay | Admitting: Family Medicine

## 2012-05-04 ENCOUNTER — Inpatient Hospital Stay (HOSPITAL_COMMUNITY): Admission: RE | Admit: 2012-05-04 | Payer: Medicare HMO | Source: Ambulatory Visit

## 2012-05-05 ENCOUNTER — Other Ambulatory Visit: Payer: Self-pay | Admitting: Family Medicine

## 2012-05-05 MED ORDER — CEPHALEXIN 500 MG PO CAPS: 500.0000 mg | ORAL_CAPSULE | Freq: Four times a day (QID) | ORAL | Status: AC

## 2012-05-05 NOTE — Telephone Encounter (Signed)
Patient called back and said that the rash on her abdomen is back. It had went away with the antibiotics she was given but now it has came back and its draining a small amount of yellow pus. Please advise  [address]

## 2012-05-05 NOTE — Telephone Encounter (Signed)
I have sent in keflex to [facility], please let her know, if this oes not improve a lot in the next 3 days she needs to make appt to be seen

## 2012-05-05 NOTE — Telephone Encounter (Signed)
Pt aware.

## 2012-05-09 ENCOUNTER — Ambulatory Visit (HOSPITAL_COMMUNITY)
Admission: RE | Admit: 2012-05-09 | Discharge: 2012-05-09 | Disposition: A | Discharge status: Home / Self Care | Payer: Medicare HMO | Source: Ambulatory Visit | Attending: Family Medicine | Admitting: Family Medicine

## 2012-05-09 DIAGNOSIS — I10 Essential (primary) hypertension: Secondary | ICD-10-CM | POA: Insufficient documentation

## 2012-05-09 DIAGNOSIS — M7500 Adhesive capsulitis of unspecified shoulder: Secondary | ICD-10-CM

## 2012-05-09 DIAGNOSIS — M25619 Stiffness of unspecified shoulder, not elsewhere classified: Secondary | ICD-10-CM | POA: Insufficient documentation

## 2012-05-09 DIAGNOSIS — E119 Type 2 diabetes mellitus without complications: Secondary | ICD-10-CM | POA: Insufficient documentation

## 2012-05-09 DIAGNOSIS — IMO0001 Reserved for inherently not codable concepts without codable children: Secondary | ICD-10-CM | POA: Insufficient documentation

## 2012-05-09 DIAGNOSIS — M6281 Muscle weakness (generalized): Secondary | ICD-10-CM | POA: Insufficient documentation

## 2012-05-09 DIAGNOSIS — M25519 Pain in unspecified shoulder: Secondary | ICD-10-CM | POA: Insufficient documentation

## 2012-05-09 NOTE — Progress Notes (Signed)
Occupational Therapy Treatment Patient Details  Name: Christine Dominguez MRN: [id number] Date of Birth: 17-Dec-1950  Today's Date: 05/09/2012 Time: 4098-1191 OT Time Calculation (min): 39 min Massage 4782-9562 16' Therex 1308-6578 23'  Visit#: 2  of 12   Re-eval: 05/29/12    Authorization: [employer] - requested 12 visits, Gcode needs updated before 10th visit  Authorization Time Period: before 10th visit  Authorization Visit#: 2  of 10   Subjective Symptoms/Limitations Symptoms: S: My arm is a little sore today. But it feels good overall. Pain Assessment Currently in Pain?: Yes Pain Score:   4 Pain Location: Shoulder Pain Orientation: Left Pain Type: Chronic pain  Precautions/Restrictions  Precautions Precautions: None;Other (comment) Precaution Comments: (Shunt along L trapezius/cervical region, avoid during MT)  Exercise/Treatments Supine Protraction: PROM;10 reps Horizontal ABduction: PROM;10 reps External Rotation: PROM;10 reps External Rotation Limitations: very limited ROM Internal Rotation: PROM;10 reps Flexion: PROM;10 reps Flexion Limitations: very limited ROM ABduction: PROM;10 reps Seated Elevation: AROM;10 reps Extension: AROM;10 reps Row: AROM;10 reps Therapy Ball Flexion: 15 reps ABduction: 15 reps Right/Left: 5 reps ROM / Strengthening / Isometric Strengthening Thumb Tacks: 1'      Manual Therapy Manual Therapy: Massage Massage: massage and manual stretching to left upper anterior and lateral arm, clavicle, and scapular region to decreaserestrictions and improve mobility avoiding area over shunt.   Occupational Therapy Assessment and Plan OT Assessment and Plan Clinical Impression Statement: A: Patient presented with limited PROM during shoulder abduction.  OT Plan: P: cont. with PROM in supine until [facility] then progress to [facility].   Goals Short Term Goals Time to Complete Short Term Goals: 3 weeks Short Term Goal 1: Patient will  be educated on a HEP. Short Term Goal 1 Progress: Progressing toward goal Short Term Goal 2: Patient will increase PROM to 75% for increased ability to reach to comb her hair with her left arm. Short Term Goal 2 Progress: Progressing toward goal Short Term Goal 3: Patient will increase left shoulder strength to 4-/5 for increased ability to lift plates into cabinet. Short Term Goal 3 Progress: Progressing toward goal Short Term Goal 4: Patient will decrease pain in her left shoulder to 2/10 with functional activities.  Short Term Goal 4 Progress: Progressing toward goal Short Term Goal 5: Patient will decrease fascial restrictions and tightness to moderate in her left shoulder region.  Short Term Goal 5 Progress: Progressing toward goal Long Term Goals Time to Complete Long Term Goals: 6 weeks Long Term Goal 1: Patient will use left upper extremity actively with all functional activities, leisure activities, and B/IADLs. Long Term Goal 1 Progress: Progressing toward goal Long Term Goal 2: Patient will increase AROM to 75% for increased ability to reach into overhead shelves in her closet. Long Term Goal 2 Progress: Progressing toward goal Long Term Goal 3: Patient will increase left shoulder strength to 4+/5 for increased ability to lift plates into cabinet. Long Term Goal 3 Progress: Progressing toward goal Long Term Goal 4: Patient will decrease pain in her left shoulder to 1/10 with functional activities.  Long Term Goal 4 Progress: Progressing toward goal Long Term Goal 5: Patient will decrease fascial restrictions and tightness to minimal in her left shoulder region.  Long Term Goal 5 Progress: Progressing toward goal  Problem List Patient Active Problem List  Diagnosis  . CARPAL TUNNEL SYNDROME  . HYPERTENSION  . BACK PAIN  . DIABETES MELLITUS, TYPE II  . Light-headedness  . Hearing loss in right ear  .  Malignant paraganglioma  . GERD (gastroesophageal reflux disease)  .  Hypothyroid  . Hyperlipidemia LDL goal < 130  . Pain in joint, shoulder region  . Muscle weakness (generalized)  . Dermatomycosis  . Adhesive capsulitis of shoulder    End of Session Activity Tolerance: Patient tolerated treatment well General Behavior During Session: [facility] for tasks performed Cognition: [facility] for tasks performed   Limmie Patricia, OTR/L  05/09/2012, 1:51 PM

## 2012-05-12 ENCOUNTER — Ambulatory Visit (HOSPITAL_COMMUNITY)
Admission: RE | Admit: 2012-05-12 | Discharge: 2012-05-12 | Disposition: A | Discharge status: Home / Self Care | Payer: Medicare HMO | Source: Ambulatory Visit | Attending: Orthopedic Surgery | Admitting: Orthopedic Surgery

## 2012-05-12 DIAGNOSIS — M7500 Adhesive capsulitis of unspecified shoulder: Secondary | ICD-10-CM

## 2012-05-12 NOTE — Progress Notes (Signed)
Occupational Therapy Treatment Patient Details  Name: Christine Dominguez MRN: [id number] Date of Birth: 02/28/1951  Today's Date: 05/12/2012 Time: 4098-1191 OT Time Calculation (min): 38 min Manual Therapy 4782-9562 14' Therapeutic Exercises 1316-1340 24' Visit#: 3  of 12   Re-eval: 05/29/12    Authorization: [employer] - requested 12 visits, Gcode needs updated before 10th   Authorization Time Period: before 10th visit   Authorization Visit#: 3  of 10   Subjective S:  It hasnt been too bad, it was worse yesterday.   Pain Assessment Currently in Pain?: Yes Pain Score:   2 Pain Location: Shoulder Pain Orientation: Left Pain Type: Acute pain  Precautions/Restrictions   progress as tolerated  Exercise/Treatments Supine Protraction: PROM;AAROM;10 reps Horizontal ABduction: PROM;AAROM;10 reps External Rotation: PROM;AAROM;10 reps External Rotation Limitations: significant limitations Internal Rotation: PROM;AAROM;10 reps Flexion: PROM;AAROM;10 reps Flexion Limitations: 100 degrees AAROM ABduction: PROM;AAROM;10 reps Seated Elevation: AROM;10 reps Extension: AROM;10 reps Row: AROM;10 reps Therapy Ball Flexion: 20 reps ABduction: 20 reps Right/Left: 5 reps ROM / Strengthening / Isometric Strengthening Thumb Tacks: 1' Prot/Ret//Elev/Dep: 1'      Manual Therapy Manual Therapy: Myofascial release Myofascial Release: MFR and manual stretching to left upper anterior and lateral arm, clavicle, and scapular region to decrease pain and restrictions and improve mobility avoiding area over shunt 1308-6578  Occupational Therapy Assessment and Plan OT Assessment and Plan Clinical Impression Statement: A:  added AAROM in supine for increased stretch at shoulder joint for increased I with BIADLs. OT Plan: P:  Add pulleys to further progress AAROM/PROM.   Goals Short Term Goals Time to Complete Short Term Goals: 3 weeks Short Term Goal 1: Patient will be educated on  a HEP. Short Term Goal 2: Patient will increase PROM to 75% for increased ability to reach to comb her hair with her left arm. Short Term Goal 3: Patient will increase left shoulder strength to 4-/5 for increased ability to lift plates into cabinet. Short Term Goal 4: Patient will decrease pain in her left shoulder to 2/10 with functional activities.  Short Term Goal 5: Patient will decrease fascial restrictions and tightness to moderate in her left shoulder region.  Long Term Goals Time to Complete Long Term Goals: 6 weeks Long Term Goal 1: Patient will use left upper extremity actively with all functional activities, leisure activities, and B/IADLs. Long Term Goal 2: Patient will increase AROM to 75% for increased ability to reach into overhead shelves in her closet. Long Term Goal 3: Patient will increase left shoulder strength to 4+/5 for increased ability to lift plates into cabinet. Long Term Goal 4: Patient will decrease pain in her left shoulder to 1/10 with functional activities.  Long Term Goal 5: Patient will decrease fascial restrictions and tightness to minimal in her left shoulder region.   Problem List Patient Active Problem List  Diagnosis  . CARPAL TUNNEL SYNDROME  . HYPERTENSION  . BACK PAIN  . DIABETES MELLITUS, TYPE II  . Light-headedness  . Hearing loss in right ear  . Malignant paraganglioma  . GERD (gastroesophageal reflux disease)  . Hypothyroid  . Hyperlipidemia LDL goal < 130  . Pain in joint, shoulder region  . Muscle weakness (generalized)  . Dermatomycosis  . Adhesive capsulitis of shoulder    End of Session Activity Tolerance: Patient tolerated treatment well General Behavior During Session: [facility] for tasks performed Cognition: [facility] for tasks performed  GO    Shirlean Mylar, OTR/L  05/12/2012, 1:42 PM

## 2012-05-15 ENCOUNTER — Ambulatory Visit (HOSPITAL_COMMUNITY)
Admission: RE | Admit: 2012-05-15 | Discharge: 2012-05-15 | Disposition: A | Discharge status: Home / Self Care | Payer: Medicare HMO | Source: Ambulatory Visit | Attending: Family Medicine | Admitting: Family Medicine

## 2012-05-15 DIAGNOSIS — M7500 Adhesive capsulitis of unspecified shoulder: Secondary | ICD-10-CM

## 2012-05-15 NOTE — Progress Notes (Signed)
Occupational Therapy Treatment Patient Details  Name: Christine Dominguez MRN: [id number] Date of Birth: 1950/11/25  Today's Date: 05/15/2012 Time: 1310-1400 OT Time Calculation (min): 50 min Manual Therapy 1310-1326 16' Therapeutic Exercises 1326-1400 34' Visit#: 4 of 12  Re-eval: 05/29/12    Authorization: [employer] - requested 12 visits, Gcode needs updated before 10th   Authorization Time Period: before 10th visit.  [employer] covering visits through 06/15/12  Authorization Visit#: 4 of 12  Subjective S:  I had my hand back behind my head. Pain Assessment Currently in Pain?: Yes Pain Score:   3 Pain Location: Shoulder Pain Orientation: Left Pain Type: Chronic pain  Precautions/Restrictions   progress as tolerated  Exercise/Treatments Supine Protraction: PROM;10 reps;AAROM;12 reps Horizontal ABduction: PROM;10 reps;AAROM;12 reps External Rotation: PROM;10 reps;AAROM;12 reps Internal Rotation: PROM;10 reps;AAROM;12 reps Flexion: PROM;10 reps;AAROM;12 reps ABduction: PROM;10 reps;AAROM;12 reps Seated Extension: Theraband;10 reps Theraband Level (Shoulder Extension): Level 2 (Red) Retraction: Theraband;10 reps Theraband Level (Shoulder Retraction): Level 2 (Red) Row: Theraband;10 reps Theraband Level (Shoulder Row): Level 2 (Red) Pulleys Flexion: 2 minutes ABduction: 2 minutes Therapy Ball Flexion: 20 reps ABduction: 20 reps Right/Left: 5 reps ROM / Strengthening / Isometric Strengthening Wall Wash: 2' Thumb Tacks: 1' Proximal Shoulder Strengthening, Supine: 10 reps of each without resting between exercises x 2 sets Prot/Ret//Elev/Dep: 1'      Manual Therapy Manual Therapy: Myofascial release Myofascial Release: MFR and manual stretching to left upper anterior and lateral arm, clavicle, and scapular region to decrease pain and restrictions and improve mobility avoiding area over shunt 1310-1326  Occupational Therapy Assessment and Plan OT  Assessment and Plan Clinical Impression Statement: A:  Added pulleys, completed proximal shoulder strengthening in supine with good control and form. OT Plan: P:  Attempt AROM in supine and AAROM in seated for increasd range and increased scapular stability   Goals Short Term Goals Time to Complete Short Term Goals: 3 weeks Short Term Goal 1: Patient will be educated on a HEP. Short Term Goal 2: Patient will increase PROM to 75% for increased ability to reach to comb her hair with her left arm. Short Term Goal 3: Patient will increase left shoulder strength to 4-/5 for increased ability to lift plates into cabinet. Short Term Goal 4: Patient will decrease pain in her left shoulder to 2/10 with functional activities.  Short Term Goal 5: Patient will decrease fascial restrictions and tightness to moderate in her left shoulder region.  Long Term Goals Time to Complete Long Term Goals: 6 weeks Long Term Goal 1: Patient will use left upper extremity actively with all functional activities, leisure activities, and B/IADLs. Long Term Goal 2: Patient will increase AROM to 75% for increased ability to reach into overhead shelves in her closet. Long Term Goal 3: Patient will increase left shoulder strength to 4+/5 for increased ability to lift plates into cabinet. Long Term Goal 4: Patient will decrease pain in her left shoulder to 1/10 with functional activities.  Long Term Goal 5: Patient will decrease fascial restrictions and tightness to minimal in her left shoulder region.   Problem List Patient Active Problem List  Diagnosis  . CARPAL TUNNEL SYNDROME  . HYPERTENSION  . BACK PAIN  . DIABETES MELLITUS, TYPE II  . Light-headedness  . Hearing loss in right ear  . Malignant paraganglioma  . GERD (gastroesophageal reflux disease)  . Hypothyroid  . Hyperlipidemia LDL goal < 130  . Pain in joint, shoulder region  . Muscle weakness (generalized)  . Dermatomycosis  .  Adhesive capsulitis of  shoulder    End of Session Activity Tolerance: Patient tolerated treatment well General Behavior During Session: [facility] for tasks performed Cognition: [facility] for tasks performed  GO    Shirlean Mylar, OTR/L  05/15/2012, 3:08 PM

## 2012-05-18 ENCOUNTER — Ambulatory Visit (HOSPITAL_COMMUNITY): Payer: Medicare HMO

## 2012-05-19 DIAGNOSIS — Z0000 Encounter for general adult medical examination without abnormal findings: Secondary | ICD-10-CM | POA: Insufficient documentation

## 2012-05-19 NOTE — Assessment & Plan Note (Signed)
Annual wellness completed as documented. Pt has no restrictions in ability to care for herself, memory or mobility. She has had recurrent malignancy of head and neck area and has recovered extremely well, her positive outlook undoubtedly contributes to her iverall good health

## 2012-05-22 ENCOUNTER — Ambulatory Visit (HOSPITAL_COMMUNITY): Payer: Medicare HMO

## 2012-05-25 ENCOUNTER — Ambulatory Visit (HOSPITAL_COMMUNITY)
Admission: RE | Admit: 2012-05-25 | Discharge: 2012-05-25 | Disposition: A | Discharge status: Home / Self Care | Payer: Medicare HMO | Source: Ambulatory Visit | Attending: Orthopedic Surgery | Admitting: Orthopedic Surgery

## 2012-05-25 DIAGNOSIS — M7500 Adhesive capsulitis of unspecified shoulder: Secondary | ICD-10-CM

## 2012-05-25 NOTE — Progress Notes (Signed)
Occupational Therapy Treatment Patient Details  Name: VALMA ROTENBERG MRN: [id number] Date of Birth: [birth year]  Today's Date: 05/25/2012 Time: 1310-1350 OT Time Calculation (min): 40 min Manual Therapy 4098-1191 13' Therapeutic Exercises 1323-1350 27' Visit#: 5 of 12  Re-eval: 05/29/12    Authorization: [employer] - requested 12 visits, Gcode needs updated before 10th   Authorization Time Period: before 10th visit.  [employer] covering visits through 06/15/12  Authorization Visit#: 5 of 12  Subjective S:  I had some pain yesterday and had to take a pill. Pain Assessment Currently in Pain?: Yes Pain Score:   3 Pain Location: Shoulder Pain Orientation: Left Pain Type: Chronic pain  Precautions/Restrictions   progress as tolerated  Exercise/Treatments Supine Protraction: PROM;10 reps;AAROM;15 reps Horizontal ABduction: PROM;10 reps;AAROM;15 reps External Rotation: PROM;10 reps;AAROM;15 reps Internal Rotation: PROM;10 reps;AAROM;15 reps Flexion: PROM;10 reps;AAROM;15 reps;Limitations Flexion Limitations: after joint released with audible popping, patient had greater mobility with less pain ABduction: PROM;10 reps;AAROM;15 reps Seated Extension: Theraband;12 reps Theraband Level (Shoulder Extension): Level 2 (Red) Retraction: Theraband;12 reps Theraband Level (Shoulder Retraction): Level 2 (Red) Row: Theraband;12 reps Theraband Level (Shoulder Row): Level 2 (Red) Protraction: AAROM;10 reps Horizontal ABduction: AAROM;10 reps External Rotation: AAROM;10 reps Internal Rotation: AAROM;10 reps Flexion: AAROM;10 reps Pulleys Flexion: 2 minutes ABduction: 2 minutes Therapy Ball Flexion: 20 reps ABduction: 20 reps Right/Left: 5 reps ROM / Strengthening / Isometric Strengthening Proximal Shoulder Strengthening, Supine: resume next visit Prot/Ret//Elev/Dep: resume next visit Other ROM/Strengthening Exercises: walking fingers up wall near pulleys x 5 attempts  was able to reach 12.      Manual Therapy Manual Therapy: Myofascial release Myofascial Release: MFR and manual stretching to left upper anterior and lateral arm, clavicle, and scapular region to decrease pain and restrictions and improve mobility avoiding area over shunt 1310-1323  Occupational Therapy Assessment and Plan OT Assessment and Plan Clinical Impression Statement: A:  Added AAROM in seated, patient demonstrated good form with AAROM exercises in seated and supine.   Rehab Potential: Good OT Plan: P:  Add AROM in supine.  Increase range by increasing ability to reach to above 12 on wall climb exercises.   Goals Short Term Goals Time to Complete Short Term Goals: 3 weeks Short Term Goal 1: Patient will be educated on a HEP. Short Term Goal 1 Progress: Progressing toward goal Short Term Goal 2: Patient will increase PROM to 75% for increased ability to reach to comb her hair with her left arm. Short Term Goal 2 Progress: Progressing toward goal Short Term Goal 3: Patient will increase left shoulder strength to 4-/5 for increased ability to lift plates into cabinet. Short Term Goal 3 Progress: Progressing toward goal Short Term Goal 4: Patient will decrease pain in her left shoulder to 2/10 with functional activities.  Short Term Goal 4 Progress: Progressing toward goal Short Term Goal 5: Patient will decrease fascial restrictions and tightness to moderate in her left shoulder region.  Short Term Goal 5 Progress: Progressing toward goal Long Term Goals Time to Complete Long Term Goals: 6 weeks Long Term Goal 1: Patient will use left upper extremity actively with all functional activities, leisure activities, and B/IADLs. Long Term Goal 1 Progress: Progressing toward goal Long Term Goal 2: Patient will increase AROM to 75% for increased ability to reach into overhead shelves in her closet. Long Term Goal 2 Progress: Progressing toward goal Long Term Goal 3: Patient will increase  left shoulder strength to 4+/5 for increased ability to lift plates into cabinet.  Long Term Goal 3 Progress: Progressing toward goal Long Term Goal 4: Patient will decrease pain in her left shoulder to 1/10 with functional activities.  Long Term Goal 4 Progress: Progressing toward goal Long Term Goal 5: Patient will decrease fascial restrictions and tightness to minimal in her left shoulder region.  Long Term Goal 5 Progress: Progressing toward goal  Problem List Patient Active Problem List  Diagnosis  . CARPAL TUNNEL SYNDROME  . HYPERTENSION  . BACK PAIN  . DIABETES MELLITUS, TYPE II  . Light-headedness  . Hearing loss in right ear  . Malignant paraganglioma  . GERD (gastroesophageal reflux disease)  . Hypothyroid  . Hyperlipidemia LDL goal < 130  . Pain in joint, shoulder region  . Muscle weakness (generalized)  . Dermatomycosis  . Adhesive capsulitis of shoulder  . Routine general medical examination at a health care facility    Shirlean Mylar, OTR/L  05/25/2012, 1:59 PM

## 2012-05-29 ENCOUNTER — Ambulatory Visit (HOSPITAL_COMMUNITY): Payer: Medicare HMO

## 2012-06-01 ENCOUNTER — Ambulatory Visit (HOSPITAL_COMMUNITY): Payer: Medicare HMO | Admitting: Specialist

## 2012-06-06 ENCOUNTER — Telehealth: Payer: Self-pay | Admitting: Family Medicine

## 2012-06-12 ENCOUNTER — Telehealth: Payer: Self-pay

## 2012-06-12 MED ORDER — FLUCONAZOLE 100 MG PO TABS: 100.0000 mg | ORAL_TABLET | Freq: Every day | ORAL | Status: AC

## 2012-06-12 NOTE — Telephone Encounter (Signed)
Will refill diflucan in no improvement, she needs to come in Thurs or Friday to have this checked

## 2012-06-12 NOTE — Telephone Encounter (Signed)
Has a breakout of the same rash on her abdomen that she had last month. Wants to see if antibiotic could be sent in like last time. Husband just passed and she is still busy with that but can come in tomorrow if necessary

## 2012-06-12 NOTE — Telephone Encounter (Signed)
Patient aware.

## 2012-06-16 ENCOUNTER — Encounter: Payer: Self-pay | Admitting: Family Medicine

## 2012-06-16 ENCOUNTER — Ambulatory Visit (INDEPENDENT_AMBULATORY_CARE_PROVIDER_SITE_OTHER): Payer: Medicare HMO | Admitting: Family Medicine

## 2012-06-16 VITALS — BP 160/78 | HR 98 | Resp 16 | Wt 160.0 lb

## 2012-06-16 DIAGNOSIS — B372 Candidiasis of skin and nail: Secondary | ICD-10-CM

## 2012-06-16 DIAGNOSIS — B369 Superficial mycosis, unspecified: Secondary | ICD-10-CM

## 2012-06-16 DIAGNOSIS — E119 Type 2 diabetes mellitus without complications: Secondary | ICD-10-CM

## 2012-06-16 MED ORDER — NYSTATIN 100000 UNIT/GM EX POWD: CUTANEOUS | Status: DC

## 2012-06-16 MED ORDER — SULFAMETHOXAZOLE-TRIMETHOPRIM 800-160 MG PO TABS: 1.0000 | ORAL_TABLET | Freq: Two times a day (BID) | ORAL | Status: AC

## 2012-06-16 NOTE — Progress Notes (Signed)
  Subjective:    Patient ID: Christine Dominguez, female    DOB: March 13, 1951, 62 y.o.   MRN: [id number]  HPI  Patient presents with rash for the past week it initially started as a small pustule and then spread across her abdomen she had been in the hospital with her husband who recently passed. She does have history of previous rash was treated with antifungals about 2 months ago. She's been using nystatin powder as well as Lotrisone cream and she was started on Diflucan 2 days ago. She denies any fever but has had green discharge from the rash and is quite painful. Her blood sugars have been less than 130 in the evening therefore she's not been taking her insulin she is followed by endocrinology her last A1c was 7.8% in January     Review of Systems - per above  GEN- denies fatigue, fever, weight loss,weakness, recent illness HEENT- denies eye drainage, change in vision, nasal discharge, CVS- denies chest pain, palpitations RESP- denies SOB, cough, wheeze Neuro- denies headache, dizziness, syncope, seizure activity      Objective:   Physical Exam GEN- NAD, alert and oriented x3 ABD-Soft,TTP over rash, ND Skin- lower abdomen and beneath pannus- erythema with skin breakdown and foul smelling discharge throughout area, no pustular lesions, no induration EXT- No edema Pulses- Radial 2+        Assessment & Plan:

## 2012-06-16 NOTE — Patient Instructions (Signed)
Continue the nystatin powder on your stomach Do not use the cream Take the antibiotics as prescribed as well You have bacteria and yeast infection F/U Tuesday for a recheck

## 2012-06-17 DIAGNOSIS — B372 Candidiasis of skin and nail: Secondary | ICD-10-CM | POA: Insufficient documentation

## 2012-06-17 NOTE — Assessment & Plan Note (Signed)
Fairly well controlled, advised to take insulin as directed to keep sugars down with her skin infection

## 2012-06-17 NOTE — Assessment & Plan Note (Signed)
Beneath pannus, this is a recurrence, will use nystatin powder, bandage to be applied keep area dry Will also have her complete oral diflucan and add antibiotics to cover for superinfection based on appearance or purulent discharge noted She will return in 3 days for recheck, she may be referred to either wound clinic or derm depending re-exam

## 2012-06-20 ENCOUNTER — Ambulatory Visit: Payer: Medicare HMO | Admitting: Family Medicine

## 2012-06-20 ENCOUNTER — Telehealth: Payer: Self-pay | Admitting: Family Medicine

## 2012-06-20 NOTE — Telephone Encounter (Signed)
Spoke to Ms. scales. She's done well she has her medications as prescribed. She states that the rash is less red and is not tender and she thinks it is improving. She did speak with her PCP early this morning she cannot make the appointment secondary to the weather and has rescheduled she was advised what to look out for for rash changes she needs to come in before that scheduled time to have this looked at.

## 2012-06-20 NOTE — Telephone Encounter (Signed)
Spoke dierectly to pt passed on concern and condolence

## 2012-06-22 ENCOUNTER — Ambulatory Visit (INDEPENDENT_AMBULATORY_CARE_PROVIDER_SITE_OTHER): Payer: Medicare HMO | Admitting: Family Medicine

## 2012-06-22 ENCOUNTER — Encounter: Payer: Self-pay | Admitting: Family Medicine

## 2012-06-22 VITALS — BP 140/68 | HR 83 | Resp 16 | Wt 161.0 lb

## 2012-06-22 DIAGNOSIS — K219 Gastro-esophageal reflux disease without esophagitis: Secondary | ICD-10-CM

## 2012-06-22 DIAGNOSIS — E119 Type 2 diabetes mellitus without complications: Secondary | ICD-10-CM

## 2012-06-22 DIAGNOSIS — I10 Essential (primary) hypertension: Secondary | ICD-10-CM

## 2012-06-22 DIAGNOSIS — E785 Hyperlipidemia, unspecified: Secondary | ICD-10-CM

## 2012-06-22 DIAGNOSIS — B372 Candidiasis of skin and nail: Secondary | ICD-10-CM

## 2012-06-22 MED ORDER — NYSTATIN 100000 UNIT/GM EX POWD: CUTANEOUS | Status: DC

## 2012-06-22 MED ORDER — CLONIDINE HCL 0.1 MG PO TABS: ORAL_TABLET | ORAL | Status: DC

## 2012-06-22 NOTE — Patient Instructions (Addendum)
Annual wellness in 4 month  Contnue to use corn starch daily , also use nystatin powder at bedtime once daily as needed  Rash has improved a lot

## 2012-06-25 NOTE — Assessment & Plan Note (Signed)
Hyperlipidemia:Low fat diet discussed and encouraged.  Controlled, no change in medication   

## 2012-06-25 NOTE — Assessment & Plan Note (Signed)
Controlled, no change in medication  

## 2012-06-25 NOTE — Assessment & Plan Note (Signed)
Followed by endo, fair control

## 2012-06-25 NOTE — Assessment & Plan Note (Signed)
Marked improvement in rash pt to complete course of treatment, advised her of the importance of keeping area clean and dry

## 2012-06-25 NOTE — Progress Notes (Signed)
  Subjective:    Patient ID: Christine Dominguez, female    DOB: November 05, 1950, 62 y.o.   MRN: [id number]  HPI The PT is here for follow up and re-evaluation of chronic medical conditions,specifically skin infection across lower abdomen for which she is currently being treated. Rash is 90% better  Preventive health is updated, specifically  Cancer screening and Immunization.    The PT denies any adverse reactions to current medications since the last visit.  Since I last saw her , she lost her husband, he had been chronically il, she is at peace with this, however , mention is made on several occasions about financial challenges she is experiencing Reports good blood sugar control overall, with no significant hypoglycemia or hyper glycemia     Review of Systems See HPI Denies recent fever or chills. Denies sinus pressure, nasal congestion, ear pain or sore throat. Denies chest congestion, productive cough or wheezing. Denies chest pains, palpitations and leg swelling Denies abdominal pain, nausea, vomiting,diarrhea or constipation.   Denies dysuria, frequency, hesitancy or incontinence. Denies joint pain, swelling and limitation in mobility. Denies headaches, seizures, numbness, or tingling.         Objective:   Physical Exam Patient alert and oriented and in no cardiopulmonary distress.  HEENT: No facial asymmetry, EOMI, no sinus tenderness,  oropharynx pink and moist.  Neck supple no adenopathy.  Chest: Clear to auscultation bilaterally.  CVS: S1, S2 no murmurs, no S3.  ABD: Soft non tender. Bowel sounds normal.  Ext: No edema  MS: Adequate ROM spine, shoulders, hips and knees.  Skin: healed dermatomycosis, and fungal infection of lower abdominal fold  Psych: Good eye contact, normal affect. Memory intact not anxious or depressed appearing.  CNS: CN 2-12 intact, power, tone and sensation normal throughout.        Assessment & Plan:

## 2012-06-25 NOTE — Assessment & Plan Note (Signed)
Controlled, no change in medication DASH diet and commitment to daily physical activity for a minimum of 30 minutes discussed and encouraged, as a part of hypertension management. The importance of attaining a healthy weight is also discussed.  

## 2012-07-03 ENCOUNTER — Ambulatory Visit: Payer: Medicare HMO | Admitting: Family Medicine

## 2012-07-06 ENCOUNTER — Telehealth: Payer: Self-pay | Admitting: Adult Health

## 2012-07-06 MED ORDER — CLONIDINE HCL 0.1 MG PO TABS: ORAL_TABLET | ORAL | Status: DC

## 2012-07-06 NOTE — Telephone Encounter (Signed)
Needs clonidine refill sent to [facility]

## 2012-07-06 NOTE — Telephone Encounter (Signed)
Noted pt up to date on apt, upcoming apt with [facility] noted on 08-01-12 sent refill to last until apt via escribe

## 2012-07-07 ENCOUNTER — Telehealth: Payer: Self-pay | Admitting: Adult Health

## 2012-07-07 MED ORDER — CLONIDINE HCL 0.1 MG PO TABS: 0.1000 mg | ORAL_TABLET | Freq: Two times a day (BID) | ORAL | Status: DC

## 2012-07-07 NOTE — Telephone Encounter (Signed)
Patient states Clonidine was called in for dosage of once a day but states that it was changed last year to twice a day. / tgs

## 2012-07-07 NOTE — Telephone Encounter (Signed)
Called pharmacy to give a verbal orders for pt clonidine to be changed to twice daily per RX called in for one a day, spoke to rep Joey who confirmed verbal order to give pt 180 with 1 refill, called pt to advise the refill had been called in, pt understood

## 2012-07-18 ENCOUNTER — Other Ambulatory Visit: Payer: Self-pay

## 2012-07-18 MED ORDER — GABAPENTIN 100 MG PO CAPS: ORAL_CAPSULE | ORAL | Status: DC

## 2012-07-19 ENCOUNTER — Other Ambulatory Visit: Payer: Self-pay | Admitting: Emergency Medicine

## 2012-07-19 MED ORDER — VALSARTAN 320 MG PO TABS: 320.0000 mg | ORAL_TABLET | Freq: Every day | ORAL | Status: DC

## 2012-08-01 ENCOUNTER — Encounter: Payer: Self-pay | Admitting: Adult Health

## 2012-08-01 ENCOUNTER — Other Ambulatory Visit: Payer: Self-pay | Admitting: Cardiology

## 2012-08-01 ENCOUNTER — Ambulatory Visit: Payer: Medicare HMO | Admitting: Adult Health

## 2012-08-01 ENCOUNTER — Ambulatory Visit (INDEPENDENT_AMBULATORY_CARE_PROVIDER_SITE_OTHER): Payer: Medicare HMO | Admitting: Adult Health

## 2012-08-01 VITALS — BP 150/80 | HR 87 | Ht 61.0 in | Wt 170.0 lb

## 2012-08-01 DIAGNOSIS — I10 Essential (primary) hypertension: Secondary | ICD-10-CM

## 2012-08-01 DIAGNOSIS — E785 Hyperlipidemia, unspecified: Secondary | ICD-10-CM

## 2012-08-01 DIAGNOSIS — R12 Heartburn: Secondary | ICD-10-CM

## 2012-08-01 DIAGNOSIS — K219 Gastro-esophageal reflux disease without esophagitis: Secondary | ICD-10-CM

## 2012-08-01 MED ORDER — SUCRALFATE 1 G PO TABS: 1.0000 g | ORAL_TABLET | Freq: Two times a day (BID) | ORAL | Status: DC

## 2012-08-01 MED ORDER — HYDROCHLOROTHIAZIDE 12.5 MG PO CAPS: 25.0000 mg | ORAL_CAPSULE | Freq: Every day | ORAL | Status: DC

## 2012-08-01 MED ORDER — OMEPRAZOLE 20 MG PO CPDR: 40.0000 mg | DELAYED_RELEASE_CAPSULE | Freq: Two times a day (BID) | ORAL | Status: DC

## 2012-08-01 NOTE — Assessment & Plan Note (Signed)
Check fasting lipids on next blood draw

## 2012-08-01 NOTE — Assessment & Plan Note (Signed)
Will increase prilosec to 40 mg daily. Add carafate as well. IF symptoms persist refer to GI. Check H-pylori

## 2012-08-01 NOTE — Assessment & Plan Note (Signed)
Will increase HCTZ to 25 mg daily as BP is not well controlled.  Follow up BMET in 2 weeks. See her again in 6 months,

## 2012-08-01 NOTE — Addendum Note (Signed)
Addended by: Linzie Collin D on: 08/01/2012 03:38 PM   Modules accepted: Orders

## 2012-08-01 NOTE — Progress Notes (Signed)
HPI this is a 62 year old patient of Dr. Molly Maduro R. were following for ongoing assessment and management of difficult to control hypertension. The patient was last seen by Wynell Balloon PA on 02/03/2012. The patient has a history of this surgery and radiation on her right ear which resulted in loss of taste and appetite and a 45 pound weight loss. In the past she salted her food to make a taste better. Last visit with Ms. Suezanne Cheshire she was normotensive and was without complaint. No changes were made in medication regimen.   Recently lost her husband to death one month ago. She is under some stress associated with this.She denies chest pain or dyspnea.  Allergies  Allergen Reactions  . Amaryl   . Aspirin     Burning in stomach, nausea  . Avandia (Rosiglitazone Maleate)   . Glimepiride   . Glipizide     Current Outpatient Prescriptions  Medication Sig Dispense Refill  . ACCU-CHEK AVIVA PLUS test strip USE AS DIRECTED TO CHECK BLOOD SUGAR THREE TIMES DAILY AS DIRECTED.  300 each  3  . Alcohol Swabs (B-D SINGLE USE SWABS REGULAR) PADS Use as needed daily with insulin injections  300 each  1  . aspirin EC 81 MG tablet Take 81 mg by mouth at bedtime.      . cloNIDine (CATAPRES) 0.1 MG tablet Take 0.1 mg by mouth 2 (two) times daily.      . clotrimazole-betamethasone (LOTRISONE) cream Apply twice daily to rash on lower abdomen for 10 days, then , as needed  45 g  2  . cyclobenzaprine (FLEXERIL) 10 MG tablet Take 10 mg by mouth at bedtime as needed.      . docusate sodium (COLACE) 100 MG capsule Take 100 mg by mouth every other day.       . gabapentin (NEURONTIN) 100 MG capsule TAKE 1 CAPSULE TWICE DAILY  180 capsule  1  . hydrochlorothiazide (MICROZIDE) 12.5 MG capsule Take 1 capsule (12.5 mg total) by mouth as needed.  90 capsule  3  . Ibuprofen 200 MG CAPS Take by mouth. As needed      . insulin glargine (LANTUS SOLOSTAR) 100 UNIT/ML injection Inject 25 Units into the skin at bedtime.       . Iron  66 MG TABS Take 1 tablet by mouth 2 (two) times daily.       Marland Kitchen levothyroxine (SYNTHROID, LEVOTHROID) 50 MCG tablet Take 50 mcg by mouth daily before breakfast.      . linagliptin (TRADJENTA) 5 MG TABS tablet Take 5 mg by mouth daily.      Marland Kitchen nystatin (MYCOSTATIN) powder Apply once daily at bedtime to rash on lower abdomen, for 1 week, then, as needed  60 g  0  . omeprazole (PRILOSEC) 20 MG capsule Take 1 capsule (20 mg total) by mouth 2 (two) times daily.  180 capsule  3  . simvastatin (ZOCOR) 20 MG tablet Take 20 mg by mouth at bedtime.        . valsartan (DIOVAN) 320 MG tablet Take 1 tablet (320 mg total) by mouth daily.  90 tablet  1   No current facility-administered medications for this visit.    Past Medical History  Diagnosis Date  . Chronic back pain 2007    disabled   . Diabetes mellitus 1991  . Hypertension 1980  . Obesity   . Hydrocephalus     treated with VP shunt and then intracranial tumor resection (?Cholesteatoma); no longer followed  actively by neurosurgery or otolaryngology  . Cancer 1998, recurred in 2012    paraganglioma of jugular tympamicum, recurrent in 2012    Past Surgical History  Procedure Laterality Date  . Ventriculoperitoneal shunt  1998    at [facility]  . Intracranial mass resected  1998    at [facility]; ? location-right middle ear  . Hemorrhoidectomy    . Trigger finger release      right thumb  . Tubal ligation  1980  . Colonoscopy  06/2010    ROS: Review of systems complete and found to be negative unless listed above PHYSICAL EXAM BP 150/80  Pulse 87  Ht 5\' 1"  (1.549 m)  Wt 170 lb (77.111 kg)  BMI 32.14 kg/m2  General: Well developed, well nourished, in no acute distress Head: Eyes PERRLA, No xanthomas.   Normal cephalic and atramatic  Lungs: Clear bilaterally to auscultation and percussion. Heart: HRRR S1 S2, without MRG.  Pulses are 2+ & equal.            No carotid bruit. No JVD.  No abdominal bruits. No femoral bruits. Abdomen: Bowel sounds  are positive, abdomen soft and non-tender without masses or                  Hernia's noted. Msk:  Back normal, normal gait. Normal strength and tone for age. Extremities: No clubbing, cyanosis or edema.  DP +1 Neuro: Alert and oriented X 3. Psych:  Good affect, responds appropriately  EKG:NSR rate of 87 bpm.   ASSESSMENT AND PLAN

## 2012-08-01 NOTE — Patient Instructions (Addendum)
Your physician has recommended you make the following change in your medication:   1. Increase HCTZ to 25 mg daily. 2. Increase Prilosec to 40 mg twice daily 3. Start Carafate 1 gm twice daily  Your physician recommends that you return for lab work in: 1 week fasting, Lipids and LFT's, BMET  Your physician wants you to follow-up in: 6 months with Tenn[facility] Must. You will receive a reminder letter in the mail two months in advance. If you don't receive a letter, please call our office to schedule the follow-up appointment.

## 2012-08-07 LAB — BASIC METABOLIC PANEL
BUN: 23 mg/dL (ref 6–23)
CO2: 29 mEq/L (ref 19–32)
Calcium: 9.8 mg/dL (ref 8.4–10.5)
Creat: 1.52 mg/dL — ABNORMAL HIGH (ref 0.50–1.10)

## 2012-08-07 LAB — LIPID PANEL
HDL: 41 mg/dL (ref >39)
Triglycerides: 138 mg/dL (ref <150)

## 2012-08-07 LAB — HEPATIC FUNCTION PANEL
AST: 12 U/L (ref 0–37)
Albumin: 3.9 g/dL (ref 3.5–5.2)
Alkaline Phosphatase: 73 U/L (ref 39–117)
Total Bilirubin: 0.3 mg/dL (ref 0.3–1.2)

## 2012-08-08 ENCOUNTER — Encounter: Payer: Self-pay | Admitting: *Deleted

## 2012-08-18 ENCOUNTER — Telehealth: Payer: Self-pay | Admitting: *Deleted

## 2012-08-18 MED ORDER — OMEPRAZOLE 20 MG PO CPDR: 40.0000 mg | DELAYED_RELEASE_CAPSULE | Freq: Two times a day (BID) | ORAL | Status: DC

## 2012-08-18 NOTE — Telephone Encounter (Signed)
Needs refill for Omeprazole 40mg  sent to [facility] / tgs

## 2012-08-18 NOTE — Telephone Encounter (Signed)
rx sent to pharmacy by e-script  

## 2012-08-23 ENCOUNTER — Telehealth: Payer: Self-pay | Admitting: Adult Health

## 2012-08-23 NOTE — Telephone Encounter (Signed)
Called [facility] and spoke to [place], was transferred to [facility] for the prior authorization, spoke to [place], notes pt takes 2 of the 20mg  tablets BID, advised that the prior authorization was submitted and the resolution will be faxed in 24-72 hours

## 2012-08-23 NOTE — Telephone Encounter (Signed)
Patient is calling to tell us she received a phone call from [facility] telling her they received the script refill on her Omeprazole but they need more information from [place]. Patient tells me [facility] faxed some information to [place], but they haven't received it yet.  Patient is at:  [phone].

## 2012-08-29 ENCOUNTER — Telehealth: Payer: Self-pay | Admitting: *Deleted

## 2012-08-29 NOTE — Telephone Encounter (Signed)
PT NEEDS OMEPRAZOL CALLED IN

## 2012-08-30 MED ORDER — OMEPRAZOLE 20 MG PO CPDR: 40.0000 mg | DELAYED_RELEASE_CAPSULE | Freq: Two times a day (BID) | ORAL | Status: DC

## 2012-08-30 NOTE — Telephone Encounter (Signed)
Pt advised that [facility] will file with her other insurance, per [facility] filed with [employer],please send to [facility], sent via escribe, pt will call back if any further assistance is needed

## 2012-08-30 NOTE — Telephone Encounter (Signed)
Pt advised that [facility] will file with her other insurance, per [facility] filed with [employer],please send to [facility], sent via escribe, pt will call back if any further assistance is needed  

## 2012-09-07 ENCOUNTER — Other Ambulatory Visit: Payer: Self-pay | Admitting: Family Medicine

## 2012-09-07 DIAGNOSIS — Z139 Encounter for screening, unspecified: Secondary | ICD-10-CM

## 2012-09-12 ENCOUNTER — Ambulatory Visit (HOSPITAL_COMMUNITY)
Admission: RE | Admit: 2012-09-12 | Discharge: 2012-09-12 | Disposition: A | Discharge status: Home / Self Care | Payer: Medicare HMO | Source: Ambulatory Visit | Attending: Family Medicine | Admitting: Family Medicine

## 2012-09-12 ENCOUNTER — Ambulatory Visit: Payer: Medicare HMO | Admitting: Family Medicine

## 2012-09-12 DIAGNOSIS — Z139 Encounter for screening, unspecified: Secondary | ICD-10-CM

## 2012-09-12 DIAGNOSIS — Z1231 Encounter for screening mammogram for malignant neoplasm of breast: Secondary | ICD-10-CM | POA: Insufficient documentation

## 2012-09-26 ENCOUNTER — Telehealth: Payer: Self-pay | Admitting: *Deleted

## 2012-09-26 NOTE — Telephone Encounter (Signed)
PT IS USING [facility] [phone].omeprazole (PRILOSEC) 20 MG capsule NEEDS PRE-AUTHERIZATION FOR NEXT REFILL. SHE CAN GET IT CHEAPER IF WE ORDER IT FOR 40 MG # 120.

## 2012-09-28 MED ORDER — OMEPRAZOLE 40 MG PO CPDR: 40.0000 mg | DELAYED_RELEASE_CAPSULE | Freq: Two times a day (BID) | ORAL | Status: DC

## 2012-09-28 NOTE — Telephone Encounter (Signed)
Medication sent via escribe.  

## 2012-10-10 ENCOUNTER — Telehealth: Payer: Self-pay | Admitting: Family Medicine

## 2012-10-10 NOTE — Telephone Encounter (Signed)
Please advise 

## 2012-10-10 NOTE — Telephone Encounter (Signed)
Letter written, pls type

## 2012-10-11 NOTE — Telephone Encounter (Signed)
Faxed to number provided

## 2012-10-23 ENCOUNTER — Telehealth: Payer: Self-pay | Admitting: Family Medicine

## 2012-10-23 ENCOUNTER — Ambulatory Visit (INDEPENDENT_AMBULATORY_CARE_PROVIDER_SITE_OTHER): Payer: Medicare HMO | Admitting: Family Medicine

## 2012-10-23 ENCOUNTER — Encounter: Payer: Self-pay | Admitting: Family Medicine

## 2012-10-23 VITALS — BP 130/78 | HR 82 | Resp 16 | Ht 61.5 in | Wt 166.8 lb

## 2012-10-23 DIAGNOSIS — E785 Hyperlipidemia, unspecified: Secondary | ICD-10-CM

## 2012-10-23 DIAGNOSIS — C755 Malignant neoplasm of aortic body and other paraganglia: Secondary | ICD-10-CM

## 2012-10-23 DIAGNOSIS — E039 Hypothyroidism, unspecified: Secondary | ICD-10-CM

## 2012-10-23 DIAGNOSIS — I10 Essential (primary) hypertension: Secondary | ICD-10-CM

## 2012-10-23 DIAGNOSIS — E119 Type 2 diabetes mellitus without complications: Secondary | ICD-10-CM

## 2012-10-23 NOTE — Progress Notes (Signed)
  Subjective:    Patient ID: Christine Dominguez, female    DOB: 1950-10-06, 62 y.o.   MRN: [id number]  HPI  The PT is here for follow up and re-evaluation of chronic medical conditions, medication management and review of any available recent lab and radiology data.  Preventive health is updated, specifically  Cancer screening and Immunization.   Diabetes is improved, she is followed by endocrinology, and she sees cardiology on anointermittent basis The PT denies any adverse reactions to current medications since the last visit.  There are no new concerns.  There are no specific complaints                   Review of Systems  See HPI   Denies recent fever or chills. Denies sinus pressure, nasal congestion, ear pain or sore throat. Denies chest congestion, productive cough or wheezing. Denies chest pains, palpitations and leg swelling Denies abdominal pain, nausea, vomiting,diarrhea or constipation.   Denies dysuria, frequency, hesitancy or incontinence. Chronic limitation in mobility, requests handicap sticker renewal. Denies headaches, seizures, numbness, or tingling. Denies depression, anxiety or insomnia. Denies skin break down or rash.         Objective:   Physical Exam Patient alert and oriented and in no cardiopulmonary distress.  HEENT: No facial asymmetry, EOMI, no sinus tenderness,  oropharynx pink and moist.  Neck supple no adenopathy.  Chest: Clear to auscultation bilaterally.  CVS: S1, S2 no murmurs, no S3.  ABD: Soft non tender. Bowel sounds normal.  Ext: No edema  MS: Adequate though reduced  ROM spine, shoulders, hips and knees.  Skin: Intact, no ulcerations or rash noted.  Psych: Good eye contact, normal affect. Memory intact not anxious or depressed appearing.  CNS: CN 2-12 intact, power, tone and sensation normal throughout.        Assessment & Plan:

## 2012-10-23 NOTE — Telephone Encounter (Signed)
rx sent

## 2012-10-26 ENCOUNTER — Telehealth: Payer: Self-pay | Admitting: Cardiology

## 2012-10-26 MED ORDER — CLONIDINE HCL 0.1 MG PO TABS: 0.1000 mg | ORAL_TABLET | Freq: Two times a day (BID) | ORAL | Status: DC

## 2012-10-26 NOTE — Telephone Encounter (Signed)
Please call in refill on Clonidine to [facility]

## 2012-10-29 NOTE — Assessment & Plan Note (Signed)
Controlled, no change in medication DASH diet and commitment to daily physical activity for a minimum of 30 minutes discussed and encouraged, as a part of hypertension management. The importance of attaining a healthy weight is also discussed.  

## 2012-10-29 NOTE — Patient Instructions (Addendum)
F/u in 4.5 month, call if you need me before  Flu vaccine will be available in October  No changes in meidcations.  Pls schedule your eye exam as we discussed

## 2012-10-29 NOTE — Assessment & Plan Note (Signed)
Improved control and fololwed by endo. Microalb and foot exam in office today Patient advised to reduce carb and sweets, commit to regular physical activity, take meds as prescribed, test blood as directed, and attempt to lose weight, to improve blood sugar control.

## 2012-10-29 NOTE — Assessment & Plan Note (Signed)
Controlled, no change in medication Hyperlipidemia:Low fat diet discussed and encouraged.  \ 

## 2012-10-29 NOTE — Assessment & Plan Note (Signed)
Treated by endo, asymptomatic and controlled

## 2012-10-29 NOTE — Assessment & Plan Note (Signed)
excellent response to re- treatment followed at [facility]

## 2012-11-01 ENCOUNTER — Telehealth: Payer: Self-pay | Admitting: Cardiology

## 2012-11-01 NOTE — Telephone Encounter (Signed)
Please send RX for Diovan 30 day supply to [facility] / [facility]

## 2012-11-02 ENCOUNTER — Telehealth: Payer: Self-pay | Admitting: *Deleted

## 2012-11-02 ENCOUNTER — Telehealth: Payer: Self-pay

## 2012-11-02 ENCOUNTER — Other Ambulatory Visit: Payer: Self-pay

## 2012-11-02 MED ORDER — VALSARTAN 320 MG PO TABS: 320.0000 mg | ORAL_TABLET | Freq: Every day | ORAL | Status: DC

## 2012-11-02 MED ORDER — OMEPRAZOLE 40 MG PO CPDR: 40.0000 mg | DELAYED_RELEASE_CAPSULE | Freq: Every day | ORAL | Status: DC

## 2012-11-02 NOTE — Telephone Encounter (Signed)
Pt needs diovan called in ASAP she has been out for a couple days. She needs it called in to [facility] for 30 day. Please call her when it has been called in.

## 2012-11-02 NOTE — Telephone Encounter (Signed)
Insurance will not pay for her omeprazole 40mg  for twice daily so she requested that I send in for once daily and send a 30 day supply to see if she could afford it. The twice daily cost was $25. Med sent

## 2012-11-02 NOTE — Telephone Encounter (Signed)
Called and made pt aware, Medication sent via escribe.

## 2012-12-22 ENCOUNTER — Ambulatory Visit: Payer: Medicare HMO

## 2013-01-02 ENCOUNTER — Other Ambulatory Visit (HOSPITAL_COMMUNITY)
Admission: RE | Admit: 2013-01-02 | Discharge: 2013-01-02 | Disposition: A | Discharge status: Home / Self Care | Payer: Medicare HMO | Source: Ambulatory Visit | Attending: Adult Health | Admitting: Adult Health

## 2013-01-02 ENCOUNTER — Ambulatory Visit (INDEPENDENT_AMBULATORY_CARE_PROVIDER_SITE_OTHER): Payer: Medicare HMO | Admitting: Adult Health

## 2013-01-02 ENCOUNTER — Encounter: Payer: Self-pay | Admitting: Adult Health

## 2013-01-02 VITALS — BP 140/72 | HR 72 | Ht 61.5 in | Wt 163.0 lb

## 2013-01-02 DIAGNOSIS — Z0000 Encounter for general adult medical examination without abnormal findings: Secondary | ICD-10-CM

## 2013-01-02 DIAGNOSIS — Z1212 Encounter for screening for malignant neoplasm of rectum: Secondary | ICD-10-CM

## 2013-01-02 DIAGNOSIS — Z1151 Encounter for screening for human papillomavirus (HPV): Secondary | ICD-10-CM | POA: Insufficient documentation

## 2013-01-02 DIAGNOSIS — Z01419 Encounter for gynecological examination (general) (routine) without abnormal findings: Secondary | ICD-10-CM

## 2013-01-02 LAB — HEMOCCULT GUIAC POC 1CARD (OFFICE): Fecal Occult Blood, POC: NEGATIVE

## 2013-01-02 NOTE — Patient Instructions (Addendum)
Physical in 1 year Mammogram yearly Labs with PCP Colonoscopy as per Dr Jena Gauss Get flu shot

## 2013-01-02 NOTE — Progress Notes (Signed)
Patient ID: Christine Dominguez, female   DOB: 05/10/1950, 62 y.o.   MRN: [id number] History of Present Illness: Christine Dominguez is a 62 year old black female recently widowed in for a pap and physical.   Current Medications, Allergies, Past Medical History, Past Surgical History, Family History and Social History were reviewed in [employer] electronic medical record.   Past Medical History  Diagnosis Date  . Chronic back pain 2007    disabled   . Obesity   . Hydrocephalus     treated with VP shunt and then intracranial tumor resection (?Cholesteatoma); no longer followed actively by neurosurgery or otolaryngology  . 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 Diabetes mellitus 1991  . Hypertension 1980  . GERD (gastroesophageal reflux disease) 2000  . Thyroid disease 2013    hypothyroid  . Hyperlipidemia 2013  . Arthritis     cervical spine  Current outpatient prescriptions:ACCU-CHEK AVIVA PLUS test strip, USE AS DIRECTED TO CHECK BLOOD SUGAR THREE TIMES DAILY AS DIRECTED., Disp: 300 each, Rfl: 3;  ACCU-CHEK FASTCLIX LANCETS MISC, , Disp: , Rfl: ;  Alcohol Swabs (B-D SINGLE USE SWABS REGULAR) PADS, Use as needed daily with insulin injections, Disp: 300 each, Rfl: 1;  aspirin EC 81 MG tablet, Take 81 mg by mouth at bedtime., Disp: , Rfl:  Blood Glucose Monitoring Suppl (ACCU-CHEK NANO SMARTVIEW) W/DEVICE KIT, , Disp: , Rfl: ;  cloNIDine (CATAPRES) 0.1 MG tablet, Take 1 tablet (0.1 mg total) by mouth 2 (two) times daily., Disp: 60 tablet, Rfl: 6;  docusate sodium (COLACE) 100 MG capsule, Take 100 mg by mouth every other day. , Disp: , Rfl: ;  gabapentin (NEURONTIN) 100 MG capsule, TAKE 1 CAPSULE TWICE DAILY, Disp: 180 capsule, Rfl: 1 hydrochlorothiazide (MICROZIDE) 12.5 MG capsule, Take 2 capsules (25 mg total) by mouth daily., Disp: 60 capsule, Rfl: 3;  Ibuprofen 200 MG CAPS, Take by mouth. As needed, Disp: , Rfl: ;  insulin glargine (LANTUS  SOLOSTAR) 100 UNIT/ML injection, Inject 25 Units into the skin at bedtime. , Disp: , Rfl: ;  Iron 66 MG TABS, Take 1 tablet by mouth 2 (two) times daily. , Disp: , Rfl:  levothyroxine (SYNTHROID, LEVOTHROID) 50 MCG tablet, Take 50 mcg by mouth daily before breakfast., Disp: , Rfl: ;  linagliptin (TRADJENTA) 5 MG TABS tablet, Take 5 mg by mouth daily., Disp: , Rfl: ;  nystatin (MYCOSTATIN) powder, Apply once daily at bedtime to rash on lower abdomen, for 1 week, then, as needed, Disp: 60 g, Rfl: 0;  omeprazole (PRILOSEC) 40 MG capsule, Take 1 capsule (40 mg total) by mouth daily., Disp: 30 capsule, Rfl: 0 simvastatin (ZOCOR) 20 MG tablet, Take 20 mg by mouth at bedtime.  , Disp: , Rfl: ;  valsartan (DIOVAN) 320 MG tablet, Take 1 tablet (320 mg total) by mouth daily., Disp: 90 tablet, Rfl: 1  Review of Systems: Patient denies any headaches, blurred vision, shortness of breath, chest pain, abdominal pain, problems with bowel movements, urination, or intercourse-not having sex,has some back pains, no mood changes.    Physical Exam:BP 140/72  Pulse 72  Ht 5' 1.5" (1.562 m)  Wt 163 lb (73.936 kg)  BMI 30.3 kg/m2 General:  Well developed, well nourished, no acute distress Skin:  Warm and dry Neck:  Midline trachea, normal thyroid, no carotid bruits Lungs; Clear to auscultation bilaterally Breast:  No dominant palpable mass, retraction, or nipple discharge Cardiovascular: Regular rate and  rhythm Abdomen:  Soft, non tender, no hepatosplenomegaly Pelvic:  External genitalia is normal in appearance.  The vagina is normal in appearance for age.               The cervix is atrophic.Pap with HPV, had some bleeding after pap  Uterus is felt to be normal size, shape, and contour.  No   adnexal masses or tenderness noted. Rectal: Good sphincter tone, no polyps, or hemorrhoids felt.  Hemoccult negative. Extremities:  No swelling or varicosities noted Psych:  No mood changes alert and cooperative, seems happy  and busy in church   Impression: Yearly gyn exam    Plan: Physical in 1 year Mammogram yearly  Colonoscopy as per Dr Linward Natal with PCP Get flu shot

## 2013-01-22 ENCOUNTER — Other Ambulatory Visit: Payer: Self-pay | Admitting: Adult Health

## 2013-02-01 ENCOUNTER — Ambulatory Visit (INDEPENDENT_AMBULATORY_CARE_PROVIDER_SITE_OTHER): Payer: Medicare HMO | Admitting: Adult Health

## 2013-02-01 ENCOUNTER — Encounter: Payer: Self-pay | Admitting: Adult Health

## 2013-02-01 VITALS — BP 184/92 | HR 85 | Ht 61.0 in | Wt 164.0 lb

## 2013-02-01 DIAGNOSIS — I10 Essential (primary) hypertension: Secondary | ICD-10-CM

## 2013-02-01 DIAGNOSIS — E109 Type 1 diabetes mellitus without complications: Secondary | ICD-10-CM

## 2013-02-01 NOTE — Patient Instructions (Signed)
Your physician recommends that you schedule a follow-up appointment in: 6 months  

## 2013-02-01 NOTE — Assessment & Plan Note (Signed)
She is hypertensive today, but has White Coat Syndrome. Earlier BP's are WNL. She is medically complaint, is keeping her wt down and increasing her exercise. She denies symptoms of chest pain or DOE.  Will continue her on current medications She will be seen in 6 months unless symptomatic.

## 2013-02-01 NOTE — Progress Notes (Signed)
HPI this is a 61 year old patient formerly of Dr. Dietrich Pates we are following for ongoing assessment and management of difficult to control hypertension. The patient was last seen in the office in April of 2014, was found to be very stressed after recent loss of her husband to death 1 month earlier. Blood pressure was mildly elevated at 150/80. HCTZ was increased to 25 mg daily. Due to recurrent GERD symptoms the patient's Prilosec was increased to 40 mg daily with addition of Carafate.    She is without complaint. She is slightly hypertensive today, but has a history of White Coat Syndrome.   At home BP 128/68.  She is medically compliant.  Allergies  Allergen Reactions  . Amaryl   . Aspirin     Burning in stomach, nausea  . Avandia [Rosiglitazone Maleate]   . Glimepiride   . Glipizide   . Novolog [Insulin Aspart]     Current Outpatient Prescriptions  Medication Sig Dispense Refill  . ACCU-CHEK AVIVA PLUS test strip USE AS DIRECTED TO CHECK BLOOD SUGAR THREE TIMES DAILY AS DIRECTED.  300 each  3  . ACCU-CHEK FASTCLIX LANCETS MISC       . Alcohol Swabs (B-D SINGLE USE SWABS REGULAR) PADS Use as needed daily with insulin injections  300 each  1  . aspirin EC 81 MG tablet Take 81 mg by mouth at bedtime.      . Blood Glucose Monitoring Suppl (ACCU-CHEK NANO SMARTVIEW) W/DEVICE KIT       . cloNIDine (CATAPRES) 0.1 MG tablet Take 1 tablet (0.1 mg total) by mouth 2 (two) times daily.  60 tablet  6  . docusate sodium (COLACE) 100 MG capsule Take 100 mg by mouth every other day.       . gabapentin (NEURONTIN) 100 MG capsule TAKE 1 CAPSULE TWICE DAILY  180 capsule  1  . hydrochlorothiazide (HYDRODIURIL) 25 MG tablet TAKE 1 TABLET BY MOUTH ONCE A DAY.  30 tablet  1  . Ibuprofen 200 MG CAPS Take by mouth. As needed      . insulin glargine (LANTUS SOLOSTAR) 100 UNIT/ML injection Inject 25 Units into the skin at bedtime.       . Iron 66 MG TABS Take 1 tablet by mouth 2 (two) times daily.       Marland Kitchen  levothyroxine (SYNTHROID, LEVOTHROID) 50 MCG tablet Take 50 mcg by mouth daily before breakfast.      . linagliptin (TRADJENTA) 5 MG TABS tablet Take 5 mg by mouth daily.      Marland Kitchen nystatin (MYCOSTATIN) powder Apply once daily at bedtime to rash on lower abdomen, for 1 week, then, as needed  60 g  0  . omeprazole (PRILOSEC) 40 MG capsule Take 1 capsule (40 mg total) by mouth daily.  30 capsule  0  . simvastatin (ZOCOR) 20 MG tablet Take 20 mg by mouth at bedtime.        . valsartan (DIOVAN) 320 MG tablet Take 1 tablet (320 mg total) by mouth daily.  90 tablet  1   No current facility-administered medications for this visit.    Past Medical History  Diagnosis Date  . Chronic back pain 2007    disabled   . Obesity   . Hydrocephalus     treated with VP shunt and then intracranial tumor resection (?Cholesteatoma); no longer followed actively by neurosurgery or otolaryngology  . 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  Diabetes mellitus 1991  . Hypertension 1980  . GERD (gastroesophageal reflux disease) 2000  . Thyroid disease 2013    hypothyroid  . Hyperlipidemia 2013  . Arthritis     cervical spine    Past Surgical History  Procedure Laterality Date  . Ventriculoperitoneal shunt  1998    at [place]  . Intracranial mass resected  1998    at [facility]; ? location-right middle ear  . Hemorrhoidectomy    . Trigger finger release      right thumb  . Tubal ligation  1980  . Colonoscopy  06/2010  . Tubal ligation      GNF:AOZHYQ of systems complete and found to be negative unless listed above  PHYSICAL EXAM BP 184/92  Pulse 85  Ht 5\' 1"  (1.549 m)  Wt 164 lb (74.39 kg)  BMI 31 kg/m2  General: Well developed, well nourished, in no acute distress Head: Eyes PERRLA, No xanthomas.   Normal cephalic and atramatic  Lungs: Clear bilaterally to auscultation and percussion. Heart: HRRR S1 S2, without MRG.  Pulses are 2+ & equal.            No  carotid bruit. No JVD.  No abdominal bruits. No femoral bruits. Abdomen: Bowel sounds are positive, abdomen soft and non-tender without masses or                  Hernia's noted. Msk:  Back normal, normal gait. Normal strength and tone for age. Extremities: No clubbing, cyanosis or edema.  DP +1 Neuro: Alert and oriented X 3. Psych:  Good affect, responds appropriately  :  ASSESSMENT AND PLAN

## 2013-02-01 NOTE — Assessment & Plan Note (Signed)
She has recently been established with Dr. Fransico Him for diabetic management through his Endocrinology Service.

## 2013-02-01 NOTE — Progress Notes (Signed)
Name: Christine Dominguez    DOB: [birth year]  Age: 62 y.o.  MR#: [id number]       PCP:  Syliva Overman, MD      Insurance: Payor: [email or web address] MEDICARE / Plan: Francine Graven MEDICARE HMO / Product Type: *No Product type* /   CC:    Chief Complaint  Patient presents with  . Hypertension    VS Filed Vitals:   02/01/13 1320 02/01/13 1322  BP: 157/79 184/92  Pulse: 85   Height: 5\' 1"  (1.549 m)   Weight: 164 lb (74.39 kg)     Weights Current Weight  02/01/13 164 lb (74.39 kg)  01/02/13 163 lb (73.936 kg)  10/23/12 166 lb 12.8 oz (75.66 kg)    Blood Pressure  BP Readings from Last 3 Encounters:  02/01/13 184/92  01/02/13 140/72  10/23/12 130/78     Admit date:  (Not on file) Last encounter with RMR:  01/22/2013   Allergy Amaryl; Aspirin; Avandia; Glimepiride; Glipizide; and Novolog  Current Outpatient Prescriptions  Medication Sig Dispense Refill  . ACCU-CHEK AVIVA PLUS test strip USE AS DIRECTED TO CHECK BLOOD SUGAR THREE TIMES DAILY AS DIRECTED.  300 each  3  . ACCU-CHEK FASTCLIX LANCETS MISC       . Alcohol Swabs (B-D SINGLE USE SWABS REGULAR) PADS Use as needed daily with insulin injections  300 each  1  . aspirin EC 81 MG tablet Take 81 mg by mouth at bedtime.      . Blood Glucose Monitoring Suppl (ACCU-CHEK NANO SMARTVIEW) W/DEVICE KIT       . cloNIDine (CATAPRES) 0.1 MG tablet Take 1 tablet (0.1 mg total) by mouth 2 (two) times daily.  60 tablet  6  . docusate sodium (COLACE) 100 MG capsule Take 100 mg by mouth every other day.       . gabapentin (NEURONTIN) 100 MG capsule TAKE 1 CAPSULE TWICE DAILY  180 capsule  1  . hydrochlorothiazide (HYDRODIURIL) 25 MG tablet TAKE 1 TABLET BY MOUTH ONCE A DAY.  30 tablet  1  . Ibuprofen 200 MG CAPS Take by mouth. As needed      . insulin glargine (LANTUS SOLOSTAR) 100 UNIT/ML injection Inject 25 Units into the skin at bedtime.       . Iron 66 MG TABS Take 1 tablet by mouth 2 (two) times daily.       Marland Kitchen levothyroxine (SYNTHROID,  LEVOTHROID) 50 MCG tablet Take 50 mcg by mouth daily before breakfast.      . linagliptin (TRADJENTA) 5 MG TABS tablet Take 5 mg by mouth daily.      Marland Kitchen nystatin (MYCOSTATIN) powder Apply once daily at bedtime to rash on lower abdomen, for 1 week, then, as needed  60 g  0  . omeprazole (PRILOSEC) 40 MG capsule Take 1 capsule (40 mg total) by mouth daily.  30 capsule  0  . simvastatin (ZOCOR) 20 MG tablet Take 20 mg by mouth at bedtime.        . valsartan (DIOVAN) 320 MG tablet Take 1 tablet (320 mg total) by mouth daily.  90 tablet  1   No current facility-administered medications for this visit.    Discontinued Meds:    Medications Discontinued During This Encounter  Medication Reason  . hydrochlorothiazide (MICROZIDE) 12.5 MG capsule Error    Patient Active Problem List   Diagnosis Date Noted  . Intertriginous candidiasis 06/17/2012  . Routine general medical examination at a health care facility 05/19/2012  .  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 Muscle weakness (generalized) 12/09/2011  . Hypothyroid 11/14/2011  . Hyperlipidemia LDL goal < 130 11/14/2011  . GERD (gastroesophageal reflux disease) 11/03/2011  . Malignant paraganglioma 07/13/2011  . Light-headedness 11/24/2010  . Hearing loss in right ear 11/24/2010  . Diabetes mellitus, insulin dependent (IDDM), controlled 06/11/2010  . BACK PAIN 11/18/2009  . CARPAL TUNNEL SYNDROME 10/19/2009  . HYPERTENSION 10/13/2009    LABS    Component Value Date/Time   NA 139 08/07/2012 0840   NA 141 11/19/2010 0800   NA 139 11/18/2010 1243   K 4.3 08/07/2012 0840   K 4.9 11/19/2010 0800   K 4.4 11/18/2010 1243   CL 103 08/07/2012 0840   CL 105 11/19/2010 0800   CL 102 11/18/2010 1243   CO2 29 08/07/2012 0840   CO2 29 11/19/2010 0800   CO2 28 11/18/2010 1243   GLUCOSE 149* 08/07/2012 0840   GLUCOSE 137* 11/19/2010 0800   GLUCOSE 137* 11/18/2010 1243   BUN 23 08/07/2012 0840   BUN 20 11/19/2010 0800   BUN 23  11/18/2010 1243   CREATININE 1.52* 08/07/2012 0840   CREATININE 1.40* 11/19/2010 0800   CREATININE 1.22* 11/18/2010 1243   CREATININE 1.42* 10/13/2010 0755   CREATININE 1.22* 05/21/2010 2222   CREATININE 1.00 11/19/2009 1924   CALCIUM 9.8 08/07/2012 0840   CALCIUM 9.5 11/19/2010 0800   CALCIUM 10.0 11/18/2010 1243   GFRNONAA 45* 11/18/2010 1243   GFRAA 55* 11/18/2010 1243   CMP     Component Value Date/Time   NA 139 08/07/2012 0840   K 4.3 08/07/2012 0840   CL 103 08/07/2012 0840   CO2 29 08/07/2012 0840   GLUCOSE 149* 08/07/2012 0840   BUN 23 08/07/2012 0840   CREATININE 1.52* 08/07/2012 0840   CREATININE 1.22* 11/18/2010 1243   CALCIUM 9.8 08/07/2012 0840   PROT 8.0 08/07/2012 0840   ALBUMIN 3.9 08/07/2012 0840   AST 12 08/07/2012 0840   ALT <8 08/07/2012 0840   ALKPHOS 73 08/07/2012 0840   BILITOT 0.3 08/07/2012 0840   GFRNONAA 45* 11/18/2010 1243   GFRAA 55* 11/18/2010 1243       Component Value Date/Time   WBC 8.3 11/18/2010 1243   WBC 8.4 03/09/2010 1855   HGB 10.4* 11/18/2010 1243   HGB 11.1* 03/09/2010 1855   HCT 32.9* 11/18/2010 1243   HCT 34.3* 03/09/2010 1855   MCV 83.7 11/18/2010 1243   MCV 83.3 03/09/2010 1855    Lipid Panel     Component Value Date/Time   CHOL 129 08/07/2012 0840   TRIG 138 08/07/2012 0840   HDL 41 08/07/2012 0840   CHOLHDL 3.1 08/07/2012 0840   VLDL 28 08/07/2012 0840   LDLCALC 60 08/07/2012 0840    ABG No results found for this basename: phart, pco2, pco2art, po2, po2art, hco3, tco2, acidbasedef, o2sat     Lab Results  Component Value Date   TSH 2.169 03/09/2010   BNP (last 3 results) No results found for this basename: PROBNP,  in the last 8760 hours Cardiac Panel (last 3 results) No results found for this basename: CKTOTAL, CKMB, TROPONINI, RELINDX,  in the last 72 hours  Iron/TIBC/Ferritin No results found for this basename: iron, tibc, ferritin     EKG Orders placed in visit on 08/01/12  . EKG 12-LEAD     Prior Assessment and Plan Problem List as of 02/01/2013      Cardiovascular and Mediastinum  HYPERTENSION   Last Assessment & Plan   10/23/2012 Office Visit Written 10/29/2012  9:49 PM by Kerri Perches, MD     Controlled, no change in medication DASH diet and commitment to daily physical activity for a minimum of 30 minutes discussed and encouraged, as a part of hypertension management. The importance of attaining a healthy weight is also discussed.       Digestive   GERD (gastroesophageal reflux disease)   Last Assessment & Plan   08/01/2012 Office Visit Written 08/01/2012  3:12 PM by Jodelle Gross, NP     Will increase prilosec to 40 mg daily. Add carafate as well. IF symptoms persist refer to GI. Check H-pylori      Endocrine   Diabetes mellitus, insulin dependent (IDDM), controlled   Last Assessment & Plan   10/23/2012 Office Visit Written 10/29/2012  9:48 PM by Kerri Perches, MD     Improved control and fololwed by endo. Microalb and foot exam in office today Patient advised to reduce carb and sweets, commit to regular physical activity, take meds as prescribed, test blood as directed, and attempt to lose weight, to improve blood sugar control.     Malignant paraganglioma   Last Assessment & Plan   10/23/2012 Office Visit Written 10/29/2012  9:52 PM by Kerri Perches, MD     excellent response to re- treatment followed at [facility]    Hypothyroid   Last Assessment & Plan   10/23/2012 Office Visit Written 10/29/2012  9:50 PM by Kerri Perches, MD     Treated by endo, asymptomatic and controlled      Nervous and Auditory   CARPAL TUNNEL SYNDROME     Musculoskeletal and Integument   Dermatomycosis   Last Assessment & Plan   04/03/2012 Office Visit Written 04/04/2012  5:59 PM by Kerri Perches, MD     Significant yeast and fungal infection across entire lower abdominal wall, will treat aggressively. No sign of bacterial superinfection    Adhesive capsulitis of shoulder     Other   BACK PAIN   Last Assessment & Plan    11/24/2010 Office Visit Written 12/01/2010  6:14 AM by Kerri Perches, MD     improved    Light-headedness   Last Assessment & Plan   11/24/2010 Office Visit Written 12/01/2010  6:15 AM by Kerri Perches, MD     Pt very symptomatic, will have cardiology re-evaluate    Hearing loss in right ear   Last Assessment & Plan   11/24/2010 Office Visit Written 12/01/2010  6:12 AM by Kerri Perches, MD     Evaluation by local eNT, reports lightheadedness, and has had surgery on the ear    Hyperlipidemia LDL goal < 130   Last Assessment & Plan   10/23/2012 Office Visit Written 10/29/2012  9:49 PM by Kerri Perches, MD     Controlled, no change in medication Hyperlipidemia:Low fat diet discussed and encouraged.      Muscle weakness (generalized)   Routine general medical examination at a health care facility   Last Assessment & Plan   04/11/2012 Office Visit Written 05/19/2012  2:11 PM by Kerri Perches, MD     Annual wellness completed as documented. Pt has no restrictions in ability to care for herself, memory or mobility. She has had recurrent malignancy of head and neck area and has recovered extremely well, her positive outlook undoubtedly contributes to her iverall good health  Intertriginous candidiasis   Last Assessment & Plan   06/22/2012 Office Visit Written 06/25/2012  2:56 PM by Kerri Perches, MD     Marked improvement in rash pt to complete course of treatment, advised her of the importance of keeping area clean and dry        Imaging: No results found.

## 2013-02-03 ENCOUNTER — Emergency Department (HOSPITAL_COMMUNITY)
Admission: EM | Admit: 2013-02-03 | Discharge: 2013-02-04 | Disposition: A | Discharge status: Home / Self Care | Payer: Medicare HMO | Attending: Emergency Medicine | Admitting: Emergency Medicine

## 2013-02-03 ENCOUNTER — Other Ambulatory Visit: Payer: Self-pay

## 2013-02-03 ENCOUNTER — Encounter (HOSPITAL_COMMUNITY): Payer: Self-pay | Admitting: Emergency Medicine

## 2013-02-03 DIAGNOSIS — I10 Essential (primary) hypertension: Secondary | ICD-10-CM | POA: Insufficient documentation

## 2013-02-03 DIAGNOSIS — Z8589 Personal history of malignant neoplasm of other organs and systems: Secondary | ICD-10-CM | POA: Insufficient documentation

## 2013-02-03 DIAGNOSIS — E785 Hyperlipidemia, unspecified: Secondary | ICD-10-CM | POA: Insufficient documentation

## 2013-02-03 DIAGNOSIS — E119 Type 2 diabetes mellitus without complications: Secondary | ICD-10-CM | POA: Insufficient documentation

## 2013-02-03 DIAGNOSIS — Z87891 Personal history of nicotine dependence: Secondary | ICD-10-CM | POA: Insufficient documentation

## 2013-02-03 DIAGNOSIS — E079 Disorder of thyroid, unspecified: Secondary | ICD-10-CM | POA: Insufficient documentation

## 2013-02-03 DIAGNOSIS — Z79899 Other long term (current) drug therapy: Secondary | ICD-10-CM | POA: Insufficient documentation

## 2013-02-03 DIAGNOSIS — G8929 Other chronic pain: Secondary | ICD-10-CM | POA: Insufficient documentation

## 2013-02-03 DIAGNOSIS — Z7982 Long term (current) use of aspirin: Secondary | ICD-10-CM | POA: Insufficient documentation

## 2013-02-03 DIAGNOSIS — Z794 Long term (current) use of insulin: Secondary | ICD-10-CM | POA: Insufficient documentation

## 2013-02-03 DIAGNOSIS — K219 Gastro-esophageal reflux disease without esophagitis: Secondary | ICD-10-CM | POA: Insufficient documentation

## 2013-02-03 DIAGNOSIS — M129 Arthropathy, unspecified: Secondary | ICD-10-CM | POA: Insufficient documentation

## 2013-02-03 DIAGNOSIS — E669 Obesity, unspecified: Secondary | ICD-10-CM | POA: Insufficient documentation

## 2013-02-03 NOTE — ED Notes (Signed)
My heart beat was 112 and my blood pressure was 185/79 per pt. Took an extra clonidine per pt.

## 2013-02-03 NOTE — ED Provider Notes (Signed)
CSN: [id number]     Arrival date & time 02/03/13  2328 History   First MD Initiated Contact with Patient 02/03/13 2334     Chief Complaint  Patient presents with  . Tachycardia   (Consider location/radiation/quality/duration/timing/severity/associated sxs/prior Treatment) HPI.... sense of rapid heart rate and hgh blood pressure approximately one hour ago. Patient took her vital signs at home. Pressure was 180/79 pulse of 112. She feel dizzy. No chest pain, dyspnea, diaphoresis, nausea. She took clonidine 0.1 mg by mouth. She feels much better now. No frank neurological deficits. Severity is mild. No other associated symptoms   Past Medical History  Diagnosis Date  . Chronic back pain 2007    disabled   . Obesity   . Hydrocephalus     treated with VP shunt and then intracranial tumor resection (?Cholesteatoma); no longer followed actively by neurosurgery or otolaryngology  . 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 Diabetes mellitus 1991  . Hypertension 1980  . GERD (gastroesophageal reflux disease) 2000  . Thyroid disease 2013    hypothyroid  . Hyperlipidemia 2013  . Arthritis     cervical spine   Past Surgical History  Procedure Laterality Date  . Ventriculoperitoneal shunt  1998    at [facility]  . Intracranial mass resected  1998    at [facility]; ? location-right middle ear  . Hemorrhoidectomy    . Trigger finger release      right thumb  . Tubal ligation  1980  . Colonoscopy  06/2010  . Tubal ligation     Family History  Problem Relation Age of Onset  . Cancer Mother     lung   . Hypertension Mother 48  . Kidney failure Brother   . Diabetes Sister   . Diabetes Brother   . Hypertension Brother    History  Substance Use Topics  . Smoking status: Former Smoker -- 0.50 packs/day for 15 years    Types: Cigarettes    Quit date: 07/07/2001  . Smokeless tobacco: Never Used  . Alcohol Use: No   OB History   Grav Para Term  Preterm Abortions TAB SAB Ect Mult Living   3 2   1   1  1      Review of Systems  All other systems reviewed and are negative.    Allergies  Amaryl; Aspirin; Avandia; Glimepiride; Glipizide; and Novolog  Home Medications   Current Outpatient Rx  Name  Route  Sig  Dispense  Refill  . ACCU-CHEK AVIVA PLUS test strip      USE AS DIRECTED TO CHECK BLOOD SUGAR THREE TIMES DAILY AS DIRECTED.   300 each   3   . ACCU-CHEK FASTCLIX LANCETS MISC               . Alcohol Swabs (B-D SINGLE USE SWABS REGULAR) PADS      Use as needed daily with insulin injections   300 each   1   . aspirin EC 81 MG tablet   Oral   Take 81 mg by mouth at bedtime.         . Blood Glucose Monitoring Suppl (ACCU-CHEK NANO SMARTVIEW) W/DEVICE KIT               . cloNIDine (CATAPRES) 0.1 MG tablet   Oral   Take 1 tablet (0.1 mg total) by mouth 2 (two) times daily.   60 tablet   6   .  docusate sodium (COLACE) 100 MG capsule   Oral   Take 100 mg by mouth every other day.          . gabapentin (NEURONTIN) 100 MG capsule      TAKE 1 CAPSULE TWICE DAILY   180 capsule   1   . hydrochlorothiazide (HYDRODIURIL) 25 MG tablet      TAKE 1 TABLET BY MOUTH ONCE A DAY.   30 tablet   1   . Ibuprofen 200 MG CAPS   Oral   Take by mouth. As needed         . insulin glargine (LANTUS SOLOSTAR) 100 UNIT/ML injection   Subcutaneous   Inject 25 Units into the skin at bedtime.          . Iron 66 MG TABS   Oral   Take 1 tablet by mouth 2 (two) times daily.          Marland Kitchen levothyroxine (SYNTHROID, LEVOTHROID) 50 MCG tablet   Oral   Take 50 mcg by mouth daily before breakfast.         . linagliptin (TRADJENTA) 5 MG TABS tablet   Oral   Take 5 mg by mouth daily.         Marland Kitchen nystatin (MYCOSTATIN) powder      Apply once daily at bedtime to rash on lower abdomen, for 1 week, then, as needed   60 g   0   . omeprazole (PRILOSEC) 40 MG capsule   Oral   Take 1 capsule (40 mg total) by  mouth daily.   30 capsule   0   . simvastatin (ZOCOR) 20 MG tablet   Oral   Take 20 mg by mouth at bedtime.           . valsartan (DIOVAN) 320 MG tablet   Oral   Take 1 tablet (320 mg total) by mouth daily.   90 tablet   1    BP 178/82  Pulse 97  Temp(Src) 98.3 F (36.8 C) (Oral)  Resp 20  Ht 5' 1.5" (1.562 m)  Wt 164 lb (74.39 kg)  BMI 30.49 kg/m2  SpO2 97% Physical Exam  Nursing note and vitals reviewed. Constitutional: She is oriented to person, place, and time. She appears well-developed and well-nourished.  HENT:  Head: Normocephalic and atraumatic.  Eyes: Conjunctivae and EOM are normal. Pupils are equal, round, and reactive to light.  Neck: Normal range of motion. Neck supple.  Cardiovascular: Normal rate, regular rhythm and normal heart sounds.   Pulmonary/Chest: Effort normal and breath sounds normal.  Abdominal: Soft. Bowel sounds are normal.  Musculoskeletal: Normal range of motion.  Neurological: She is alert and oriented to person, place, and time.  Skin: Skin is warm and dry.  Psychiatric: She has a normal mood and affect.    ED Course  Procedures (including critical care time) Labs Review Labs Reviewed - No data to display Imaging Review No results found.  EKG Interpretation   None      Date: 02/03/2013  Rate: 94  Rhythm: normal sinus rhythm  QRS Axis: normal  Intervals: normal  ST/T Wave abnormalities: normal  Conduction Disutrbances: none  Narrative Interpretation: unremarkable      MDM  No diagnosis found. Blood pressure and pulse are normalizing. No chest pain. Will monitor patient for short period of time and then this chart.    Donnetta Hutching, MD 02/04/13 [phone]

## 2013-02-14 ENCOUNTER — Encounter: Payer: Self-pay | Admitting: Family Medicine

## 2013-03-07 ENCOUNTER — Encounter: Payer: Self-pay | Admitting: Family Medicine

## 2013-03-16 ENCOUNTER — Other Ambulatory Visit: Payer: Self-pay

## 2013-03-16 DIAGNOSIS — B372 Candidiasis of skin and nail: Secondary | ICD-10-CM

## 2013-03-16 MED ORDER — NYSTATIN 100000 UNIT/GM EX POWD: CUTANEOUS | Status: DC

## 2013-03-21 ENCOUNTER — Ambulatory Visit: Payer: Medicare HMO

## 2013-04-03 ENCOUNTER — Other Ambulatory Visit: Payer: Self-pay | Admitting: Adult Health

## 2013-04-09 ENCOUNTER — Telehealth: Payer: Self-pay

## 2013-04-09 NOTE — Telephone Encounter (Signed)
Patient states that symptoms started 24 hours ago with sore throat, fever, cough, loss of appetite.  Cough is non productive.  She states that she feels too weak to come in for visit and did not sleep last night.  Would like to know if something can be sent to [facility].

## 2013-04-09 NOTE — Telephone Encounter (Signed)
Patient coming in tomorrow am for OV

## 2013-04-09 NOTE — Telephone Encounter (Signed)
With such general symptoms I recommend, tylenol  saltwater gargle , push fluids and rest. Needs to be seen for better eval, so encourage her to come as work in asap Ask about uTI symptoms pls

## 2013-04-10 ENCOUNTER — Ambulatory Visit (INDEPENDENT_AMBULATORY_CARE_PROVIDER_SITE_OTHER): Payer: Medicare HMO | Admitting: Family Medicine

## 2013-04-10 ENCOUNTER — Other Ambulatory Visit: Payer: Self-pay

## 2013-04-10 ENCOUNTER — Encounter: Payer: Self-pay | Admitting: Family Medicine

## 2013-04-10 ENCOUNTER — Encounter (INDEPENDENT_AMBULATORY_CARE_PROVIDER_SITE_OTHER): Payer: Self-pay

## 2013-04-10 VITALS — BP 140/82 | HR 99 | Temp 98.9°F | Resp 16 | Ht 61.5 in | Wt 165.0 lb

## 2013-04-10 DIAGNOSIS — Z794 Long term (current) use of insulin: Secondary | ICD-10-CM

## 2013-04-10 DIAGNOSIS — E109 Type 1 diabetes mellitus without complications: Secondary | ICD-10-CM

## 2013-04-10 DIAGNOSIS — I10 Essential (primary) hypertension: Secondary | ICD-10-CM

## 2013-04-10 DIAGNOSIS — B372 Candidiasis of skin and nail: Secondary | ICD-10-CM

## 2013-04-10 DIAGNOSIS — IMO0001 Reserved for inherently not codable concepts without codable children: Secondary | ICD-10-CM

## 2013-04-10 DIAGNOSIS — E119 Type 2 diabetes mellitus without complications: Secondary | ICD-10-CM

## 2013-04-10 DIAGNOSIS — J209 Acute bronchitis, unspecified: Secondary | ICD-10-CM

## 2013-04-10 MED ORDER — PENICILLIN V POTASSIUM 500 MG PO TABS: 500.0000 mg | ORAL_TABLET | Freq: Three times a day (TID) | ORAL | Status: DC

## 2013-04-10 MED ORDER — NYSTATIN 100000 UNIT/GM EX POWD: CUTANEOUS | Status: DC

## 2013-04-10 MED ORDER — FLUCONAZOLE 150 MG PO TABS: ORAL_TABLET | ORAL | Status: DC

## 2013-04-10 MED ORDER — BENZONATATE 100 MG PO CAPS: 100.0000 mg | ORAL_CAPSULE | Freq: Three times a day (TID) | ORAL | Status: DC

## 2013-04-10 MED ORDER — PROMETHAZINE-DM 6.25-15 MG/5ML PO SYRP: ORAL_SOLUTION | ORAL | Status: DC

## 2013-04-10 MED ORDER — GUAIFENESIN-CODEINE 100-10 MG/5ML PO SYRP: 5.0000 mL | ORAL_SOLUTION | Freq: Every evening | ORAL | Status: DC | PRN

## 2013-04-10 MED ORDER — CEFTRIAXONE SODIUM 500 MG IJ SOLR
500.0000 mg | Freq: Once | INTRAMUSCULAR | Status: AC
  Administered 2013-04-10: 500 mg via INTRAMUSCULAR

## 2013-04-10 NOTE — Patient Instructions (Signed)
Annual wellness in 4 to 6 weeks , call if you need me before  You are treated for acute bronchitis, Rocephin 500mg  IM in office and medication also sent iin  Flu vaccine next Friday  Pls bring all meds to next visit

## 2013-04-10 NOTE — Progress Notes (Signed)
   Subjective:    Patient ID: [name], female    DOB: [birth year], 63 y.o.   MRN: [id number]  HPI 3 day h/o generalized pain, cough productive of sputum and chills, no documented fever, however has poor appetite and feels weak. Denies sinus pressure, ear pain or sore throat. Blood sugar still fluctuates a lot and is better though still not at goal , reportedly, follows with endo   Review of Systems See HPI  Denies chest pains, palpitations and leg swelling Denies abdominal pain, nausea, vomiting,diarrhea or constipation.   Denies dysuria, frequency, hesitancy or incontinence.  Denies depression, anxiety or insomnia. Denies skin break down or rash.        Objective:   Physical Exam  Patient alert and oriented and in no cardiopulmonary distress.Ill appearing  HEENT: No facial asymmetry, EOMI, no sinus tenderness,  oropharynx pink and moist.  Neck supple no adenopathy.TM clear bilaterally  Chest: decreased though adequate air entry, scattered crackles, no wheezes  CVS: S1, S2 no murmurs, no S3.  ABD: Soft non tender. Bowel sounds normal.  Ext: No edema  MS: Adequate ROM spine, shoulders, hips and knees.  CNS: CN 2-12 intact, power, tnormal throughout.       Assessment & Plan:

## 2013-04-15 NOTE — Assessment & Plan Note (Signed)
Remains uncontrolled, followed by endoi Patient advised to reduce carb and sweets, commit to regular physical activity, take meds as prescribed, test blood as directed, and attempt to lose weight, to improve blood sugar control.

## 2013-04-15 NOTE — Assessment & Plan Note (Signed)
No current flare , ut with prolonged antibiotic use, vaginal yeast infection is anticipated, fluconazole prescribed for as needed use

## 2013-04-15 NOTE — Assessment & Plan Note (Signed)
Rocephin in office, antibiotic and decongestants also prescribed

## 2013-04-15 NOTE — Assessment & Plan Note (Signed)
Fair control, not at goal, pt has consistently been difficult to control DASH diet and commitment to daily physical activity for a minimum of 30 minutes discussed and encouraged, as a part of hypertension management. The importance of attaining a healthy weight is also discussed.

## 2013-04-25 ENCOUNTER — Telehealth: Payer: Self-pay | Admitting: Family Medicine

## 2013-04-25 DIAGNOSIS — J209 Acute bronchitis, unspecified: Secondary | ICD-10-CM

## 2013-04-26 ENCOUNTER — Telehealth: Payer: Self-pay | Admitting: Family Medicine

## 2013-04-26 ENCOUNTER — Ambulatory Visit (HOSPITAL_COMMUNITY)
Admission: RE | Admit: 2013-04-26 | Discharge: 2013-04-26 | Disposition: A | Discharge status: Home / Self Care | Payer: Medicare HMO | Source: Ambulatory Visit | Attending: Family Medicine | Admitting: Family Medicine

## 2013-04-26 DIAGNOSIS — R059 Cough, unspecified: Secondary | ICD-10-CM | POA: Insufficient documentation

## 2013-04-26 DIAGNOSIS — R05 Cough: Secondary | ICD-10-CM | POA: Insufficient documentation

## 2013-04-26 DIAGNOSIS — J209 Acute bronchitis, unspecified: Secondary | ICD-10-CM

## 2013-04-26 LAB — CBC WITH DIFFERENTIAL/PLATELET
BASOS PCT: 0 % (ref 0–1)
Basophils Absolute: 0 10*3/uL (ref 0.0–0.1)
EOS ABS: 0.2 10*3/uL (ref 0.0–0.7)
EOS PCT: 3 % (ref 0–5)
HEMATOCRIT: 34.4 % — AB (ref 36.0–46.0)
Hemoglobin: 11.2 g/dL — ABNORMAL LOW (ref 12.0–15.0)
Lymphocytes Relative: 21 % (ref 12–46)
Lymphs Abs: 1.6 10*3/uL (ref 0.7–4.0)
MCH: 28.1 pg (ref 26.0–34.0)
MCHC: 32.6 g/dL (ref 30.0–36.0)
MCV: 86.2 fL (ref 78.0–100.0)
MONO ABS: 0.6 10*3/uL (ref 0.1–1.0)
Monocytes Relative: 7 % (ref 3–12)
Neutro Abs: 5.3 10*3/uL (ref 1.7–7.7)
Neutrophils Relative %: 69 % (ref 43–77)
Platelets: 275 10*3/uL (ref 150–400)
RBC: 3.99 MIL/uL (ref 3.87–5.11)
RDW: 13.4 % (ref 11.5–15.5)
WBC: 7.8 10*3/uL (ref 4.0–10.5)

## 2013-04-26 LAB — BASIC METABOLIC PANEL
BUN: 16 mg/dL (ref 6–23)
CO2: 26 mEq/L (ref 19–32)
CREATININE: 1.32 mg/dL — AB (ref 0.50–1.10)
Calcium: 9.5 mg/dL (ref 8.4–10.5)
Chloride: 101 mEq/L (ref 96–112)
GLUCOSE: 169 mg/dL — AB (ref 70–99)
Potassium: 4.1 mEq/L (ref 3.5–5.3)
Sodium: 139 mEq/L (ref 135–145)

## 2013-04-26 NOTE — Telephone Encounter (Signed)
Patient would like to know if there is anything else that she can take.  She is feeling no better.  Please advise.

## 2013-04-26 NOTE — Addendum Note (Signed)
Addended by: Denman George B on: 04/26/2013 03:41 PM   Modules accepted: Orders

## 2013-04-26 NOTE — Telephone Encounter (Signed)
Concern sent to md for review.

## 2013-04-26 NOTE — Telephone Encounter (Signed)
Patient states that she feels fatigued, sore throat from coughing, cough, sneezing.  She is aware of advise and states that she will have tests done within the next day or so.

## 2013-04-26 NOTE — Telephone Encounter (Signed)
pls order CXR , cbc and diff and chem 7  As a sta and sputum c/s, also specifically document exactly her symptoms , I will be better led after that, she was see over 2 weeks ago

## 2013-04-29 LAB — RESPIRATORY CULTURE OR RESPIRATORY AND SPUTUM CULTURE: Organism ID, Bacteria: NORMAL

## 2013-05-14 ENCOUNTER — Encounter (INDEPENDENT_AMBULATORY_CARE_PROVIDER_SITE_OTHER): Payer: Self-pay

## 2013-05-14 ENCOUNTER — Ambulatory Visit (INDEPENDENT_AMBULATORY_CARE_PROVIDER_SITE_OTHER): Payer: Medicare HMO

## 2013-05-14 DIAGNOSIS — Z23 Encounter for immunization: Secondary | ICD-10-CM

## 2013-06-01 ENCOUNTER — Encounter: Payer: Medicare HMO | Admitting: Family Medicine

## 2013-06-04 ENCOUNTER — Other Ambulatory Visit: Payer: Self-pay | Admitting: Family Medicine

## 2013-06-13 ENCOUNTER — Other Ambulatory Visit: Payer: Self-pay | Admitting: Internal Medicine

## 2013-06-16 ENCOUNTER — Other Ambulatory Visit: Payer: Self-pay | Admitting: Cardiology

## 2013-06-25 ENCOUNTER — Telehealth: Payer: Self-pay | Admitting: Family Medicine

## 2013-06-25 NOTE — Telephone Encounter (Signed)
Discussed face-to-face with employee - confirmed referrals already done.

## 2013-06-28 ENCOUNTER — Encounter (HOSPITAL_COMMUNITY): Payer: Self-pay | Admitting: Pharmacy Technician

## 2013-07-11 NOTE — Patient Instructions (Signed)
Your procedure is scheduled on: 07/17/2013  Report to [facility] at  800 AM.  Call this number if you have problems the morning of surgery: [phone]   Do not eat food or drink liquids :After Midnight.      Take these medicines the morning of surgery with A SIP OF WATER: catapress, neurontin, synthroid, prilosec, diovan. Take 1/2 Lantus dose the night before your surgery.   Do not wear jewelry, make-up or nail polish.  Do not wear lotions, powders, or perfumes.   Do not shave 48 hours prior to surgery.  Do not bring valuables to the hospital.  Contacts, dentures or bridgework may not be worn into surgery.  Leave suitcase in the car. After surgery it may be brought to your room.  For patients admitted to the hospital, checkout time is 11:00 AM the day of discharge.   Patients discharged the day of surgery will not be allowed to drive home.  :     Please read over the following fact sheets that you were given: Coughing and Deep Breathing, Surgical Site Infection Prevention, Anesthesia Post-op Instructions and Care and Recovery After Surgery    Cataract A cataract is a clouding of the lens of the eye. When a lens becomes cloudy, vision is reduced based on the degree and nature of the clouding. Many cataracts reduce vision to some degree. Some cataracts make people more near-sighted as they develop. Other cataracts increase glare. Cataracts that are ignored and become worse can sometimes look white. The white color can be seen through the pupil. CAUSES   Aging. However, cataracts may occur at any age, even in newborns.   Certain drugs.   Trauma to the eye.   Certain diseases such as diabetes.   Specific eye diseases such as chronic inflammation inside the eye or a sudden attack of a rare form of glaucoma.   Inherited or acquired medical problems.  SYMPTOMS   Gradual, progressive drop in vision in the affected eye.   Severe, rapid visual loss. This most often happens when trauma  is the cause.  DIAGNOSIS  To detect a cataract, an eye doctor examines the lens. Cataracts are best diagnosed with an exam of the eyes with the pupils enlarged (dilated) by drops.  TREATMENT  For an early cataract, vision may improve by using different eyeglasses or stronger lighting. If that does not help your vision, surgery is the only effective treatment. A cataract needs to be surgically removed when vision loss interferes with your everyday activities, such as driving, reading, or watching TV. A cataract may also have to be removed if it prevents examination or treatment of another eye problem. Surgery removes the cloudy lens and usually replaces it with a substitute lens (intraocular lens, IOL).  At a time when both you and your doctor agree, the cataract will be surgically removed. If you have cataracts in both eyes, only one is usually removed at a time. This allows the operated eye to heal and be out of danger from any possible problems after surgery (such as infection or poor wound healing). In rare cases, a cataract may be doing damage to your eye. In these cases, your caregiver may advise surgical removal right away. The vast majority of people who have cataract surgery have better vision afterward. HOME CARE INSTRUCTIONS  If you are not planning surgery, you may be asked to do the following:  Use different eyeglasses.   Use stronger or brighter lighting.   Ask  your eye doctor about reducing your medicine dose or changing medicines if it is thought that a medicine caused your cataract. Changing medicines does not make the cataract go away on its own.   Become familiar with your surroundings. Poor vision can lead to injury. Avoid bumping into things on the affected side. You are at a higher risk for tripping or falling.   Exercise extreme care when driving or operating machinery.   Wear sunglasses if you are sensitive to bright light or experiencing problems with glare.  SEEK  IMMEDIATE MEDICAL CARE IF:   You have a worsening or sudden vision loss.   You notice redness, swelling, or increasing pain in the eye.   You have a fever.  Document Released: 03/22/2005 Document Revised: 03/11/2011 Document Reviewed: 11/13/2010 [facility] Patient Information 2012 [place].PATIENT INSTRUCTIONS POST-ANESTHESIA  IMMEDIATELY FOLLOWING SURGERY:  Do not drive or operate machinery for the first twenty four hours after surgery.  Do not make any important decisions for twenty four hours after surgery or while taking narcotic pain medications or sedatives.  If you develop intractable nausea and vomiting or a severe headache please notify your doctor immediately.  FOLLOW-UP:  Please make an appointment with your surgeon as instructed. You do not need to follow up with anesthesia unless specifically instructed to do so.  WOUND CARE INSTRUCTIONS (if applicable):  Keep a dry clean dressing on the anesthesia/puncture wound site if there is drainage.  Once the wound has quit draining you may leave it open to air.  Generally you should leave the bandage intact for twenty four hours unless there is drainage.  If the epidural site drains for more than 36-48 hours please call the anesthesia department.  QUESTIONS?:  Please feel free to call your physician or the hospital operator if you have any questions, and they will be happy to assist you.

## 2013-07-12 ENCOUNTER — Encounter (HOSPITAL_COMMUNITY)
Admission: RE | Admit: 2013-07-12 | Discharge: 2013-07-12 | Disposition: A | Discharge status: Home / Self Care | Payer: Medicare HMO | Source: Ambulatory Visit | Attending: Ophthalmology | Admitting: Ophthalmology

## 2013-07-12 ENCOUNTER — Encounter (HOSPITAL_COMMUNITY): Payer: Self-pay

## 2013-07-12 DIAGNOSIS — Z01812 Encounter for preprocedural laboratory examination: Secondary | ICD-10-CM | POA: Insufficient documentation

## 2013-07-12 HISTORY — DX: Anemia, unspecified: D64.9

## 2013-07-12 HISTORY — DX: Hypothyroidism, unspecified: E03.9

## 2013-07-12 HISTORY — DX: Polyneuropathy, unspecified: G62.9

## 2013-07-12 LAB — BASIC METABOLIC PANEL
BUN: 23 mg/dL (ref 6–23)
CO2: 29 mEq/L (ref 19–32)
Calcium: 10 mg/dL (ref 8.4–10.5)
Chloride: 102 mEq/L (ref 96–112)
Creatinine, Ser: 1.46 mg/dL — ABNORMAL HIGH (ref 0.50–1.10)
GFR, EST AFRICAN AMERICAN: 44 mL/min — AB (ref >90)
GFR, EST NON AFRICAN AMERICAN: 38 mL/min — AB (ref >90)
Glucose, Bld: 253 mg/dL — ABNORMAL HIGH (ref 70–99)
POTASSIUM: 4.5 meq/L (ref 3.7–5.3)
SODIUM: 140 meq/L (ref 137–147)

## 2013-07-12 LAB — HEMOGLOBIN A1C
HEMOGLOBIN A1C: 8.4 % — AB (ref <5.7)
MEAN PLASMA GLUCOSE: 194 mg/dL — AB (ref <117)

## 2013-07-12 LAB — HEMOGLOBIN AND HEMATOCRIT, BLOOD
HCT: 33.6 % — ABNORMAL LOW (ref 36.0–46.0)
HEMOGLOBIN: 10.7 g/dL — AB (ref 12.0–15.0)

## 2013-07-12 NOTE — Pre-Procedure Instructions (Signed)
Patient given information to sign up for my chart at home. 

## 2013-07-17 ENCOUNTER — Ambulatory Visit (HOSPITAL_COMMUNITY): Payer: Medicare HMO | Admitting: Anesthesiology

## 2013-07-17 ENCOUNTER — Ambulatory Visit (HOSPITAL_COMMUNITY)
Admission: RE | Admit: 2013-07-17 | Discharge: 2013-07-17 | Disposition: A | Discharge status: Home / Self Care | Payer: Medicare HMO | Source: Ambulatory Visit | Attending: Ophthalmology | Admitting: Ophthalmology

## 2013-07-17 ENCOUNTER — Encounter (HOSPITAL_COMMUNITY)
Admission: RE | Disposition: A | Discharge status: Home / Self Care | Payer: Self-pay | Source: Ambulatory Visit | Attending: Ophthalmology

## 2013-07-17 ENCOUNTER — Encounter (HOSPITAL_COMMUNITY): Payer: Medicare HMO | Admitting: Anesthesiology

## 2013-07-17 ENCOUNTER — Encounter (HOSPITAL_COMMUNITY): Payer: Self-pay

## 2013-07-17 DIAGNOSIS — I10 Essential (primary) hypertension: Secondary | ICD-10-CM | POA: Insufficient documentation

## 2013-07-17 DIAGNOSIS — Z79899 Other long term (current) drug therapy: Secondary | ICD-10-CM | POA: Insufficient documentation

## 2013-07-17 DIAGNOSIS — Z87891 Personal history of nicotine dependence: Secondary | ICD-10-CM | POA: Diagnosis not present

## 2013-07-17 DIAGNOSIS — H2510 Age-related nuclear cataract, unspecified eye: Secondary | ICD-10-CM | POA: Insufficient documentation

## 2013-07-17 DIAGNOSIS — Z7982 Long term (current) use of aspirin: Secondary | ICD-10-CM | POA: Insufficient documentation

## 2013-07-17 DIAGNOSIS — Z794 Long term (current) use of insulin: Secondary | ICD-10-CM | POA: Insufficient documentation

## 2013-07-17 DIAGNOSIS — E119 Type 2 diabetes mellitus without complications: Secondary | ICD-10-CM | POA: Diagnosis not present

## 2013-07-17 DIAGNOSIS — K219 Gastro-esophageal reflux disease without esophagitis: Secondary | ICD-10-CM | POA: Insufficient documentation

## 2013-07-17 HISTORY — PX: CATARACT EXTRACTION W/PHACO: SHX586

## 2013-07-17 LAB — GLUCOSE, CAPILLARY: GLUCOSE-CAPILLARY: 167 mg/dL — AB (ref 70–99)

## 2013-07-17 SURGERY — PHACOEMULSIFICATION, CATARACT, WITH IOL INSERTION
Anesthesia: Monitor Anesthesia Care | Site: Eye | Laterality: Right

## 2013-07-17 MED ORDER — PROVISC 10 MG/ML IO SOLN
INTRAOCULAR | Status: DC | PRN
  Administered 2013-07-17: 0.85 mL via INTRAOCULAR

## 2013-07-17 MED ORDER — TETRACAINE HCL 0.5 % OP SOLN
1.0000 [drp] | OPHTHALMIC | Status: AC
  Administered 2013-07-17 (×3): 1 [drp] via OPHTHALMIC

## 2013-07-17 MED ORDER — MIDAZOLAM HCL 2 MG/2ML IJ SOLN
INTRAMUSCULAR | Status: AC
  Filled 2013-07-17: qty 2

## 2013-07-17 MED ORDER — KETOROLAC TROMETHAMINE 0.5 % OP SOLN
1.0000 [drp] | OPHTHALMIC | Status: AC
  Administered 2013-07-17 (×3): 1 [drp] via OPHTHALMIC

## 2013-07-17 MED ORDER — FENTANYL CITRATE 0.05 MG/ML IJ SOLN: 25.0000 ug | INTRAMUSCULAR | Status: DC | PRN

## 2013-07-17 MED ORDER — EPINEPHRINE HCL 1 MG/ML IJ SOLN
INTRAMUSCULAR | Status: DC | PRN
  Administered 2013-07-17: 10:00:00

## 2013-07-17 MED ORDER — TETRACAINE 0.5 % OP SOLN OPTIME - NO CHARGE
OPHTHALMIC | Status: DC | PRN
  Administered 2013-07-17: 2 [drp] via OPHTHALMIC

## 2013-07-17 MED ORDER — CYCLOPENTOLATE-PHENYLEPHRINE 0.2-1 % OP SOLN
1.0000 [drp] | OPHTHALMIC | Status: AC
  Administered 2013-07-17 (×3): 1 [drp] via OPHTHALMIC

## 2013-07-17 MED ORDER — BSS IO SOLN
INTRAOCULAR | Status: DC | PRN
Start: 2013-07-17 — End: 2013-07-17
  Administered 2013-07-17: 15 mL via INTRAOCULAR

## 2013-07-17 MED ORDER — ONDANSETRON HCL 4 MG/2ML IJ SOLN: 4.0000 mg | Freq: Once | INTRAMUSCULAR | Status: DC | PRN

## 2013-07-17 MED ORDER — FENTANYL CITRATE 0.05 MG/ML IJ SOLN
25.0000 ug | INTRAMUSCULAR | Status: AC
  Administered 2013-07-17: 25 ug via INTRAVENOUS
  Filled 2013-07-17: qty 2

## 2013-07-17 MED ORDER — PHENYLEPHRINE HCL 2.5 % OP SOLN
1.0000 [drp] | OPHTHALMIC | Status: AC
  Administered 2013-07-17 (×3): 1 [drp] via OPHTHALMIC

## 2013-07-17 MED ORDER — LACTATED RINGERS IV SOLN
INTRAVENOUS | Status: DC
  Administered 2013-07-17: 09:00:00 via INTRAVENOUS

## 2013-07-17 MED ORDER — MIDAZOLAM HCL 2 MG/2ML IJ SOLN
1.0000 mg | INTRAMUSCULAR | Status: DC | PRN
  Administered 2013-07-17: 2 mg via INTRAVENOUS

## 2013-07-17 SURGICAL SUPPLY — 24 items
CAPSULAR TENSION RING-AMO (OPHTHALMIC RELATED) IMPLANT
CLOTH BEACON ORANGE TIMEOUT ST (SAFETY) ×2 IMPLANT
EYE SHIELD UNIVERSAL CLEAR (GAUZE/BANDAGES/DRESSINGS) ×2 IMPLANT
GLOVE BIO SURGEON STRL SZ 6.5 (GLOVE) IMPLANT
GLOVE BIO SURGEONS STRL SZ 6.5 (GLOVE)
GLOVE ECLIPSE 6.5 STRL STRAW (GLOVE) IMPLANT
GLOVE ECLIPSE 7.0 STRL STRAW (GLOVE) IMPLANT
GLOVE EXAM NITRILE LRG STRL (GLOVE) ×2 IMPLANT
GLOVE EXAM NITRILE MD LF STRL (GLOVE) IMPLANT
GLOVE SKINSENSE NS SZ6.5 (GLOVE) ×2
GLOVE SKINSENSE STRL SZ6.5 (GLOVE) IMPLANT
HEALON 5 0.6 ML (INTRAOCULAR LENS) IMPLANT
KIT VITRECTOMY (OPHTHALMIC RELATED) IMPLANT
PAD ARMBOARD 7.5X6 YLW CONV (MISCELLANEOUS) ×2 IMPLANT
PROC W NO LENS (INTRAOCULAR LENS)
PROC W SPEC LENS (INTRAOCULAR LENS)
PROCESS W NO LENS (INTRAOCULAR LENS) IMPLANT
PROCESS W SPEC LENS (INTRAOCULAR LENS) IMPLANT
RING MALYGIN (MISCELLANEOUS) IMPLANT
SIGHTPATH CAT PROC W REG LENS (Ophthalmic Related) ×3 IMPLANT
TAPE SURG TRANSPORE 1 IN (GAUZE/BANDAGES/DRESSINGS) IMPLANT
TAPE SURGICAL TRANSPORE 1 IN (GAUZE/BANDAGES/DRESSINGS) ×2
VISCOELASTIC ADDITIONAL (OPHTHALMIC RELATED) IMPLANT
WATER STERILE IRR 250ML POUR (IV SOLUTION) ×2 IMPLANT

## 2013-07-17 NOTE — Anesthesia Postprocedure Evaluation (Signed)
  Anesthesia Post-op Note  Patient: Christine Dominguez  Procedure(s) Performed: Procedure(s) with comments: CATARACT EXTRACTION PHACO AND INTRAOCULAR LENS PLACEMENT (IOC) (Right) - CDE:7.24  Patient Location: Short Stay  Anesthesia Type:MAC  Level of Consciousness: awake, alert  and oriented  Airway and Oxygen Therapy: Patient Spontanous Breathing  Post-op Pain: none  Post-op Assessment: Post-op Vital signs reviewed, Patient's Cardiovascular Status Stable, Respiratory Function Stable and Patent Airway  Post-op Vital Signs: Reviewed and stable  Last Vitals:  Filed Vitals:   07/17/13 0910  BP: 141/70  Pulse:   Temp:   Resp: 43    Complications: No apparent anesthesia complications

## 2013-07-17 NOTE — H&P (Signed)
The patient was re examined and there is no change in the patients condition since the original H and P. 

## 2013-07-17 NOTE — Discharge Instructions (Signed)
Christine Dominguez  07/17/2013           [place] Instructions [facility] [address]      1. Avoid closing eyes tightly. One often closes the eye tightly when laughing, talking, sneezing, coughing or if they feel irritated. At these times, you should be careful not to close your eyes tightly.  2. Instill eye drops as instructed. To instill drops in your eye, open it, look up and have someone gently pull the lower lid down and instill a couple of drops inside the lower lid.  3. Do not touch upper lid.  4. Take Advil or Tylenol for pain.  5. You may use either eye for near work, such as reading or sewing and you may watch television.  6. You may have your hair done at the beauty parlor at any time.  7. Wear dark glasses with or without your own glasses if you are in bright light.  8. Call our office at [phone] or [phone] if you have sharp pain in your eye or unusual symptoms.  9. Do not be concerned because vision in the operative eye is not good. It will not be good, no matter how successful the operation, until you get a special lens for it. Your old glasses will not be suited to the new eye that was operated on and you will not be ready for a new lens for about a month.  10. Follow up at the [facility] office.    I have received a copy of the above instructions and will follow them.   Follow up with Dr. Gershon Crane today between 2-3 pm

## 2013-07-17 NOTE — Op Note (Signed)
Patient brought to the operating room and prepped and draped in the usual manner. Lid speculum inserted in left eye. Stab incision made at the twelve o'clock position. Provisc instilled in the anterior chamber. A 2.4 mm. Stab incision was made temporally. An anterior capsulotomy was done with a bent 25 gauge needle. The nucleus was hydrodissected. The Phaco tip was inserted in the anterior chamber and the nucleus was emulsified. CDE was 7.24. The cortical material was then removed with the I and A tip. Posterior capsule was the polished. The anterior chamber was deepened with Provisc. A 16.5 Alcon [id number] IOL was then inserted in the capsular bag. Provisc was then removed with the I and A tip. The wound was then hydrated. Patient sent to the Recovery Room in good condition with follow up in my office.  Preoperative Diagnosis: Nuclear Cataract OD  Postoperative Diagnosis: Same  Procedure name: Kelman Phacoemulsification OD with IOL

## 2013-07-17 NOTE — Anesthesia Preprocedure Evaluation (Signed)
Anesthesia Evaluation  Patient identified by MRN, date of birth, ID band Patient awake    Reviewed: Allergy & Precautions, H&P , NPO status , Patient's Chart, lab work & pertinent test results  Airway Mallampati: II TM Distance: >3 FB Neck ROM: Full    Dental  (+) Partial Upper, Partial Lower   Pulmonary former smoker,  breath sounds clear to auscultation        Cardiovascular hypertension, Pt. on medications Rhythm:Regular Rate:Normal     Neuro/Psych Hydrocephalus + VP Shunt     GI/Hepatic GERD-  Medicated,  Endo/Other  diabetes, Type 2Hypothyroidism   Renal/GU      Musculoskeletal   Abdominal   Peds  Hematology  (+) anemia ,   Anesthesia Other Findings   Reproductive/Obstetrics                           Anesthesia Physical Anesthesia Plan  ASA: III  Anesthesia Plan: MAC   Post-op Pain Management:    Induction: Intravenous  Airway Management Planned: Nasal Cannula  Additional Equipment:   Intra-op Plan:   Post-operative Plan:   Informed Consent: I have reviewed the patients History and Physical, chart, labs and discussed the procedure including the risks, benefits and alternatives for the proposed anesthesia with the patient or authorized representative who has indicated his/her understanding and acceptance.     Plan Discussed with:   Anesthesia Plan Comments:         Anesthesia Quick Evaluation

## 2013-07-17 NOTE — Transfer of Care (Signed)
Immediate Anesthesia Transfer of Care Note  Patient: Christine Dominguez  Procedure(s) Performed: Procedure(s) with comments: CATARACT EXTRACTION PHACO AND INTRAOCULAR LENS PLACEMENT (IOC) (Right) - CDE:7.24  Patient Location: Short Stay  Anesthesia Type:MAC  Level of Consciousness: awake  Airway & Oxygen Therapy: Patient Spontanous Breathing  Post-op Assessment: Report given to PACU RN  Post vital signs: Reviewed  Complications: No apparent anesthesia complications

## 2013-07-18 ENCOUNTER — Encounter (HOSPITAL_COMMUNITY): Payer: Self-pay | Admitting: Ophthalmology

## 2013-07-18 NOTE — Addendum Note (Signed)
Addendum created 07/18/13 1125 by Ollen Bowl, CRNA   Modules edited: Anesthesia Flowsheet

## 2013-08-01 ENCOUNTER — Ambulatory Visit (INDEPENDENT_AMBULATORY_CARE_PROVIDER_SITE_OTHER): Payer: Medicare HMO | Admitting: Family Medicine

## 2013-08-01 ENCOUNTER — Ambulatory Visit (INDEPENDENT_AMBULATORY_CARE_PROVIDER_SITE_OTHER): Payer: Commercial Managed Care - HMO | Admitting: Cardiology

## 2013-08-01 ENCOUNTER — Encounter: Payer: Self-pay | Admitting: Family Medicine

## 2013-08-01 ENCOUNTER — Encounter: Payer: Self-pay | Admitting: Cardiology

## 2013-08-01 VITALS — BP 126/62 | HR 85 | Ht 61.0 in | Wt 163.0 lb

## 2013-08-01 VITALS — BP 128/74 | HR 78 | Resp 16 | Ht 61.5 in | Wt 164.4 lb

## 2013-08-01 DIAGNOSIS — D447 Neoplasm of uncertain behavior of aortic body and other paraganglia: Secondary | ICD-10-CM | POA: Insufficient documentation

## 2013-08-01 DIAGNOSIS — I10 Essential (primary) hypertension: Secondary | ICD-10-CM

## 2013-08-01 DIAGNOSIS — C755 Malignant neoplasm of aortic body and other paraganglia: Secondary | ICD-10-CM

## 2013-08-01 DIAGNOSIS — Z0000 Encounter for general adult medical examination without abnormal findings: Secondary | ICD-10-CM

## 2013-08-01 MED ORDER — TIZANIDINE HCL 2 MG PO TABS: 2.0000 mg | ORAL_TABLET | Freq: Every day | ORAL | Status: DC | PRN

## 2013-08-01 NOTE — Patient Instructions (Signed)
Your physician wants you to follow-up in: 6 months You will receive a reminder letter in the mail two months in advance. If you don't receive a letter, please call our office to schedule the follow-up appointment.     Your physician recommends that you continue on your current medications as directed. Please refer to the Current Medication list given to you today.      Thank you for choosing [facility] !        

## 2013-08-01 NOTE — Progress Notes (Signed)
Subjective:    Patient ID: Christine Dominguez, female    DOB: [birth year], 63 y.o.   MRN: [id number]  HPI Preventive Screening-Counseling & Management   Patient present here today for a subsequent Medicare annual wellness visit.   Current Problems (verified)   Medications Prior to Visit Allergies (verified)   PAST HISTORY  Family History  Social History Widow x 1 year. No children living , her only son was killed at age 50 , 36 years ago. Disabled since age 63, for back pain   Risk Factors  Current exercise habits:  None regular , will try to get back to the [facility]  Dietary issues discussed:low sugar , heart healthy, low sugar   Cardiac risk factors: IDDM, no known f/h of CAD  Depression Screen  (Note: if answer to either of the following is "Yes", a more complete depression screening is indicated)   Over the past two weeks, have you felt down, depressed or hopeless? No  Over the past two weeks, have you felt little interest or pleasure in doing things? No  Have you lost interest or pleasure in daily life? No  Do you often feel hopeless? No  Do you cry easily over simple problems? No   Activities of Daily Living  In your present state of health, do you have any difficulty performing the following activities?  Driving?: No Managing money?: No Feeding yourself?:No Getting from bed to chair?:No Climbing a flight of stairs?:sometimes due to back pain Preparing food and eating?:No Bathing or showering?:No Getting dressed?:No Getting to the toilet?:No Using the toilet?:No Moving around from place to place?: No  Fall Risk Assessment In the past year have you fallen or had a near fall?:No Are you currently taking any medications that make you dizzy?:No   Hearing Difficulties: No Do you often ask people to speak up or repeat themselves?:No Do you experience ringing or noises in your ears?:yes Do you have difficulty understanding soft or whispered voices?:yes,  right hearing loss  Cognitive Testing  Alert? Yes Normal Appearance?Yes  Oriented to person? Yes Place? Yes  Time? Yes  Displays appropriate judgment?Yes  Can read the correct time from a watch face? yes Are you having problems remembering things?No  Advanced Directives have been discussed with the patient?Yes , full code   List the Names of Other Physician/Practitioners you currently use: Dr Dorris Fetch, Maryanna Shape cardiology, ENT at [facility] any recent [place] you may have received from other than [facility] providers in the past year (date may be approximate).   Assessment:    Annual Wellness Exam   Plan:      Patient Instructions (the written plan) was given to the patient.  Medicare Attestation  I have personally reviewed:  The patient's medical and social history  Their use of alcohol, tobacco or illicit drugs  Their current medications and supplements  The patient's functional ability including ADLs,fall risks, home safety risks, cognitive, and hearing and visual impairment  Diet and physical activities  Evidence for depression or mood disorders  The patient's weight, height, BMI, and visual acuity have been recorded in the chart. I have made referrals, counseling, and provided education to the patient based on review of the above and I have provided the patient with a written personalized care plan for preventive services.      Review of Systems     Objective:   Physical Exam        Assessment & Plan:  Malignant paraganglioma  Pt needs referrla for annual f/u at [facility]  And this is entered, due in August  Paraganglioma Referred for annual f/u at [facility]  Routine general medical examination at a health care facility Annual exam as documented. Counseling done  re healthy lifestyle involving commitment to 150 minutes exercise per week, heart healthy diet, and attaining healthy weight.The importance of adequate sleep also discussed. Regular seat belt use and  safe storage  of firearms if patient has them, is also discussed. Changes in health habits are decided on by the patient with goals and time frames  set for achieving them. Immunization and cancer screening needs are specifically addressed at this visit.

## 2013-08-01 NOTE — Progress Notes (Signed)
Clinical Summary Ms. Scales-Beveridge is a 63 y.o.female presenting to the office for a routine visit. She is a former patient of Dr. Lattie Haw, saw Ms. Lawrence NP last in October 2014. I reviewed her history, outlined below. She tells me that she has been feeling very well, has been trying to be more active with walking, has improved energy. Her blood pressure is well-controlled today. She reports compliance with her medications.  Recent lab showed hemoglobin 10.7, hematocrit 33.6, sodium 140, potassium 4.5, BUN 23, creatinine 1.4 which was stable, hemoglobin A1c 8.4. ECG from November 2014 showed sinus rhythm with decreased R-wave progression.   Allergies  Allergen Reactions  . Amaryl   . Aspirin     Burning in stomach, nausea  . Avandia [Rosiglitazone Maleate]   . Glimepiride   . Glipizide   . Novolog [Insulin Aspart]     Current Outpatient Prescriptions  Medication Sig Dispense Refill  . aspirin EC 81 MG tablet Take 81 mg by mouth at bedtime.      . cloNIDine (CATAPRES) 0.1 MG tablet Take 0.1 mg by mouth daily.      Marland Kitchen docusate sodium (COLACE) 100 MG capsule Take 100 mg by mouth every other day.       . gabapentin (NEURONTIN) 100 MG capsule TAKE 1 CAPSULE TWICE DAILY  180 capsule  1  . hydrochlorothiazide (HYDRODIURIL) 25 MG tablet TAKE 1 TABLET BY MOUTH ONCE A DAY.  30 tablet  1  . Ibuprofen 200 MG CAPS Take 200 mg by mouth every 6 (six) hours as needed (pain). As needed      . insulin glargine (LANTUS SOLOSTAR) 100 UNIT/ML injection Inject 25 Units into the skin at bedtime.       . Iron 66 MG TABS Take 1 tablet by mouth 2 (two) times daily.       Marland Kitchen levothyroxine (SYNTHROID, LEVOTHROID) 50 MCG tablet Take 50 mcg by mouth daily before breakfast.      . linagliptin (TRADJENTA) 5 MG TABS tablet Take 5 mg by mouth daily.      Marland Kitchen nystatin (MYCOSTATIN) powder Apply topically as needed.  60 g  0  . omeprazole (PRILOSEC) 40 MG capsule TAKE (1) CAPSULE BY MOUTH TWICE DAILY.  30 capsule   6  . simvastatin (ZOCOR) 20 MG tablet Take 20 mg by mouth at bedtime.        . valsartan (DIOVAN) 320 MG tablet Take 160 mg by mouth daily.       No current facility-administered medications for this visit.    Past Medical History  Diagnosis Date  . Chronic back pain 2007    Disabled   . Obesity   . Hydrocephalus     Treated with VP shunt and then intracranial tumor resection (?Cholesteatoma); no longer followed actively by neurosurgery or otolaryngology  . Cancer 1998, recurred in 2012    Paraganglioma of jugular tympamicum, recurrent in 2012  . Type 2 diabetes mellitus 1991  . Essential hypertension, benign 1980  . GERD (gastroesophageal reflux disease) 2000  . Hypothyroidism 2013  . Hyperlipidemia 2013  . Arthritis     Cervical spine  . Neuropathy   . Anemia   . Tumor, secondary     Inner ear -right ear    Social History Ms. Scales-Beveridge reports that she quit smoking about 12 years ago. Her smoking use included Cigarettes. She has a 7.5 pack-year smoking history. She has never used smokeless tobacco. [name] reports that she does not  drink alcohol.  Review of Systems She denies any chest pain, palpitations, or syncope. No leg edema. Stable appetite. Otherwise negative.  Physical Examination Filed Vitals:   08/01/13 0956  BP: 126/62  Pulse: 85   Filed Weights   08/01/13 0956  Weight: 163 lb (73.936 kg)   Patient appears comfortable at rest. HEENT: Conjunctiva normal, oropharynx clear with moist mucosa. Neck: Supple, no carotid bruits, VP shunt on left side, no thyromegaly. Lungs: Clear to auscultation, nonlabored breathing at rest. Cardiac: Regular rate and rhythm, no S3 soft, systolic murmur, no pericardial rub. Abdomen: Soft, nontender, bowel sounds present, no guarding or rebound. Extremities: No pitting edema, distal pulses 2+. Skin: Warm and dry. Musculoskeletal: No kyphosis. Neuropsychiatric: Alert and oriented x3, affect grossly  appropriate.   Problem List and Plan   Essential hypertension, benign Blood pressure control is reasonable today. No change to current regimen. I encouraged her to continue to walk for exercise. Her weight is down a few pounds from last visit.    Satira Sark, M.D., F.A.C.C.

## 2013-08-01 NOTE — Patient Instructions (Addendum)
F/u in early October, call if you need me before  Please start regular exercise for health  Please work on keeping blood sugar and blood pressure well controlled  Zanaflex for use as needed for back pain and spasm  You are referred to [facility] in June to f/u with ENT as before

## 2013-08-01 NOTE — Assessment & Plan Note (Signed)
Blood pressure control is reasonable today. No change to current regimen. I encouraged her to continue to walk for exercise. Her weight is down a few pounds from last visit.

## 2013-08-03 ENCOUNTER — Other Ambulatory Visit: Payer: Self-pay | Admitting: Family Medicine

## 2013-08-07 ENCOUNTER — Other Ambulatory Visit: Payer: Self-pay | Admitting: Family Medicine

## 2013-08-07 DIAGNOSIS — Z1231 Encounter for screening mammogram for malignant neoplasm of breast: Secondary | ICD-10-CM

## 2013-08-08 ENCOUNTER — Telehealth: Payer: Self-pay | Admitting: Family Medicine

## 2013-08-08 NOTE — Telephone Encounter (Signed)
Noted  

## 2013-08-21 ENCOUNTER — Telehealth: Payer: Self-pay | Admitting: Cardiology

## 2013-08-21 ENCOUNTER — Other Ambulatory Visit: Payer: Self-pay | Admitting: Internal Medicine

## 2013-08-21 MED ORDER — HYDROCHLOROTHIAZIDE 25 MG PO TABS: 25.0000 mg | ORAL_TABLET | Freq: Every day | ORAL | Status: DC

## 2013-08-21 NOTE — Telephone Encounter (Signed)
Please see paper in refill bin / tgs  °

## 2013-08-21 NOTE — Telephone Encounter (Signed)
Refill request complete 

## 2013-08-22 ENCOUNTER — Other Ambulatory Visit: Payer: Self-pay

## 2013-08-22 MED ORDER — GABAPENTIN 100 MG PO CAPS: ORAL_CAPSULE | ORAL | Status: DC

## 2013-08-23 ENCOUNTER — Telehealth: Payer: Self-pay | Admitting: Cardiology

## 2013-08-23 NOTE — Telephone Encounter (Signed)
Please see refill bin / tgs  °

## 2013-08-23 NOTE — Telephone Encounter (Signed)
Form in Dr. Myles Gip folder.

## 2013-08-24 ENCOUNTER — Telehealth: Payer: Self-pay | Admitting: *Deleted

## 2013-08-24 ENCOUNTER — Telehealth: Payer: Self-pay | Admitting: Family Medicine

## 2013-08-24 ENCOUNTER — Other Ambulatory Visit: Payer: Self-pay | Admitting: *Deleted

## 2013-08-24 MED ORDER — VALSARTAN 320 MG PO TABS: ORAL_TABLET | ORAL | Status: DC

## 2013-08-24 MED ORDER — CLONIDINE HCL 0.1 MG PO TABS: 0.1000 mg | ORAL_TABLET | Freq: Every day | ORAL | Status: DC

## 2013-08-24 NOTE — Telephone Encounter (Signed)
Medication sent via escribe.  

## 2013-08-24 NOTE — Telephone Encounter (Signed)
PT NEEDS DIOVAN AND CLONIDINE NEEDS CALLED IN TO [facility]

## 2013-08-28 MED ORDER — CLONIDINE HCL 0.1 MG PO TABS: 0.1000 mg | ORAL_TABLET | Freq: Every day | ORAL | Status: DC

## 2013-08-28 NOTE — Telephone Encounter (Signed)
meds have been refilled. Left message to call back if there was anything in particular left out

## 2013-08-30 ENCOUNTER — Other Ambulatory Visit: Payer: Self-pay | Admitting: *Deleted

## 2013-08-30 ENCOUNTER — Telehealth: Payer: Self-pay | Admitting: Cardiology

## 2013-08-30 MED ORDER — HYDROCHLOROTHIAZIDE 25 MG PO TABS: 12.5000 mg | ORAL_TABLET | Freq: Every day | ORAL | Status: DC

## 2013-08-30 MED ORDER — VALSARTAN 160 MG PO TABS: ORAL_TABLET | ORAL | Status: DC

## 2013-08-30 NOTE — Telephone Encounter (Signed)
Faxed forms to [facility]

## 2013-08-30 NOTE — Telephone Encounter (Signed)
Please see paper in refill bin / tgs  °

## 2013-09-04 ENCOUNTER — Other Ambulatory Visit: Payer: Self-pay

## 2013-09-04 ENCOUNTER — Telehealth: Payer: Self-pay | Admitting: Family Medicine

## 2013-09-04 MED ORDER — NYSTATIN 100000 UNIT/GM EX POWD: CUTANEOUS | Status: DC

## 2013-09-04 MED ORDER — OMEPRAZOLE 40 MG PO CPDR: DELAYED_RELEASE_CAPSULE | ORAL | Status: DC

## 2013-09-04 NOTE — Telephone Encounter (Signed)
Spoke with patient and refilled requested meds.

## 2013-09-14 ENCOUNTER — Ambulatory Visit (HOSPITAL_COMMUNITY)
Admission: RE | Admit: 2013-09-14 | Discharge: 2013-09-14 | Disposition: A | Discharge status: Home / Self Care | Payer: Medicare HMO | Source: Ambulatory Visit | Attending: Family Medicine | Admitting: Family Medicine

## 2013-09-14 DIAGNOSIS — Z1231 Encounter for screening mammogram for malignant neoplasm of breast: Secondary | ICD-10-CM | POA: Insufficient documentation

## 2013-10-09 ENCOUNTER — Other Ambulatory Visit: Payer: Self-pay

## 2013-10-09 DIAGNOSIS — E1165 Type 2 diabetes mellitus with hyperglycemia: Principal | ICD-10-CM

## 2013-10-09 DIAGNOSIS — IMO0001 Reserved for inherently not codable concepts without codable children: Secondary | ICD-10-CM

## 2013-10-09 DIAGNOSIS — Z794 Long term (current) use of insulin: Principal | ICD-10-CM

## 2013-10-25 ENCOUNTER — Telehealth: Payer: Self-pay | Admitting: *Deleted

## 2013-10-25 NOTE — Telephone Encounter (Signed)
Patient was concerned he blood pressures having been running [phone]/ sytolic,HR low 38'V Even her blood sugars are lower.She is now seeing an endocrinologist. She is going to keep a BP log to see how blood pressures are trending and will call back and report

## 2013-10-25 NOTE — Telephone Encounter (Signed)
Pt needs to ask nurse about her medication she is on

## 2013-10-27 NOTE — Assessment & Plan Note (Signed)
Annual exam as documented. Counseling done  re healthy lifestyle involving commitment to 150 minutes exercise per week, heart healthy diet, and attaining healthy weight.The importance of adequate sleep also discussed. Regular seat belt use and safe storage  of firearms if patient has them, is also discussed. Changes in health habits are decided on by the patient with goals and time frames  set for achieving them. Immunization and cancer screening needs are specifically addressed at this visit.  

## 2013-10-27 NOTE — Assessment & Plan Note (Signed)
Pt needs referrla for annual f/u at [facility]  And this is entered, due in August

## 2013-10-27 NOTE — Assessment & Plan Note (Signed)
Referred for annual f/u at [facility]

## 2013-11-07 ENCOUNTER — Other Ambulatory Visit: Payer: Self-pay

## 2013-11-07 ENCOUNTER — Ambulatory Visit: Payer: Medicare HMO | Admitting: Podiatry

## 2013-11-07 MED ORDER — NYSTATIN 100000 UNIT/GM EX POWD: CUTANEOUS | Status: DC

## 2013-11-19 ENCOUNTER — Ambulatory Visit (INDEPENDENT_AMBULATORY_CARE_PROVIDER_SITE_OTHER): Payer: Medicare HMO | Admitting: Podiatry

## 2013-11-19 ENCOUNTER — Encounter: Payer: Self-pay | Admitting: Podiatry

## 2013-11-19 VITALS — BP 155/76 | HR 85 | Resp 15 | Ht 61.5 in | Wt 168.0 lb

## 2013-11-19 DIAGNOSIS — M79676 Pain in unspecified toe(s): Secondary | ICD-10-CM

## 2013-11-19 DIAGNOSIS — B351 Tinea unguium: Secondary | ICD-10-CM

## 2013-11-19 DIAGNOSIS — M79609 Pain in unspecified limb: Secondary | ICD-10-CM

## 2013-11-19 NOTE — Patient Instructions (Signed)
Diabetes and Foot Care Diabetes may cause you to have problems because of poor blood supply (circulation) to your feet and legs. This may cause the skin on your feet to become thinner, break easier, and heal more slowly. Your skin may become dry, and the skin may peel and crack. You may also have nerve damage in your legs and feet causing decreased feeling in them. You may not notice minor injuries to your feet that could lead to infections or more serious problems. Taking care of your feet is one of the most important things you can do for yourself.  HOME CARE INSTRUCTIONS  Wear shoes at all times, even in the house. Do not go barefoot. Bare feet are easily injured.  Check your feet daily for blisters, cuts, and redness. If you cannot see the bottom of your feet, use a mirror or ask someone for help.  Wash your feet with warm water (do not use hot water) and mild soap. Then pat your feet and the areas between your toes until they are completely dry. Do not soak your feet as this can dry your skin.  Apply a moisturizing lotion or petroleum jelly (that does not contain alcohol and is unscented) to the skin on your feet and to dry, brittle toenails. Do not apply lotion between your toes.  Trim your toenails straight across. Do not dig under them or around the cuticle. File the edges of your nails with an emery board or nail file.  Do not cut corns or calluses or try to remove them with medicine.  Wear clean socks or stockings every day. Make sure they are not too tight. Do not wear knee-high stockings since they may decrease blood flow to your legs.  Wear shoes that fit properly and have enough cushioning. To break in new shoes, wear them for just a few hours a day. This prevents you from injuring your feet. Always look in your shoes before you put them on to be sure there are no objects inside.  Do not cross your legs. This may decrease the blood flow to your feet.  If you find a minor scrape,  cut, or break in the skin on your feet, keep it and the skin around it clean and dry. These areas may be cleansed with mild soap and water. Do not cleanse the area with peroxide, alcohol, or iodine.  When you remove an adhesive bandage, be sure not to damage the skin around it.  If you have a wound, look at it several times a day to make sure it is healing.  Do not use heating pads or hot water bottles. They may burn your skin. If you have lost feeling in your feet or legs, you may not know it is happening until it is too late.  Make sure your health care provider performs a complete foot exam at least annually or more often if you have foot problems. Report any cuts, sores, or bruises to your health care provider immediately. SEEK MEDICAL CARE IF:   You have an injury that is not healing.  You have cuts or breaks in the skin.  You have an ingrown nail.  You notice redness on your legs or feet.  You feel burning or tingling in your legs or feet.  You have pain or cramps in your legs and feet.  Your legs or feet are numb.  Your feet always feel cold. SEEK IMMEDIATE MEDICAL CARE IF:   There is increasing redness,   swelling, or pain in or around a wound.  There is a red line that goes up your leg.  Pus is coming from a wound.  You develop a fever or as directed by your health care provider.  You notice a bad smell coming from an ulcer or wound. Document Released: 03/19/2000 Document Revised: 11/22/2012 Document Reviewed: 08/29/2012 ExitCare Patient Information 2015 [employer]. This information is not intended to replace advice given to you by your health care provider. Make sure you discuss any questions you have with your health care provider.  

## 2013-11-19 NOTE — Progress Notes (Signed)
   Subjective:    Patient ID: Christine Dominguez, female    DOB: Aug 30, 1950, 63 y.o.   MRN: [id number]  HPI Comments: N debridement L B/L 5th plantar MPJ and 10 toenails D and O long-term C hard skin and elongated, thick toenails A diabetes and difficulty cutting T Dr. Moshe Cipro recommended podiatric debridment     Review of Systems  HENT: Positive for tinnitus.   Musculoskeletal: Positive for back pain.  Neurological: Positive for light-headedness.  All other systems reviewed and are negative.      Objective:   Physical Exam  Orientated x3 black female  Vascular: DP and PT pulses 2/4 bilaterally  Neurological: Ankle reflexes equal and reactive bilaterally Vibratory sensation intact bilaterally Sensation to 10 g monofilament wire intact 5/5 bilaterally  Dermatological: The toenails are elongated, discolored, hypertrophic and incurvated 6-10 Keratoses fifth MPJ bilaterally  Musculoskeletal: Pes planus bilaterally Adductovarus third right toe Flail fifth toes bilaterally postop foot surgery      Assessment & Plan:   Assessment: Satisfactory neurovascular status Onychomycoses with symptoms 6-10 Keratoses fifth MPJ bilaterally Hammertoe deformity third right  Plan: Nails x10 are debrided down to bleeding Minimal keratoses debrided fifth right MPJ  Diabetic footcare information provided Reappoint at three-month intervals

## 2013-11-23 ENCOUNTER — Telehealth: Payer: Self-pay | Admitting: Cardiology

## 2013-11-23 NOTE — Telephone Encounter (Signed)
Patient would like to speak with nurse regarding her BP meds / tgs

## 2013-11-23 NOTE — Telephone Encounter (Signed)
Pt had stopped Divan for several days and her BP was 135/60. She wondered if she could stay off Diovan permanently. She said her pcp tod her the same thing.Pt agrees to stay on medication

## 2013-12-11 ENCOUNTER — Telehealth: Payer: Self-pay | Admitting: Family Medicine

## 2013-12-11 NOTE — Telephone Encounter (Signed)
Pt coming in the am for OV

## 2013-12-12 ENCOUNTER — Encounter: Payer: Self-pay | Admitting: Family Medicine

## 2013-12-12 ENCOUNTER — Ambulatory Visit (INDEPENDENT_AMBULATORY_CARE_PROVIDER_SITE_OTHER): Payer: Medicare HMO | Admitting: Family Medicine

## 2013-12-12 VITALS — BP 160/80 | HR 89 | Temp 99.0°F | Resp 16 | Ht 61.5 in | Wt 164.0 lb

## 2013-12-12 DIAGNOSIS — IMO0002 Reserved for concepts with insufficient information to code with codable children: Secondary | ICD-10-CM

## 2013-12-12 DIAGNOSIS — IMO0001 Reserved for inherently not codable concepts without codable children: Secondary | ICD-10-CM

## 2013-12-12 DIAGNOSIS — D447 Neoplasm of uncertain behavior of aortic body and other paraganglia: Secondary | ICD-10-CM

## 2013-12-12 DIAGNOSIS — I10 Essential (primary) hypertension: Secondary | ICD-10-CM

## 2013-12-12 DIAGNOSIS — E1165 Type 2 diabetes mellitus with hyperglycemia: Secondary | ICD-10-CM

## 2013-12-12 DIAGNOSIS — K219 Gastro-esophageal reflux disease without esophagitis: Secondary | ICD-10-CM

## 2013-12-12 DIAGNOSIS — E785 Hyperlipidemia, unspecified: Secondary | ICD-10-CM

## 2013-12-12 DIAGNOSIS — J209 Acute bronchitis, unspecified: Secondary | ICD-10-CM | POA: Insufficient documentation

## 2013-12-12 DIAGNOSIS — E1065 Type 1 diabetes mellitus with hyperglycemia: Secondary | ICD-10-CM

## 2013-12-12 DIAGNOSIS — Z794 Long term (current) use of insulin: Secondary | ICD-10-CM

## 2013-12-12 MED ORDER — AZITHROMYCIN 250 MG PO TABS: ORAL_TABLET | ORAL | Status: DC

## 2013-12-12 NOTE — Patient Instructions (Signed)
F/u as before  You are treated for acute bronchitis and antibiotic is sent in  Blood pressure is elevated today, so please make sure that you take medications as prescribed  Foot exam is good  Fasting lipid and hepatic panel needed , past due  Miicroalb today

## 2013-12-14 LAB — MICROALBUMIN / CREATININE URINE RATIO
Creatinine, Urine: 71.5 mg/dL
Microalb Creat Ratio: 7 mg/g (ref 0.0–30.0)
Microalb, Ur: 0.5 mg/dL (ref 0.00–1.89)

## 2013-12-16 NOTE — Assessment & Plan Note (Signed)
Antibiotic prescribed 

## 2013-12-16 NOTE — Progress Notes (Signed)
   Subjective:    Patient ID: Christine Dominguez, female    DOB: 06/30/1950, 63 y.o.   MRN: [id number]  HPI 4 day h/o excessive cough and head congestion, has increased nasal drainage and sputum production which is yellow. 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 Denies chest pains, palpitations and leg swelling Denies abdominal pain, nausea, vomiting,diarrhea or constipation.   Denies dysuria, frequency, hesitancy or incontinence. Denies joint pain, swelling and limitation in mobility. Denies headaches, seizures, numbness, or tingling. Denies depression, anxiety or insomnia. Denies skin break down or rash.        Objective:   Physical Exam BP 160/80  Pulse 89  Temp(Src) 99 F (37.2 C) (Oral)  Resp 16  Ht 5' 1.5" (1.562 m)  Wt 164 lb (74.39 kg)  BMI 30.49 kg/m2  SpO2 98% Patient alert and oriented and in no cardiopulmonary distress.Ill appearing  HEENT: No facial asymmetry, EOMI,   oropharynx pink and moist.  Neck supple no JVD, no mass.  Chest: decreased though adequate air entry , scattered crackles no wheezes  CVS: S1, S2 no murmurs, no S3.Regular rate.  ABD: Soft non tender.   Ext: No edema  MS: Adequate ROM spine, shoulders, hips and knees.  Skin: Intact, no ulcerations or rash noted.  Psych: Good eye contact, normal affect. Memory intact not anxious or depressed appearing.  CNS: CN 2-12 intact, power,  normal throughout.no focal deficits noted.        Assessment & Plan:  Acute bronchitis Antibiotic prescribed   Essential hypertension, benign Uncontrolled DASH diet and commitment to daily physical activity for a minimum of 30 minutes discussed and encouraged, as a part of hypertension management. The importance of attaining a healthy weight is also  discussed. States BP is normal at other  Offices including cardiology  Hyperlipidemia with target LDL less than 130 Updated lab needed at/ before next visit. Hyperlipidemia:Low fat diet discussed and encouraged.    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

## 2013-12-16 NOTE — Assessment & Plan Note (Signed)
Has had annual eval at [facility], no call back from imaging study and no new treatment

## 2013-12-16 NOTE — Assessment & Plan Note (Signed)
Updated lab needed at/ before next visit. Hyperlipidemia:Low fat diet discussed and encouraged.   

## 2013-12-16 NOTE — Assessment & Plan Note (Signed)
Uncontrolled DASH diet and commitment to daily physical activity for a minimum of 30 minutes discussed and encouraged, as a part of hypertension management. The importance of attaining a healthy weight is also discussed. States BP is normal at other  Offices including cardiology

## 2013-12-16 NOTE — Assessment & Plan Note (Signed)
Controlled, no change in medication  

## 2013-12-31 ENCOUNTER — Telehealth: Payer: Self-pay | Admitting: Adult Health

## 2013-12-31 ENCOUNTER — Encounter: Payer: Self-pay | Admitting: Adult Health

## 2013-12-31 ENCOUNTER — Ambulatory Visit (HOSPITAL_COMMUNITY)
Admission: RE | Admit: 2013-12-31 | Discharge: 2013-12-31 | Disposition: A | Discharge status: Home / Self Care | Payer: Medicare HMO | Source: Ambulatory Visit | Attending: Adult Health | Admitting: Adult Health

## 2013-12-31 ENCOUNTER — Ambulatory Visit (INDEPENDENT_AMBULATORY_CARE_PROVIDER_SITE_OTHER): Payer: Commercial Managed Care - HMO | Admitting: Adult Health

## 2013-12-31 VITALS — BP 190/88 | HR 88 | Ht 61.25 in | Wt 163.5 lb

## 2013-12-31 DIAGNOSIS — Z01419 Encounter for gynecological examination (general) (routine) without abnormal findings: Secondary | ICD-10-CM

## 2013-12-31 DIAGNOSIS — R0989 Other specified symptoms and signs involving the circulatory and respiratory systems: Secondary | ICD-10-CM | POA: Diagnosis not present

## 2013-12-31 DIAGNOSIS — Z1212 Encounter for screening for malignant neoplasm of rectum: Secondary | ICD-10-CM

## 2013-12-31 DIAGNOSIS — N816 Rectocele: Secondary | ICD-10-CM | POA: Insufficient documentation

## 2013-12-31 DIAGNOSIS — Z124 Encounter for screening for malignant neoplasm of cervix: Secondary | ICD-10-CM

## 2013-12-31 LAB — HEMOCCULT GUIAC POC 1CARD (OFFICE): FECAL OCCULT BLD: NEGATIVE

## 2013-12-31 NOTE — Patient Instructions (Signed)
Physical in 1 year Mammogram yearly labs with PCP Get chest x ray

## 2013-12-31 NOTE — Telephone Encounter (Signed)
Pt aware chest xray negative

## 2013-12-31 NOTE — Progress Notes (Signed)
Patient ID: Christine Dominguez, female   DOB: 11-Aug-1950, 63 y.o.   MRN: [id number] History of Present Illness: Jax is a 63 year old black female, widowed in for a gyn physical.She had a normal pap with negative HPV 01/02/13.She has had URI recently and was treated with antibiotics by Dr Moshe Cipro.And she had a tooth pulled last week.She has not taken her BP meds for today, usually takes mid day.   Current Medications, Allergies, Past Medical History, Past Surgical History, Family History and Social History were reviewed in [employer] record.     Review of Systems: Patient denies any headaches, blurred vision, shortness of breath, chest pain, abdominal pain, problems with bowel movements, urination, or intercourse. No having sex, no joint pain or mood swings, she is still coughing some and sees Dr Moshe Cipro in about 2 weeks.    Physical Exam:BP 190/88  Pulse 88  Ht 5' 1.25" (1.556 m)  Wt 163 lb 8 oz (74.163 kg)  BMI 30.63 kg/m2 General:  Well developed, well nourished, no acute distress Skin:  Warm and dry Neck:  Midline trachea, normal thyroid Lungs; has crackles and congestion in bilateral upper to mid lobes, no wheezing noted  Breast:  No dominant palpable mass, retraction, or nipple discharge Cardiovascular: Regular rate and rhythm Abdomen:  Soft, non tender, no hepatosplenomegaly Pelvic:  External genitalia is normal in appearance, no lesions.  The vagina has decrease color, moisture and rugae.  The cervix is bulbous.  Uterus is felt to be normal size, shape, and contour.  No     adnexal masses or tenderness noted. Rectal: Good sphincter tone, no polyps, or hemorrhoids felt.  Hemoccult negative.+ rectocele  Extremities:  No swelling or varicosities noted Psych:  No mood changes, alert and cooperative,seems happy   Impression: Yearly gyn exam no pap Crackles and congestion in upper lobes of lungs Rectocele    Plan: Get chest x ray now Physical  In  1 year Mammogram yearly Labs with PCP  Will talk this afternoon about chest x ray

## 2014-01-04 ENCOUNTER — Other Ambulatory Visit: Payer: Self-pay

## 2014-01-04 MED ORDER — GABAPENTIN 100 MG PO CAPS
ORAL_CAPSULE | ORAL | Status: DC
Start: 2014-01-04 — End: 2014-05-15

## 2014-01-04 MED ORDER — NYSTATIN 100000 UNIT/GM EX POWD: CUTANEOUS | Status: DC

## 2014-01-08 LAB — LIPID PANEL
CHOL/HDL RATIO: 2.6 ratio
CHOLESTEROL: 103 mg/dL (ref 0–200)
HDL: 40 mg/dL (ref >39)
LDL Cholesterol: 42 mg/dL (ref 0–99)
Triglycerides: 103 mg/dL (ref <150)
VLDL: 21 mg/dL (ref 0–40)

## 2014-01-08 LAB — HEPATIC FUNCTION PANEL
ALT: 8 U/L (ref 0–35)
AST: 11 U/L (ref 0–37)
Albumin: 3.9 g/dL (ref 3.5–5.2)
Alkaline Phosphatase: 68 U/L (ref 39–117)
BILIRUBIN DIRECT: 0.1 mg/dL (ref 0.0–0.3)
Indirect Bilirubin: 0.2 mg/dL (ref 0.2–1.2)
Total Bilirubin: 0.3 mg/dL (ref 0.2–1.2)
Total Protein: 7.5 g/dL (ref 6.0–8.3)

## 2014-01-09 ENCOUNTER — Telehealth: Payer: Self-pay

## 2014-01-09 LAB — HEMOGLOBIN A1C: A1c: 8.8

## 2014-01-09 NOTE — Telephone Encounter (Signed)
Rechecked her sugar and it was 187. Patient feeling fine

## 2014-01-09 NOTE — Telephone Encounter (Signed)
Patient states that she checked her sugar at 1:00 at it was 124 which is low for her so she drank some juice and ate a banana and checked it 30 mins later and it was 379. She tried to call her Endo, Dr Dorris Fetch but couldn't get him. I told her since she checked her sugar right after she had drank the juice to drank water and go for a walk and check her sugar again at 3:30 and call me back. If it was still high, I told her I would call Dr Dorris Fetch for her.

## 2014-01-17 ENCOUNTER — Encounter: Payer: Self-pay | Admitting: Family Medicine

## 2014-01-17 ENCOUNTER — Ambulatory Visit: Payer: Medicare HMO | Admitting: Family Medicine

## 2014-01-17 ENCOUNTER — Ambulatory Visit (INDEPENDENT_AMBULATORY_CARE_PROVIDER_SITE_OTHER): Payer: Medicare HMO | Admitting: Family Medicine

## 2014-01-17 VITALS — BP 160/86 | HR 82 | Resp 16 | Ht 61.0 in | Wt 165.0 lb

## 2014-01-17 DIAGNOSIS — Z794 Long term (current) use of insulin: Secondary | ICD-10-CM

## 2014-01-17 DIAGNOSIS — E1165 Type 2 diabetes mellitus with hyperglycemia: Secondary | ICD-10-CM

## 2014-01-17 DIAGNOSIS — E785 Hyperlipidemia, unspecified: Secondary | ICD-10-CM

## 2014-01-17 DIAGNOSIS — M5442 Lumbago with sciatica, left side: Secondary | ICD-10-CM

## 2014-01-17 DIAGNOSIS — Z23 Encounter for immunization: Secondary | ICD-10-CM

## 2014-01-17 DIAGNOSIS — M5440 Lumbago with sciatica, unspecified side: Secondary | ICD-10-CM | POA: Insufficient documentation

## 2014-01-17 DIAGNOSIS — I10 Essential (primary) hypertension: Secondary | ICD-10-CM

## 2014-01-17 DIAGNOSIS — IMO0001 Reserved for inherently not codable concepts without codable children: Secondary | ICD-10-CM

## 2014-01-17 DIAGNOSIS — E038 Other specified hypothyroidism: Secondary | ICD-10-CM

## 2014-01-17 DIAGNOSIS — E1065 Type 1 diabetes mellitus with hyperglycemia: Secondary | ICD-10-CM

## 2014-01-17 MED ORDER — PREDNISONE 5 MG PO TABS: 5.0000 mg | ORAL_TABLET | Freq: Two times a day (BID) | ORAL | Status: AC

## 2014-01-17 MED ORDER — METHYLPREDNISOLONE ACETATE 80 MG/ML IJ SUSP
80.0000 mg | Freq: Once | INTRAMUSCULAR | Status: AC
  Administered 2014-01-17: 80 mg via INTRAMUSCULAR

## 2014-01-17 MED ORDER — MELOXICAM 15 MG PO TABS: ORAL_TABLET | ORAL | Status: DC

## 2014-01-17 NOTE — Patient Instructions (Addendum)
Annual wellness first week in May, call if you need me before  Depo medrol in office for acute back[pain and meloxicam and prednisone prescribed for 5 days only  Be careful about mopping and lifting  Flu vaccine today  Blood pressure renmains high in the office, I leave the cardiologist to work with you an that as we have in the past, since you reprot normal pressures at home all the time

## 2014-01-19 DIAGNOSIS — Z23 Encounter for immunization: Secondary | ICD-10-CM | POA: Insufficient documentation

## 2014-01-19 NOTE — Assessment & Plan Note (Signed)
Controlled, no change in medication Managed by endo 

## 2014-01-19 NOTE — Assessment & Plan Note (Signed)
Uncontrolled, however pt re[ports normal BP's at home. Has head several episodes of light headedness in the past , and is being specifically managed by cardiology for management of her HTN DASH diet and commitment to daily physical activity for a minimum of 30 minutes discussed and encouraged, as a part of hypertension management. The importance of attaining a healthy weight is also discussed. med management is per cardiology

## 2014-01-19 NOTE — Assessment & Plan Note (Signed)
Managed by endo , improving  Patient advised to reduce carb and sweets, commit to regular physical activity, take meds as prescribed, test blood as directed, and attempt to lose weight, to improve blood sugar control.

## 2014-01-19 NOTE — Progress Notes (Signed)
   Subjective:    Patient ID: Christine Dominguez, female    DOB: [birth year], 63 y.o.   MRN: [id number]  HPI The PT is here for follow up and re-evaluation of chronic medical conditions, medication management and review of any available recent lab and radiology data.  Preventive health is updated, specifically  Cancer screening and Immunization.   Questions or concerns regarding consultations or procedures which the PT has had in the interim are  addressed. The PT denies any adverse reactions to current medications since the last visit.  Hurt her back this past weekend when mopping, increased low back pain radiating down left hip and lower extremity States blood sugars much improved, denies polyuria, polydipsia or hypoglycemic episodes, followed by endo for this as well as thyroid disease     Review of Systems See HPI Denies recent fever or chills. Denies sinus pressure, nasal congestion, ear pain or sore throat. Denies chest congestion, productive cough or wheezing. Denies chest pains, palpitations and leg swelling Denies abdominal pain, nausea, vomiting,diarrhea or constipation.   Denies dysuria, frequency, hesitancy or incontinence. DDenies headaches, seizures, numbness, or tingling. Denies depression, anxiety or insomnia. Denies skin break down or rash.        Objective:   Physical Exam  BP 160/86  Pulse 82  Resp 16  Ht 5\' 1"  (1.549 m)  Wt 165 lb (74.844 kg)  BMI 31.19 kg/m2  SpO2 97% Patient alert and oriented and in no cardiopulmonary distress.Pt in pain  HEENT: No facial asymmetry, EOMI,   oropharynx pink and moist.  Neck supple no JVD, no mass.  Chest: Clear to auscultation bilaterally.  CVS: S1, S2 no murmurs, no S3.Regular rate.  ABD: Soft non tender.   Ext: No edema  MS: Decreased  ROM lumbar  spine, and left hip, otherwise normal exam  Skin: Intact, no ulcerations or rash noted.  Psych: Good eye contact, normal affect. Memory intact not anxious  or depressed appearing.  CNS: CN 2-12 intact, power,  normal throughout.no focal deficits noted.       Assessment & Plan:  Acute back pain with sciatica Acute back pain uncontrolled, depo medrol in office followed by oral meds Pt to call back for PT if feels she needs this Care with activities like bending, mopping etc discussed  Essential hypertension, benign Uncontrolled, however pt re[ports normal BP's at home. Has head several episodes of light headedness in the past , and is being specifically managed by cardiology for management of her HTN DASH diet and commitment to daily physical activity for a minimum of 30 minutes discussed and encouraged, as a part of hypertension management. The importance of attaining a healthy weight is also discussed. med management is per cardiology  Diabetes mellitus, insulin dependent (IDDM), uncontrolled Managed by endo , improving  Patient advised to reduce carb and sweets, commit to regular physical activity, take meds as prescribed, test blood as directed, and attempt to lose weight, to improve blood sugar control.    Hyperlipidemia with target LDL less than 130 Controlled, no change in medication Hyperlipidemia:Low fat diet discussed and encouraged.    Hypothyroid Controlled, no change in medication Managed by endo  Need for prophylactic vaccination and inoculation against influenza Vaccine administered at visit.

## 2014-01-19 NOTE — Assessment & Plan Note (Signed)
Acute back pain uncontrolled, depo medrol in office followed by oral meds Pt to call back for PT if feels she needs this Care with activities like bending, mopping etc discussed

## 2014-01-19 NOTE — Assessment & Plan Note (Signed)
Vaccine administered at visit.  

## 2014-01-19 NOTE — Assessment & Plan Note (Signed)
Controlled, no change in medication Hyperlipidemia:Low fat diet discussed and encouraged.  \ 

## 2014-01-29 ENCOUNTER — Encounter: Payer: Self-pay | Admitting: Cardiology

## 2014-01-29 ENCOUNTER — Ambulatory Visit (INDEPENDENT_AMBULATORY_CARE_PROVIDER_SITE_OTHER): Payer: Medicare HMO | Admitting: Cardiology

## 2014-01-29 VITALS — BP 152/82 | HR 89 | Ht 61.0 in | Wt 162.0 lb

## 2014-01-29 DIAGNOSIS — I10 Essential (primary) hypertension: Secondary | ICD-10-CM

## 2014-01-29 DIAGNOSIS — E785 Hyperlipidemia, unspecified: Secondary | ICD-10-CM

## 2014-01-29 NOTE — Assessment & Plan Note (Signed)
Blood pressure elevated today, although in the setting of back pain. Home blood pressure checks have been more reasonable, and therefore will not make any medication adjustments today. Keep follow-up with Dr. Moshe Cipro.

## 2014-01-29 NOTE — Patient Instructions (Signed)
Your physician wants you to follow-up in: 6 months You will receive a reminder letter in the mail two months in advance. If you don't receive a letter, please call our office to schedule the follow-up appointment.     Your physician recommends that you continue on your current medications as directed. Please refer to the Current Medication list given to you today.      Thank you for choosing [facility] !        

## 2014-01-29 NOTE — Progress Notes (Signed)
Reason for visit: Hypertension  Clinical Summary Christine Dominguez is a 63 y.o.female last seen in April 2015. She has had some recent problems with lower back pain, thinks that she may have strained it doing some sweeping and other chores. She just saw Dr. Moshe Dominguez recently for this. Systolic blood pressure has typically been running in the 120s to 130s by her checks, more elevated today in association with back pain. She reports compliance with Diovan, HCTZ, and clonidine.  Recent lab work showed cholesterol 103, triglycerides 103, HDL 40, LDL 42, normal LFTs, hemoglobin A1c 8.8. She has been on steroids with her back pain, states blood sugars have been elevated.  Continues to enjoy walking for exercise. She denies any exertional chest pain, has NYHA class II dyspnea. No palpitations or syncope.   Allergies  Allergen Reactions  . Amaryl   . Aspirin Nausea Only    More of a burning feeling when she take it Burning in stomach, nausea  . Avandia [Rosiglitazone Maleate]   . Glimepiride Itching  . Glipizide Itching  . Invokana [Canagliflozin]   . Novolog [Insulin Aspart]     Current Outpatient Prescriptions  Medication Sig Dispense Refill  . aspirin EC 81 MG tablet Take 81 mg by mouth at bedtime.      . Camphor-Eucalyptus-Menthol (MEDICATED CHEST RUB) 4.73-1.2-2.6 % OINT       . cloNIDine (CATAPRES) 0.1 MG tablet Take 1 tablet (0.1 mg total) by mouth daily.  180 tablet  1  . docusate sodium (COLACE) 100 MG capsule Take 100 mg by mouth every other day.       . gabapentin (NEURONTIN) 100 MG capsule TAKE 1 CAPSULE TWICE DAILY  180 capsule  1  . glucose (SUNMARK GLUCOSE) 4 GM chewable tablet Chew by mouth.      . hydrochlorothiazide (HYDRODIURIL) 25 MG tablet Take 0.5 tablets (12.5 mg total) by mouth daily.  90 tablet  3  . insulin glargine (LANTUS SOLOSTAR) 100 UNIT/ML injection Inject 25 Units into the skin at bedtime.       . Iron 66 MG TABS Take 1 tablet by mouth 2 (two) times  daily.       Marland Kitchen levothyroxine (SYNTHROID, LEVOTHROID) 50 MCG tablet Take 50 mcg by mouth daily before breakfast.      . linagliptin (TRADJENTA) 5 MG TABS tablet Take 5 mg by mouth daily.      . meloxicam (MOBIC) 15 MG tablet One tablet once daily for 5 days only  5 tablet  0  . nystatin (MYCOSTATIN) powder APPLY AT BEDTIME TO RASH ON LOWER ABDOMEN FOR AS NEEDED.  120 g  1  . omeprazole (PRILOSEC) 40 MG capsule TAKE (1) CAPSULE BY MOUTH ONCE DAILY  90 capsule  1  . Simethicone (GAS RELIEF DROPS PO) Take by mouth as needed.      . simvastatin (ZOCOR) 20 MG tablet Take 20 mg by mouth at bedtime.        . sodium chloride (OCEAN) 0.65 % nasal spray       . tiZANidine (ZANAFLEX) 2 MG tablet Take 1 tablet (2 mg total) by mouth daily as needed for muscle spasms.  30 tablet  0  . valsartan (DIOVAN) 160 MG tablet TAKE 1 TABLET EVERY DAY  90 tablet  3   No current facility-administered medications for this visit.    Past Medical History  Diagnosis Date  . Chronic back pain 2007    Disabled   . Obesity   .  Hydrocephalus     Treated with VP shunt and then intracranial tumor resection (?Cholesteatoma); no longer followed actively by neurosurgery or otolaryngology  . Type 2 diabetes mellitus 1991  . Essential hypertension, benign 1980  . GERD (gastroesophageal reflux disease) 2000  . Hypothyroidism 2013  . Hyperlipidemia 2013  . Arthritis     Cervical spine  . Neuropathy   . Anemia   . Cancer 1998, recurred in 2012    Paraganglioma of jugular tympamicum, recurrent in 2012    Social History Christine Dominguez reports that she quit smoking about 12 years ago. Her smoking use included Cigarettes. She has a 7.5 pack-year smoking history. She has never used smokeless tobacco. [name] reports that she does not drink alcohol.  Review of Systems Complete review of systems negative except as otherwise outlined in the clinical summary and also the following.  Physical  Examination Filed Vitals:   01/29/14 1055  BP: 152/82  Pulse: 89   Filed Weights   01/29/14 1055  Weight: 162 lb (73.483 kg)    Patient appears comfortable at rest.  HEENT: Conjunctiva normal, oropharynx clear with moist mucosa.  Neck: Supple, no carotid bruits, VP shunt on left side, no thyromegaly.  Lungs: Clear to auscultation, nonlabored breathing at rest.  Cardiac: Regular rate and rhythm, no S3 soft, systolic murmur, no pericardial rub.  Abdomen: Soft, nontender, bowel sounds present, no guarding or rebound.  Extremities: No pitting edema, distal pulses 2+.    Problem List and Plan   Essential hypertension, benign Blood pressure elevated today, although in the setting of back pain. Home blood pressure checks have been more reasonable, and therefore will not make any medication adjustments today. Keep follow-up with Dr. Moshe Dominguez.  Hyperlipidemia with target LDL less than 130 Continues on Zocor, LDL 42.    Christine Dominguez, M.D., F.A.C.C.

## 2014-01-29 NOTE — Assessment & Plan Note (Signed)
Continues on Zocor, LDL 42.

## 2014-01-30 ENCOUNTER — Encounter: Payer: Self-pay | Admitting: Family Medicine

## 2014-02-04 ENCOUNTER — Telehealth: Payer: Self-pay | Admitting: Family Medicine

## 2014-02-04 ENCOUNTER — Encounter: Payer: Self-pay | Admitting: Cardiology

## 2014-02-04 DIAGNOSIS — M5441 Lumbago with sciatica, right side: Secondary | ICD-10-CM

## 2014-02-04 DIAGNOSIS — M5442 Lumbago with sciatica, left side: Principal | ICD-10-CM

## 2014-02-04 NOTE — Telephone Encounter (Signed)
Back and buttocks are hurting real bad states she thinks she needs therphy at [facility] and also cramps in both feet and legs.

## 2014-02-05 NOTE — Telephone Encounter (Addendum)
Will check mri report, xray of her back was ordered at last visit she HAS nOT GOT that YET, nEEDS that

## 2014-02-05 NOTE — Telephone Encounter (Signed)
Is it ok to refer?

## 2014-02-06 NOTE — Telephone Encounter (Signed)
Called and left message for patient to return call.  

## 2014-02-11 ENCOUNTER — Ambulatory Visit (HOSPITAL_COMMUNITY)
Admission: RE | Admit: 2014-02-11 | Discharge: 2014-02-11 | Disposition: A | Discharge status: Home / Self Care | Payer: Medicare HMO | Source: Ambulatory Visit | Attending: Family Medicine | Admitting: Family Medicine

## 2014-02-11 DIAGNOSIS — M5137 Other intervertebral disc degeneration, lumbosacral region: Secondary | ICD-10-CM | POA: Diagnosis not present

## 2014-02-11 DIAGNOSIS — M5441 Lumbago with sciatica, right side: Secondary | ICD-10-CM

## 2014-02-11 DIAGNOSIS — M545 Low back pain: Secondary | ICD-10-CM | POA: Diagnosis present

## 2014-02-11 DIAGNOSIS — M5442 Lumbago with sciatica, left side: Secondary | ICD-10-CM

## 2014-02-11 NOTE — Addendum Note (Signed)
Addended by: Denman George B on: 02/11/2014 02:40 PM   Modules accepted: Orders

## 2014-02-11 NOTE — Telephone Encounter (Signed)
Patient will have xray done today.

## 2014-02-13 ENCOUNTER — Telehealth: Payer: Self-pay

## 2014-02-13 DIAGNOSIS — M5440 Lumbago with sciatica, unspecified side: Secondary | ICD-10-CM

## 2014-02-13 NOTE — Telephone Encounter (Signed)
Referral entered  

## 2014-02-13 NOTE — Telephone Encounter (Signed)
-----   Message from [name], MD sent at 02/12/2014 11:18 PM EST ----- pls let her know xray shows arthritis in her low back, no fracture. Pls refer her to  PT twice weekly for 6 weeks dx chronic back pain radiating to legs , let her  Know, and I will sign, tx

## 2014-02-20 ENCOUNTER — Encounter (HOSPITAL_COMMUNITY): Payer: Self-pay | Admitting: Physical Therapy

## 2014-02-20 ENCOUNTER — Ambulatory Visit (HOSPITAL_COMMUNITY)
Admission: RE | Admit: 2014-02-20 | Discharge: 2014-02-20 | Disposition: A | Discharge status: Home / Self Care | Payer: Commercial Managed Care - HMO | Source: Ambulatory Visit | Attending: Family Medicine | Admitting: Family Medicine

## 2014-02-20 DIAGNOSIS — M5442 Lumbago with sciatica, left side: Secondary | ICD-10-CM

## 2014-02-20 DIAGNOSIS — Z5189 Encounter for other specified aftercare: Secondary | ICD-10-CM | POA: Diagnosis present

## 2014-02-20 DIAGNOSIS — M6281 Muscle weakness (generalized): Secondary | ICD-10-CM | POA: Insufficient documentation

## 2014-02-20 DIAGNOSIS — R29898 Other symptoms and signs involving the musculoskeletal system: Secondary | ICD-10-CM

## 2014-02-20 DIAGNOSIS — M545 Low back pain: Secondary | ICD-10-CM | POA: Insufficient documentation

## 2014-02-20 DIAGNOSIS — R262 Difficulty in walking, not elsewhere classified: Secondary | ICD-10-CM | POA: Insufficient documentation

## 2014-02-20 NOTE — Patient Instructions (Signed)
Backward Bend (Standing)   Arch backward to make hollow of back deeper. Hold ____ seconds. Repeat ____ times per set. Do ____ sets per session. Do ____ sessions per day.  http://orth.exer.us/178   Copyright  VHI. All rights reserved.  Knee-to-Chest Stretch: Unilateral   With hand behind right knee, pull knee in to chest until a comfortable stretch is felt in lower back and buttocks. Keep back relaxed. Hold ____ seconds. Repeat ____ times per set. Do ____ sets per session. Do ____ sessions per day.     http://orth.exer.us/122  Adduction: Hip - Knees Together (Hook-Lying)   Lie with hips and knees bent, towel roll between knees. Push knees together. Hold for ___ seconds. Rest for ___ seconds. Repeat ___ times. Do ___ times a day.   Copyright  VHI. All rights reserved.  Press Up (Extension)   Place hands in a position for a half push-up. Press top half of body upward using arms. Let lower back sag. Hold ____ seconds. Lower body. Repeat ____ times. Do ____ sessions per day.  http://gt2.exer.us/246   Copyright  VHI. All rights reserved.   Copyright  VHI. All rights reserved.  Isometric Abdominal   Lying on back with knees bent, tighten stomach by pressing elbows down. Hold ____ seconds. Repeat ____ times per set. Do ____ sets per session. Do ____ sessions per day.  http://orth.exer.us/1086   Copyright  VHI. All rights reserved.  Bent Leg Lift (Hook-Lying)   Tighten stomach and slowly raise right leg ____ inches from floor. Keep trunk rigid. Hold ____ seconds. Repeat ____ times per set. Do ____ sets per session. Do ____ sessions per day.  http://orth.exer.us/1090   Copyright  VHI. All rights reserved.  Bridging   Slowly raise buttocks from floor, keeping stomach tight. Repeat ____ times per set. Do ____ sets per session. Do ____ sessions per day.  http://orth.exer.us/1096   Copyright  VHI. All rights reserved.  Heel Squeeze (Prone)   Abdomen  supported, bend knees and gently squeeze heels together. Hold ____ seconds. Repeat ____ times per set. Do ____ sets per session. Do ____ sessions per day.  http://orth.exer.us/1080   Copyright  VHI. All rights reserved.  Straight Leg Raise (Prone)   Abdomen and head supported, keep left knee locked and raise leg at hip. Avoid arching low back. Repeat ____ times per set. Do ____ sets per session. Do ____ sessions per day.  http://orth.exer.us/1112   Copyright  VHI. All rights reserved.  On Elbows (Prone)   Rise up on elbows as high as possible, keeping hips on floor. Hold ____ seconds. Repeat ____ times per set. Do ____ sets per session. Do ____ sessions per day.   http://orth.exer.us/92   Copyright  VHI. All rights reserved.

## 2014-02-20 NOTE — Therapy (Signed)
Physical Therapy Evaluation  Patient Details  Name: Christine Dominguez MRN: [id number] Date of Birth: [birth year]  Encounter Date: 02/20/2014      PT End of Session - 02/20/14 1613    Visit Number 1   Number of Visits 12   Date for PT Re-Evaluation 03/22/14   Authorization Type [employer]   PT Start Time 4268   PT Stop Time 1600   PT Time Calculation (min) 45 min   Activity Tolerance Patient tolerated treatment well      Past Medical History  Diagnosis Date  . Chronic back pain 2007    Disabled   . Obesity   . Hydrocephalus     Treated with VP shunt and then intracranial tumor resection (?Cholesteatoma); no longer followed actively by neurosurgery or otolaryngology  . Type 2 diabetes mellitus 1991  . Essential hypertension, benign 1980  . GERD (gastroesophageal reflux disease) 2000  . Hypothyroidism 2013  . Hyperlipidemia 2013  . Arthritis     Cervical spine  . Neuropathy   . Anemia   . Cancer 1998, recurred in 2012    Paraganglioma of jugular tympamicum, recurrent in 2012    Past Surgical History  Procedure Laterality Date  . Ventriculoperitoneal shunt  1998    At [facility]  . Intracranial mass resected  1998    At [facility] ? location-right middle ear  . Hemorrhoidectomy    . Trigger finger release      Rght thumb  . Tubal ligation  1980  . Colonoscopy  06/2010  . Tubal ligation    . Cataract extraction w/phaco Right 07/17/2013    Procedure: CATARACT EXTRACTION PHACO AND INTRAOCULAR LENS PLACEMENT (IOC);  Surgeon: [name], MD;  Location: [facility];  Service: Ophthalmology;  Laterality: Right;  CDE:7.24  . Teeth pulled      There were no vitals taken for this visit.  Visit Diagnosis:  No diagnosis found.      Subjective Assessment - 02/20/14 1641    Patient Stated Goals No pain;  to be able to complete her housework          [facility] PT Assessment - 02/20/14 1516    Assessment   Medical Diagnosis chronic low back pain   Onset Date 01/04/14   Prior  Therapy 2013   Precautions   Precautions None   Restrictions   Weight Bearing Restrictions No   Balance Screen   Has the patient fallen in the past 6 months No   Has the patient had a decrease in activity level because of a fear of falling?  No   Is the patient reluctant to leave their home because of a fear of falling?  No   Prior Function   Level of Independence Independent with basic ADLs   Leisure clean,    Observation/Other Assessments   Observations Rt ASIS low; Rt iliac crest high; Rt PSIS high   Focus on Therapeutic Outcomes (FOTO)  35   Posture/Postural Control   Posture/Postural Control No significant limitations   Posture Comments decreased kyphosis incrased lordosis   AROM   Lumbar Flexion wfl reps with increased pain with pulling self up    Lumbar Extension decreased 50% reps no change    Lumbar - Right Side Bend wnl   Lumbar - Left Side Bend wnl   Lumbar - Right Rotation decreased 20%   Lumbar - Left Rotation decreased 40%   Strength   Right Hip Extension 4/5   Right Hip ABduction 4/5  Left Hip Flexion 4/5   Left Hip Extension 3/5   Left Hip ABduction 4/5   Right Knee Flexion 5/5   Right Knee Extension 5/5   Left Knee Flexion 3+/5   Left Knee Extension 3+/5   Right Ankle Dorsiflexion --  4-/5   Left Ankle Dorsiflexion 4/5          [facility] Adult PT Treatment/Exercise - February 27, 2014 1516    Exercises   Exercises Lumbar   Lumbar Exercises: Stretches   Prone on Elbows Stretch 60 seconds   Press Ups 5 reps   Lumbar Exercises: Supine   Ab Set 10 reps   Bridge 10 reps   Other Supine Lumbar Exercises hip adduction isometic x 10   Manual Therapy   Manual Therapy Joint mobilization   Joint Mobilization SI adjustment            PT Long Term Goals - 2014-02-27 1633    PT LONG TERM GOAL #1   Title I in advance HEP   Time 6   Period Weeks   PT LONG TERM GOAL #2   Title Pt to be able to stand for 45 minutes without pain for shopping    Time 6   Period  Weeks   PT LONG TERM GOAL #3   Title Pt Lt leg strength to be increased by one grade to allow the above to occur   Time 6   Period Weeks   PT LONG TERM GOAL #4   Title No radicular pain   Time 6   Period Weeks   PT LONG TERM GOAL #5   Title Pain level in back no higher than a  3/10 80 % of the day    Time 6   Period Weeks          Plan - 27-Feb-2014 1618    Clinical Impression Statement Christine Dominguez has had chronic low back pain that has been aggrevated for the past six weeks from housework.  She has been referred for therapy; examination demonstrates decreased ROM, decreased core and LE strength, pelvic malalignment and increased pain .  Christine Dominguez will benefit from skilled PT to address these issues and maximize her functional ability to imporve her quality of life.    Pt will benefit from skilled therapeutic intervention in order to improve on the following deficits Decreased activity tolerance;Decreased range of motion;Decreased strength;Difficulty walking;Pain   Rehab Potential Good   PT Frequency 2x / week   PT Duration 6 weeks   PT Treatment/Interventions Manual techniques;Therapeutic exercise;Therapeutic activities;Patient/family education   PT Next Visit Plan Pt to begin supine bent knee lift, prone heel squeeze, prone SLR; standing heel lift and functional squat.   Recheck pt SI .  Progress to standing stab exercise to mimic mopping and vaccuming tasks.    PT Home Exercise Plan given   Consulted and Agree with Plan of Care Patient          G-Codes - 02-27-14 1627    Functional Assessment Tool Used foto   Functional Limitation Mobility: Walking and moving around   Mobility: Walking and Moving Around Current Status [phone]) At least 60 percent but less than 80 percent impaired, limited or restricted   Mobility: Walking and Moving Around Goal Status [phone]) At least 20 percent but less than 40 percent impaired, limited or restricted      Problem List Patient Active  Problem List   Diagnosis Date Noted  . Need for prophylactic vaccination and inoculation  against influenza 01/19/2014  . Acute back pain with sciatica 01/17/2014  . Rectocele 12/31/2013  . Paraganglioma 08/01/2013  . Routine general medical examination at a health care facility 05/19/2012  . 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 Hypothyroid 11/14/2011  . Hyperlipidemia with target LDL less than 130 11/14/2011  . GERD (gastroesophageal reflux disease) 11/03/2011  . Malignant paraganglioma 07/13/2011  . Hearing loss in right ear 11/24/2010  . Diabetes mellitus, insulin dependent (IDDM), uncontrolled 06/11/2010  . Backache 11/18/2009  . CARPAL TUNNEL SYNDROME 10/19/2009  . Essential hypertension, benign 10/13/2009      Azucena Freed PT/CLT [phone]   02/20/2014, 4:43 PM

## 2014-02-25 ENCOUNTER — Encounter: Payer: Self-pay | Admitting: Podiatry

## 2014-02-25 ENCOUNTER — Ambulatory Visit (INDEPENDENT_AMBULATORY_CARE_PROVIDER_SITE_OTHER): Payer: Commercial Managed Care - HMO | Admitting: Podiatry

## 2014-02-25 DIAGNOSIS — B351 Tinea unguium: Secondary | ICD-10-CM

## 2014-02-25 DIAGNOSIS — M79676 Pain in unspecified toe(s): Secondary | ICD-10-CM

## 2014-02-25 LAB — HEMOGLOBIN A1C: A1c: 8.8

## 2014-02-25 NOTE — Progress Notes (Signed)
Patient ID: Christine Dominguez, female   DOB: [birth year], 63 y.o.   MRN: [id number]  Subjective: This patient presents complaining of painful toenails and plantar keratoses  Objective: The toenails are hypertrophic, elongated, discolored, incurvated 6-10 There is slight plantar keratoses fifth MPJ bilaterally  Assessment: Symptomatic onychomycoses 6-10 Plantar keratoses 2  Plan: Debride nails 10 without a bleeding No debridement needed for plantar keratoses fifth MPJ bilaterally  Reappoint at three-month intervals

## 2014-02-26 ENCOUNTER — Ambulatory Visit (HOSPITAL_COMMUNITY)
Admission: RE | Admit: 2014-02-26 | Discharge: 2014-02-26 | Disposition: A | Discharge status: Home / Self Care | Payer: Commercial Managed Care - HMO | Source: Ambulatory Visit | Attending: Family Medicine | Admitting: Family Medicine

## 2014-02-26 DIAGNOSIS — Z5189 Encounter for other specified aftercare: Secondary | ICD-10-CM | POA: Diagnosis not present

## 2014-02-26 DIAGNOSIS — R29898 Other symptoms and signs involving the musculoskeletal system: Secondary | ICD-10-CM

## 2014-02-26 DIAGNOSIS — M5442 Lumbago with sciatica, left side: Secondary | ICD-10-CM

## 2014-02-26 NOTE — Therapy (Signed)
Physical Therapy Treatment  Patient Details  Name: Christine Dominguez MRN: [id number] Date of Birth: 1950/08/28  Encounter Date: 02/26/2014      PT End of Session - 02/26/14 1600    Visit Number 2   Number of Visits 12   Date for PT Re-Evaluation 03/22/14   Authorization Type [employer]   Authorization - Visit Number 2   Authorization - Number of Visits 10   PT Start Time 1520   PT Stop Time 1600   PT Time Calculation (min) 40 min   Activity Tolerance Patient tolerated treatment well      Past Medical History  Diagnosis Date  . Chronic back pain 2007    Disabled   . Obesity   . Hydrocephalus     Treated with VP shunt and then intracranial tumor resection (?Cholesteatoma); no longer followed actively by neurosurgery or otolaryngology  . Type 2 diabetes mellitus 1991  . Essential hypertension, benign 1980  . GERD (gastroesophageal reflux disease) 2000  . Hypothyroidism 2013  . Hyperlipidemia 2013  . Arthritis     Cervical spine  . Neuropathy   . Anemia   . Cancer 1998, recurred in 2012    Paraganglioma of jugular tympamicum, recurrent in 2012    Past Surgical History  Procedure Laterality Date  . Ventriculoperitoneal shunt  1998    At [facility]  . Intracranial mass resected  1998    At [facility]; ? location-right middle ear  . Hemorrhoidectomy    . Trigger finger release      Rght thumb  . Tubal ligation  1980  . Colonoscopy  06/2010  . Tubal ligation    . Cataract extraction w/phaco Right 07/17/2013    Procedure: CATARACT EXTRACTION PHACO AND INTRAOCULAR LENS PLACEMENT (IOC);  Surgeon: Loraine Leriche T. Nile Riggs, MD;  Location: [facility];  Service: Ophthalmology;  Laterality: Right;  CDE:7.24  . Teeth pulled      There were no vitals taken for this visit.  Visit Diagnosis:  Bilateral low back pain with left-sided sciatica  Left leg weakness      Subjective Assessment - 02/26/14 1540    Symptoms Pt reports compliance with HEP.  States her pain is 5/10, ,mostly through Lt  hip "tender to the touch".  States she's been laying around mostly today on her heating pad.   Currently in Pain? Yes   Pain Score 5    Pain Location Back   Pain Orientation Lower   Pain Descriptors / Indicators Nagging;Tender            [facility] Adult PT Treatment/Exercise - 02/26/14 1541    Lumbar Exercises: Aerobic   Tread Mill 3:20 incline 3% 0.12mph    Lumbar Exercises: Supine   Bridge 10 reps   Straight Leg Raise 10 reps   Other Supine Lumbar Exercises hip adduction isometic x 10   Lumbar Exercises: Prone   Straight Leg Raise 10 reps   Other Prone Lumbar Exercises POE 2 minutes   Other Prone Lumbar Exercises heelsqueezes 10 reps 5" holds   Manual Therapy   Manual Therapy Joint mobilization   Joint Mobilization SI MET for lt anterior hip rotation            PT Short Term Goals - 02/26/14 1600    PT SHORT TERM GOAL #1   Title Pt to be I in HEP   Time 2   Period Weeks   PT SHORT TERM GOAL #2   Title Pt to be able to  sit for two hours in comfort to be able to travel    Time 2   Period Weeks   PT SHORT TERM GOAL #3   Title Pt to be able to stand for 30 minutes without pain to be able to cook    Time 2   Period Weeks   PT SHORT TERM GOAL #4   Title Pt to be walking at least 20 minutes 3 times a week to promote a healthy lifestyly   Time 2          PT Long Term Goals - 02/26/14 1600    PT LONG TERM GOAL #1   Title I in advance HEP   Time 6   Period Weeks   PT LONG TERM GOAL #2   Title Pt to be able to stand for 45 minutes without pain for shopping    Time 6   Period Weeks   PT LONG TERM GOAL #3   Title Pt Lt leg strength to be increased by one grade to allow the above to occur   Time 6   Period Weeks   PT LONG TERM GOAL #4   Title No radicular pain   Time 6   Period Weeks   PT LONG TERM GOAL #5   Title Pain level in back no higher than a  3/10 80 % of the day    Time 6   Period Weeks          Plan - 02/26/14 1603    Clinical Impression  Statement SI with Lt anterior rotation today.  Able to correct with muscle energy technique followed by treadmill.  Pt unable to perform anterior/posterior pelvic tilts despite demonstration and tactile cues.  Able to assess in supine position.  difficulty with gait on treadmill as patient had never been on a treadmill.  Pt required max cues to increase stride and decrease shuffling gait.  Pt became fatigued at 3 minutes and had to stop activity.  Progressed to prone exercises today to help increase lumbar stability.  Pt able to complete with therapist facilitation for correct form.   PT reported being painfree at end of session.   PT Next Visit Plan Add standing heelraises and functional squat next visit.   Continue to Recheck pt SI .  Progress to standing stab exercise to mimic mopping and vaccuming tasks.         Problem List Patient Active Problem List   Diagnosis Date Noted  . Need for prophylactic vaccination and inoculation against influenza 01/19/2014  . Acute back pain with sciatica 01/17/2014  . Rectocele 12/31/2013  . Paraganglioma 08/01/2013  . Routine general medical examination at a health care facility 05/19/2012  . 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 Hypothyroid 11/14/2011  . Hyperlipidemia with target LDL less than 130 11/14/2011  . GERD (gastroesophageal reflux disease) 11/03/2011  . Malignant paraganglioma 07/13/2011  . Hearing loss in right ear 11/24/2010  . Diabetes mellitus, insulin dependent (IDDM), uncontrolled 06/11/2010  . Backache 11/18/2009  . CARPAL TUNNEL SYNDROME 10/19/2009  . Essential hypertension, benign 10/13/2009       Teena Irani, PTA/CLT [phone] 02/26/2014, 4:11 PM

## 2014-03-05 ENCOUNTER — Telehealth (HOSPITAL_COMMUNITY): Payer: Self-pay | Admitting: Physical Therapy

## 2014-03-05 ENCOUNTER — Ambulatory Visit (HOSPITAL_COMMUNITY): Payer: Commercial Managed Care - HMO | Admitting: Physical Therapy

## 2014-03-05 NOTE — Telephone Encounter (Signed)
She is sick today can can not come in

## 2014-03-07 ENCOUNTER — Telehealth (HOSPITAL_COMMUNITY): Payer: Self-pay | Admitting: Physical Therapy

## 2014-03-07 ENCOUNTER — Ambulatory Visit (HOSPITAL_COMMUNITY): Payer: Commercial Managed Care - HMO | Admitting: Physical Therapy

## 2014-03-07 NOTE — Telephone Encounter (Signed)
Her BP is up and she can not stay for PT

## 2014-03-11 ENCOUNTER — Ambulatory Visit (HOSPITAL_COMMUNITY)
Admission: RE | Admit: 2014-03-11 | Discharge: 2014-03-11 | Disposition: A | Discharge status: Home / Self Care | Payer: Commercial Managed Care - HMO | Source: Ambulatory Visit | Attending: Family Medicine | Admitting: Family Medicine

## 2014-03-11 DIAGNOSIS — M6281 Muscle weakness (generalized): Secondary | ICD-10-CM | POA: Diagnosis not present

## 2014-03-11 DIAGNOSIS — M5442 Lumbago with sciatica, left side: Secondary | ICD-10-CM

## 2014-03-11 DIAGNOSIS — R262 Difficulty in walking, not elsewhere classified: Secondary | ICD-10-CM | POA: Diagnosis not present

## 2014-03-11 DIAGNOSIS — M545 Low back pain: Secondary | ICD-10-CM | POA: Insufficient documentation

## 2014-03-11 DIAGNOSIS — Z5189 Encounter for other specified aftercare: Secondary | ICD-10-CM | POA: Insufficient documentation

## 2014-03-11 DIAGNOSIS — R29898 Other symptoms and signs involving the musculoskeletal system: Secondary | ICD-10-CM

## 2014-03-11 NOTE — Therapy (Signed)
[facility] [address] [place], [place], [address] Phone: [phone]   Fax:  [phone]  Physical Therapy Treatment  Patient Details  Name: MARVIS BAKKEN MRN: [id number] Date of Birth: 1950-08-18  Encounter Date: 03/11/2014      PT End of Session - 03/11/14 1345    Visit Number 3   Number of Visits 12   Date for PT Re-Evaluation 03/22/14   Authorization Type [employer]   Authorization - Visit Number 3   Authorization - Number of Visits 10   PT Start Time 9937   PT Stop Time 1345   PT Time Calculation (min) 41 min   Activity Tolerance Patient tolerated treatment well      Past Medical History  Diagnosis Date  . Chronic back pain 2007    Disabled   . Obesity   . Hydrocephalus     Treated with VP shunt and then intracranial tumor resection (?Cholesteatoma); no longer followed actively by neurosurgery or otolaryngology  . Type 2 diabetes mellitus 1991  . Essential hypertension, benign 1980  . GERD (gastroesophageal reflux disease) 2000  . Hypothyroidism 2013  . Hyperlipidemia 2013  . Arthritis     Cervical spine  . Neuropathy   . Anemia   . Cancer 1998, recurred in 2012    Paraganglioma of jugular tympamicum, recurrent in 2012    Past Surgical History  Procedure Laterality Date  . Ventriculoperitoneal shunt  1998    At [facility]  . Intracranial mass resected  1998    At [facility]; ? location-right middle ear  . Hemorrhoidectomy    . Trigger finger release      Rght thumb  . Tubal ligation  1980  . Colonoscopy  06/2010  . Tubal ligation    . Cataract extraction w/phaco Right 07/17/2013    Procedure: CATARACT EXTRACTION PHACO AND INTRAOCULAR LENS PLACEMENT (IOC);  Surgeon: [name], MD;  Location: [facility];  Service: Ophthalmology;  Laterality: Right;  CDE:7.24  . Teeth pulled      There were no vitals taken for this visit.  Visit Diagnosis:  Bilateral low back pain with left-sided sciatica  Left leg weakness       Subjective Assessment - 03/11/14 1315    Symptoms Pt states that she was ill last week.    Currently in Pain? No/denies            [facility] Adult PT Treatment/Exercise - 03/11/14 1316    Exercises   Exercises Lumbar   Lumbar Exercises: Stretches   Active Hamstring Stretch 3 reps;30 seconds   Single Knee to Chest Stretch 3 reps;30 seconds   Lumbar Exercises: Standing   Heel Raises 10 reps   Functional Squats 10 reps   Lumbar Exercises: Supine   Bridge 15 reps   Straight Leg Raise 10 reps   Lumbar Exercises: Sidelying   Clam 10 reps   Lumbar Exercises: Prone   Straight Leg Raise 10 reps   Opposite Arm/Leg Raise 10 reps   Other Prone Lumbar Exercises heel squeeze    Manual Therapy   Manual Therapy Joint mobilization   Joint Mobilization SI MET            PT Short Term Goals - 03/11/14 1349    PT SHORT TERM GOAL #1   Title Pt to be I in HEP   Time 2   Period Weeks   Status Achieved   PT SHORT TERM GOAL #2   Title Pt to be  able to sit for two hours in comfort to be able to travel    Time 2   Period Weeks   Status On-going   PT SHORT TERM GOAL #3   Title Pt to be able to stand for 30 minutes without pain to be able to cook    Time 2   Period Weeks   Status On-going   PT SHORT TERM GOAL #4   Title Pt to be walking at least 20 minutes 3 times a week to promote a healthy lifestyly   Time 2   Period Weeks   Status On-going            Plan - 03/11/14 1346    Clinical Impression Statement Pt SI slightly out of alignment but easily adjusted with mm energy techniques.  Added sidyling and prone exercises with therapist facilitation for improved strength.    Pt will benefit from skilled therapeutic intervention in order to improve on the following deficits Decreased activity tolerance;Decreased range of motion;Decreased strength;Difficulty walking;Pain   Rehab Potential Good   PT Frequency 2x / week   PT Treatment/Interventions Manual techniques;Therapeutic  exercise;Therapeutic activities;Patient/family education   PT Next Visit Plan Continue to Recheck pt SI .  Progress to standing stab exercise to mimic mopping and vaccuming tasks.      Problem List Patient Active Problem List   Diagnosis Date Noted  . Need for prophylactic vaccination and inoculation against influenza 01/19/2014  . Acute back pain with sciatica 01/17/2014  . Rectocele 12/31/2013  . Paraganglioma 08/01/2013  . Routine general medical examination at a health care facility 05/19/2012  . 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 Hypothyroid 11/14/2011  . Hyperlipidemia with target LDL less than 130 11/14/2011  . GERD (gastroesophageal reflux disease) 11/03/2011  . Malignant paraganglioma 07/13/2011  . Hearing loss in right ear 11/24/2010  . Diabetes mellitus, insulin dependent (IDDM), uncontrolled 06/11/2010  . Backache 11/18/2009  . CARPAL TUNNEL SYNDROME 10/19/2009  . Essential hypertension, benign 10/13/2009  Azucena Freed PT/CLT [phone] 03/11/2014, 1:51 PM

## 2014-03-12 ENCOUNTER — Telehealth: Payer: Self-pay | Admitting: *Deleted

## 2014-03-12 NOTE — Telephone Encounter (Signed)
Pt called and LMOM requesting a nurse to give her a call. Please advise [phone]

## 2014-03-12 NOTE — Telephone Encounter (Signed)
states she needs a letter that she has a hard time getting in and out of the tub so she can get her bathroom fixed to make it easier for her. They stated for her to be able to get it she had to have a "hardship? Will collect when done

## 2014-03-12 NOTE — Telephone Encounter (Signed)
Letter sent and patient will collect

## 2014-03-12 NOTE — Telephone Encounter (Addendum)
pls type letter stating that due to herhealth she has difficulty getting in and out of a bathtub, I will sign

## 2014-03-13 ENCOUNTER — Ambulatory Visit (HOSPITAL_COMMUNITY)
Admission: RE | Admit: 2014-03-13 | Discharge: 2014-03-13 | Disposition: A | Discharge status: Home / Self Care | Payer: Commercial Managed Care - HMO | Source: Ambulatory Visit | Attending: Family Medicine | Admitting: Family Medicine

## 2014-03-13 DIAGNOSIS — Z5189 Encounter for other specified aftercare: Secondary | ICD-10-CM | POA: Diagnosis not present

## 2014-03-13 DIAGNOSIS — M5442 Lumbago with sciatica, left side: Secondary | ICD-10-CM

## 2014-03-13 DIAGNOSIS — R29898 Other symptoms and signs involving the musculoskeletal system: Secondary | ICD-10-CM

## 2014-03-13 NOTE — Therapy (Signed)
[facility] [address] [place], [place], [address] Phone: [phone]   Fax:  [phone]  Physical Therapy Treatment  Patient Details  Name: Christine Dominguez MRN: [id number] Date of Birth: January 22, 1951  Encounter Date: 03/13/2014      PT End of Session - 03/13/14 1348    Visit Number 4   Number of Visits 12   Date for PT Re-Evaluation 03/22/14   Authorization Type [employer]   Authorization - Visit Number 4   Authorization - Number of Visits 10   PT Start Time 1300   PT Stop Time 4403   PT Time Calculation (min) 47 min   Activity Tolerance Patient tolerated treatment well      Past Medical History  Diagnosis Date  . Chronic back pain 2007    Disabled   . Obesity   . Hydrocephalus     Treated with VP shunt and then intracranial tumor resection (?Cholesteatoma); no longer followed actively by neurosurgery or otolaryngology  . Type 2 diabetes mellitus 1991  . Essential hypertension, benign 1980  . GERD (gastroesophageal reflux disease) 2000  . Hypothyroidism 2013  . Hyperlipidemia 2013  . Arthritis     Cervical spine  . Neuropathy   . Anemia   . Cancer 1998, recurred in 2012    Paraganglioma of jugular tympamicum, recurrent in 2012    Past Surgical History  Procedure Laterality Date  . Ventriculoperitoneal shunt  1998    At [facility]  . Intracranial mass resected  1998    At [facility]; ? location-right middle ear  . Hemorrhoidectomy    . Trigger finger release      Rght thumb  . Tubal ligation  1980  . Colonoscopy  06/2010  . Tubal ligation    . Cataract extraction w/phaco Right 07/17/2013    Procedure: CATARACT EXTRACTION PHACO AND INTRAOCULAR LENS PLACEMENT (IOC);  Surgeon: [name], MD;  Location: [facility];  Service: Ophthalmology;  Laterality: Right;  CDE:7.24  . Teeth pulled      There were no vitals taken for this visit.  Visit Diagnosis:  No diagnosis found.          [facility] Adult PT Treatment/Exercise - 03/13/14  1337    Lumbar Exercises: Aerobic   Tread Mill 3% incline, 5 minutes at 0.66mph following MET   Lumbar Exercises: Standing   Heel Raises 10 reps   Functional Squats 10 reps   Forward Lunge 10 reps;Limitations   Forward Lunge Limitations bilaterally   Lumbar Exercises: Supine   Bridge 15 reps   Straight Leg Raise 15 reps   Other Supine Lumbar Exercises hip adduction isometic x 15   Lumbar Exercises: Sidelying   Clam 10 reps   Lumbar Exercises: Prone   Straight Leg Raise 10 reps   Opposite Arm/Leg Raise 10 reps   Other Prone Lumbar Exercises heel squeeze    Other Prone Lumbar Exercises heelsqueezes 10 reps 5" holds   Manual Therapy   Manual Therapy Joint mobilization   Joint Mobilization MET for Lt SI anterior rotation            PT Short Term Goals - 03/13/14 1348    PT SHORT TERM GOAL #1   Title Pt to be I in HEP   Time 2   Period Weeks   Status Achieved   PT SHORT TERM GOAL #2   Title Pt to be able to sit for two hours in comfort to be able to travel  Time 2   Period Weeks   Status On-going   PT SHORT TERM GOAL #3   Title Pt to be able to stand for 30 minutes without pain to be able to cook    Time 2   Period Weeks   Status On-going   PT SHORT TERM GOAL #4   Title Pt to be walking at least 20 minutes 3 times a week to promote a healthy lifestyly   Time 2   Period Weeks   Status On-going          PT Long Term Goals - 03/13/14 1348    PT LONG TERM GOAL #1   Title I in advance HEP   Time 6   Period Weeks   PT LONG TERM GOAL #2   Title Pt to be able to stand for 45 minutes without pain for shopping    Time 6   Period Weeks   PT LONG TERM GOAL #3   Title Pt Lt leg strength to be increased by one grade to allow the above to occur   Time 6   Period Weeks   PT LONG TERM GOAL #4   Title No radicular pain   Time 6   Period Weeks   PT LONG TERM GOAL #5   Title Pain level in back no higher than a  3/10 80 % of the day    Time 6   Period Weeks           Plan - 03/13/14 1349    Clinical Impression Statement Lt SI anteriorly rotated today; able to readjust with muscle energy technique in supine.  Pt reported no pain following manual .  Progressed to forward lunges (as to mimic vacuuming/mopping).  Pt able to complete with tactile cues.  Pt    Pt will benefit from skilled therapeutic intervention in order to improve on the following deficits --   Rehab Potential --   PT Frequency --   PT Treatment/Interventions --   PT Next Visit Plan Continue to Recheck pt SI .  Progress to standing functional actvities/stab exercise.          Problem List Patient Active Problem List   Diagnosis Date Noted  . Need for prophylactic vaccination and inoculation against influenza 01/19/2014  . Acute back pain with sciatica 01/17/2014  . Rectocele 12/31/2013  . Paraganglioma 08/01/2013  . Routine general medical examination at a health care facility 05/19/2012  . 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 Hypothyroid 11/14/2011  . Hyperlipidemia with target LDL less than 130 11/14/2011  . GERD (gastroesophageal reflux disease) 11/03/2011  . Malignant paraganglioma 07/13/2011  . Hearing loss in right ear 11/24/2010  . Diabetes mellitus, insulin dependent (IDDM), uncontrolled 06/11/2010  . Backache 11/18/2009  . CARPAL TUNNEL SYNDROME 10/19/2009  . Essential hypertension, benign 10/13/2009    Teena Irani, PTA/CLT [phone] 03/13/2014, 1:57 PM

## 2014-03-19 ENCOUNTER — Ambulatory Visit (HOSPITAL_COMMUNITY): Payer: Commercial Managed Care - HMO | Admitting: Physical Therapy

## 2014-03-20 ENCOUNTER — Ambulatory Visit (HOSPITAL_COMMUNITY)
Admission: RE | Admit: 2014-03-20 | Discharge: 2014-03-20 | Disposition: A | Discharge status: Home / Self Care | Payer: Commercial Managed Care - HMO | Source: Ambulatory Visit | Attending: Family Medicine | Admitting: Family Medicine

## 2014-03-20 DIAGNOSIS — M5442 Lumbago with sciatica, left side: Secondary | ICD-10-CM

## 2014-03-20 DIAGNOSIS — R29898 Other symptoms and signs involving the musculoskeletal system: Secondary | ICD-10-CM

## 2014-03-20 NOTE — Therapy (Signed)
[facility] [address] [place], [place], [address] Phone: [phone]   Fax:  [phone]  Patient Details  Name: Christine Dominguez MRN: [id number] Date of Birth: 11-26-1950  Encounter Date: 03/20/2014  Pt came to  Therapy reporting she has had diarrhea all morning and her brother currently has the stomach virus.  Cancelled appointment for today.  Pt informed to cancel tomorrows session if she is still having episodes and must be symptom free for 12 hours.  Teena Irani, PTA/CLT [phone] 03/20/2014, 10:08 AM

## 2014-03-21 ENCOUNTER — Ambulatory Visit (HOSPITAL_COMMUNITY): Payer: Commercial Managed Care - HMO | Admitting: Physical Therapy

## 2014-03-22 ENCOUNTER — Encounter: Payer: Medicare HMO | Admitting: Nutrition

## 2014-03-25 ENCOUNTER — Ambulatory Visit (HOSPITAL_COMMUNITY): Payer: Commercial Managed Care - HMO | Admitting: Physical Therapy

## 2014-03-27 ENCOUNTER — Ambulatory Visit (HOSPITAL_COMMUNITY): Payer: Commercial Managed Care - HMO | Admitting: Physical Therapy

## 2014-04-01 ENCOUNTER — Encounter (HOSPITAL_COMMUNITY): Payer: Medicare HMO | Admitting: Physical Therapy

## 2014-04-03 ENCOUNTER — Encounter (HOSPITAL_COMMUNITY): Payer: Medicare HMO | Admitting: Physical Therapy

## 2014-04-08 ENCOUNTER — Ambulatory Visit (HOSPITAL_COMMUNITY): Payer: Commercial Managed Care - HMO | Admitting: Physical Therapy

## 2014-04-10 ENCOUNTER — Ambulatory Visit (HOSPITAL_COMMUNITY): Payer: Commercial Managed Care - HMO | Attending: Family Medicine

## 2014-04-10 DIAGNOSIS — Z5189 Encounter for other specified aftercare: Secondary | ICD-10-CM | POA: Insufficient documentation

## 2014-04-10 DIAGNOSIS — M6281 Muscle weakness (generalized): Secondary | ICD-10-CM | POA: Insufficient documentation

## 2014-04-10 DIAGNOSIS — R262 Difficulty in walking, not elsewhere classified: Secondary | ICD-10-CM | POA: Insufficient documentation

## 2014-04-10 DIAGNOSIS — M545 Low back pain: Secondary | ICD-10-CM | POA: Insufficient documentation

## 2014-04-15 ENCOUNTER — Ambulatory Visit (HOSPITAL_COMMUNITY): Payer: Commercial Managed Care - HMO | Admitting: Physical Therapy

## 2014-04-17 ENCOUNTER — Ambulatory Visit (HOSPITAL_COMMUNITY): Payer: Commercial Managed Care - HMO | Admitting: Physical Therapy

## 2014-04-19 NOTE — Addendum Note (Signed)
Encounter addended by: Leeroy Cha, PT on: 04/19/2014 11:44 AM<BR>     Documentation filed: Clinical Notes, Episodes

## 2014-04-19 NOTE — Therapy (Signed)
[facility] [address] [place], [place], [address] Phone: [phone]   Fax:  [phone]  Patient Details  Name: Christine Dominguez MRN: [id number] Date of Birth: 03/04/1951 Referring Provider:  Simpson, Margaret E, MD  Encounter Date: 03/20/2014 PHYSICAL THERAPY DISCHARGE SUMMARY  Visits from Start of Care: 4 Current functional level related to goals / functional outcomes: unknown as pt did not return for follow up visit   Remaining deficits: unknown   Education / Equipment: Given  HEP  Plan: Patient agrees to discharge.  Patient goals were partially met. Patient is being discharged due to not returning since the last visit.  ?????       RUSSELL,CINDY 04/19/2014, 11:41 AM  [facility] [address] [place], [place], [address] Phone: [phone]   Fax:  [phone] 

## 2014-05-09 ENCOUNTER — Encounter: Payer: Self-pay | Admitting: Nutrition

## 2014-05-09 ENCOUNTER — Encounter: Payer: Commercial Managed Care - HMO | Attending: "Endocrinology | Admitting: Nutrition

## 2014-05-09 VITALS — Ht 61.0 in | Wt 167.0 lb

## 2014-05-09 DIAGNOSIS — Z713 Dietary counseling and surveillance: Secondary | ICD-10-CM | POA: Insufficient documentation

## 2014-05-09 DIAGNOSIS — E118 Type 2 diabetes mellitus with unspecified complications: Secondary | ICD-10-CM | POA: Diagnosis not present

## 2014-05-09 DIAGNOSIS — Z6831 Body mass index (BMI) 31.0-31.9, adult: Secondary | ICD-10-CM | POA: Diagnosis not present

## 2014-05-09 DIAGNOSIS — E1165 Type 2 diabetes mellitus with hyperglycemia: Secondary | ICD-10-CM

## 2014-05-09 DIAGNOSIS — Z794 Long term (current) use of insulin: Secondary | ICD-10-CM | POA: Diagnosis not present

## 2014-05-09 DIAGNOSIS — E669 Obesity, unspecified: Secondary | ICD-10-CM | POA: Diagnosis not present

## 2014-05-09 DIAGNOSIS — IMO0002 Reserved for concepts with insufficient information to code with codable children: Secondary | ICD-10-CM

## 2014-05-09 NOTE — Patient Instructions (Signed)
Goals:  Follow Diabetes Meal Plan as instructed  Eat 3 meals   Be sure to take Lantus every  Night 30 units  Drink 4 bottles of water  Limit carbohydrate intake to 30-45 grams carbohydrate/meal  Increase low carb vegetetables to 2 servings per meal  Add lean protein foods to meals/snacks  Monitor glucose levels in the am and pm daily.  Aim for 30 mins of physical activity daily  Bring food record and glucose log to your next nutrition visit       Goal: Get A1C down 7.5% in three months.        Lose 2-3 in a month.

## 2014-05-09 NOTE — Progress Notes (Signed)
  Medical Nutrition Therapy:  Appt start time: 1330 end time:  1430.  Assessment:  Primary concerns today: Diabets. Most recent A1C was 8.4%.  Lives by herself. Does her own shopping and cooking. Recently had some teeth pulled and not able to eat certain foods and so she limited. FBS 130-180 mg/dl. He lost her husband 2 years. She is afraid of her blood sugars dropping too low and being at home bby herself. She eats to prevent low blood sugars. Diet is excessive in CHO, calories and causing weight gain and uncontrolled blood sugars.  She is willing to change eating habits to improve DM and lose weight.  Preferred Learning Style:     No preference indicated   Learning Readiness:       Reade  Change in progress  MEDICATIONS: See list   DIETARY INTAKE:  24-hr recall:  B ( AM): Bacon/eggs or Oatmeal. Coffee, evaporated milk Snk ( AM): cheetos L ( PM): PB crackers 4, water 2 hot dogs with buns with chili and mustard, Diet coke Snk ( PM): none D ( PM): 2 hot dogs without the buns, Diet coke and water Snk ( PM): none Beverages: water, diet soda  Usual physical activity:  Walk and housework  Estimated energy needs: 1500 calories 170 g carbohydrates 112 g protein 42 g fat  Progress Towards Goal(s):  In progress.   Nutritional Diagnosis:  NB-1.1 Food and nutrition-related knowledge deficit As related to Diabetes.  As evidenced by A1C >8%.    Intervention:  Nutrition Counseling and diabetes education on diet, meal planning, CHO counting, portion control, target ranges for blood sugars, treatment of hyper/hypoglycemia and prevention of complications and benefits of weight loss.  Goals:  Follow Diabetes Meal Plan as instructed  Eat 3 meals   Be sure to take Lantus every  Night 30 units  Drink 4 bottles of water  Limit carbohydrate intake to 30-45 grams carbohydrate/meal  Increase low carb vegetetables to 2 servings per meal  Add lean protein foods to  meals/snacks  Monitor glucose levels in the am and pm daily.  Aim for 30 mins of physical activity daily  Bring food record and glucose log to your next nutrition visit       Goal: Get A1C down 7.5% in three months.        Lose 2-3 in a month. Teaching Method Utilized:  Visual Auditory Hands on  Handouts given during visit include: The Plate Method Carb Counting and Food Label handouts Meal Plan Card   Barriers to learning/adherence to lifestyle change: none  Demonstrated degree of understanding via:  Teach Back   Monitoring/Evaluation:  Dietary intake, exercise, meal planning, SBG, and body weight in 1 month(s).

## 2014-05-15 ENCOUNTER — Other Ambulatory Visit: Payer: Self-pay | Admitting: Cardiology

## 2014-05-15 ENCOUNTER — Other Ambulatory Visit: Payer: Self-pay | Admitting: Family Medicine

## 2014-05-27 ENCOUNTER — Ambulatory Visit: Payer: Commercial Managed Care - HMO | Admitting: Podiatry

## 2014-06-07 DIAGNOSIS — E1122 Type 2 diabetes mellitus with diabetic chronic kidney disease: Secondary | ICD-10-CM | POA: Diagnosis not present

## 2014-06-07 DIAGNOSIS — Z713 Dietary counseling and surveillance: Secondary | ICD-10-CM | POA: Diagnosis not present

## 2014-06-07 DIAGNOSIS — E782 Mixed hyperlipidemia: Secondary | ICD-10-CM | POA: Diagnosis not present

## 2014-06-07 DIAGNOSIS — I10 Essential (primary) hypertension: Secondary | ICD-10-CM | POA: Diagnosis not present

## 2014-06-14 DIAGNOSIS — I10 Essential (primary) hypertension: Secondary | ICD-10-CM | POA: Diagnosis not present

## 2014-06-14 DIAGNOSIS — E782 Mixed hyperlipidemia: Secondary | ICD-10-CM | POA: Diagnosis not present

## 2014-06-14 DIAGNOSIS — E1165 Type 2 diabetes mellitus with hyperglycemia: Secondary | ICD-10-CM | POA: Diagnosis not present

## 2014-06-14 DIAGNOSIS — E559 Vitamin D deficiency, unspecified: Secondary | ICD-10-CM | POA: Diagnosis not present

## 2014-06-14 DIAGNOSIS — E038 Other specified hypothyroidism: Secondary | ICD-10-CM | POA: Diagnosis not present

## 2014-06-20 ENCOUNTER — Encounter: Payer: Self-pay | Admitting: Nutrition

## 2014-06-20 ENCOUNTER — Encounter: Payer: Commercial Managed Care - HMO | Attending: "Endocrinology | Admitting: Nutrition

## 2014-06-20 VITALS — Ht 61.0 in | Wt 167.0 lb

## 2014-06-20 DIAGNOSIS — IMO0002 Reserved for concepts with insufficient information to code with codable children: Secondary | ICD-10-CM

## 2014-06-20 DIAGNOSIS — E1165 Type 2 diabetes mellitus with hyperglycemia: Secondary | ICD-10-CM

## 2014-06-20 DIAGNOSIS — Z713 Dietary counseling and surveillance: Secondary | ICD-10-CM | POA: Diagnosis not present

## 2014-06-20 DIAGNOSIS — Z794 Long term (current) use of insulin: Secondary | ICD-10-CM | POA: Diagnosis not present

## 2014-06-20 DIAGNOSIS — E669 Obesity, unspecified: Secondary | ICD-10-CM | POA: Insufficient documentation

## 2014-06-20 DIAGNOSIS — E118 Type 2 diabetes mellitus with unspecified complications: Secondary | ICD-10-CM | POA: Diagnosis not present

## 2014-06-20 DIAGNOSIS — Z6831 Body mass index (BMI) 31.0-31.9, adult: Secondary | ICD-10-CM | POA: Diagnosis not present

## 2014-06-20 NOTE — Patient Instructions (Signed)
Goals: Cut out diet sodas Increase water intake Increase low carb vegetables to 2 servings per meal Lose 1 lb per week. Increase fiber in the diet. Get A1C 7%.

## 2014-06-20 NOTE — Progress Notes (Signed)
  Medical Nutrition Therapy:  Appt start time: 1030 end time:  1100.  Assessment:  Primary concerns today: Diabetes follow up. Walking more now. Has changed her eating habits to eating baked and broiled foods and more healthier vegetables..Eating better balanced meals now.   Most recent A1C 8. 9%. It has not changed since last visit. She takes  30 units of Lantus daily and Trajenta 5 mg. FBS 130-150's. Night time BS are elevated 180-200's.     Diet is low in low carb vegetables. Weight has not changed. Has cut out snacks for the most part. Still drinking diet sodas. Not as active as she needs to be for needed weight loss.  Preferred Learning Style:     No preference indicated   Learning Readiness:       Reade  Change in progress  MEDICATIONS: See list   DIETARY INTAKE:  24-hr recall:  B ( AM):Oatmeals and blueberries, coffee and Evaporated milk, water with boiled egg. Snk ( AM): L ( PM): Chicken baked, onions, tomatoes, rice 1/2 c and diet soda and apple sauce Snk ( PM): none D ( PM): Hamburger, potato wedges, 1 slice cheese and slaw, Diet Coke, water,  Snk ( PM): none Beverages: water, diet soda  Usual physical activity:  Walk and housework  Estimated energy needs: 1500 calories 170 g carbohydrates 112 g protein 42 g fat  Progress Towards Goal(s):  In progress.   Nutritional Diagnosis:  NB-1.1 Food and nutrition-related knowledge deficit As related to Diabetes.  As evidenced by A1C >8%.    Intervention:  Nutrition Counseling and diabetes education on diet, meal planning, CHO counting, portion control, target ranges for blood sugars, treatment of hyper/hypoglycemia and prevention of complications and benefits of weight loss.  Goals: Cut out diet sodas Increase water intake Increase low carb vegetables to 2 servings per meal Decrease portions of corn, peas, rice and potatoes. Lose 1 lb per week. Get A1C 7%.  Teaching Method Utilized:  Visual Auditory Hands  on  Handouts given during visit include: The Plate Method Carb Counting and Food Label handouts Meal Plan Card   Barriers to learning/adherence to lifestyle change: none  Demonstrated degree of understanding via:  Teach Back   Monitoring/Evaluation:  Dietary intake, exercise, meal planning, SBG, and body weight in 1 month(s).

## 2014-06-21 LAB — LDL CHOLESTEROL, DIRECT
A1c: 8
LDL Cholesterol: 53 mg/dL

## 2014-06-26 ENCOUNTER — Ambulatory Visit (INDEPENDENT_AMBULATORY_CARE_PROVIDER_SITE_OTHER): Payer: Commercial Managed Care - HMO | Admitting: Podiatry

## 2014-06-26 ENCOUNTER — Encounter: Payer: Self-pay | Admitting: Podiatry

## 2014-06-26 DIAGNOSIS — M79676 Pain in unspecified toe(s): Secondary | ICD-10-CM | POA: Diagnosis not present

## 2014-06-26 DIAGNOSIS — B351 Tinea unguium: Secondary | ICD-10-CM | POA: Diagnosis not present

## 2014-06-26 NOTE — Progress Notes (Signed)
Patient ID: Christine Dominguez, female   DOB: 1950-09-10, 64 y.o.   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

## 2014-06-26 NOTE — Patient Instructions (Signed)
Diabetes and Foot Care Diabetes may cause you to have problems because of poor blood supply (circulation) to your feet and legs. This may cause the skin on your feet to become thinner, break easier, and heal more slowly. Your skin may become dry, and the skin may peel and crack. You may also have nerve damage in your legs and feet causing decreased feeling in them. You may not notice minor injuries to your feet that could lead to infections or more serious problems. Taking care of your feet is one of the most important things you can do for yourself.  HOME CARE INSTRUCTIONS  Wear shoes at all times, even in the house. Do not go barefoot. Bare feet are easily injured.  Check your feet daily for blisters, cuts, and redness. If you cannot see the bottom of your feet, use a mirror or ask someone for help.  Wash your feet with warm water (do not use hot water) and mild soap. Then pat your feet and the areas between your toes until they are completely dry. Do not soak your feet as this can dry your skin.  Apply a moisturizing lotion or petroleum jelly (that does not contain alcohol and is unscented) to the skin on your feet and to dry, brittle toenails. Do not apply lotion between your toes.  Trim your toenails straight across. Do not dig under them or around the cuticle. File the edges of your nails with an emery board or nail file.  Do not cut corns or calluses or try to remove them with medicine.  Wear clean socks or stockings every day. Make sure they are not too tight. Do not wear knee-high stockings since they may decrease blood flow to your legs.  Wear shoes that fit properly and have enough cushioning. To break in new shoes, wear them for just a few hours a day. This prevents you from injuring your feet. Always look in your shoes before you put them on to be sure there are no objects inside.  Do not cross your legs. This may decrease the blood flow to your feet.  If you find a minor scrape,  cut, or break in the skin on your feet, keep it and the skin around it clean and dry. These areas may be cleansed with mild soap and water. Do not cleanse the area with peroxide, alcohol, or iodine.  When you remove an adhesive bandage, be sure not to damage the skin around it.  If you have a wound, look at it several times a day to make sure it is healing.  Do not use heating pads or hot water bottles. They may burn your skin. If you have lost feeling in your feet or legs, you may not know it is happening until it is too late.  Make sure your health care provider performs a complete foot exam at least annually or more often if you have foot problems. Report any cuts, sores, or bruises to your health care provider immediately. SEEK MEDICAL CARE IF:   You have an injury that is not healing.  You have cuts or breaks in the skin.  You have an ingrown nail.  You notice redness on your legs or feet.  You feel burning or tingling in your legs or feet.  You have pain or cramps in your legs and feet.  Your legs or feet are numb.  Your feet always feel cold. SEEK IMMEDIATE MEDICAL CARE IF:   There is increasing redness,   swelling, or pain in or around a wound.  There is a red line that goes up your leg.  Pus is coming from a wound.  You develop a fever or as directed by your health care provider.  You notice a bad smell coming from an ulcer or wound. Document Released: 03/19/2000 Document Revised: 11/22/2012 Document Reviewed: 08/29/2012 ExitCare Patient Information 2015 [employer]. This information is not intended to replace advice given to you by your health care provider. Make sure you discuss any questions you have with your health care provider.  

## 2014-07-02 ENCOUNTER — Other Ambulatory Visit: Payer: Self-pay | Admitting: Family Medicine

## 2014-07-30 ENCOUNTER — Encounter: Payer: Commercial Managed Care - HMO | Admitting: Adult Health

## 2014-07-30 ENCOUNTER — Ambulatory Visit: Payer: Medicare HMO | Admitting: Cardiology

## 2014-07-30 ENCOUNTER — Encounter: Payer: Self-pay | Admitting: Adult Health

## 2014-07-30 NOTE — Progress Notes (Signed)
Cardiology Office Note   Date:  07/30/2014   ID:  SHAKYIA BOSSO, DOB September 29, 1950, MRN [id number] [facility] Phone: [phone]; Fax: [phone]

## 2014-07-31 ENCOUNTER — Telehealth: Payer: Self-pay | Admitting: Family Medicine

## 2014-08-01 NOTE — Telephone Encounter (Signed)
I recommend OTC zantac, and if having pain and poor appetite needs to have GI check her pleas e enter referral for this if she agrees I will sign

## 2014-08-01 NOTE — Telephone Encounter (Signed)
Has been hurting in her upper mid abdomen x 1 week. (states it happens from time to time) her bowels have been regular and she just hurts like she is full of gas. When she belches, she feels better. Wants to know if there is anything she can take for it. Hasn't really had much of an appetite either. Pepto makes her nauseated. Please advise

## 2014-08-01 NOTE — Telephone Encounter (Signed)
Advise ED eval if pain severe and not eating

## 2014-08-01 NOTE — Telephone Encounter (Signed)
Patient aware.

## 2014-08-04 ENCOUNTER — Emergency Department (HOSPITAL_COMMUNITY)
Admission: EM | Admit: 2014-08-04 | Discharge: 2014-08-04 | Disposition: A | Discharge status: Home / Self Care | Payer: Commercial Managed Care - HMO | Attending: Emergency Medicine | Admitting: Emergency Medicine

## 2014-08-04 ENCOUNTER — Encounter (HOSPITAL_COMMUNITY): Payer: Self-pay | Admitting: *Deleted

## 2014-08-04 ENCOUNTER — Other Ambulatory Visit: Payer: Self-pay

## 2014-08-04 DIAGNOSIS — E669 Obesity, unspecified: Secondary | ICD-10-CM | POA: Diagnosis not present

## 2014-08-04 DIAGNOSIS — Z862 Personal history of diseases of the blood and blood-forming organs and certain disorders involving the immune mechanism: Secondary | ICD-10-CM | POA: Diagnosis not present

## 2014-08-04 DIAGNOSIS — M1990 Unspecified osteoarthritis, unspecified site: Secondary | ICD-10-CM | POA: Diagnosis not present

## 2014-08-04 DIAGNOSIS — K219 Gastro-esophageal reflux disease without esophagitis: Secondary | ICD-10-CM | POA: Insufficient documentation

## 2014-08-04 DIAGNOSIS — Z7982 Long term (current) use of aspirin: Secondary | ICD-10-CM | POA: Diagnosis not present

## 2014-08-04 DIAGNOSIS — Z87891 Personal history of nicotine dependence: Secondary | ICD-10-CM | POA: Diagnosis not present

## 2014-08-04 DIAGNOSIS — I10 Essential (primary) hypertension: Secondary | ICD-10-CM | POA: Diagnosis not present

## 2014-08-04 DIAGNOSIS — G629 Polyneuropathy, unspecified: Secondary | ICD-10-CM | POA: Diagnosis not present

## 2014-08-04 DIAGNOSIS — Z8589 Personal history of malignant neoplasm of other organs and systems: Secondary | ICD-10-CM | POA: Insufficient documentation

## 2014-08-04 DIAGNOSIS — G8929 Other chronic pain: Secondary | ICD-10-CM | POA: Diagnosis not present

## 2014-08-04 DIAGNOSIS — E119 Type 2 diabetes mellitus without complications: Secondary | ICD-10-CM | POA: Insufficient documentation

## 2014-08-04 DIAGNOSIS — E039 Hypothyroidism, unspecified: Secondary | ICD-10-CM | POA: Insufficient documentation

## 2014-08-04 DIAGNOSIS — Z794 Long term (current) use of insulin: Secondary | ICD-10-CM | POA: Diagnosis not present

## 2014-08-04 DIAGNOSIS — E785 Hyperlipidemia, unspecified: Secondary | ICD-10-CM | POA: Insufficient documentation

## 2014-08-04 DIAGNOSIS — Z79899 Other long term (current) drug therapy: Secondary | ICD-10-CM | POA: Insufficient documentation

## 2014-08-04 LAB — CBC WITH DIFFERENTIAL/PLATELET
BASOS PCT: 0 % (ref 0–1)
Basophils Absolute: 0 10*3/uL (ref 0.0–0.1)
EOS PCT: 2 % (ref 0–5)
Eosinophils Absolute: 0.2 10*3/uL (ref 0.0–0.7)
HCT: 33.8 % — ABNORMAL LOW (ref 36.0–46.0)
HEMOGLOBIN: 10.8 g/dL — AB (ref 12.0–15.0)
LYMPHS ABS: 1.6 10*3/uL (ref 0.7–4.0)
Lymphocytes Relative: 20 % (ref 12–46)
MCH: 27.1 pg (ref 26.0–34.0)
MCHC: 32 g/dL (ref 30.0–36.0)
MCV: 84.9 fL (ref 78.0–100.0)
MONOS PCT: 5 % (ref 3–12)
Monocytes Absolute: 0.4 10*3/uL (ref 0.1–1.0)
Neutro Abs: 5.7 10*3/uL (ref 1.7–7.7)
Neutrophils Relative %: 73 % (ref 43–77)
PLATELETS: 228 10*3/uL (ref 150–400)
RBC: 3.98 MIL/uL (ref 3.87–5.11)
RDW: 13.2 % (ref 11.5–15.5)
WBC: 7.9 10*3/uL (ref 4.0–10.5)

## 2014-08-04 LAB — BASIC METABOLIC PANEL
Anion gap: 6 (ref 5–15)
BUN: 16 mg/dL (ref 6–20)
CHLORIDE: 103 mmol/L (ref 101–111)
CO2: 27 mmol/L (ref 22–32)
Calcium: 8.6 mg/dL — ABNORMAL LOW (ref 8.9–10.3)
Creatinine, Ser: 1.27 mg/dL — ABNORMAL HIGH (ref 0.44–1.00)
GFR calc non Af Amer: 44 mL/min — ABNORMAL LOW (ref >60)
GFR, EST AFRICAN AMERICAN: 51 mL/min — AB (ref >60)
Glucose, Bld: 203 mg/dL — ABNORMAL HIGH (ref 70–99)
POTASSIUM: 3.5 mmol/L (ref 3.5–5.1)
SODIUM: 136 mmol/L (ref 135–145)

## 2014-08-04 LAB — TROPONIN I: Troponin I: <0.03 ng/mL (ref <0.031)

## 2014-08-04 NOTE — ED Notes (Signed)
Pt reports not feeling good tonight & checked tonight was high. Pt took 2 clonidine before coming to the ER.

## 2014-08-04 NOTE — ED Provider Notes (Signed)
CSN: [id number]     Arrival date & time 08/04/14  0010 History  This chart was scribed for Christine Fuel, MD by Stephania Fragmin, ED Scribe. This patient was seen in room APA15/APA15 and the patient's care was started at 12:36 AM.    Chief Complaint  Patient presents with  . Hypertension   The history is provided by the patient. No language interpreter was used.     HPI Comments: Christine Dominguez is a 64 y.o. female with a history of hypertension, type 2 DM, hyperlipidemia, obesity, and cancer who presents to the Emergency Department complaining of hypertension . Patient was getting ready to go to bed when she felt a pain on her right shoulder. She then checked her blood pressure, which was about 200/100. She took a clonidine and checked a second time; her blood pressure afterwards was 329 systolic, although patient states she thought that might be because she scared herself. She then took another half a clonidine. She states her blood pressure has never been this high. Patient usually checks her blood pressure when she gets up, and occasionally once in the day. Patient's blood pressure usually runs about 130/60. Patient denies any headache or changes in tinnitus.   Past Medical History  Diagnosis Date  . Chronic back pain 2007    Disabled   . Obesity   . Hydrocephalus     Treated with VP shunt and then intracranial tumor resection (?Cholesteatoma); no longer followed actively by neurosurgery or otolaryngology  . Type 2 diabetes mellitus 1991  . Essential hypertension, benign 1980  . GERD (gastroesophageal reflux disease) 2000  . Hypothyroidism 2013  . Hyperlipidemia 2013  . Arthritis     Cervical spine  . Neuropathy   . Anemia   . Cancer 1998, recurred in 2012    Paraganglioma of jugular tympamicum, recurrent in 2012   Past Surgical History  Procedure Laterality Date  . Ventriculoperitoneal shunt  1998    At [facility]  . Intracranial mass resected  1998    At [facility]; ? location-right  middle ear  . Hemorrhoidectomy    . Trigger finger release      Rght thumb  . Tubal ligation  1980  . Colonoscopy  06/2010  . Tubal ligation    . Cataract extraction w/phaco Right 07/17/2013    Procedure: CATARACT EXTRACTION PHACO AND INTRAOCULAR LENS PLACEMENT (IOC);  Surgeon: [name], MD;  Location: [facility];  Service: Ophthalmology;  Laterality: Right;  CDE:7.24  . Teeth pulled     Family History  Problem Relation Age of Onset  . Lung cancer Mother   . Hypertension Mother 79  . Kidney failure Brother   . Diabetes Sister   . Diabetes Brother   . Hypertension Brother   . Hypertension Maternal Grandmother   . Other Son     stillborn  . Other Son     died at birth   History  Substance Use Topics  . Smoking status: Former Smoker -- 0.50 packs/day for 15 years    Types: Cigarettes    Quit date: 07/07/2001  . Smokeless tobacco: Never Used  . Alcohol Use: No   OB History    Gravida Para Term Preterm AB TAB SAB Ectopic Multiple Living   3 2   1   1  1      Review of Systems  HENT: Negative for tinnitus.   Neurological: Negative for headaches.  All other systems reviewed and are negative.   Allergies  Amaryl; Aspirin; Avandia; Glimepiride; Glipizide; Invokana; and Novolog  Home Medications   Prior to Admission medications   Medication Sig Start Date End Date Taking? Authorizing Provider  aspirin EC 81 MG tablet Take 81 mg by mouth at bedtime.   Yes Historical Provider, MD  Camphor-Eucalyptus-Menthol (MEDICATED CHEST RUB) 4.73-1.2-2.6 % OINT  12/04/13  Yes Historical Provider, MD  cloNIDine (CATAPRES) 0.1 MG tablet Take 1 tablet (0.1 mg total) by mouth daily. 08/28/13  Yes [name], MD  docusate sodium (COLACE) 100 MG capsule Take 100 mg by mouth every other day.    Yes Historical Provider, MD  gabapentin (NEURONTIN) 100 MG capsule TAKE 1 CAPSULE TWICE DAILY 07/03/14  Yes [name], MD  glucose [facility] GLUCOSE) 4 GM chewable tablet Chew by mouth.  07/29/11  Yes Historical Provider, MD  hydrochlorothiazide (HYDRODIURIL) 12.5 MG tablet TAKE 1 TABLET EVERY DAY 05/16/14  Yes Satira Sark, MD  hydrochlorothiazide (HYDRODIURIL) 25 MG tablet Take 0.5 tablets (12.5 mg total) by mouth daily. 08/30/13  Yes Satira Sark, MD  insulin glargine (LANTUS SOLOSTAR) 100 UNIT/ML injection Inject 25 Units into the skin at bedtime.    Yes Historical Provider, MD  Iron 66 MG TABS Take 1 tablet by mouth 2 (two) times daily.    Yes Historical Provider, MD  levothyroxine (SYNTHROID, LEVOTHROID) 50 MCG tablet Take 50 mcg by mouth daily before breakfast.   Yes Historical Provider, MD  linagliptin (TRADJENTA) 5 MG TABS tablet Take 5 mg by mouth daily.   Yes Historical Provider, MD  meloxicam (MOBIC) 15 MG tablet One tablet once daily for 5 days only 01/17/14  Yes [name], MD  nystatin (MYCOSTATIN) powder APPLY TOPICALLY AT BEDTIME TO RASH ON LOWER ABDOMEN AS NEEDED  05/17/14  Yes [name], MD  omeprazole (PRILOSEC) 40 MG capsule TAKE 1 CAPSULE ONE TIME DAILY 07/03/14  Yes [name], MD  ranitidine (ZANTAC) 150 MG tablet Take 150 mg by mouth daily.   Yes Historical Provider, MD  Simethicone (GAS RELIEF DROPS PO) Take by mouth as needed.   Yes Historical Provider, MD  simvastatin (ZOCOR) 20 MG tablet Take 20 mg by mouth at bedtime.     Yes Historical Provider, MD  sodium chloride (OCEAN) 0.65 % nasal spray  12/04/13  Yes Historical Provider, MD  tiZANidine (ZANAFLEX) 2 MG tablet Take 1 tablet (2 mg total) by mouth daily as needed for muscle spasms. 08/01/13  Yes [name], MD  valsartan (DIOVAN) 160 MG tablet TAKE 1 TABLET EVERY DAY 08/30/13  Yes Satira Sark, MD   BP 219/96 mmHg  Pulse 91  Temp(Src) 99 F (37.2 C) (Oral)  Resp 18  Ht 5' 1.5" (1.562 m)  Wt 167 lb (75.751 kg)  BMI 31.05 kg/m2  SpO2 100% Physical Exam  Constitutional: She is oriented to person, place, and time. She appears well-developed and  well-nourished. No distress.  HENT:  Head: Normocephalic and atraumatic.  Eyes: Conjunctivae and EOM are normal. Pupils are equal, round, and reactive to light.  Fundi show grade 2 hypertensive changes with A-V nicking.   Neck: Normal range of motion. Neck supple. No JVD present. Carotid bruit is not present.  Cardiovascular: Normal rate, regular rhythm and normal heart sounds.   No murmur heard. No carotid bruits.  Pulmonary/Chest: Effort normal and breath sounds normal. She has no wheezes. She has no rales. She exhibits no tenderness.  Abdominal: Soft. Bowel sounds are normal. She exhibits no distension  and no mass. There is no tenderness.  Musculoskeletal: Normal range of motion. She exhibits no edema.  Lymphadenopathy:    She has no cervical adenopathy.  Neurological: She is alert and oriented to person, place, and time. No cranial nerve deficit. She exhibits normal muscle tone. Coordination normal.  Skin: Skin is warm and dry. No rash noted.  Psychiatric: She has a normal mood and affect. Her behavior is normal. Judgment and thought content normal.  Nursing note and vitals reviewed.   ED Course  Procedures (including critical care time)  DIAGNOSTIC STUDIES: Oxygen Saturation is 100% on RA, normal by my interpretation.    COORDINATION OF CARE: 12:45 AM - Discussed treatment plan with pt at bedside which includes EKG and other diagnostic tests, and pt agreed to plan.   Labs Review Results for orders placed or performed during the hospital encounter of 78/29/56  Basic metabolic panel  Result Value Ref Range   Sodium 136 135 - 145 mmol/L   Potassium 3.5 3.5 - 5.1 mmol/L   Chloride 103 101 - 111 mmol/L   CO2 27 22 - 32 mmol/L   Glucose, Bld 203 (H) 70 - 99 mg/dL   BUN 16 6 - 20 mg/dL   Creatinine, Ser 1.27 (H) 0.44 - 1.00 mg/dL   Calcium 8.6 (L) 8.9 - 10.3 mg/dL   GFR calc non Af Amer 44 (L) >60 mL/min   GFR calc Af Amer 51 (L) >60 mL/min   Anion gap 6 5 - 15  CBC with  Differential  Result Value Ref Range   WBC 7.9 4.0 - 10.5 K/uL   RBC 3.98 3.87 - 5.11 MIL/uL   Hemoglobin 10.8 (L) 12.0 - 15.0 g/dL   HCT 33.8 (L) 36.0 - 46.0 %   MCV 84.9 78.0 - 100.0 fL   MCH 27.1 26.0 - 34.0 pg   MCHC 32.0 30.0 - 36.0 g/dL   RDW 13.2 11.5 - 15.5 %   Platelets 228 150 - 400 K/uL   Neutrophils Relative % 73 43 - 77 %   Neutro Abs 5.7 1.7 - 7.7 K/uL   Lymphocytes Relative 20 12 - 46 %   Lymphs Abs 1.6 0.7 - 4.0 K/uL   Monocytes Relative 5 3 - 12 %   Monocytes Absolute 0.4 0.1 - 1.0 K/uL   Eosinophils Relative 2 0 - 5 %   Eosinophils Absolute 0.2 0.0 - 0.7 K/uL   Basophils Relative 0 0 - 1 %   Basophils Absolute 0.0 0.0 - 0.1 K/uL  Troponin I  Result Value Ref Range   Troponin I <0.03 <0.031 ng/mL    EKG Interpretation   Date/Time:  Sunday Aug 04 2014 00:50:22 EDT Ventricular Rate:  84 PR Interval:  174 QRS Duration: 96 QT Interval:  386 QTC Calculation: 456 R Axis:   44 Text Interpretation:  Normal sinus rhythm Possible Anterior infarct , age  undetermined Abnormal ECG When compared with ECG of 02/03/2013, No  significant change was found Confirmed by [facility]  MD, Eiley Mcginnity ([id number]) on  08/04/2014 12:59:09 AM      MDM   Final diagnoses:  Essential hypertension     Severe hypertension. Patient is not showing any evidence of end organ damage. She is observed in the ED. Because of the complaint of transient pain in the right shoulder, ECG is obtained as well as troponin and these are unremarkable. Blood pressures come down substantially with just observation. I will cut over the patient's medication regimen and she  is currently taking clonidine once in the morning. This would be expected to wear off by midafternoon/suspect that she normally is running higher blood pressures in the evening just based on the fact that she does not have the clonidine in her system. I have requested that she check her blood pressure in the evening as well as in the morning and bring  those readings with her at her next physician visit. She may do better on a clonidine patch if she needs to stay on clonidine.   I personally performed the services described in this documentation, which was scribed in my presence. The recorded information has been reviewed and is accurate.      Christine Fuel, MD 02/02/12 1438

## 2014-08-04 NOTE — Discharge Instructions (Signed)
Check your blood pressure twice a day. Keep a record of the readings and bring them to your doctor at your next visit.   Hypertension Hypertension, commonly called high blood pressure, is when the force of blood pumping through your arteries is too strong. Your arteries are the blood vessels that carry blood from your heart throughout your body. A blood pressure reading consists of a higher number over a lower number, such as 110/72. The higher number (systolic) is the pressure inside your arteries when your heart pumps. The lower number (diastolic) is the pressure inside your arteries when your heart relaxes. Ideally you want your blood pressure below 120/80. Hypertension forces your heart to work harder to pump blood. Your arteries may become narrow or stiff. Having hypertension puts you at risk for heart disease, stroke, and other problems.  RISK FACTORS Some risk factors for high blood pressure are controllable. Others are not.  Risk factors you cannot control include:   Race. You may be at higher risk if you are African American.  Age. Risk increases with age.  Gender. Men are at higher risk than women before age 57 years. After age 95, women are at higher risk than men. Risk factors you can control include:  Not getting enough exercise or physical activity.  Being overweight.  Getting too much fat, sugar, calories, or salt in your diet.  Drinking too much alcohol. SIGNS AND SYMPTOMS Hypertension does not usually cause signs or symptoms. Extremely high blood pressure (hypertensive crisis) may cause headache, anxiety, shortness of breath, and nosebleed. DIAGNOSIS  To check if you have hypertension, your health care provider will measure your blood pressure while you are seated, with your arm held at the level of your heart. It should be measured at least twice using the same arm. Certain conditions can cause a difference in blood pressure between your right and left arms. A blood  pressure reading that is higher than normal on one occasion does not mean that you need treatment. If one blood pressure reading is high, ask your health care provider about having it checked again. TREATMENT  Treating high blood pressure includes making lifestyle changes and possibly taking medicine. Living a healthy lifestyle can help lower high blood pressure. You may need to change some of your habits. Lifestyle changes may include:  Following the DASH diet. This diet is high in fruits, vegetables, and whole grains. It is low in salt, red meat, and added sugars.  Getting at least 2 hours of brisk physical activity every week.  Losing weight if necessary.  Not smoking.  Limiting alcoholic beverages.  Learning ways to reduce stress. If lifestyle changes are not enough to get your blood pressure under control, your health care provider may prescribe medicine. You may need to take more than one. Work closely with your health care provider to understand the risks and benefits. HOME CARE INSTRUCTIONS  Have your blood pressure rechecked as directed by your health care provider.   Take medicines only as directed by your health care provider. Follow the directions carefully. Blood pressure medicines must be taken as prescribed. The medicine does not work as well when you skip doses. Skipping doses also puts you at risk for problems.   Do not smoke.   Monitor your blood pressure at home as directed by your health care provider. SEEK MEDICAL CARE IF:   You think you are having a reaction to medicines taken.  You have recurrent headaches or feel dizzy.  You  have swelling in your ankles.  You have trouble with your vision. SEEK IMMEDIATE MEDICAL CARE IF:  You develop a severe headache or confusion.  You have unusual weakness, numbness, or feel faint.  You have severe chest or abdominal pain.  You vomit repeatedly.  You have trouble breathing. MAKE SURE YOU:   Understand  these instructions.  Will watch your condition.  Will get help right away if you are not doing well or get worse. Document Released: 03/22/2005 Document Revised: 08/06/2013 Document Reviewed: 01/12/2013 [facility] Patient Information 2015 [place], [place]. This information is not intended to replace advice given to you by your health care provider. Make sure you discuss any questions you have with your health care provider.

## 2014-08-04 NOTE — ED Notes (Signed)
Discharge instructions given, pt demonstrated teach back and verbal understanding. No concerns voiced.  

## 2014-08-07 ENCOUNTER — Ambulatory Visit (INDEPENDENT_AMBULATORY_CARE_PROVIDER_SITE_OTHER): Payer: Commercial Managed Care - HMO | Admitting: Physician Assistant

## 2014-08-07 ENCOUNTER — Encounter: Payer: Self-pay | Admitting: Physician Assistant

## 2014-08-07 VITALS — BP 164/82 | HR 80 | Ht 61.0 in | Wt 168.0 lb

## 2014-08-07 DIAGNOSIS — E785 Hyperlipidemia, unspecified: Secondary | ICD-10-CM

## 2014-08-07 DIAGNOSIS — I10 Essential (primary) hypertension: Secondary | ICD-10-CM | POA: Diagnosis not present

## 2014-08-07 NOTE — Progress Notes (Signed)
Cardiology Office Note   Date:  08/07/2014   ID:  Christine Dominguez, DOB 12/18/1950, MRN [id number]  PCP:  Christine Nakayama, MD  Cardiologist:  Dr. Johnny Dominguez  Chief Complaint: High blood pressure    History of Present Illness: Christine Dominguez is a 64 y.o. female who presents for follow-up evaluation of hypertension and hyperlipidemia. She was in the emergency room on 08/03/13 when she had some right shoulder pain and took her blood pressure was 200/100. She took an extra clonidine and checked again and it was 970 systolic. She took an extra half of clonidine and went to the emergency room were blood pressure was 130/60. EKG and troponins were negative. Last lipid profile in 01/2014 was stable with a cholesterol of 103 triglycerides 103 HDL 40 LDL 42.  Patient takes her blood pressure twice a day and it usually is 120/50. The highest it's been is 133/68. She does admit to eating a lot of chips on the day that her blood pressure went up. She also had chips this morning. She gets food from the food bank and can't afford fresh foods. She tries to rinse her canned food but eats a large amount of processed foods. She denies any chest pain, palpitations, dyspnea, dizziness or presyncope.    Past Medical History  Diagnosis Date  . Chronic back pain 2007    Disabled   . Obesity   . Hydrocephalus     Treated with VP shunt and then intracranial tumor resection (?Cholesteatoma); no longer followed actively by neurosurgery or otolaryngology  . Type 2 diabetes mellitus 1991  . Essential hypertension, benign 1980  . GERD (gastroesophageal reflux disease) 2000  . Hypothyroidism 2013  . Hyperlipidemia 2013  . Arthritis     Cervical spine  . Neuropathy   . Anemia   . Cancer 1998, recurred in 2012    Paraganglioma of jugular tympamicum, recurrent in 2012    Past Surgical History  Procedure Laterality Date  . Ventriculoperitoneal shunt  1998    At [facility]  . Intracranial mass  resected  1998    At [facility]; ? location-right middle ear  . Hemorrhoidectomy    . Trigger finger release      Rght thumb  . Tubal ligation  1980  . Colonoscopy  06/2010  . Tubal ligation    . Cataract extraction w/phaco Right 07/17/2013    Procedure: CATARACT EXTRACTION PHACO AND INTRAOCULAR LENS PLACEMENT (IOC);  Surgeon: [name], MD;  Location: [facility];  Service: Ophthalmology;  Laterality: Right;  CDE:7.24  . Teeth pulled       Current Outpatient Prescriptions  Medication Sig Dispense Refill  . aspirin EC 81 MG tablet Take 81 mg by mouth at bedtime.    . Camphor-Eucalyptus-Menthol (MEDICATED CHEST RUB) 4.73-1.2-2.6 % OINT     . cloNIDine (CATAPRES) 0.1 MG tablet Take 1 tablet (0.1 mg total) by mouth daily. 180 tablet 1  . docusate sodium (COLACE) 100 MG capsule Take 100 mg by mouth every other day.     . gabapentin (NEURONTIN) 100 MG capsule TAKE 1 CAPSULE TWICE DAILY 180 capsule 1  . glucose (SUNMARK GLUCOSE) 4 GM chewable tablet Chew by mouth.    . hydrochlorothiazide (HYDRODIURIL) 12.5 MG tablet TAKE 1 TABLET EVERY DAY 90 tablet 3  . hydrochlorothiazide (HYDRODIURIL) 25 MG tablet Take 0.5 tablets (12.5 mg total) by mouth daily. 90 tablet 3  . insulin glargine (LANTUS SOLOSTAR) 100 UNIT/ML injection Inject 25 Units into the skin  at bedtime.     . Iron 66 MG TABS Take 1 tablet by mouth 2 (two) times daily.     Marland Kitchen levothyroxine (SYNTHROID, LEVOTHROID) 50 MCG tablet Take 50 mcg by mouth daily before breakfast.    . linagliptin (TRADJENTA) 5 MG TABS tablet Take 5 mg by mouth daily.    . meloxicam (MOBIC) 15 MG tablet One tablet once daily for 5 days only 5 tablet 0  . nystatin (MYCOSTATIN) powder APPLY TOPICALLY AT BEDTIME TO RASH ON LOWER ABDOMEN AS NEEDED  120 g 1  . omeprazole (PRILOSEC) 40 MG capsule TAKE 1 CAPSULE ONE TIME DAILY 90 capsule 1  . ranitidine (ZANTAC) 150 MG tablet Take 150 mg by mouth daily.    . Simethicone (GAS RELIEF DROPS PO) Take by mouth as needed.    .  simvastatin (ZOCOR) 20 MG tablet Take 20 mg by mouth at bedtime.      . sodium chloride (OCEAN) 0.65 % nasal spray     . tiZANidine (ZANAFLEX) 2 MG tablet Take 1 tablet (2 mg total) by mouth daily as needed for muscle spasms. 30 tablet 0  . valsartan (DIOVAN) 160 MG tablet TAKE 1 TABLET EVERY DAY 90 tablet 3   No current facility-administered medications for this visit.    Allergies:   Amaryl; Aspirin; Avandia; Glimepiride; Glipizide; Invokana; and Novolog    Social History:  The patient  reports that she quit smoking about 13 years ago. Her smoking use included Cigarettes. She has a 7.5 pack-year smoking history. She has never used smokeless tobacco. She reports that she does not drink alcohol or use illicit drugs.   Family History:  The patient's   family history includes Diabetes in her brother and sister; Hypertension in her brother and maternal grandmother; Hypertension (age of onset: 41) in her mother; Kidney failure in her brother; Lung cancer in her mother; Other in her son and son.    ROS:  Please see the history of present illness.   Otherwise, review of systems are positive for shunt for hydrocephalus.   All other systems are reviewed and negative.    PHYSICAL EXAM: BP 164/82 mmHg  Pulse 80  Ht 5\' 1"  (1.549 m)  Wt 168 lb (76.204 kg)  BMI 31.76 kg/m2  SpO2 99% GEN: Well nourished, well developed, in no acute distress Neck: no JVD, HJR, carotid bruits, or masses Cardiac: RRR; positive S4, no murmurs, rubs, thrill or heave,  Respiratory:  clear to auscultation bilaterally, normal work of breathing GI: soft, nontender, nondistended, + BS MS: no deformity or atrophy Extremities: Trace of ankle edema bilaterally without cyanosis, clubbing, good distal pulses bilaterally.  Skin: warm and dry, no rash Neuro:  Strength and sensation are intact    EKG:  EKG is not ordered today. The ekg ordered today demonstrates from 08/05/14 reviewed normal sinus rhythm with poor R-wave  progression no acute change   Recent Labs: 01/08/2014: ALT 8 08/04/2014: BUN 16; Creatinine 1.27*; Hemoglobin 10.8*; Platelets 228; Potassium 3.5; Sodium 136    Lipid Panel    Component Value Date/Time   CHOL 103 01/08/2014 0822   TRIG 103 01/08/2014 0822   HDL 40 01/08/2014 0822   CHOLHDL 2.6 01/08/2014 0822   VLDL 21 01/08/2014 0822   LDLCALC 53 06/07/2014      Wt Readings from Last 3 Encounters:  08/04/14 167 lb (75.751 kg)  06/20/14 167 lb (75.751 kg)  05/09/14 167 lb (75.751 kg)      Other studies  Reviewed:    ASSESSMENT AND PLAN:  Essential hypertension, benign Patient had an episode of high blood pressure prompting ER visit. This resolved quickly with taken an extra clonidine. I suspect it's related to her diet. Reviewed 2 g sodium diet with her in detail. She takes her blood pressure twice a day religiously. She will call us if it goes up. Follow-up with Dr. Domenic Polite in 2 months. Consider 2-D echo in the future.   Hyperlipidemia Stable on Zocor. Last lipid panel in 01/2014 stable     Signed, Ermalinda Barrios, PA-C  08/07/2014 11:57 AM    [facility] [address], [place], [place]  [address] Phone: [phone]; Fax: [phone]

## 2014-08-07 NOTE — Assessment & Plan Note (Signed)
Patient had an episode of high blood pressure prompting ER visit. This resolved quickly with taken an extra clonidine. I suspect it's related to her diet. Reviewed 2 g sodium diet with her in detail. She takes her blood pressure twice a day religiously. She will call us if it goes up. Follow-up with Dr. Domenic Polite in 2 months. Consider 2-D echo in the future.

## 2014-08-07 NOTE — Patient Instructions (Signed)
Your physician recommends that you schedule a follow-up appointment in: 2-3 months with Dr.McDowell    Your physician recommends that you continue on your current medications as directed. Please refer to the Current Medication list given to you today.     Please follow 2 Gram low sodium guidelines I have provided for you      Thank you for choosing [facility] !

## 2014-08-07 NOTE — Assessment & Plan Note (Signed)
Stable on Zocor. Last lipid panel in 01/2014 stable

## 2014-08-08 ENCOUNTER — Other Ambulatory Visit: Payer: Self-pay | Admitting: Family Medicine

## 2014-08-08 ENCOUNTER — Ambulatory Visit (INDEPENDENT_AMBULATORY_CARE_PROVIDER_SITE_OTHER): Payer: Commercial Managed Care - HMO | Admitting: Family Medicine

## 2014-08-08 ENCOUNTER — Encounter: Payer: Self-pay | Admitting: Family Medicine

## 2014-08-08 VITALS — BP 160/78 | HR 86 | Resp 16 | Ht 62.0 in | Wt 169.0 lb

## 2014-08-08 DIAGNOSIS — E1065 Type 1 diabetes mellitus with hyperglycemia: Secondary | ICD-10-CM

## 2014-08-08 DIAGNOSIS — Z1231 Encounter for screening mammogram for malignant neoplasm of breast: Secondary | ICD-10-CM

## 2014-08-08 DIAGNOSIS — I10 Essential (primary) hypertension: Secondary | ICD-10-CM

## 2014-08-08 DIAGNOSIS — E559 Vitamin D deficiency, unspecified: Secondary | ICD-10-CM

## 2014-08-08 DIAGNOSIS — E785 Hyperlipidemia, unspecified: Secondary | ICD-10-CM

## 2014-08-08 DIAGNOSIS — Z23 Encounter for immunization: Secondary | ICD-10-CM | POA: Diagnosis not present

## 2014-08-08 DIAGNOSIS — Z0000 Encounter for general adult medical examination without abnormal findings: Secondary | ICD-10-CM

## 2014-08-08 DIAGNOSIS — H9191 Unspecified hearing loss, right ear: Secondary | ICD-10-CM

## 2014-08-08 DIAGNOSIS — E1165 Type 2 diabetes mellitus with hyperglycemia: Secondary | ICD-10-CM

## 2014-08-08 DIAGNOSIS — D508 Other iron deficiency anemias: Secondary | ICD-10-CM

## 2014-08-08 DIAGNOSIS — IMO0001 Reserved for inherently not codable concepts without codable children: Secondary | ICD-10-CM

## 2014-08-08 DIAGNOSIS — Z794 Long term (current) use of insulin: Secondary | ICD-10-CM

## 2014-08-08 NOTE — Assessment & Plan Note (Signed)

## 2014-08-08 NOTE — Progress Notes (Signed)
Subjective:    Patient ID: Christine Dominguez, female    DOB: 1951/02/23, 64 y.o.   MRN: [id number]  HPI Preventive Screening-Counseling & Management   Patient present here today for a Medicare annual wellness visit.   Current Problems (verified)   Medications Prior to Visit Allergies (verified)   PAST HISTORY  Family History (verified)   Social History Widow x 2 years. No children living , her only son was killed at age 15. Disabled since age 20, for back pain   Risk Factors  Current exercise habits:  Walks a few days a week when able but will try to start doing more   Dietary issues discussed: Heart healthy diet discussed, eating more fruits and vegetables and limiting carbs    Cardiac risk factors: IDDM   Depression Screen  (Note: if answer to either of the following is "Yes", a more complete depression screening is indicated)   Over the past two weeks, have you felt down, depressed or hopeless? No  Over the past two weeks, have you felt little interest or pleasure in doing things? No  Have you lost interest or pleasure in daily life? No  Do you often feel hopeless? No  Do you cry easily over simple problems? No   Activities of Daily Living  In your present state of health, do you have any difficulty performing the following activities?  Driving?: No Managing money?: No Feeding yourself?:No Getting from bed to chair?:No Climbing a flight of stairs?:No, can't do many but can climb a few  Preparing food and eating?:No Bathing or showering?:No, has a bar to hold to  Getting dressed?:No Getting to the toilet?:No Using the toilet?:No Moving around from place to place?: Not usually  Fall Risk Assessment In the past year have you fallen or had a near fall?:No Are you currently taking any medications that make you dizzy?:No   Hearing Difficulties: No Do you often ask people to speak up or repeat themselves?: no  Do you experience ringing or noises in your  ears?: all the time  Do you have difficulty understanding soft or whispered voices?: some trouble on her right side   Cognitive Testing  Alert? Yes Normal Appearance?Yes  Oriented to person? Yes Place? Yes  Time? Yes  Displays appropriate judgment?Yes  Can read the correct time from a watch face? yes Are you having problems remembering things?No  Advanced Directives have been discussed with the patient?Yes, no living will but brochure given    List the Names of Other Physician/Practitioners you currently use:  Dr Gershon Crane (opthl) Dr Domenic Polite (cardio) Dr Dorris Fetch (endo)  Indicate any recent Medical Services you may have received from other than [facility] providers in the past year (date may be approximate).   Assessment:    Annual Wellness Exam   Plan:     Medicare Attestation  I have personally reviewed:  The patient's medical and social history  Their use of alcohol, tobacco or illicit drugs  Their current medications and supplements  The patient's functional ability including ADLs,fall risks, home safety risks, cognitive, and hearing and visual impairment  Diet and physical activities  Evidence for depression or mood disorders  The patient's weight, height, BMI, and visual acuity have been recorded in the chart. I have made referrals, counseling, and provided education to the patient based on review of the above and I have provided the patient with a written personalized care plan for preventive services.      Review of Systems  Objective:   Physical Exam BP 160/78 mmHg  Pulse 86  Resp 16  Ht 5\' 2"  (1.575 m)  Wt 169 lb (76.658 kg)  BMI 30.90 kg/m2  SpO2 98%        Assessment & Plan:  Medicare annual wellness visit, subsequent Annual exam as documented. Counseling done  re healthy lifestyle involving commitment to 150 minutes exercise per week, heart healthy diet, and attaining healthy weight.The importance of adequate sleep also discussed. Regular seat belt  use and home safety, is also discussed. Changes in health habits are decided on by the patient with goals and time frames  set for achieving them. Immunization and cancer screening needs are specifically addressed at this visit.    Need for vaccination with 13-polyvalent pneumococcal conjugate vaccine After obtaining informed consent, the vaccine is  administered by LPN.

## 2014-08-08 NOTE — Patient Instructions (Signed)
F/u in 5 month, please call if you need me before  Prevnar today  Fasting labs in June same day as you are getting for Dr Dorris Fetch please  Please use tylenol rather than any other OTC medication for pain, 325 mg or 500 mg  one to two in a day if needed is OK.   I am thankful that things are working out well for you as far as housing is concerned  Mammo due in June and will be ordered  Please let us know if you will need a referral to ENT, we will also send for last note in 07/2013  Thanks for choosing [facility], we consider it a privelige to serve you. Please work on blood pressure and blood sugar as you are doing!

## 2014-08-09 NOTE — Assessment & Plan Note (Signed)
After obtaining informed consent, the vaccine is  administered by LPN.  

## 2014-08-15 ENCOUNTER — Emergency Department (HOSPITAL_COMMUNITY): Payer: Commercial Managed Care - HMO

## 2014-08-15 ENCOUNTER — Encounter: Payer: Self-pay | Admitting: Family Medicine

## 2014-08-15 ENCOUNTER — Emergency Department (HOSPITAL_COMMUNITY)
Admission: EM | Admit: 2014-08-15 | Discharge: 2014-08-15 | Disposition: A | Discharge status: Home / Self Care | Payer: Commercial Managed Care - HMO | Attending: Emergency Medicine | Admitting: Emergency Medicine

## 2014-08-15 ENCOUNTER — Encounter (HOSPITAL_COMMUNITY): Payer: Self-pay | Admitting: *Deleted

## 2014-08-15 DIAGNOSIS — Y92008 Other place in unspecified non-institutional (private) residence as the place of occurrence of the external cause: Secondary | ICD-10-CM | POA: Insufficient documentation

## 2014-08-15 DIAGNOSIS — E119 Type 2 diabetes mellitus without complications: Secondary | ICD-10-CM | POA: Insufficient documentation

## 2014-08-15 DIAGNOSIS — M79605 Pain in left leg: Secondary | ICD-10-CM | POA: Diagnosis not present

## 2014-08-15 DIAGNOSIS — M1990 Unspecified osteoarthritis, unspecified site: Secondary | ICD-10-CM | POA: Insufficient documentation

## 2014-08-15 DIAGNOSIS — Z79899 Other long term (current) drug therapy: Secondary | ICD-10-CM | POA: Insufficient documentation

## 2014-08-15 DIAGNOSIS — E785 Hyperlipidemia, unspecified: Secondary | ICD-10-CM | POA: Insufficient documentation

## 2014-08-15 DIAGNOSIS — G8929 Other chronic pain: Secondary | ICD-10-CM | POA: Diagnosis not present

## 2014-08-15 DIAGNOSIS — Y9389 Activity, other specified: Secondary | ICD-10-CM | POA: Insufficient documentation

## 2014-08-15 DIAGNOSIS — Z7982 Long term (current) use of aspirin: Secondary | ICD-10-CM | POA: Diagnosis not present

## 2014-08-15 DIAGNOSIS — S8012XA Contusion of left lower leg, initial encounter: Secondary | ICD-10-CM | POA: Diagnosis not present

## 2014-08-15 DIAGNOSIS — E669 Obesity, unspecified: Secondary | ICD-10-CM | POA: Diagnosis not present

## 2014-08-15 DIAGNOSIS — Z794 Long term (current) use of insulin: Secondary | ICD-10-CM | POA: Insufficient documentation

## 2014-08-15 DIAGNOSIS — G629 Polyneuropathy, unspecified: Secondary | ICD-10-CM | POA: Insufficient documentation

## 2014-08-15 DIAGNOSIS — Y998 Other external cause status: Secondary | ICD-10-CM | POA: Insufficient documentation

## 2014-08-15 DIAGNOSIS — Z87891 Personal history of nicotine dependence: Secondary | ICD-10-CM | POA: Insufficient documentation

## 2014-08-15 DIAGNOSIS — W07XXXA Fall from chair, initial encounter: Secondary | ICD-10-CM | POA: Diagnosis not present

## 2014-08-15 DIAGNOSIS — Z8603 Personal history of neoplasm of uncertain behavior: Secondary | ICD-10-CM | POA: Insufficient documentation

## 2014-08-15 DIAGNOSIS — K219 Gastro-esophageal reflux disease without esophagitis: Secondary | ICD-10-CM | POA: Insufficient documentation

## 2014-08-15 DIAGNOSIS — I10 Essential (primary) hypertension: Secondary | ICD-10-CM | POA: Diagnosis not present

## 2014-08-15 DIAGNOSIS — Z862 Personal history of diseases of the blood and blood-forming organs and certain disorders involving the immune mechanism: Secondary | ICD-10-CM | POA: Insufficient documentation

## 2014-08-15 DIAGNOSIS — E039 Hypothyroidism, unspecified: Secondary | ICD-10-CM | POA: Diagnosis not present

## 2014-08-15 DIAGNOSIS — S8992XA Unspecified injury of left lower leg, initial encounter: Secondary | ICD-10-CM | POA: Diagnosis not present

## 2014-08-15 LAB — CBG MONITORING, ED: GLUCOSE-CAPILLARY: 176 mg/dL — AB (ref 65–99)

## 2014-08-15 MED ORDER — BACITRACIN-NEOMYCIN-POLYMYXIN 400-5-5000 EX OINT
TOPICAL_OINTMENT | Freq: Once | CUTANEOUS | Status: AC
  Administered 2014-08-15: 1 via TOPICAL
  Filled 2014-08-15: qty 1

## 2014-08-15 NOTE — Discharge Instructions (Signed)
Contusion °A contusion is a deep bruise. Contusions happen when an injury causes bleeding under the skin. Signs of bruising include pain, puffiness (swelling), and discolored skin. The contusion may turn blue, purple, or yellow. °HOME CARE  °· Put ice on the injured area. °¨ Put ice in a plastic bag. °¨ Place a towel between your skin and the bag. °¨ Leave the ice on for 15-20 minutes, 03-04 times a day. °· Only take medicine as told by your doctor. °· Rest the injured area. °· If possible, raise (elevate) the injured area to lessen puffiness. °GET HELP RIGHT AWAY IF:  °· You have more bruising or puffiness. °· You have pain that is getting worse. °· Your puffiness or pain is not helped by medicine. °MAKE SURE YOU:  °· Understand these instructions. °· Will watch your condition. °· Will get help right away if you are not doing well or get worse. °Document Released: 09/08/2007 Document Revised: 06/14/2011 Document Reviewed: 01/25/2011 °ExitCare® Patient Information ©2015 [employer]. This information is not intended to replace advice given to you by your health care provider. Make sure you discuss any questions you have with your health care provider. ° °

## 2014-08-15 NOTE — ED Provider Notes (Signed)
CSN: [id number]     Arrival date & time 08/15/14  1635 History   First MD Initiated Contact with Patient 08/15/14 1725     Chief Complaint  Patient presents with  . Leg Injury     (Consider location/radiation/quality/duration/timing/severity/associated sxs/prior Treatment) Patient is a 64 y.o. female presenting with leg pain. The history is provided by the patient.  Leg Pain Location:  Leg Injury: yes   Mechanism of injury: fall   Fall:    Fall occurred: off a chair.   Impact surface:  Hard floor   Entrapped after fall: no   Leg location:  L leg Pain details:    Quality:  Aching and shooting   Radiates to:  Does not radiate   Onset quality:  Sudden   Timing:  Constant Chronicity:  New Dislocation: no   Foreign body present:  No foreign bodies Worsened by:  Nothing tried Ineffective treatments:  None tried Associated symptoms: swelling    Christine Dominguez is a 64 y.o. female who presents to the ED with pain to the left lower leg. She states she was standing on a chair to hang a curtain and fell. She hit her left lower leg and has bruising and swelling.   Past Medical History  Diagnosis Date  . Chronic back pain 2007    Disabled   . Obesity   . Hydrocephalus     Treated with VP shunt and then intracranial tumor resection (?Cholesteatoma); no longer followed actively by neurosurgery or otolaryngology  . Type 2 diabetes mellitus 1991  . Essential hypertension, benign 1980  . GERD (gastroesophageal reflux disease) 2000  . Hypothyroidism 2013  . Hyperlipidemia 2013  . Arthritis     Cervical spine  . Neuropathy   . Anemia   . Cancer 1998, recurred in 2012    Paraganglioma of jugular tympamicum, recurrent in 2012   Past Surgical History  Procedure Laterality Date  . Ventriculoperitoneal shunt  1998    At [facility]  . Intracranial mass resected  1998    At [facility]; ? location-right middle ear  . Hemorrhoidectomy    . Trigger finger release      Rght thumb  .  Tubal ligation  1980  . Colonoscopy  06/2010  . Tubal ligation    . Cataract extraction w/phaco Right 07/17/2013    Procedure: CATARACT EXTRACTION PHACO AND INTRAOCULAR LENS PLACEMENT (IOC);  Surgeon: [name], MD;  Location: [facility];  Service: Ophthalmology;  Laterality: Right;  CDE:7.24  . Teeth pulled     Family History  Problem Relation Age of Onset  . Lung cancer Mother   . Hypertension Mother 60  . Kidney failure Brother   . Diabetes Sister   . Diabetes Brother   . Hypertension Brother   . Hypertension Maternal Grandmother   . Other Son     stillborn  . Other Son     died at birth   History  Substance Use Topics  . Smoking status: Former Smoker -- 0.50 packs/day for 15 years    Types: Cigarettes    Quit date: 07/07/2001  . Smokeless tobacco: Never Used  . Alcohol Use: No   OB History    Gravida Para Term Preterm AB TAB SAB Ectopic Multiple Living   3 2   1   1  1      Review of Systems Negative except as stated in HPI   Allergies  Amaryl; Aspirin; Avandia; Glimepiride; Glipizide; Invokana; and Novolog  Home Medications   Prior to Admission medications   Medication Sig Start Date End Date Taking? Authorizing Provider  aspirin EC 81 MG tablet Take 81 mg by mouth at bedtime.    Historical Provider, MD  Camphor-Eucalyptus-Menthol (MEDICATED CHEST RUB) 4.73-1.2-2.6 % OINT  12/04/13   Historical Provider, MD  cloNIDine (CATAPRES) 0.1 MG tablet Take 1 tablet (0.1 mg total) by mouth daily. 08/28/13   [name], MD  docusate sodium (COLACE) 100 MG capsule Take 100 mg by mouth every other day.     Historical Provider, MD  gabapentin (NEURONTIN) 100 MG capsule TAKE 1 CAPSULE TWICE DAILY 07/03/14   [name], MD  glucose [facility] GLUCOSE) 4 GM chewable tablet Chew by mouth. 07/29/11   Historical Provider, MD  hydrochlorothiazide (HYDRODIURIL) 12.5 MG tablet TAKE 1 TABLET EVERY DAY 05/16/14   Satira Sark, MD  hydrochlorothiazide (HYDRODIURIL) 25 MG  tablet Take 0.5 tablets (12.5 mg total) by mouth daily. 08/30/13   Satira Sark, MD  insulin glargine (LANTUS SOLOSTAR) 100 UNIT/ML injection Inject 25 Units into the skin at bedtime.     Historical Provider, MD  Iron 66 MG TABS Take 1 tablet by mouth 2 (two) times daily.     Historical Provider, MD  levothyroxine (SYNTHROID, LEVOTHROID) 50 MCG tablet Take 50 mcg by mouth daily before breakfast.    Historical Provider, MD  linagliptin (TRADJENTA) 5 MG TABS tablet Take 5 mg by mouth daily.    Historical Provider, MD  nystatin (MYCOSTATIN) powder APPLY TOPICALLY AT BEDTIME TO RASH ON LOWER ABDOMEN AS NEEDED  05/17/14   [name], MD  omeprazole (PRILOSEC) 40 MG capsule TAKE 1 CAPSULE ONE TIME DAILY 07/03/14   [name], MD  ranitidine (ZANTAC) 150 MG tablet Take 150 mg by mouth daily.    Historical Provider, MD  Simethicone (GAS RELIEF DROPS PO) Take by mouth as needed.    Historical Provider, MD  simvastatin (ZOCOR) 20 MG tablet Take 20 mg by mouth at bedtime.      Historical Provider, MD  sodium chloride (OCEAN) 0.65 % nasal spray  12/04/13   Historical Provider, MD  tiZANidine (ZANAFLEX) 2 MG tablet Take 1 tablet (2 mg total) by mouth daily as needed for muscle spasms. 08/01/13   [name], MD  valsartan (DIOVAN) 160 MG tablet TAKE 1 TABLET EVERY DAY 08/30/13   Satira Sark, MD   BP 155/85 mmHg  Pulse 91  Temp(Src) 98 F (36.7 C) (Oral)  Resp 16  Ht 5\' 2"  (1.575 m)  Wt 169 lb (76.658 kg)  BMI 30.90 kg/m2  SpO2 100% Physical Exam  Constitutional: She is oriented to person, place, and time. She appears well-developed and well-nourished. No distress.  HENT:  Head: Normocephalic.  Eyes: EOM are normal.  Neck: Normal range of motion. Neck supple.  Cardiovascular: Normal rate.   Pulmonary/Chest: Effort normal.  Musculoskeletal: Normal range of motion.       Legs: Abrasions, ecchymosis and swelling to the left lower leg. Pedal pulse 2+, adequate circulation,  good touch sensation.   Neurological: She is alert and oriented to person, place, and time. No cranial nerve deficit.  Skin: Skin is warm and dry.  Psychiatric: She has a normal mood and affect. Her behavior is normal.  Nursing note and vitals reviewed.   ED Course  Procedures (including critical care time) Lab, x-ray, wound care, ace wrap,ice, elevation  Labs Review Labs Reviewed  CBG MONITORING, ED - Abnormal;  Notable for the following:    Glucose-Capillary 176 (*)    All other components within normal limits    Imaging Review Dg Tibia/fibula Left  08/15/2014   CLINICAL DATA:  Midshaft left lower leg pain, hit leg on chair  EXAM: LEFT TIBIA AND FIBULA - 2 VIEW  COMPARISON:  None.  FINDINGS: There is no evidence of fracture or other focal bone lesions. Soft tissues are unremarkable.  IMPRESSION: Negative.   Electronically Signed   By: Lahoma Crocker M.D.   On: 08/15/2014 18:11    MDM  64 y.o. female with left lower leg pain s/p fall earlier today. Stable for d/c without fracture on x-ray, no neurovascular compromise. Patient ambulatory without difficulty.  Final diagnoses:  Contusion of left lower leg, initial encounter       [facility], NP 08/15/14 1939  Veryl Speak, MD 08/18/14 [phone]

## 2014-08-15 NOTE — ED Notes (Signed)
Pt was trying to stand on chair to hang a curtain. States he leg slipped and hit the chair. Pain to lower leg.

## 2014-08-26 ENCOUNTER — Encounter: Payer: Commercial Managed Care - HMO | Attending: "Endocrinology | Admitting: Nutrition

## 2014-08-26 VITALS — Ht 61.5 in | Wt 167.6 lb

## 2014-08-26 DIAGNOSIS — E118 Type 2 diabetes mellitus with unspecified complications: Secondary | ICD-10-CM | POA: Diagnosis not present

## 2014-08-26 DIAGNOSIS — Z794 Long term (current) use of insulin: Secondary | ICD-10-CM | POA: Diagnosis not present

## 2014-08-26 DIAGNOSIS — E669 Obesity, unspecified: Secondary | ICD-10-CM | POA: Diagnosis not present

## 2014-08-26 DIAGNOSIS — Z6831 Body mass index (BMI) 31.0-31.9, adult: Secondary | ICD-10-CM | POA: Insufficient documentation

## 2014-08-26 DIAGNOSIS — Z713 Dietary counseling and surveillance: Secondary | ICD-10-CM | POA: Insufficient documentation

## 2014-08-26 DIAGNOSIS — IMO0002 Reserved for concepts with insufficient information to code with codable children: Secondary | ICD-10-CM

## 2014-08-26 DIAGNOSIS — E1165 Type 2 diabetes mellitus with hyperglycemia: Secondary | ICD-10-CM

## 2014-08-26 NOTE — Patient Instructions (Addendum)
Goals:   Cut out diet sodas and only drink water. Avoid fried and fast foods. Increase fiber rich foods Increase water intake Increase low carb vegetables to 2 servings per meal Decrease portions of corn, peas, rice and potatoes. Lose 1 lb per week. Need to aim for 30-60 minutes of exercise 3-4 times per week to provide desired weight loss. Get A1C 7% to minimize complications from DM. Dont' forget insulin dose at night.

## 2014-08-26 NOTE — Progress Notes (Signed)
  Medical Nutrition Therapy:  Appt start time: 1030 end time:  1100.  Assessment:  Primary concerns today: Diabetes follow up. Not recent A1C. Changes. FBS: 100-150 lbs Bedtime BS 165-236 mg/dl  Still drinking some diet sodas. Drinking more water too and just 1-2 cups of coffee. Has cut out a lot of her sweets. Walking more now. Has changed her eating habits to eating baked and broiled foods and more healthier vegetables..Eating better balanced meals now.   Most recent A1C 8. 9%. It has not changed since last visit. She takes  30 units of Lantus daily and Trajenta 5 mg.       She has not made a lot of changes yet. She notes she is stressed at work and hasn't been able to exercise. Food choices still higher in fat at times and  Excessive CHO's than needed as evidenced by elevated A1C and lack of weight loss. Admits she knows what to do, she just needs to do it. Didn't bring blood sugar log or meter.   Preferred Learning Style:     No preference indicated   Learning Readiness:       Reade  Change in progress  MEDICATIONS: See list   DIETARY INTAKE:  24-hr recall:  B ( AM): Oatmeal with blueberries with cinnamon and coffee and water Snk ( AM): L ( PM): Soup- red pepper soup, mixed vegetables, 4 crackers with pb, Snk ( PM): none D ( PM):Tuna salad, bread, green beans, water,  Snk ( PM): none Beverages: water,   Usual physical activity:  Walk and housework  Estimated energy needs: 1500 calories 170 g carbohydrates 112 g protein 42 g fat  Progress Towards Goal(s):  In progress.   Nutritional Diagnosis:  NB-1.1 Food and nutrition-related knowledge deficit As related to Diabetes.  As evidenced by A1C >8%.    Intervention:  Nutrition Counseling and diabetes education on diet, meal planning, CHO counting, portion control, target ranges for blood sugars, treatment of hyper/hypoglycemia and prevention of complications and benefits of weight loss.  Goals:   Cut out diet sodas and  only drink water. Avoid fried and fast foods. Increase fiber rich foods Increase water intake Increase low carb vegetables to 2 servings per meal Decrease portions of corn, peas, rice and potatoes. Lose 1 lb per week. Need to aim for 30-60 minutes of exercise 3-4 times per week to provide desired weight loss. Get A1C 7% to minimize complications from DM. Dont' forget insulin dose at night.  Teaching Method Utilized:  Visual Auditory Hands on  Handouts given during visit include: The Plate Method Carb Counting and Food Label handouts Meal Plan Card   Barriers to learning/adherence to lifestyle change: none  Demonstrated degree of understanding via:  Teach Back   Monitoring/Evaluation:  Dietary intake, exercise, meal planning, SBG, and body weight in  PRN. She notes she doesn't want to reschedule a follow up appointment. She states she knows what she needs to do and just needs to do it. A1C unchanged from last visit. Hasn't lost any significant weight due to lack of exercise and consistent diet changes most likely.

## 2014-09-05 ENCOUNTER — Other Ambulatory Visit: Payer: Self-pay | Admitting: Family Medicine

## 2014-09-09 DIAGNOSIS — Z713 Dietary counseling and surveillance: Secondary | ICD-10-CM | POA: Diagnosis not present

## 2014-09-09 DIAGNOSIS — I10 Essential (primary) hypertension: Secondary | ICD-10-CM | POA: Diagnosis not present

## 2014-09-09 DIAGNOSIS — E1165 Type 2 diabetes mellitus with hyperglycemia: Secondary | ICD-10-CM | POA: Diagnosis not present

## 2014-09-09 DIAGNOSIS — E038 Other specified hypothyroidism: Secondary | ICD-10-CM | POA: Diagnosis not present

## 2014-09-09 DIAGNOSIS — E782 Mixed hyperlipidemia: Secondary | ICD-10-CM | POA: Diagnosis not present

## 2014-09-09 LAB — BASIC METABOLIC PANEL
CREATININE: 1.4 mg/dL — AB (ref <=1.1)
GLUCOSE: 138 mg/dL
POTASSIUM: 4 mmol/L (ref 3.4–5.3)
Sodium: 140 mmol/L (ref 137–147)

## 2014-09-09 LAB — HEPATIC FUNCTION PANEL
ALT: 12 U/L (ref 7–35)
AST: 14 U/L (ref 13–35)
Alkaline Phosphatase: 74 U/L (ref 25–125)
Bilirubin, Total: 0.3 mg/dL

## 2014-09-09 LAB — HEMOGLOBIN A1C: Hgb A1c MFr Bld: 9.3 % — AB (ref 4.0–6.0)

## 2014-09-09 LAB — TSH: TSH: 0.9 u[IU]/mL (ref <=5.90)

## 2014-09-16 ENCOUNTER — Ambulatory Visit (HOSPITAL_COMMUNITY)
Admission: RE | Admit: 2014-09-16 | Discharge: 2014-09-16 | Disposition: A | Discharge status: Home / Self Care | Payer: Commercial Managed Care - HMO | Source: Ambulatory Visit | Attending: Family Medicine | Admitting: Family Medicine

## 2014-09-16 DIAGNOSIS — E785 Hyperlipidemia, unspecified: Secondary | ICD-10-CM | POA: Diagnosis not present

## 2014-09-16 DIAGNOSIS — Z1231 Encounter for screening mammogram for malignant neoplasm of breast: Secondary | ICD-10-CM | POA: Insufficient documentation

## 2014-09-16 DIAGNOSIS — I10 Essential (primary) hypertension: Secondary | ICD-10-CM | POA: Diagnosis not present

## 2014-09-16 DIAGNOSIS — E559 Vitamin D deficiency, unspecified: Secondary | ICD-10-CM | POA: Diagnosis not present

## 2014-09-16 DIAGNOSIS — E6609 Other obesity due to excess calories: Secondary | ICD-10-CM | POA: Diagnosis not present

## 2014-09-16 DIAGNOSIS — E1122 Type 2 diabetes mellitus with diabetic chronic kidney disease: Secondary | ICD-10-CM | POA: Diagnosis not present

## 2014-09-23 DIAGNOSIS — I10 Essential (primary) hypertension: Secondary | ICD-10-CM | POA: Diagnosis not present

## 2014-09-23 DIAGNOSIS — E785 Hyperlipidemia, unspecified: Secondary | ICD-10-CM | POA: Diagnosis not present

## 2014-09-23 DIAGNOSIS — E1122 Type 2 diabetes mellitus with diabetic chronic kidney disease: Secondary | ICD-10-CM | POA: Diagnosis not present

## 2014-09-30 ENCOUNTER — Ambulatory Visit: Payer: Commercial Managed Care - HMO | Admitting: Podiatry

## 2014-10-10 ENCOUNTER — Ambulatory Visit: Payer: Commercial Managed Care - HMO | Admitting: Cardiology

## 2014-11-04 ENCOUNTER — Encounter: Payer: Self-pay | Admitting: Cardiology

## 2014-11-04 ENCOUNTER — Encounter: Payer: Commercial Managed Care - HMO | Admitting: Cardiology

## 2014-11-04 NOTE — Progress Notes (Signed)
Patient canceled.  This encounter was created in error - please disregard. 

## 2014-11-11 ENCOUNTER — Encounter: Payer: Commercial Managed Care - HMO | Admitting: Family Medicine

## 2014-11-11 ENCOUNTER — Ambulatory Visit (INDEPENDENT_AMBULATORY_CARE_PROVIDER_SITE_OTHER): Payer: Commercial Managed Care - HMO | Admitting: Family Medicine

## 2014-11-11 ENCOUNTER — Encounter: Payer: Self-pay | Admitting: Family Medicine

## 2014-11-11 VITALS — BP 170/80 | HR 74 | Resp 16 | Wt 168.0 lb

## 2014-11-11 DIAGNOSIS — Z1159 Encounter for screening for other viral diseases: Secondary | ICD-10-CM | POA: Diagnosis not present

## 2014-11-11 DIAGNOSIS — E1165 Type 2 diabetes mellitus with hyperglycemia: Secondary | ICD-10-CM

## 2014-11-11 DIAGNOSIS — E038 Other specified hypothyroidism: Secondary | ICD-10-CM

## 2014-11-11 DIAGNOSIS — E1065 Type 1 diabetes mellitus with hyperglycemia: Secondary | ICD-10-CM | POA: Diagnosis not present

## 2014-11-11 DIAGNOSIS — D447 Neoplasm of uncertain behavior of aortic body and other paraganglia: Secondary | ICD-10-CM

## 2014-11-11 DIAGNOSIS — E785 Hyperlipidemia, unspecified: Secondary | ICD-10-CM

## 2014-11-11 DIAGNOSIS — I10 Essential (primary) hypertension: Secondary | ICD-10-CM | POA: Diagnosis not present

## 2014-11-11 DIAGNOSIS — Z794 Long term (current) use of insulin: Secondary | ICD-10-CM

## 2014-11-11 DIAGNOSIS — IMO0001 Reserved for inherently not codable concepts without codable children: Secondary | ICD-10-CM

## 2014-11-11 MED ORDER — VALSARTAN 320 MG PO TABS: 320.0000 mg | ORAL_TABLET | Freq: Every day | ORAL | Status: DC

## 2014-11-11 NOTE — Assessment & Plan Note (Signed)
Has annual ENT f/u scheduled in the next 66 to 8 weeks

## 2014-11-11 NOTE — Progress Notes (Signed)
Christine Dominguez     MRN: [id number]      DOB: 12/19/1950   HPI Christine Dominguez is here because of uncontrolled blood pressure , she was sent from the dentist as she was unable to have dental extraction done. At this visit , I am also  Following up  Her hronic medical conditions, medication management and review of any available recent lab and radiology data.  Preventive health is updated, specifically  Cancer screening and Immunization.   Questions or concerns regarding consultations or procedures which the PT has had in the interim are  Addressed.needs referral for eye and foot exams, has upcoming annual [facility] appt at [facility].already scheduled and approved Blood sugar is uncontrolled, states diet proposed is unrealistic and sites this as the cause of her problems but states she is working on this   ROS Denies recent fever or chills. Denies sinus pressure, nasal congestion, ear pain or sore throat. Denies chest congestion, productive cough or wheezing. Denies chest pains, palpitations and leg swelling Denies abdominal pain, nausea, vomiting,diarrhea or constipation.   Denies dysuria, frequency, hesitancy or incontinence. Chronic back pain and intermittent  limitation in mobility. Denies headaches, seizures, numbness, or tingling. Denies depression, anxiety or insomnia. Denies skin break down or rash.   PE  BP 170/80 mmHg  Pulse 74  Resp 16  Wt 168 lb (76.204 kg)  Patient alert and oriented and in no cardiopulmonary distress.  HEENT: No facial asymmetry, EOMI,   oropharynx pink and moist.  Neck supple no JVD, no mass.  Chest: Clear to auscultation bilaterally.  CVS: S1, S2 no murmurs, no S3.Regular rate.  ABD: Soft non tender.   Ext: No edema  MS: Adequate ROM spine, shoulders, hips and knees.  Skin: Intact, no ulcerations or rash noted.  Psych: Good eye contact, normal affect. Memory intact not anxious or depressed appearing.  CNS: CN 2-12 intact, power,  normal  throughout.no focal deficits noted.   Assessment & Plan   Essential hypertension, benign Uncontrolled, increase losartan, nurse BP recheck in 10 days DASH diet and commitment to daily physical activity for a minimum of 30 minutes discussed and encouraged, as a part of hypertension management. The importance of attaining a healthy weight is also discussed.  BP/Weight 11/11/2014 08/26/2014 08/15/2014 08/08/2014 08/07/2014 08/04/2014 0/16/0109  Systolic BP 323 - 557 322 025 427 -  Diastolic BP 80 - 85 78 82 57 -  Wt. (Lbs) 168 167.6 169 169 168 167 167  BMI 31.23 31.16 30.9 30.9 31.76 31.05 31.57        Hyperlipidemia LDL goal <100 Updated lab needed at/ before next visit. Hyperlipidemia:Low fat diet discussed and encouraged.   Lipid Panel  Lab Results  Component Value Date   CHOL 103 01/08/2014   HDL 40 01/08/2014   LDLCALC 53 06/07/2014   TRIG 103 01/08/2014   CHOLHDL 2.6 01/08/2014        Hypothyroid Treated by endo, and reportedly controlled  Diabetes mellitus, insulin dependent (IDDM), uncontrolled Deteriorated  Managed by endo and has close f/u planned Christine Dominguez is reminded of the importance of commitment to daily physical activity for 30 minutes or more, as able and the need to limit carbohydrate intake to 30 to 60 grams per meal to help with blood sugar control.   The need to take medication as prescribed, test blood sugar as directed, and to call between visits if there is a concern that blood sugar is uncontrolled is also discussed.   [name] is reminded of the importance of daily foot exam, annual eye examination, and good blood sugar, blood pressure and cholesterol control.  Diabetic Labs Latest Ref Rng 09/09/2014 08/04/2014 06/07/2014 01/08/2014 12/12/2013  HbA1c 4.0 - 6.0 % 9.3(A) - - - -  Microalbumin 0.00 - 1.89 mg/dL - - - - 0.50  Micro/Creat Ratio 0.0 - 30.0 mg/g - - - - 7.0  Chol 0 - 200 mg/dL - - - 103 -  HDL >39 mg/dL - - - 40 -  Calc  LDL - - - 53 42 -  Triglycerides <150 mg/dL - - - 103 -  Creatinine .5 - 1.1 mg/dL 1.4(A) 1.27(H) - - -   BP/Weight 11/11/2014 08/26/2014 08/15/2014 08/08/2014 08/07/2014 08/04/2014 12/20/9148  Systolic BP 569 - 794 801 655 374 -  Diastolic BP 80 - 85 78 82 57 -  Wt. (Lbs) 168 167.6 169 169 168 167 167  BMI 31.23 31.16 30.9 30.9 31.76 31.05 31.57   Foot/eye exam completion dates 12/12/2013 10/23/2012  Foot exam Order - -  Foot Form Completion Done Done         Paraganglioma Has annual ENT f/u scheduled in the next 66 to 8 weeks

## 2014-11-11 NOTE — Assessment & Plan Note (Signed)
Uncontrolled, increase losartan, nurse BP recheck in 10 days DASH diet and commitment to daily physical activity for a minimum of 30 minutes discussed and encouraged, as a part of hypertension management. The importance of attaining a healthy weight is also discussed.  BP/Weight 11/11/2014 08/26/2014 08/15/2014 08/08/2014 08/07/2014 08/04/2014 2/63/7858  Systolic BP 850 - 277 412 878 676 -  Diastolic BP 80 - 85 78 82 57 -  Wt. (Lbs) 168 167.6 169 169 168 167 167  BMI 31.23 31.16 30.9 30.9 31.76 31.05 31.57

## 2014-11-11 NOTE — Assessment & Plan Note (Signed)
Treated by endo, and reportedly controlled

## 2014-11-11 NOTE — Assessment & Plan Note (Signed)
Deteriorated  Managed by endo and has close f/u planned Christine Dominguez is reminded of the importance of commitment to daily physical activity for 30 minutes or more, as able and the need to limit carbohydrate intake to 30 to 60 grams per meal to help with blood sugar control.   The need to take medication as prescribed, test blood sugar as directed, and to call between visits if there is a concern that blood sugar is uncontrolled is also discussed.   Christine Dominguez is reminded of the importance of daily foot exam, annual eye examination, and good blood sugar, blood pressure and cholesterol control.  Diabetic Labs Latest Ref Rng 09/09/2014 08/04/2014 06/07/2014 01/08/2014 12/12/2013  HbA1c 4.0 - 6.0 % 9.3(A) - - - -  Microalbumin 0.00 - 1.89 mg/dL - - - - 0.50  Micro/Creat Ratio 0.0 - 30.0 mg/g - - - - 7.0  Chol 0 - 200 mg/dL - - - 103 -  HDL >39 mg/dL - - - 40 -  Calc LDL - - - 53 42 -  Triglycerides <150 mg/dL - - - 103 -  Creatinine .5 - 1.1 mg/dL 1.4(A) 1.27(H) - - -   BP/Weight 11/11/2014 08/26/2014 08/15/2014 08/08/2014 08/07/2014 08/04/2014 8/84/1660  Systolic BP 630 - 160 109 323 557 -  Diastolic BP 80 - 85 78 82 57 -  Wt. (Lbs) 168 167.6 169 169 168 167 167  BMI 31.23 31.16 30.9 30.9 31.76 31.05 31.57   Foot/eye exam completion dates 12/12/2013 10/23/2012  Foot exam Order - -  Foot Form Completion Done Done

## 2014-11-11 NOTE — Assessment & Plan Note (Signed)
Updated lab needed at/ before next visit. Hyperlipidemia:Low fat diet discussed and encouraged.   Lipid Panel  Lab Results  Component Value Date   CHOL 103 01/08/2014   HDL 40 01/08/2014   LDLCALC 53 06/07/2014   TRIG 103 01/08/2014   CHOLHDL 2.6 01/08/2014

## 2014-11-11 NOTE — Patient Instructions (Addendum)
Nurse BP check next Wednesday morning, call if you need me before   BP is high, increase the valsartan/ diovan to TWO 160 mg tabs every day at the same time, new script is sent for 320 mg ONE daily  Continue all other meds as before  Hepatitis C screen, hIV , fasting lipid, cmp and EGFR, vit D when next lab for Dr Nida  You will be referred to Dr Tuchman and Dr Shapiro  Keep f/u apppt with me  Clonidine and HCTZ at 7 am  Losartan at 7 pm  Thanks for choosing [facility], we consider it a privelige to serve you.  

## 2014-11-15 ENCOUNTER — Other Ambulatory Visit: Payer: Self-pay | Admitting: Cardiology

## 2014-11-18 DIAGNOSIS — D1800 Hemangioma unspecified site: Secondary | ICD-10-CM | POA: Diagnosis not present

## 2014-11-18 DIAGNOSIS — D447 Neoplasm of uncertain behavior of aortic body and other paraganglia: Secondary | ICD-10-CM | POA: Diagnosis not present

## 2014-11-18 DIAGNOSIS — H905 Unspecified sensorineural hearing loss: Secondary | ICD-10-CM | POA: Diagnosis not present

## 2014-11-19 DIAGNOSIS — H5213 Myopia, bilateral: Secondary | ICD-10-CM | POA: Diagnosis not present

## 2014-11-19 DIAGNOSIS — H5210 Myopia, unspecified eye: Secondary | ICD-10-CM | POA: Diagnosis not present

## 2014-11-19 LAB — HM DIABETES EYE EXAM

## 2014-11-20 ENCOUNTER — Ambulatory Visit: Payer: Commercial Managed Care - HMO

## 2014-11-20 VITALS — BP 130/82 | Wt 167.0 lb

## 2014-11-20 DIAGNOSIS — I10 Essential (primary) hypertension: Secondary | ICD-10-CM

## 2014-11-20 NOTE — Progress Notes (Signed)
BP in acceptable range. Advised to continue current meds and to keep follow up appt

## 2014-11-22 ENCOUNTER — Telehealth: Payer: Self-pay | Admitting: *Deleted

## 2014-11-22 NOTE — Telephone Encounter (Signed)
Pt called requesting the nurse to call her back about a paper, pt wants to know if it has been completed.

## 2014-11-22 NOTE — Telephone Encounter (Signed)
Patient is aware that she can collect letter on 8/22

## 2014-11-25 DIAGNOSIS — E038 Other specified hypothyroidism: Secondary | ICD-10-CM | POA: Diagnosis not present

## 2014-11-25 DIAGNOSIS — E559 Vitamin D deficiency, unspecified: Secondary | ICD-10-CM | POA: Diagnosis not present

## 2014-11-25 DIAGNOSIS — E109 Type 1 diabetes mellitus without complications: Secondary | ICD-10-CM | POA: Diagnosis not present

## 2014-11-25 DIAGNOSIS — E785 Hyperlipidemia, unspecified: Secondary | ICD-10-CM | POA: Diagnosis not present

## 2014-11-25 DIAGNOSIS — Z1159 Encounter for screening for other viral diseases: Secondary | ICD-10-CM | POA: Diagnosis not present

## 2014-11-25 DIAGNOSIS — I10 Essential (primary) hypertension: Secondary | ICD-10-CM | POA: Diagnosis not present

## 2014-11-25 DIAGNOSIS — E782 Mixed hyperlipidemia: Secondary | ICD-10-CM | POA: Diagnosis not present

## 2014-11-25 DIAGNOSIS — E1065 Type 1 diabetes mellitus with hyperglycemia: Secondary | ICD-10-CM | POA: Diagnosis not present

## 2014-11-25 DIAGNOSIS — E1122 Type 2 diabetes mellitus with diabetic chronic kidney disease: Secondary | ICD-10-CM | POA: Diagnosis not present

## 2014-11-26 LAB — COMPLETE METABOLIC PANEL WITH GFR
ALT: 10 U/L (ref 6–29)
AST: 13 U/L (ref 10–35)
Albumin: 4 g/dL (ref 3.6–5.1)
Alkaline Phosphatase: 63 U/L (ref 33–130)
BUN: 15 mg/dL (ref 7–25)
CHLORIDE: 103 mmol/L (ref 98–110)
CO2: 31 mmol/L (ref 20–31)
CREATININE: 1.3 mg/dL — AB (ref 0.50–0.99)
Calcium: 9.2 mg/dL (ref 8.6–10.4)
GFR, EST AFRICAN AMERICAN: 50 mL/min — AB (ref >=60)
GFR, Est Non African American: 44 mL/min — ABNORMAL LOW (ref >=60)
Glucose, Bld: 155 mg/dL — ABNORMAL HIGH (ref 65–99)
Potassium: 3.9 mmol/L (ref 3.5–5.3)
Sodium: 140 mmol/L (ref 135–146)
Total Bilirubin: 0.3 mg/dL (ref 0.2–1.2)
Total Protein: 7.5 g/dL (ref 6.1–8.1)

## 2014-11-26 LAB — LIPID PANEL
Cholesterol: 110 mg/dL — ABNORMAL LOW (ref 125–200)
HDL: 38 mg/dL — ABNORMAL LOW (ref >=46)
LDL CALC: 51 mg/dL (ref <130)
Total CHOL/HDL Ratio: 2.9 Ratio (ref <=5.0)
Triglycerides: 107 mg/dL (ref <150)
VLDL: 21 mg/dL (ref <30)

## 2014-11-26 LAB — HEPATITIS C ANTIBODY: HCV Ab: NEGATIVE

## 2014-11-26 LAB — HIV ANTIBODY (ROUTINE TESTING W REFLEX): HIV 1&2 Ab, 4th Generation: NONREACTIVE

## 2014-11-26 LAB — VITAMIN D 25 HYDROXY (VIT D DEFICIENCY, FRACTURES): Vit D, 25-Hydroxy: 21 ng/mL — ABNORMAL LOW (ref 30–100)

## 2014-11-27 ENCOUNTER — Other Ambulatory Visit: Payer: Self-pay

## 2014-11-27 ENCOUNTER — Ambulatory Visit: Payer: Commercial Managed Care - HMO | Admitting: Nutrition

## 2014-11-27 MED ORDER — VITAMIN D (ERGOCALCIFEROL) 1.25 MG (50000 UNIT) PO CAPS: 50000.0000 [IU] | ORAL_CAPSULE | ORAL | Status: DC

## 2014-12-02 ENCOUNTER — Other Ambulatory Visit: Payer: Self-pay

## 2014-12-02 ENCOUNTER — Telehealth: Payer: Self-pay | Admitting: *Deleted

## 2014-12-02 ENCOUNTER — Other Ambulatory Visit: Payer: Self-pay | Admitting: Family Medicine

## 2014-12-02 DIAGNOSIS — E039 Hypothyroidism, unspecified: Secondary | ICD-10-CM | POA: Diagnosis not present

## 2014-12-02 DIAGNOSIS — E1122 Type 2 diabetes mellitus with diabetic chronic kidney disease: Secondary | ICD-10-CM | POA: Diagnosis not present

## 2014-12-02 DIAGNOSIS — E785 Hyperlipidemia, unspecified: Secondary | ICD-10-CM | POA: Diagnosis not present

## 2014-12-02 DIAGNOSIS — I10 Essential (primary) hypertension: Secondary | ICD-10-CM | POA: Diagnosis not present

## 2014-12-02 MED ORDER — VITAMIN D (ERGOCALCIFEROL) 1.25 MG (50000 UNIT) PO CAPS: 50000.0000 [IU] | ORAL_CAPSULE | ORAL | Status: DC

## 2014-12-02 NOTE — Telephone Encounter (Signed)
Pt called stating her A1C is.7 and pt said she needs to know how long to take the vitamin D. Please advise

## 2014-12-02 NOTE — Telephone Encounter (Signed)
Spoke with patient and she wanted to know the legnth of treatment for Vit D.  Patient notified and rx sent to [facility] per her request.

## 2014-12-10 ENCOUNTER — Ambulatory Visit: Payer: Commercial Managed Care - HMO | Admitting: Podiatry

## 2014-12-11 ENCOUNTER — Ambulatory Visit: Payer: Commercial Managed Care - HMO | Admitting: Cardiology

## 2014-12-25 ENCOUNTER — Ambulatory Visit (INDEPENDENT_AMBULATORY_CARE_PROVIDER_SITE_OTHER): Payer: Commercial Managed Care - HMO | Admitting: Podiatry

## 2014-12-25 ENCOUNTER — Encounter: Payer: Self-pay | Admitting: Podiatry

## 2014-12-25 DIAGNOSIS — B351 Tinea unguium: Secondary | ICD-10-CM | POA: Diagnosis not present

## 2014-12-25 DIAGNOSIS — M79676 Pain in unspecified toe(s): Secondary | ICD-10-CM | POA: Diagnosis not present

## 2014-12-25 NOTE — Patient Instructions (Signed)
Diabetes and Foot Care Diabetes may cause you to have problems because of poor blood supply (circulation) to your feet and legs. This may cause the skin on your feet to become thinner, break easier, and heal more slowly. Your skin may become dry, and the skin may peel and crack. You may also have nerve damage in your legs and feet causing decreased feeling in them. You may not notice minor injuries to your feet that could lead to infections or more serious problems. Taking care of your feet is one of the most important things you can do for yourself.  HOME CARE INSTRUCTIONS  Wear shoes at all times, even in the house. Do not go barefoot. Bare feet are easily injured.  Check your feet daily for blisters, cuts, and redness. If you cannot see the bottom of your feet, use a mirror or ask someone for help.  Wash your feet with warm water (do not use hot water) and mild soap. Then pat your feet and the areas between your toes until they are completely dry. Do not soak your feet as this can dry your skin.  Apply a moisturizing lotion or petroleum jelly (that does not contain alcohol and is unscented) to the skin on your feet and to dry, brittle toenails. Do not apply lotion between your toes.  Trim your toenails straight across. Do not dig under them or around the cuticle. File the edges of your nails with an emery board or nail file.  Do not cut corns or calluses or try to remove them with medicine.  Wear clean socks or stockings every day. Make sure they are not too tight. Do not wear knee-high stockings since they may decrease blood flow to your legs.  Wear shoes that fit properly and have enough cushioning. To break in new shoes, wear them for just a few hours a day. This prevents you from injuring your feet. Always look in your shoes before you put them on to be sure there are no objects inside.  Do not cross your legs. This may decrease the blood flow to your feet.  If you find a minor scrape,  cut, or break in the skin on your feet, keep it and the skin around it clean and dry. These areas may be cleansed with mild soap and water. Do not cleanse the area with peroxide, alcohol, or iodine.  When you remove an adhesive bandage, be sure not to damage the skin around it.  If you have a wound, look at it several times a day to make sure it is healing.  Do not use heating pads or hot water bottles. They may burn your skin. If you have lost feeling in your feet or legs, you may not know it is happening until it is too late.  Make sure your health care provider performs a complete foot exam at least annually or more often if you have foot problems. Report any cuts, sores, or bruises to your health care provider immediately. SEEK MEDICAL CARE IF:   You have an injury that is not healing.  You have cuts or breaks in the skin.  You have an ingrown nail.  You notice redness on your legs or feet.  You feel burning or tingling in your legs or feet.  You have pain or cramps in your legs and feet.  Your legs or feet are numb.  Your feet always feel cold. SEEK IMMEDIATE MEDICAL CARE IF:   There is increasing redness,   swelling, or pain in or around a wound.  There is a red line that goes up your leg.  Pus is coming from a wound.  You develop a fever or as directed by your health care provider.  You notice a bad smell coming from an ulcer or wound. Document Released: 03/19/2000 Document Revised: 11/22/2012 Document Reviewed: 08/29/2012 ExitCare Patient Information 2015 [employer]. This information is not intended to replace advice given to you by your health care provider. Make sure you discuss any questions you have with your health care provider.  

## 2014-12-26 NOTE — Progress Notes (Signed)
Patient ID: Christine Dominguez, female   DOB: 1950/07/17, 64 y.o.   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

## 2014-12-27 ENCOUNTER — Other Ambulatory Visit: Payer: Commercial Managed Care - HMO | Admitting: Adult Health

## 2014-12-31 ENCOUNTER — Encounter: Payer: Self-pay | Admitting: Cardiology

## 2014-12-31 ENCOUNTER — Telehealth: Payer: Self-pay | Admitting: *Deleted

## 2014-12-31 ENCOUNTER — Ambulatory Visit (INDEPENDENT_AMBULATORY_CARE_PROVIDER_SITE_OTHER): Payer: Commercial Managed Care - HMO | Admitting: Cardiology

## 2014-12-31 VITALS — BP 138/82 | HR 86 | Ht 61.5 in | Wt 167.2 lb

## 2014-12-31 DIAGNOSIS — I10 Essential (primary) hypertension: Secondary | ICD-10-CM | POA: Diagnosis not present

## 2014-12-31 DIAGNOSIS — E785 Hyperlipidemia, unspecified: Secondary | ICD-10-CM

## 2014-12-31 NOTE — Progress Notes (Signed)
Cardiology Office Note  Date: 12/31/2014   ID: [name], DOB Aug 22, 1950, MRN [id number]  PCP: Tula Nakayama, MD  Primary Cardiologist: [name], MD   Chief Complaint  Patient presents with  . Hypertension    History of Present Illness: Christine Dominguez is a 64 y.o. female last seen in May by [name] Public PA-C. She presents today for a follow-up visit, doing well overall. Antihypertensives have been adjusted, we reviewed her medications below. Blood pressure control today looks reasonable.  She reports no chest pain or breathlessness. Follow-up continues with Dr. Moshe Cipro. She continues on Zocor for management of lipid status, recent LDL 51.   Past Medical History  Diagnosis Date  . Chronic back pain 2007    Disabled   . Obesity   . Hydrocephalus     Treated with VP shunt and then intracranial tumor resection (?Cholesteatoma); no longer followed actively by neurosurgery or otolaryngology  . Type 2 diabetes mellitus 1991  . Essential hypertension, benign 1980  . GERD (gastroesophageal reflux disease) 2000  . Hypothyroidism 2013  . Hyperlipidemia 2013  . Arthritis     Cervical spine  . Neuropathy   . Anemia   . Cancer 1998, recurred in 2012    Paraganglioma of jugular tympamicum, recurrent in 2012    Current Outpatient Prescriptions  Medication Sig Dispense Refill  . aspirin EC 81 MG tablet Take 81 mg by mouth at bedtime.    . Camphor-Eucalyptus-Menthol (MEDICATED CHEST RUB) 4.73-1.2-2.6 % OINT     . cloNIDine (CATAPRES) 0.1 MG tablet Take 1 tablet (0.1 mg total) by mouth daily. 180 tablet 1  . docusate sodium (COLACE) 100 MG capsule Take 100 mg by mouth every other day.     . gabapentin (NEURONTIN) 100 MG capsule TAKE 1 CAPSULE TWICE DAILY 180 capsule 1  . glucose (SUNMARK GLUCOSE) 4 GM chewable tablet Chew by mouth.    . hydrochlorothiazide (HYDRODIURIL) 12.5 MG tablet TAKE 1 TABLET EVERY DAY 90 tablet 3  . HYDROcodone-acetaminophen  (NORCO/VICODIN) 5-325 MG per tablet Take 1 tablet by mouth every 6 (six) hours as needed for moderate pain.    Marland Kitchen ibuprofen (ADVIL,MOTRIN) 800 MG tablet Take 800 mg by mouth every 6 (six) hours.    . insulin glargine (LANTUS SOLOSTAR) 100 UNIT/ML injection Inject 25 Units into the skin at bedtime.     . Iron 66 MG TABS Take 1 tablet by mouth 2 (two) times daily.     Marland Kitchen levothyroxine (SYNTHROID, LEVOTHROID) 50 MCG tablet Take 50 mcg by mouth daily before breakfast.    . linagliptin (TRADJENTA) 5 MG TABS tablet Take 5 mg by mouth daily.    Marland Kitchen nystatin (MYCOSTATIN) powder APPLY TOPICALLY AT BEDTIME TO RASH ON LOWER ABDOMEN AS NEEDED 120 g 1  . omeprazole (PRILOSEC) 40 MG capsule TAKE 1 CAPSULE ONE TIME DAILY 90 capsule 1  . ranitidine (ZANTAC) 150 MG tablet Take 150 mg by mouth daily.    . Simethicone (GAS RELIEF DROPS PO) Take by mouth as needed.    . simvastatin (ZOCOR) 20 MG tablet Take 20 mg by mouth at bedtime.      . sodium chloride (OCEAN) 0.65 % nasal spray     . tiZANidine (ZANAFLEX) 2 MG tablet Take 1 tablet (2 mg total) by mouth daily as needed for muscle spasms. 30 tablet 0  . valsartan (DIOVAN) 320 MG tablet Take 1 tablet (320 mg total) by mouth daily. 90 tablet 1  . Vitamin D,  Ergocalciferol, (DRISDOL) 50000 UNITS CAPS capsule Take 1 capsule (50,000 Units total) by mouth every 7 (seven) days. 12 capsule 1   No current facility-administered medications for this visit.    Allergies:  Amaryl; Aspirin; Avandia; Glimepiride; Glipizide; Invokana; and Novolog   Social History: The patient  reports that she quit smoking about 13 years ago. Her smoking use included Cigarettes. She has a 7.5 pack-year smoking history. She has never used smokeless tobacco. She reports that she does not drink alcohol or use illicit drugs.   ROS:  Please see the history of present illness. Otherwise, complete review of systems is positive for recent dental extractions and dentures.  All other systems are reviewed  and negative.   Physical Exam: VS:  BP 138/82 mmHg  Pulse 86  Ht 5' 1.5" (1.562 m)  Wt 167 lb 3.2 oz (75.841 kg)  BMI 31.08 kg/m2  SpO2 98%, BMI Body mass index is 31.08 kg/(m^2).  Wt Readings from Last 3 Encounters:  12/31/14 167 lb 3.2 oz (75.841 kg)  11/20/14 167 lb (75.751 kg)  11/11/14 168 lb (76.204 kg)     Patient appears comfortable at rest.  HEENT: Conjunctiva normal, oropharynx clear with moist mucosa.  Neck: Supple, no carotid bruits, VP shunt on left side, no thyromegaly.  Lungs: Clear to auscultation, nonlabored breathing at rest.  Cardiac: Regular rate and rhythm, no S3 soft, systolic murmur, no pericardial rub.  Abdomen: Soft, nontender, bowel sounds present, no guarding or rebound.  Extremities: No pitting edema, distal pulses 2+.    ECG: Tracing from 08/05/2014 showed sinus rhythm with poor R wave progression.  Recent Labwork: 08/04/2014: Hemoglobin 10.8*; Platelets 228 09/09/2014: TSH 0.90 11/25/2014: ALT 10; AST 13; BUN 15; Creat 1.30*; Potassium 3.9; Sodium 140     Component Value Date/Time   CHOL 110* 11/25/2014 0706   TRIG 107 11/25/2014 0706   HDL 38* 11/25/2014 0706   CHOLHDL 2.9 11/25/2014 0706   VLDL 21 11/25/2014 0706   LDLCALC 51 11/25/2014 0706    ASSESSMENT AND PLAN:  1. Essential hypertension, no changes made to current regimen.  2. Hyperlipidemia, on Zocor with recent LDL 51.  Current medicines were reviewed at length with the patient today.  Disposition: FU with me in 6 months.   Signed, Satira Sark, MD, [facility] 12/31/2014 3:00 PM    [facility]. [address], [place], [place] [address] Phone: [phone]; Fax: [phone]

## 2014-12-31 NOTE — Patient Instructions (Signed)

## 2014-12-31 NOTE — Telephone Encounter (Signed)
Pt called lmom requesting something for muscle spasms

## 2015-01-01 MED ORDER — TIZANIDINE HCL 2 MG PO TABS: 2.0000 mg | ORAL_TABLET | Freq: Every day | ORAL | Status: DC | PRN

## 2015-01-01 NOTE — Telephone Encounter (Signed)
Med refilled.

## 2015-01-03 ENCOUNTER — Encounter: Payer: Self-pay | Admitting: Adult Health

## 2015-01-03 ENCOUNTER — Ambulatory Visit (INDEPENDENT_AMBULATORY_CARE_PROVIDER_SITE_OTHER): Payer: Commercial Managed Care - HMO | Admitting: Adult Health

## 2015-01-03 VITALS — BP 160/88 | HR 96 | Ht 61.25 in | Wt 168.0 lb

## 2015-01-03 DIAGNOSIS — Z1211 Encounter for screening for malignant neoplasm of colon: Secondary | ICD-10-CM

## 2015-01-03 DIAGNOSIS — Z01419 Encounter for gynecological examination (general) (routine) without abnormal findings: Secondary | ICD-10-CM

## 2015-01-03 LAB — HEMOCCULT GUIAC POC 1CARD (OFFICE): Fecal Occult Blood, POC: NEGATIVE

## 2015-01-03 NOTE — Patient Instructions (Signed)
Pap and physical in 1 year  

## 2015-01-03 NOTE — Progress Notes (Signed)
Patient ID: ISYS TIETJE, female   DOB: [birth year], 64 y.o.   MRN: [id number] History of Present Illness: Christine Dominguez is a 64 year old black female,widowed in for a well woman gyn exam.She had a normal pap with negative HPV 01/02/13. PCP is Dr Moshe Cipro, and sees Dr Dorris Fetch.  Current Medications, Allergies, Past Medical History, Past Surgical History, Family History and Social History were reviewed in [employer] record.     Review of Systems: Patient denies any headaches, hearing loss, fatigue, blurred vision, shortness of breath, chest pain, abdominal pain, problems with bowel movements, urination, or intercourse(not having sex). No joint pain or mood swings.Has chronic back pain(arthritis) and had had teeth pulled, has top dentures, but has not gotten the bottom ones yet. Has MRI 10/31 on head to monitor tumor.   Physical Exam:BP 160/88 mmHg  Pulse 96  Ht 5' 1.25" (1.556 m)  Wt 168 lb (76.204 kg)  BMI 31.47 kg/m2 General:  Well developed, well nourished, no acute distress Skin:  Warm and dry Neck:  Midline trachea, normal thyroid, good ROM, no lymphadenopathy, no carotid bruits heard  Lungs; Clear to auscultation bilaterally Breast:  No dominant palpable mass, retraction, or nipple discharge Cardiovascular: Regular rate and rhythm Abdomen:  Soft, non tender, no hepatosplenomegaly Pelvic:  External genitalia is normal in appearance, no lesions.  The vagina is atrophic in appearance. Urethra has no lesions or masses. The cervix is smooth.  Uterus is felt to be normal size, shape, and contour.  No adnexal masses or tenderness noted.Bladder is non tender, no masses felt. Rectal: Good sphincter tone, no polyps, or hemorrhoids felt.  Hemoccult negative. Extremities/musculoskeletal:  No swelling or varicosities noted, no clubbing or cyanosis Psych:  No mood changes, alert and cooperative,seems happy   Impression: Well woman gyn exam no pap    Plan: Pap and  physical in 1 year Mammogram yearly Labs with PCP Colonoscopy per GI

## 2015-01-06 ENCOUNTER — Other Ambulatory Visit: Payer: Self-pay | Admitting: Cardiology

## 2015-01-15 ENCOUNTER — Other Ambulatory Visit: Payer: Self-pay | Admitting: Family Medicine

## 2015-01-21 ENCOUNTER — Ambulatory Visit: Payer: Commercial Managed Care - HMO | Admitting: Family Medicine

## 2015-01-21 ENCOUNTER — Other Ambulatory Visit: Payer: Self-pay | Admitting: "Endocrinology

## 2015-01-21 ENCOUNTER — Ambulatory Visit: Payer: Commercial Managed Care - HMO

## 2015-01-21 VITALS — BP 160/90

## 2015-01-21 DIAGNOSIS — I10 Essential (primary) hypertension: Secondary | ICD-10-CM

## 2015-01-21 MED ORDER — CLONAZEPAM 0.5 MG PO TABS: 0.2500 mg | ORAL_TABLET | Freq: Two times a day (BID) | ORAL | Status: DC | PRN

## 2015-01-21 NOTE — Progress Notes (Signed)
Patient in for nurse visit blood pressure check.   Was at dentist this am and blood pressure was too elevated to have procedure done.   Blood pressure elevated here in office.   Patient states that when she checks at home she is receiving normal readings.  Is taking all blood pressure medications.  Will consult with MD for next steps.   Consulted with MD.  Patient is taking medications as prescribed with normal readings at cardiology and at home.  No changes to blood pressure medication.  Patient will come back prior to next dentist appointment and have nurse blood pressure check.   Klonopin 0.25 mg BID PRN also started.  Patient aware and will have brother to drive her to appointment when she takes this.

## 2015-02-15 DIAGNOSIS — D447 Neoplasm of uncertain behavior of aortic body and other paraganglia: Secondary | ICD-10-CM | POA: Diagnosis not present

## 2015-02-15 DIAGNOSIS — D1800 Hemangioma unspecified site: Secondary | ICD-10-CM | POA: Diagnosis not present

## 2015-02-15 DIAGNOSIS — Z982 Presence of cerebrospinal fluid drainage device: Secondary | ICD-10-CM | POA: Diagnosis not present

## 2015-02-18 ENCOUNTER — Other Ambulatory Visit: Payer: Self-pay | Admitting: Family Medicine

## 2015-02-25 DIAGNOSIS — I10 Essential (primary) hypertension: Secondary | ICD-10-CM | POA: Diagnosis not present

## 2015-02-25 DIAGNOSIS — E782 Mixed hyperlipidemia: Secondary | ICD-10-CM | POA: Diagnosis not present

## 2015-02-25 DIAGNOSIS — E038 Other specified hypothyroidism: Secondary | ICD-10-CM | POA: Diagnosis not present

## 2015-02-25 DIAGNOSIS — E1122 Type 2 diabetes mellitus with diabetic chronic kidney disease: Secondary | ICD-10-CM | POA: Diagnosis not present

## 2015-02-25 LAB — HEMOGLOBIN A1C: HEMOGLOBIN A1C: 8.7 % — AB (ref 4.0–6.0)

## 2015-03-06 ENCOUNTER — Encounter: Payer: Self-pay | Admitting: "Endocrinology

## 2015-03-06 ENCOUNTER — Ambulatory Visit (INDEPENDENT_AMBULATORY_CARE_PROVIDER_SITE_OTHER): Payer: Commercial Managed Care - HMO | Admitting: "Endocrinology

## 2015-03-06 VITALS — BP 159/87 | HR 87 | Ht 61.5 in | Wt 174.0 lb

## 2015-03-06 DIAGNOSIS — I10 Essential (primary) hypertension: Secondary | ICD-10-CM | POA: Diagnosis not present

## 2015-03-06 DIAGNOSIS — E785 Hyperlipidemia, unspecified: Secondary | ICD-10-CM

## 2015-03-06 DIAGNOSIS — N183 Chronic kidney disease, stage 3 unspecified: Secondary | ICD-10-CM

## 2015-03-06 DIAGNOSIS — E1122 Type 2 diabetes mellitus with diabetic chronic kidney disease: Secondary | ICD-10-CM

## 2015-03-06 DIAGNOSIS — E039 Hypothyroidism, unspecified: Secondary | ICD-10-CM

## 2015-03-06 MED ORDER — LEVOTHYROXINE SODIUM 75 MCG PO TABS: 75.0000 ug | ORAL_TABLET | Freq: Every day | ORAL | Status: DC

## 2015-03-06 NOTE — Patient Instructions (Signed)

## 2015-03-06 NOTE — Progress Notes (Signed)
Subjective:    Patient ID: Christine Dominguez, female    DOB: 11/27/1950, PCP Tula Nakayama, MD   Past Medical History  Diagnosis Date  . Chronic back pain 2007    Disabled   . Obesity   . Hydrocephalus     Treated with VP shunt and then intracranial tumor resection (?Cholesteatoma); no longer followed actively by neurosurgery or otolaryngology  . Type 2 diabetes mellitus ([place]) 1991  . Essential hypertension, benign 1980  . GERD (gastroesophageal reflux disease) 2000  . Hypothyroidism 2013  . Hyperlipidemia 2013  . Arthritis     Cervical spine  . Neuropathy ([place])   . Anemia   . Cancer ([place]) 1998, recurred in 2012    Paraganglioma of jugular tympamicum, recurrent in 2012   Past Surgical History  Procedure Laterality Date  . Ventriculoperitoneal shunt  1998    At [facility]  . Intracranial mass resected  1998    At [facility]; ? location-right middle ear  . Hemorrhoidectomy    . Trigger finger release      Rght thumb  . Tubal ligation  1980  . Colonoscopy  06/2010  . Tubal ligation    . Cataract extraction w/phaco Right 07/17/2013    Procedure: CATARACT EXTRACTION PHACO AND INTRAOCULAR LENS PLACEMENT (IOC);  Surgeon: [name], MD;  Location: [facility];  Service: Ophthalmology;  Laterality: Right;  CDE:7.24  . Teeth pulled     Social History   Social History  . Marital Status: Widowed    Spouse Name: N/A  . Number of Children: 3  . Years of Education: N/A   Occupational History  . disabled     Social History Main Topics  . Smoking status: Former Smoker -- 0.50 packs/day for 15 years    Types: Cigarettes    Quit date: 07/07/2001  . Smokeless tobacco: Never Used  . Alcohol Use: No  . Drug Use: No  . Sexual Activity: No     Comment: tubal   Other Topics Concern  . None   Social History Narrative   Mother of 3 - all children are deceased         Outpatient Encounter Prescriptions as of 03/06/2015  Medication Sig  . aspirin EC 81 MG tablet Take 81 mg by  mouth at bedtime.  . clonazePAM (KLONOPIN) 0.5 MG tablet Take 0.5 tablets (0.25 mg total) by mouth 2 (two) times daily as needed for anxiety.  . cloNIDine (CATAPRES) 0.1 MG tablet TAKE 1 TABLET EVERY DAY  . gabapentin (NEURONTIN) 100 MG capsule TAKE 1 CAPSULE TWICE DAILY  . glucose (SUNMARK GLUCOSE) 4 GM chewable tablet Chew by mouth as needed.   . hydrochlorothiazide (HYDRODIURIL) 12.5 MG tablet TAKE 1 TABLET EVERY DAY  . insulin glargine (LANTUS SOLOSTAR) 100 UNIT/ML injection Inject 35 Units into the skin at bedtime.  . Iron 66 MG TABS Take 1 tablet by mouth 2 (two) times daily.   Marland Kitchen levothyroxine (SYNTHROID, LEVOTHROID) 75 MCG tablet Take 1 tablet (75 mcg total) by mouth daily before breakfast.  . linagliptin (TRADJENTA) 5 MG TABS tablet Take 5 mg by mouth daily.  Marland Kitchen nystatin (MYCOSTATIN) powder APPLY TOPICALLY AT BEDTIME TO RASH ON LOWER ABDOMEN AS NEEDED  . omeprazole (PRILOSEC) 40 MG capsule TAKE 1 CAPSULE ONE TIME DAILY  . Simethicone (GAS RELIEF DROPS PO) Take by mouth as needed.  . simvastatin (ZOCOR) 20 MG tablet Take 20 mg by mouth at bedtime.    Marland Kitchen tiZANidine (ZANAFLEX) 2  MG tablet Take 1 tablet (2 mg total) by mouth daily as needed for muscle spasms.  . valsartan (DIOVAN) 320 MG tablet Take 1 tablet (320 mg total) by mouth daily.  . Vitamin D, Ergocalciferol, (DRISDOL) 50000 UNITS CAPS capsule Take 1 capsule (50,000 Units total) by mouth every 7 (seven) days.  . [DISCONTINUED] LANTUS SOLOSTAR 100 UNIT/ML Solostar Pen INJECT  30 UNITS SUBCUTANEOUSLY AT BEDTIME  . [DISCONTINUED] levothyroxine (SYNTHROID, LEVOTHROID) 50 MCG tablet Take 50 mcg by mouth daily before breakfast.  . sodium chloride (OCEAN) 0.65 % nasal spray 1 spray as needed.    No facility-administered encounter medications on file as of 03/06/2015.   ALLERGIES: Allergies  Allergen Reactions  . Amaryl   . Avandia [Rosiglitazone Maleate]   . Glimepiride Itching  . Glipizide Itching  . Invokana [Canagliflozin]   .  Novolog [Insulin Aspart]   . Rosiglitazone     Other reaction(s): OTHER  . Sitagliptin     Other reaction(s): OTHER  . Aspirin Nausea Only and Other (See Comments)    Other Reaction: GI Upset More of a burning feeling when she take it Burning in stomach, nausea   VACCINATION STATUS: Immunization History  Administered Date(s) Administered  . Influenza Split 02/15/2012  . Influenza Whole 01/06/2010  . Influenza,inj,Quad PF,36+ Mos 05/14/2013, 01/17/2014  . Pneumococcal Conjugate-13 08/08/2014  . Pneumococcal Polysaccharide-23 10/13/2009  . Tdap 08/24/2005    HPI  64- yr - old female with medical problem of type 2 DM, HTN, HPL. She also has hx of CKD stage 3 which is stable. She is here for f/u of her type 2 DM. She denies hypoglycemia, but she worries about it . A1c is same at 8.7%, generally improved from from 9.3%. She is compliant to her Lt4 and She is on Diovan for HTN. she remains on Tradjenta, along with Lantus .    Review of Systems  Constitutional: Positive for fatigue. Negative for unexpected weight change.  HENT: Negative for trouble swallowing and voice change.   Eyes: Negative for visual disturbance.  Respiratory: Negative for cough, shortness of breath and wheezing.   Cardiovascular: Negative for chest pain, palpitations and leg swelling.  Gastrointestinal: Negative for nausea, vomiting and diarrhea.  Endocrine: Negative for cold intolerance, heat intolerance, polydipsia, polyphagia and polyuria.  Musculoskeletal: Negative for myalgias and arthralgias.  Skin: Negative for color change, pallor, rash and wound.  Neurological: Negative for seizures and headaches.  Psychiatric/Behavioral: Negative for suicidal ideas and confusion.    Objective:    BP 159/87 mmHg  Pulse 87  Ht 5' 1.5" (1.562 m)  Wt 174 lb (78.926 kg)  BMI 32.35 kg/m2  SpO2 99%  Wt Readings from Last 3 Encounters:  03/06/15 174 lb (78.926 kg)  01/03/15 168 lb (76.204 kg)  12/31/14 167 lb 3.2  oz (75.841 kg)    Physical Exam  Constitutional: She is oriented to person, place, and time. She appears well-developed.  HENT:  Head: Normocephalic and atraumatic.  Eyes: EOM are normal.  Neck: Normal range of motion. Neck supple. No tracheal deviation present. No thyromegaly present.  Cardiovascular: Normal rate and regular rhythm.   Pulmonary/Chest: Effort normal and breath sounds normal.  Abdominal: Soft. Bowel sounds are normal. There is no tenderness. There is no guarding.  Musculoskeletal: Normal range of motion. She exhibits no edema.  Neurological: She is alert and oriented to person, place, and time. She has normal reflexes. No cranial nerve deficit. Coordination normal.  Skin: Skin is warm and dry. No  rash noted. No erythema. No pallor.  Psychiatric: She has a normal mood and affect. Judgment normal.    Results for orders placed or performed in visit on 03/06/15  Hemoglobin A1c  Result Value Ref Range   Hgb A1c MFr Bld 8.7 (A) 4.0 - 6.0 %   Complete Blood Count (Most recent): Lab Results  Component Value Date   WBC 7.9 08/04/2014   HGB 10.8* 08/04/2014   HCT 33.8* 08/04/2014   MCV 84.9 08/04/2014   PLT 228 08/04/2014   Chemistry (most recent): Lab Results  Component Value Date   NA 140 11/25/2014   K 3.9 11/25/2014   CL 103 11/25/2014   CO2 31 11/25/2014   BUN 15 11/25/2014   CREATININE 1.30* 11/25/2014   Diabetic Labs (most recent): Lab Results  Component Value Date   HGBA1C 8.7* 02/25/2015   HGBA1C 9.3* 09/09/2014   HGBA1C 8.4* 07/12/2013   Lipid profile (most recent): Lab Results  Component Value Date   TRIG 107 11/25/2014   CHOL 110* 11/25/2014    Assessment & Plan:   1. Diabetes mellitus with stage 3 chronic kidney disease ([place]) -She remains at a high risk for more acute and chronic complications of diabetes which include CAD, CVA, CKD, retinopathy, and neuropathy. These are all discussed in detail with the patient.  Patient came with  slightly above target glucose profile, and  recent A1c of 8.7% unchanged from last visit.   Glucose logs and insulin administration records pertaining to this visit,  to be scanned into patient's records.  Recent labs reviewed.   - I have re-counseled the patient on diet management andweight loss  by adopting a carbohydrate restricted / protein rich  Diet.  - Suggestion is made for patient to avoid simple carbohydrates   from their diet including Cakes , Desserts, Ice Cream,  Soda (  diet and regular) , Sweet Tea , Candies,  Chips, Cookies, Artificial Sweeteners,   and "Sugar-free" Products .  This will help patient to have stable blood glucose profile and potentially avoid unintended  Weight gain.  - Patient is advised to stick to a routine mealtimes to eat 3 meals  a day and avoid unnecessary snacks ( to snack only to correct hypoglycemia).  - The patient will be  scheduled with Jearld Fenton, RDN, CDE for individualized DM education.  - I have approached patient with the following individualized plan to manage diabetes and patient agrees.  She does not want to tighten control , fear of hypoglycemia.  -She has some room on her basal insulin, I will crease Lantus to 35 units qhs associated with blood glucose monitoring twice a day, Tradjenta 5 mg po qam. She did not like Invokana .  - Patient specific target  for A1c; LDL, HDL, Triglycerides, and  Waist Circumference were discussed in detail.  2) BP/HTN: Controlled. Continue current medications including ACEI/ARB. 3) Lipids/HPL:  continue statins. 4)  Weight/Diet: CDE consult in progress, exercise, and carbohydrates information provided. 5) hypothyroidism: She is euthyroid , however she would benefit from a slight increase in her thyroid hormone. I'll prescribed levothyroxine 75 g by mouth every morning.  - We discussed about correct intake of levothyroxine, at fasting, with water, separated by at least 30 minutes from breakfast, and  separated by more than 4 hours from calcium, iron, multivitamins, acid reflux medications (PPIs). -Patient is made aware of the fact that thyroid hormone replacement is needed for life, dose to be adjusted by periodic monitoring of  thyroid function tests.  6) Chronic Care/Health Maintenance:  -Patient is on ACEI/ARB and Statin medications and encouraged to continue to follow up with Ophthalmology, Podiatrist at least yearly or according to recommendations, and advised to  stay away from smoking. I have recommended yearly flu vaccine and pneumonia vaccination at least every 5 years; moderate intensity exercise for up to 150 minutes weekly; and  sleep for at least 7 hours a day.  - 25 minutes of time was spent on the care of this patient , 50% of which was applied for counseling on diabetes complications and their preventions.  - I advised patient to maintain close follow up with Tula Nakayama, MD for primary care needs.  Patient is asked to bring meter and  blood glucose logs during their next visit.   Follow up plan: -Return in about 3 months (around 06/04/2015) for diabetes, high blood pressure, high cholesterol, underactive thyroid, follow up with pre-visit labs, meter, and logs.  Glade Lloyd, MD Phone: [phone]  Fax: [phone]   03/06/2015, 8:44 PM

## 2015-03-10 ENCOUNTER — Other Ambulatory Visit: Payer: Self-pay | Admitting: Family Medicine

## 2015-03-14 ENCOUNTER — Other Ambulatory Visit: Payer: Self-pay | Admitting: "Endocrinology

## 2015-03-28 ENCOUNTER — Other Ambulatory Visit: Payer: Self-pay | Admitting: Family Medicine

## 2015-04-01 ENCOUNTER — Telehealth: Payer: Self-pay | Admitting: Family Medicine

## 2015-04-01 NOTE — Telephone Encounter (Signed)
Arrange work in appt this week has established disc disease please

## 2015-04-01 NOTE — Telephone Encounter (Signed)
Coming in sat

## 2015-04-01 NOTE — Telephone Encounter (Signed)
Patient is asking if her Vitamin D, Ergocalciferol, (DRISDOL) 50000 UNITS CAPS capsule is going to be filled

## 2015-04-01 NOTE — Telephone Encounter (Signed)
States she has been having a lot of lower back pain again for the past 3 weeks and its going into her left buttock and leg. Wants to know if there is anything else you recommend? Med? Or PT or referral. Please advise

## 2015-04-02 ENCOUNTER — Ambulatory Visit: Payer: Commercial Managed Care - HMO | Admitting: Podiatry

## 2015-04-03 ENCOUNTER — Ambulatory Visit (INDEPENDENT_AMBULATORY_CARE_PROVIDER_SITE_OTHER): Payer: Commercial Managed Care - HMO | Admitting: Family Medicine

## 2015-04-03 ENCOUNTER — Encounter: Payer: Self-pay | Admitting: Family Medicine

## 2015-04-03 VITALS — BP 162/80 | HR 90 | Resp 16 | Ht 61.5 in | Wt 171.0 lb

## 2015-04-03 DIAGNOSIS — E785 Hyperlipidemia, unspecified: Secondary | ICD-10-CM

## 2015-04-03 DIAGNOSIS — Z23 Encounter for immunization: Secondary | ICD-10-CM | POA: Diagnosis not present

## 2015-04-03 DIAGNOSIS — I10 Essential (primary) hypertension: Secondary | ICD-10-CM | POA: Diagnosis not present

## 2015-04-03 DIAGNOSIS — N183 Chronic kidney disease, stage 3 (moderate): Secondary | ICD-10-CM

## 2015-04-03 DIAGNOSIS — M5440 Lumbago with sciatica, unspecified side: Secondary | ICD-10-CM

## 2015-04-03 DIAGNOSIS — E1122 Type 2 diabetes mellitus with diabetic chronic kidney disease: Secondary | ICD-10-CM

## 2015-04-03 MED ORDER — KETOROLAC TROMETHAMINE 60 MG/2ML IM SOLN
30.0000 mg | Freq: Once | INTRAMUSCULAR | Status: AC
  Administered 2015-04-03: 30 mg via INTRAMUSCULAR

## 2015-04-03 MED ORDER — PREDNISONE 5 MG PO TABS: 5.0000 mg | ORAL_TABLET | Freq: Two times a day (BID) | ORAL | Status: AC

## 2015-04-03 MED ORDER — METHYLPREDNISOLONE ACETATE 80 MG/ML IJ SUSP
40.0000 mg | Freq: Once | INTRAMUSCULAR | Status: AC
  Administered 2015-04-03: 40 mg via INTRAMUSCULAR

## 2015-04-03 NOTE — Progress Notes (Signed)
Subjective:    Patient ID: Christine Dominguez, female    DOB: 1950-08-12, 64 y.o.   MRN: [id number]  HPI   Christine Dominguez     MRN: [id number]      DOB: 1950-12-09   HPI Christine Dominguez is here for follow up and re-evaluation of chronic medical conditions, medication management and review of any available recent lab and radiology data.  Preventive health is updated, specifically  Cancer screening and Immunization.   Questions or concerns regarding consultations or procedures which the PT has had in the interim are  Addressed.Blood sugar uncontrolled but working on this C/o increased low back pain radiating to both buttock since  Thanksgiving when she did a lot of cooking, also states since past 1 week she has had increased pain, after sweeping Reports am sugars around 120 to 140, but has had episodes where the fall overnight in the 90's , working with endo   ROS Denies recent fever or chills. Denies sinus pressure, nasal congestion, ear pain or sore throat. Denies chest congestion, productive cough or wheezing. Denies chest pains, palpitations and leg swelling Denies abdominal pain, nausea, vomiting,diarrhea or constipation.   Denies dysuria, frequency, hesitancy or incontineDenies joint pain, swelling and limitation in mobility. Denies headaches, seizures, numbness, or tingling. Denies depression, anxiety or insomnia. Denies skin break down or rash.   PE  BP 162/80 mmHg  Pulse 90  Resp 16  Ht 5' 1.5" (1.562 m)  Wt 171 lb (77.565 kg)  BMI 31.79 kg/m2  SpO2 97%  Patient alert and oriented and in no cardiopulmonary distress.  HEENT: No facial asymmetry, EOMI,   oropharynx pink and moist.  Neck supple no JVD, no mass.  Chest: Clear to auscultation bilaterally.  CVS: S1, S2 no murmurs, no S3.Regular rate.  ABD: Soft non tender.   Ext: No edema  MS: decreased ROM lumbar spine, adeqautein knees   Skin: Intact, no ulcerations or rash noted.  Psych: Good  eye contact, normal affect. Memory intact not anxious or depressed appearing.  CNS: CN 2-12 intact, power,  normal throughout.no focal deficits noted.   Assessment & Plan   Essential hypertension, benign Uncontrolled, pt in apin and insists that her BP is normal outside of the office . She is regulalrly followed by cardiology and endo. No change made in meds, however she is to return for nurse BP re eval, and I made her aware of continued organ damage from uncontrolled BP DASH diet and commitment to daily physical activity for a minimum of 30 minutes discussed and encouraged, as a part of hypertension management. The importance of attaining a healthy weight is also discussed.  BP/Weight 04/03/2015 03/06/2015 01/21/2015 01/03/2015 12/31/2014 [id number]  Systolic BP 0000000 [id number] 0000000 0000000 0000000 [id number]  Diastolic BP 80 87 90 88 82 82 80  Wt. (Lbs) 171 174 - 168 167.2 167 168  BMI 31.79 32.35 - 31.47 31.08 31.05 31.23        Backache Uncontrolled.Toradol and depo medrol administered IM in the office , to be followed by a short course of oral prednisone .   Hyperlipidemia Hyperlipidemia:Low fat diet discussed and encouraged.   Lipid Panel  Lab Results  Component Value Date   CHOL 110* 11/25/2014   HDL 38* 11/25/2014   LDLCALC 51 11/25/2014   TRIG 107 11/25/2014   CHOLHDL 2.9 11/25/2014   Controlled, no change in medication Updated lab needed at/ before next visit.      Diabetes mellitus with  stage 3 chronic kidney disease ([place]) Improved but still not at goal Needs to engage with management as far as diet and medication are concerned, followed by endo., She reports hypoglycemic episodes in early morning hrs, I encourage her to address further with endo Christine Dominguez is reminded of the importance of commitment to daily physical activity for 30 minutes or more, as able and the need to limit carbohydrate intake to 30 to 60 grams per meal to help with blood sugar control.    The need to take medication as prescribed, test blood sugar as directed, and to call between visits if there is a concern that blood sugar is uncontrolled is also discussed.   Christine Dominguez is reminded of the importance of daily foot exam, annual eye examination, and good blood sugar, blood pressure and cholesterol control.  Diabetic Labs Latest Ref Rng 02/25/2015 11/25/2014 09/09/2014 08/04/2014 06/07/2014  HbA1c 4.0 - 6.0 % 8.7(A) - 9.3(A) - -  Microalbumin 0.00 - 1.89 mg/dL - - - - -  Micro/Creat Ratio 0.0 - 30.0 mg/g - - - - -  Chol 125 - 200 mg/dL - 110(L) - - -  HDL >=46 mg/dL - 38(L) - - -  Calc LDL <130 mg/dL - 51 - - 53  Triglycerides <150 mg/dL - 107 - - -  Creatinine 0.50 - 0.99 mg/dL - 1.30(H) 1.4(A) 1.27(H) -   BP/Weight 04/03/2015 03/06/2015 01/21/2015 01/03/2015 12/31/2014 [id number]  Systolic BP 0000000 [id number] 0000000 0000000 0000000 [id number]  Diastolic BP 80 87 90 88 82 82 80  Wt. (Lbs) 171 174 - 168 167.2 167 168  BMI 31.79 32.35 - 31.47 31.08 31.05 31.23   Foot/eye exam completion dates Latest Ref Rng 11/19/2014 12/12/2013  Eye Exam No Retinopathy No Retinopathy -  Foot exam Order - - -  Foot Form Completion - - Done              Review of Systems     Objective:   Physical Exam        Assessment & Plan:

## 2015-04-03 NOTE — Patient Instructions (Addendum)
Annual wellness May 8 or after, call if you need me sooner  Blood pressure elevated at visit, please take all medication as prescribed at same time every day  Injections in office today, toradol and depo medrol and prednisone sent to local pharmacy  Please continue to work closely with Dr Dorris Fetch as far as your blood sugar is oncerned  Thanks for choosing [facility], we consider it a privelige to serve you.  All the best for 2017!

## 2015-04-07 NOTE — Assessment & Plan Note (Signed)
Improved but still not at goal Needs to engage with management as far as diet and medication are concerned, followed by endo., She reports hypoglycemic episodes in early morning hrs, I encourage her to address further with endo Ms. ScalesBeveridg is reminded of the importance of commitment to daily physical activity for 30 minutes or more, as able and the need to limit carbohydrate intake to 30 to 60 grams per meal to help with blood sugar control.   The need to take medication as prescribed, test blood sugar as directed, and to call between visits if there is a concern that blood sugar is uncontrolled is also discussed.   Ms. Billy Fischer is reminded of the importance of daily foot exam, annual eye examination, and good blood sugar, blood pressure and cholesterol control.  Diabetic Labs Latest Ref Rng 02/25/2015 11/25/2014 09/09/2014 08/04/2014 06/07/2014  HbA1c 4.0 - 6.0 % 8.7(A) - 9.3(A) - -  Microalbumin 0.00 - 1.89 mg/dL - - - - -  Micro/Creat Ratio 0.0 - 30.0 mg/g - - - - -  Chol 125 - 200 mg/dL - 110(L) - - -  HDL >=46 mg/dL - 38(L) - - -  Calc LDL <130 mg/dL - 51 - - 53  Triglycerides <150 mg/dL - 107 - - -  Creatinine 0.50 - 0.99 mg/dL - 1.30(H) 1.4(A) 1.27(H) -   BP/Weight 04/03/2015 03/06/2015 01/21/2015 01/03/2015 12/31/2014 [id number]  Systolic BP 0000000 Q000111Q 0000000 0000000 0000000 [id number]  Diastolic BP 80 87 90 88 82 82 80  Wt. (Lbs) 171 174 - 168 167.2 167 168  BMI 31.79 32.35 - 31.47 31.08 31.05 31.23   Foot/eye exam completion dates Latest Ref Rng 11/19/2014 12/12/2013  Eye Exam No Retinopathy No Retinopathy -  Foot exam Order - - -  Foot Form Completion - - Done

## 2015-04-07 NOTE — Assessment & Plan Note (Signed)
Uncontrolled.Toradol and depo medrol administered IM in the office , to be followed by a short course of oral prednisone   

## 2015-04-07 NOTE — Assessment & Plan Note (Signed)
Uncontrolled, pt in apin and insists that her BP is normal outside of the office . She is regulalrly followed by cardiology and endo. No change made in meds, however she is to return for nurse BP re eval, and I made her aware of continued organ damage from uncontrolled BP DASH diet and commitment to daily physical activity for a minimum of 30 minutes discussed and encouraged, as a part of hypertension management. The importance of attaining a healthy weight is also discussed.  BP/Weight 04/03/2015 03/06/2015 01/21/2015 01/03/2015 12/31/2014 [id number]  Systolic BP 0000000 Q000111Q 0000000 0000000 0000000 [id number]  Diastolic BP 80 87 90 88 82 82 80  Wt. (Lbs) 171 174 - 168 167.2 167 168  BMI 31.79 32.35 - 31.47 31.08 31.05 31.23

## 2015-04-07 NOTE — Assessment & Plan Note (Signed)
Hyperlipidemia:Low fat diet discussed and encouraged.   Lipid Panel  Lab Results  Component Value Date   CHOL 110* 11/25/2014   HDL 38* 11/25/2014   LDLCALC 51 11/25/2014   TRIG 107 11/25/2014   CHOLHDL 2.9 11/25/2014   Controlled, no change in medication Updated lab needed at/ before next visit.

## 2015-04-14 ENCOUNTER — Other Ambulatory Visit: Payer: Self-pay

## 2015-04-14 MED ORDER — GLUCOSE BLOOD VI STRP: ORAL_STRIP | Status: DC

## 2015-04-17 ENCOUNTER — Other Ambulatory Visit: Payer: Self-pay | Admitting: "Endocrinology

## 2015-04-17 ENCOUNTER — Ambulatory Visit: Payer: Commercial Managed Care - HMO | Admitting: Family Medicine

## 2015-04-17 ENCOUNTER — Other Ambulatory Visit: Payer: Self-pay | Admitting: Family Medicine

## 2015-04-21 ENCOUNTER — Telehealth: Payer: Self-pay

## 2015-04-21 DIAGNOSIS — M5442 Lumbago with sciatica, left side: Secondary | ICD-10-CM

## 2015-04-21 NOTE — Telephone Encounter (Signed)
Referred and patient aware

## 2015-04-21 NOTE — Telephone Encounter (Signed)
pls refer to PT twice weekly for 6 weeks,,I will sign

## 2015-04-21 NOTE — Telephone Encounter (Signed)
Still having lower back pain and left sided sciatica and wants to know if she should be referred to PT or go see a chiropractor. Please advise

## 2015-04-21 NOTE — Addendum Note (Signed)
Addended by: Eual Fines on: 04/21/2015 05:05 PM   Modules accepted: Orders

## 2015-04-22 ENCOUNTER — Other Ambulatory Visit: Payer: Self-pay

## 2015-04-22 MED ORDER — LINAGLIPTIN 5 MG PO TABS: 5.0000 mg | ORAL_TABLET | Freq: Every day | ORAL | Status: DC

## 2015-04-22 MED ORDER — SIMVASTATIN 20 MG PO TABS: 20.0000 mg | ORAL_TABLET | Freq: Every day | ORAL | Status: DC

## 2015-05-05 ENCOUNTER — Telehealth: Payer: Self-pay | Admitting: Family Medicine

## 2015-05-05 DIAGNOSIS — M549 Dorsalgia, unspecified: Secondary | ICD-10-CM

## 2015-05-05 NOTE — Telephone Encounter (Signed)
Ok to refer to to dr Noemi [facility] in [place] for her lower back pain. States she has been there before and would like to go back. Please advise

## 2015-05-05 NOTE — Telephone Encounter (Signed)
Referral entered  

## 2015-05-05 NOTE — Telephone Encounter (Signed)
Patient having back issues, has questions regarding physical therapy, please advise?

## 2015-05-05 NOTE — Addendum Note (Signed)
Addended by: Eual Fines on: 05/05/2015 01:33 PM   Modules accepted: Orders

## 2015-05-05 NOTE — Telephone Encounter (Signed)
Ok to refer, I will sign 

## 2015-05-09 DIAGNOSIS — M545 Low back pain: Secondary | ICD-10-CM | POA: Diagnosis not present

## 2015-05-09 DIAGNOSIS — M47816 Spondylosis without myelopathy or radiculopathy, lumbar region: Secondary | ICD-10-CM | POA: Diagnosis not present

## 2015-05-13 DIAGNOSIS — M79605 Pain in left leg: Secondary | ICD-10-CM | POA: Diagnosis not present

## 2015-05-13 DIAGNOSIS — M545 Low back pain: Secondary | ICD-10-CM | POA: Diagnosis not present

## 2015-05-13 DIAGNOSIS — M2560 Stiffness of unspecified joint, not elsewhere classified: Secondary | ICD-10-CM | POA: Diagnosis not present

## 2015-05-13 DIAGNOSIS — M47896 Other spondylosis, lumbar region: Secondary | ICD-10-CM | POA: Diagnosis not present

## 2015-05-19 ENCOUNTER — Other Ambulatory Visit: Payer: Self-pay

## 2015-05-19 MED ORDER — SIMVASTATIN 20 MG PO TABS: 20.0000 mg | ORAL_TABLET | Freq: Every day | ORAL | Status: DC

## 2015-05-22 DIAGNOSIS — R262 Difficulty in walking, not elsewhere classified: Secondary | ICD-10-CM | POA: Diagnosis not present

## 2015-05-22 DIAGNOSIS — M545 Low back pain: Secondary | ICD-10-CM | POA: Diagnosis not present

## 2015-05-22 DIAGNOSIS — M5442 Lumbago with sciatica, left side: Secondary | ICD-10-CM | POA: Diagnosis not present

## 2015-05-22 DIAGNOSIS — M549 Dorsalgia, unspecified: Secondary | ICD-10-CM | POA: Diagnosis not present

## 2015-05-28 DIAGNOSIS — M545 Low back pain: Secondary | ICD-10-CM | POA: Diagnosis not present

## 2015-05-28 DIAGNOSIS — M549 Dorsalgia, unspecified: Secondary | ICD-10-CM | POA: Diagnosis not present

## 2015-05-28 DIAGNOSIS — R262 Difficulty in walking, not elsewhere classified: Secondary | ICD-10-CM | POA: Diagnosis not present

## 2015-05-28 DIAGNOSIS — M5442 Lumbago with sciatica, left side: Secondary | ICD-10-CM | POA: Diagnosis not present

## 2015-05-29 ENCOUNTER — Other Ambulatory Visit: Payer: Self-pay | Admitting: "Endocrinology

## 2015-05-29 DIAGNOSIS — N183 Chronic kidney disease, stage 3 (moderate): Secondary | ICD-10-CM | POA: Diagnosis not present

## 2015-05-29 DIAGNOSIS — E039 Hypothyroidism, unspecified: Secondary | ICD-10-CM | POA: Diagnosis not present

## 2015-05-29 DIAGNOSIS — E1122 Type 2 diabetes mellitus with diabetic chronic kidney disease: Secondary | ICD-10-CM | POA: Diagnosis not present

## 2015-05-29 LAB — BASIC METABOLIC PANEL
BUN: 18 mg/dL (ref 7–25)
CHLORIDE: 103 mmol/L (ref 98–110)
CO2: 26 mmol/L (ref 20–31)
Calcium: 9.3 mg/dL (ref 8.6–10.4)
Creat: 1.52 mg/dL — ABNORMAL HIGH (ref 0.50–0.99)
GLUCOSE: 179 mg/dL — AB (ref 65–99)
POTASSIUM: 4.2 mmol/L (ref 3.5–5.3)
Sodium: 138 mmol/L (ref 135–146)

## 2015-05-29 LAB — T4, FREE: Free T4: 1.2 ng/dL (ref 0.8–1.8)

## 2015-05-29 LAB — TSH: TSH: 0.58 mIU/L

## 2015-05-30 DIAGNOSIS — M47896 Other spondylosis, lumbar region: Secondary | ICD-10-CM | POA: Diagnosis not present

## 2015-05-30 DIAGNOSIS — M7062 Trochanteric bursitis, left hip: Secondary | ICD-10-CM | POA: Diagnosis not present

## 2015-05-30 LAB — HEMOGLOBIN A1C
Hgb A1c MFr Bld: 8.6 % — ABNORMAL HIGH (ref <5.7)
Mean Plasma Glucose: 200 mg/dL — ABNORMAL HIGH (ref <117)

## 2015-06-04 DIAGNOSIS — M549 Dorsalgia, unspecified: Secondary | ICD-10-CM | POA: Diagnosis not present

## 2015-06-04 DIAGNOSIS — M5442 Lumbago with sciatica, left side: Secondary | ICD-10-CM | POA: Diagnosis not present

## 2015-06-04 DIAGNOSIS — M545 Low back pain: Secondary | ICD-10-CM | POA: Diagnosis not present

## 2015-06-04 DIAGNOSIS — R262 Difficulty in walking, not elsewhere classified: Secondary | ICD-10-CM | POA: Diagnosis not present

## 2015-06-06 ENCOUNTER — Ambulatory Visit (INDEPENDENT_AMBULATORY_CARE_PROVIDER_SITE_OTHER): Payer: Commercial Managed Care - HMO | Admitting: "Endocrinology

## 2015-06-06 ENCOUNTER — Encounter: Payer: Self-pay | Admitting: "Endocrinology

## 2015-06-06 VITALS — BP 152/89 | HR 90 | Ht 61.5 in | Wt 168.0 lb

## 2015-06-06 DIAGNOSIS — E039 Hypothyroidism, unspecified: Secondary | ICD-10-CM

## 2015-06-06 DIAGNOSIS — I10 Essential (primary) hypertension: Secondary | ICD-10-CM

## 2015-06-06 DIAGNOSIS — E785 Hyperlipidemia, unspecified: Secondary | ICD-10-CM

## 2015-06-06 DIAGNOSIS — N183 Chronic kidney disease, stage 3 (moderate): Secondary | ICD-10-CM

## 2015-06-06 DIAGNOSIS — R262 Difficulty in walking, not elsewhere classified: Secondary | ICD-10-CM | POA: Diagnosis not present

## 2015-06-06 DIAGNOSIS — M549 Dorsalgia, unspecified: Secondary | ICD-10-CM | POA: Diagnosis not present

## 2015-06-06 DIAGNOSIS — M5442 Lumbago with sciatica, left side: Secondary | ICD-10-CM | POA: Diagnosis not present

## 2015-06-06 DIAGNOSIS — E1122 Type 2 diabetes mellitus with diabetic chronic kidney disease: Secondary | ICD-10-CM | POA: Diagnosis not present

## 2015-06-06 DIAGNOSIS — M545 Low back pain: Secondary | ICD-10-CM | POA: Diagnosis not present

## 2015-06-06 NOTE — Progress Notes (Signed)
Subjective:    Patient ID: Christine Dominguez, female    DOB: 05-Jun-1950, PCP Tula Nakayama, MD   Past Medical History  Diagnosis Date  . Chronic back pain 2007    Disabled   . Obesity   . Hydrocephalus     Treated with VP shunt and then intracranial tumor resection (?Cholesteatoma); no longer followed actively by neurosurgery or otolaryngology  . Type 2 diabetes mellitus ([place]) 1991  . Essential hypertension, benign 1980  . GERD (gastroesophageal reflux disease) 2000  . Hypothyroidism 2013  . Hyperlipidemia 2013  . Arthritis     Cervical spine  . Neuropathy ([place])   . Anemia   . Cancer ([place]) 1998, recurred in 2012    Paraganglioma of jugular tympamicum, recurrent in 2012   Past Surgical History  Procedure Laterality Date  . Ventriculoperitoneal shunt  1998    At [facility]  . Intracranial mass resected  1998    At [facility]; ? location-right middle ear  . Hemorrhoidectomy    . Trigger finger release      Rght thumb  . Tubal ligation  1980  . Colonoscopy  06/2010  . Tubal ligation    . Cataract extraction w/phaco Right 07/17/2013    Procedure: CATARACT EXTRACTION PHACO AND INTRAOCULAR LENS PLACEMENT (IOC);  Surgeon: [name], MD;  Location: [facility];  Service: Ophthalmology;  Laterality: Right;  CDE:7.24  . Teeth pulled     Social History   Social History  . Marital Status: Widowed    Spouse Name: N/A  . Number of Children: 3  . Years of Education: N/A   Occupational History  . disabled     Social History Main Topics  . Smoking status: Former Smoker -- 0.50 packs/day for 15 years    Types: Cigarettes    Quit date: 07/07/2001  . Smokeless tobacco: Never Used  . Alcohol Use: No  . Drug Use: No  . Sexual Activity: No     Comment: tubal   Other Topics Concern  . None   Social History Narrative   Mother of 3 - all children are deceased         Outpatient Encounter Prescriptions as of 06/06/2015  Medication Sig  . aspirin EC 81 MG tablet Take 81 mg by  mouth at bedtime.  . clonazePAM (KLONOPIN) 0.5 MG tablet Take 0.5 tablets (0.25 mg total) by mouth 2 (two) times daily as needed for anxiety.  . cloNIDine (CATAPRES) 0.1 MG tablet TAKE 1 TABLET EVERY DAY  . docusate sodium (STOOL SOFTENER) 100 MG capsule Take 100 mg by mouth as needed for mild constipation.  . gabapentin (NEURONTIN) 100 MG capsule TAKE 1 CAPSULE TWICE DAILY  . glucose (SUNMARK GLUCOSE) 4 GM chewable tablet Chew by mouth as needed.   . hydrochlorothiazide (HYDRODIURIL) 12.5 MG tablet TAKE 1 TABLET EVERY DAY  . insulin glargine (LANTUS SOLOSTAR) 100 UNIT/ML injection Inject 35 Units into the skin at bedtime.  . Iron 66 MG TABS Take 1 tablet by mouth daily.   Marland Kitchen levothyroxine (SYNTHROID, LEVOTHROID) 75 MCG tablet TAKE 1 TABLET EVERY DAY BEFORE BREAKFAST  . linagliptin (TRADJENTA) 5 MG TABS tablet Take 1 tablet (5 mg total) by mouth daily.  Marland Kitchen nystatin (MYCOSTATIN) powder APPLY TOPICALLY AT BEDTIME TO RASH ON LOWER ABDOMEN AS NEEDED  . omeprazole (PRILOSEC) 40 MG capsule TAKE 1 CAPSULE ONE TIME DAILY  . Simethicone (GAS RELIEF DROPS PO) Take by mouth as needed.  . simvastatin (ZOCOR) 20 MG  tablet Take 1 tablet (20 mg total) by mouth at bedtime.  Marland Kitchen tiZANidine (ZANAFLEX) 2 MG tablet Take 1 tablet (2 mg total) by mouth daily as needed for muscle spasms.  . valsartan (DIOVAN) 320 MG tablet TAKE 1 TABLET EVERY DAY (DOSE INCREASE)  . Vitamin D, Ergocalciferol, (DRISDOL) 50000 UNITS CAPS capsule TAKE 1 CAPSULE EVERY 7 DAYS  . glucose blood (ACCU-CHEK AVIVA) test strip Use as instructed tid. E11.65. Accu-chek aviva test strips  . sodium chloride (OCEAN) 0.65 % nasal spray 1 spray as needed.    No facility-administered encounter medications on file as of 06/06/2015.   ALLERGIES: Allergies  Allergen Reactions  . Amaryl   . Avandia [Rosiglitazone Maleate]   . Glimepiride Itching  . Glipizide Itching  . Invokana [Canagliflozin]   . Novolog [Insulin Aspart]   . Rosiglitazone     Other  reaction(s): OTHER  . Sitagliptin     Other reaction(s): OTHER  . Aspirin Nausea Only and Other (See Comments)    Other Reaction: GI Upset More of a burning feeling when she take it Burning in stomach, nausea   VACCINATION STATUS: Immunization History  Administered Date(s) Administered  . Influenza Split 02/15/2012  . Influenza Whole 01/06/2010  . Influenza,inj,Quad PF,36+ Mos 05/14/2013, 01/17/2014, 04/03/2015  . Pneumococcal Conjugate-13 08/08/2014  . Pneumococcal Polysaccharide-23 10/13/2009  . Tdap 08/24/2005    HPI  65- yr - old female with medical problem of type 2 DM, HTN, HPL, and hypothyroidism.  She also has hx of CKD stage 3 which is stable. She is here for f/u of her type 2 DM. She denies hypoglycemia, but she worries about it . A1c is same at 8.6%, generally improved from from 9.3%. She is compliant to her Lt4 and She is on Diovan for HTN. she remains on Tradjenta, along with Lantus   35 units daily at bedtime.    Review of Systems  Constitutional: Positive for fatigue. Negative for unexpected weight change.  HENT: Negative for trouble swallowing and voice change.   Eyes: Negative for visual disturbance.  Respiratory: Negative for cough, shortness of breath and wheezing.   Cardiovascular: Negative for chest pain, palpitations and leg swelling.  Gastrointestinal: Negative for nausea, vomiting and diarrhea.  Endocrine: Negative for cold intolerance, heat intolerance, polydipsia, polyphagia and polyuria.  Musculoskeletal: Negative for myalgias and arthralgias.  Skin: Negative for color change, pallor, rash and wound.  Neurological: Negative for seizures and headaches.  Psychiatric/Behavioral: Negative for suicidal ideas and confusion.    Objective:    BP 152/89 mmHg  Pulse 90  Ht 5' 1.5" (1.562 m)  Wt 168 lb (76.204 kg)  BMI 31.23 kg/m2  SpO2 98%  Wt Readings from Last 3 Encounters:  06/06/15 168 lb (76.204 kg)  04/03/15 171 lb (77.565 kg)  03/06/15 174 lb  (78.926 kg)    Physical Exam  Constitutional: She is oriented to person, place, and time. She appears well-developed.  HENT:  Head: Normocephalic and atraumatic.  Eyes: EOM are normal.  Neck: Normal range of motion. Neck supple. No tracheal deviation present. No thyromegaly present.  Cardiovascular: Normal rate and regular rhythm.   Pulmonary/Chest: Effort normal and breath sounds normal.  Abdominal: Soft. Bowel sounds are normal. There is no tenderness. There is no guarding.  Musculoskeletal: Normal range of motion. She exhibits no edema.  Neurological: She is alert and oriented to person, place, and time. She has normal reflexes. No cranial nerve deficit. Coordination normal.  Skin: Skin is warm and dry. No  rash noted. No erythema. No pallor.  Psychiatric: She has a normal mood and affect. Judgment normal.    Results for orders placed or performed in visit on Q000111Q  Basic metabolic panel  Result Value Ref Range   Sodium 138 135 - 146 mmol/L   Potassium 4.2 3.5 - 5.3 mmol/L   Chloride 103 98 - 110 mmol/L   CO2 26 20 - 31 mmol/L   Glucose, Bld 179 (H) 65 - 99 mg/dL   BUN 18 7 - 25 mg/dL   Creat 1.52 (H) 0.50 - 0.99 mg/dL   Calcium 9.3 8.6 - 10.4 mg/dL  TSH  Result Value Ref Range   TSH 0.58 mIU/L  T4, free  Result Value Ref Range   Free T4 1.2 0.8 - 1.8 ng/dL  Hemoglobin A1c  Result Value Ref Range   Hgb A1c MFr Bld 8.6 (H) <5.7 %   Mean Plasma Glucose 200 (H) <117 mg/dL   Diabetic Labs (most recent): Lab Results  Component Value Date   HGBA1C 8.6* 05/29/2015   HGBA1C 8.7* 02/25/2015   HGBA1C 9.3* 09/09/2014   Lipid Panel     Component Value Date/Time   CHOL 110* 11/25/2014 0706   TRIG 107 11/25/2014 0706   HDL 38* 11/25/2014 0706   CHOLHDL 2.9 11/25/2014 0706   VLDL 21 11/25/2014 0706   LDLCALC 51 11/25/2014 0706     Assessment & Plan:   1. Diabetes mellitus with stage 3 chronic kidney disease ([place]) -She remains at a high risk for more acute and  chronic complications of diabetes which include CAD, CVA, CKD, retinopathy, and neuropathy. These are all discussed in detail with the patient.  Patient came with slightly above target glucose profile, and  recent A1c of 8.6% unchanged from last visit.   Glucose logs and insulin administration records pertaining to this visit,  to be scanned into patient's records.  Recent labs reviewed.   - I have re-counseled the patient on diet management andweight loss  by adopting a carbohydrate restricted / protein rich  Diet.  - Suggestion is made for patient to avoid simple carbohydrates   from their diet including Cakes , Desserts, Ice Cream,  Soda (  diet and regular) , Sweet Tea , Candies,  Chips, Cookies, Artificial Sweeteners,   and "Sugar-free" Products .  This will help patient to have stable blood glucose profile and potentially avoid unintended  Weight gain.  - Patient is advised to stick to a routine mealtimes to eat 3 meals  a day and avoid unnecessary snacks ( to snack only to correct hypoglycemia).  - The patient will be  scheduled with Jearld Fenton, RDN, CDE for individualized DM education.  - I have approached patient with the following individualized plan to manage diabetes and patient agrees.  She does not want to tighten control ,  due to fear of hypoglycemia.  - I will continue Lantus 35 units qhs associated with blood glucose monitoring twice a day, Tradjenta 5 mg po qam. She did not like Invokana .  - Patient specific target  for A1c; LDL, HDL, Triglycerides, and  Waist Circumference were discussed in detail.  2) BP/HTN: Controlled. Continue current medications including ACEI/ARB. 3) Lipids/HPL:  continue statins. 4)  Weight/Diet: CDE consult in progress, exercise, and carbohydrates information provided.  5) hypothyroidism: She is euthyroid , however she would benefit from a slight increase in her thyroid hormone. I'll prescribed levothyroxine 75 g by mouth every morning.  -  We discussed  about correct intake of levothyroxine, at fasting, with water, separated by at least 30 minutes from breakfast, and separated by more than 4 hours from calcium, iron, multivitamins, acid reflux medications (PPIs). -Patient is made aware of the fact that thyroid hormone replacement is needed for life, dose to be adjusted by periodic monitoring of thyroid function tests.  6) Chronic Care/Health Maintenance:  -Patient is on ACEI/ARB and Statin medications and encouraged to continue to follow up with Ophthalmology, Podiatrist at least yearly or according to recommendations, and advised to  stay away from smoking. I have recommended yearly flu vaccine and pneumonia vaccination at least every 5 years; moderate intensity exercise for up to 150 minutes weekly; and  sleep for at least 7 hours a day.  - 25 minutes of time was spent on the care of this patient , 50% of which was applied for counseling on diabetes complications and their preventions.  - I advised patient to maintain close follow up with Tula Nakayama, MD for primary care needs.  Patient is asked to bring meter and  blood glucose logs during their next visit.   Follow up plan: -Return in about 3 months (around 09/06/2015) for diabetes, high blood pressure, high cholesterol, underactive thyroid, follow up with pre-visit labs, meter, and logs.  Glade Lloyd, MD Phone: [phone]  Fax: [phone]   06/06/2015, 10:26 AM

## 2015-06-06 NOTE — Patient Instructions (Signed)

## 2015-06-07 ENCOUNTER — Encounter (HOSPITAL_COMMUNITY): Payer: Self-pay | Admitting: Emergency Medicine

## 2015-06-07 ENCOUNTER — Emergency Department (HOSPITAL_COMMUNITY)
Admission: EM | Admit: 2015-06-07 | Discharge: 2015-06-07 | Disposition: A | Discharge status: Home / Self Care | Payer: Commercial Managed Care - HMO | Attending: Emergency Medicine | Admitting: Emergency Medicine

## 2015-06-07 ENCOUNTER — Emergency Department (HOSPITAL_COMMUNITY): Payer: Commercial Managed Care - HMO

## 2015-06-07 DIAGNOSIS — R1010 Upper abdominal pain, unspecified: Secondary | ICD-10-CM | POA: Diagnosis not present

## 2015-06-07 DIAGNOSIS — E1140 Type 2 diabetes mellitus with diabetic neuropathy, unspecified: Secondary | ICD-10-CM | POA: Diagnosis not present

## 2015-06-07 DIAGNOSIS — Z859 Personal history of malignant neoplasm, unspecified: Secondary | ICD-10-CM | POA: Insufficient documentation

## 2015-06-07 DIAGNOSIS — E785 Hyperlipidemia, unspecified: Secondary | ICD-10-CM | POA: Diagnosis not present

## 2015-06-07 DIAGNOSIS — Z7982 Long term (current) use of aspirin: Secondary | ICD-10-CM | POA: Insufficient documentation

## 2015-06-07 DIAGNOSIS — Z982 Presence of cerebrospinal fluid drainage device: Secondary | ICD-10-CM | POA: Diagnosis not present

## 2015-06-07 DIAGNOSIS — E669 Obesity, unspecified: Secondary | ICD-10-CM | POA: Diagnosis not present

## 2015-06-07 DIAGNOSIS — Z87891 Personal history of nicotine dependence: Secondary | ICD-10-CM | POA: Diagnosis not present

## 2015-06-07 DIAGNOSIS — R112 Nausea with vomiting, unspecified: Secondary | ICD-10-CM | POA: Insufficient documentation

## 2015-06-07 DIAGNOSIS — R197 Diarrhea, unspecified: Secondary | ICD-10-CM | POA: Diagnosis not present

## 2015-06-07 DIAGNOSIS — Z79899 Other long term (current) drug therapy: Secondary | ICD-10-CM | POA: Insufficient documentation

## 2015-06-07 DIAGNOSIS — R1110 Vomiting, unspecified: Secondary | ICD-10-CM | POA: Diagnosis present

## 2015-06-07 LAB — URINALYSIS, ROUTINE W REFLEX MICROSCOPIC
BILIRUBIN URINE: NEGATIVE
GLUCOSE, UA: NEGATIVE mg/dL
Hgb urine dipstick: NEGATIVE
KETONES UR: NEGATIVE mg/dL
Nitrite: NEGATIVE
PH: 5.5 (ref 5.0–8.0)
PROTEIN: NEGATIVE mg/dL
Specific Gravity, Urine: 1.02 (ref 1.005–1.030)

## 2015-06-07 LAB — CBC WITH DIFFERENTIAL/PLATELET
Basophils Absolute: 0 10*3/uL (ref 0.0–0.1)
Basophils Relative: 0 %
EOS ABS: 0.1 10*3/uL (ref 0.0–0.7)
Eosinophils Relative: 1 %
HEMATOCRIT: 37.1 % (ref 36.0–46.0)
HEMOGLOBIN: 11.9 g/dL — AB (ref 12.0–15.0)
LYMPHS ABS: 1.8 10*3/uL (ref 0.7–4.0)
Lymphocytes Relative: 14 %
MCH: 27.2 pg (ref 26.0–34.0)
MCHC: 32.1 g/dL (ref 30.0–36.0)
MCV: 84.7 fL (ref 78.0–100.0)
MONOS PCT: 5 %
Monocytes Absolute: 0.6 10*3/uL (ref 0.1–1.0)
NEUTROS ABS: 10.6 10*3/uL — AB (ref 1.7–7.7)
NEUTROS PCT: 80 %
Platelets: 235 10*3/uL (ref 150–400)
RBC: 4.38 MIL/uL (ref 3.87–5.11)
RDW: 13.3 % (ref 11.5–15.5)
WBC: 13.1 10*3/uL — AB (ref 4.0–10.5)

## 2015-06-07 LAB — COMPREHENSIVE METABOLIC PANEL
ALK PHOS: 68 U/L (ref 38–126)
ALT: 16 U/L (ref 14–54)
AST: 14 U/L — ABNORMAL LOW (ref 15–41)
Albumin: 4.1 g/dL (ref 3.5–5.0)
Anion gap: 10 (ref 5–15)
BILIRUBIN TOTAL: 0.2 mg/dL — AB (ref 0.3–1.2)
BUN: 35 mg/dL — ABNORMAL HIGH (ref 6–20)
CALCIUM: 9.3 mg/dL (ref 8.9–10.3)
CO2: 24 mmol/L (ref 22–32)
CREATININE: 1.49 mg/dL — AB (ref 0.44–1.00)
Chloride: 103 mmol/L (ref 101–111)
GFR calc non Af Amer: 36 mL/min — ABNORMAL LOW (ref >60)
GFR, EST AFRICAN AMERICAN: 42 mL/min — AB (ref >60)
GLUCOSE: 232 mg/dL — AB (ref 65–99)
Potassium: 4.2 mmol/L (ref 3.5–5.1)
SODIUM: 137 mmol/L (ref 135–145)
TOTAL PROTEIN: 8.2 g/dL — AB (ref 6.5–8.1)

## 2015-06-07 LAB — TROPONIN I: Troponin I: 0.03 ng/mL (ref <0.031)

## 2015-06-07 LAB — URINE MICROSCOPIC-ADD ON

## 2015-06-07 LAB — CBG MONITORING, ED
GLUCOSE-CAPILLARY: 224 mg/dL — AB (ref 65–99)
Glucose-Capillary: 179 mg/dL — ABNORMAL HIGH (ref 65–99)

## 2015-06-07 LAB — LIPASE, BLOOD: Lipase: 41 U/L (ref 11–51)

## 2015-06-07 MED ORDER — FAMOTIDINE IN NACL 20-0.9 MG/50ML-% IV SOLN
20.0000 mg | Freq: Once | INTRAVENOUS | Status: AC
  Administered 2015-06-07: 20 mg via INTRAVENOUS
  Filled 2015-06-07: qty 50

## 2015-06-07 MED ORDER — ONDANSETRON HCL 4 MG PO TABS: 4.0000 mg | ORAL_TABLET | Freq: Three times a day (TID) | ORAL | Status: DC | PRN

## 2015-06-07 MED ORDER — SODIUM CHLORIDE 0.9 % IV SOLN
INTRAVENOUS | Status: DC
  Administered 2015-06-07: 18:00:00 via INTRAVENOUS

## 2015-06-07 MED ORDER — ONDANSETRON HCL 4 MG/2ML IJ SOLN
4.0000 mg | INTRAMUSCULAR | Status: DC | PRN
  Administered 2015-06-07: 4 mg via INTRAVENOUS
  Filled 2015-06-07: qty 2

## 2015-06-07 NOTE — ED Provider Notes (Signed)
CSN: [id number]     Arrival date & time 06/07/15  1536 History   First MD Initiated Contact with Patient 06/07/15 1727     Chief Complaint  Patient presents with  . Emesis      HPI Pt was seen at 1730. Per pt, c/o gradual onset and persistence of multiple intermittent episodes of N/V that began 2 hours ago. Has been associated with upper abd "pain" and several episodes of "diarrhea" for the past few days. Denies CP/SOB, no back pain, no fevers, no black or blood in stools or emesis.     Past Medical History  Diagnosis Date  . Chronic back pain 2007    Disabled   . Obesity   . Hydrocephalus     Treated with VP shunt and then intracranial tumor resection (?Cholesteatoma); no longer followed actively by neurosurgery or otolaryngology  . Type 2 diabetes mellitus ([place]) 1991  . Essential hypertension, benign 1980  . GERD (gastroesophageal reflux disease) 2000  . Hypothyroidism 2013  . Hyperlipidemia 2013  . Arthritis     Cervical spine  . Neuropathy ([place])   . Anemia   . Cancer ([place]) 1998, recurred in 2012    Paraganglioma of jugular tympamicum, recurrent in 2012   Past Surgical History  Procedure Laterality Date  . Ventriculoperitoneal shunt  1998    At [facility]  . Intracranial mass resected  1998    At [facility]; ? location-right middle ear  . Hemorrhoidectomy    . Trigger finger release      Rght thumb  . Tubal ligation  1980  . Colonoscopy  06/2010  . Tubal ligation    . Cataract extraction w/phaco Right 07/17/2013    Procedure: CATARACT EXTRACTION PHACO AND INTRAOCULAR LENS PLACEMENT (IOC);  Surgeon: [name], MD;  Location: [facility];  Service: Ophthalmology;  Laterality: Right;  CDE:7.24  . Teeth pulled     Family History  Problem Relation Age of Onset  . Lung cancer Mother   . Hypertension Mother 83  . Hypertension Brother   . Diabetes Brother   . Diabetes Sister   . Kidney failure Brother   . Hypertension Maternal Grandmother   . Heart disease Maternal Grandmother    . Other Son     stillborn  . Other Son     died at birth  . Diabetes Maternal Grandfather   . Other Son     was murdered   Social History  Substance Use Topics  . Smoking status: Former Smoker -- 0.50 packs/day for 15 years    Types: Cigarettes    Quit date: 07/07/2001  . Smokeless tobacco: Never Used  . Alcohol Use: No   OB History    Gravida Para Term Preterm AB TAB SAB Ectopic Multiple Living   3 2   1   1  1      Review of Systems ROS: Statement: All systems negative except as marked or noted in the HPI; Constitutional: Negative for fever and chills. ; ; Eyes: Negative for eye pain, redness and discharge. ; ; ENMT: Negative for ear pain, hoarseness, nasal congestion, sinus pressure and sore throat. ; ; Cardiovascular: Negative for chest pain, palpitations, diaphoresis, dyspnea and peripheral edema. ; ; Respiratory: Negative for cough, wheezing and stridor. ; ; Gastrointestinal: +N/V/D, abd pain. Negative for blood in stool, hematemesis, jaundice and rectal bleeding. . ; ; Genitourinary: Negative for dysuria, flank pain and hematuria. ; ; Musculoskeletal: Negative for back pain and neck pain. Negative  for swelling and trauma.; ; Skin: Negative for pruritus, rash, abrasions, blisters, bruising and skin lesion.; ; Neuro: Negative for headache, lightheadedness and neck stiffness. Negative for weakness, altered level of consciousness , altered mental status, extremity weakness, paresthesias, involuntary movement, seizure and syncope.      Allergies  Amaryl; Avandia; Glimepiride; Glipizide; Invokana; Lyrica; Novolog; Rosiglitazone; Sitagliptin; and Aspirin  Home Medications   Prior to Admission medications   Medication Sig Start Date End Date Taking? Authorizing Provider  aspirin EC 81 MG tablet Take 81 mg by mouth at bedtime.    Historical Provider, MD  clonazePAM (KLONOPIN) 0.5 MG tablet Take 0.5 tablets (0.25 mg total) by mouth 2 (two) times daily as needed for anxiety. 01/21/15    [name], MD  cloNIDine (CATAPRES) 0.1 MG tablet TAKE 1 TABLET EVERY DAY 01/06/15   Satira Sark, MD  docusate sodium (STOOL SOFTENER) 100 MG capsule Take 100 mg by mouth as needed for mild constipation.    Historical Provider, MD  gabapentin (NEURONTIN) 100 MG capsule TAKE 1 CAPSULE TWICE DAILY 04/21/15   [name], MD  glucose [facility] GLUCOSE) 4 GM chewable tablet Chew by mouth as needed.  07/29/11   Historical Provider, MD  glucose blood (ACCU-CHEK AVIVA) test strip Use as instructed tid. E11.65. Accu-chek aviva test strips 04/14/15   Cassandria Anger, MD  hydrochlorothiazide (HYDRODIURIL) 12.5 MG tablet TAKE 1 TABLET EVERY DAY 11/15/14   Imogene Burn, PA-C  insulin glargine (LANTUS SOLOSTAR) 100 UNIT/ML injection Inject 35 Units into the skin at bedtime.    Historical Provider, MD  Iron 66 MG TABS Take 1 tablet by mouth daily.     Historical Provider, MD  levothyroxine (SYNTHROID, LEVOTHROID) 75 MCG tablet TAKE 1 TABLET EVERY DAY BEFORE BREAKFAST 04/18/15   Cassandria Anger, MD  linagliptin (TRADJENTA) 5 MG TABS tablet Take 1 tablet (5 mg total) by mouth daily. 04/22/15   Cassandria Anger, MD  nystatin (MYCOSTATIN) powder APPLY TOPICALLY AT BEDTIME TO RASH ON LOWER ABDOMEN AS NEEDED 04/21/15   [name], MD  omeprazole (PRILOSEC) 40 MG capsule TAKE 1 CAPSULE ONE TIME DAILY 02/20/15   [name], MD  Simethicone (GAS RELIEF DROPS PO) Take by mouth as needed.    Historical Provider, MD  simvastatin (ZOCOR) 20 MG tablet Take 1 tablet (20 mg total) by mouth at bedtime. 05/19/15   Cassandria Anger, MD  sodium chloride (OCEAN) 0.65 % nasal spray 1 spray as needed.  12/04/13   Historical Provider, MD  tiZANidine (ZANAFLEX) 2 MG tablet Take 1 tablet (2 mg total) by mouth daily as needed for muscle spasms. 01/01/15   [name], MD  valsartan (DIOVAN) 320 MG tablet TAKE 1 TABLET EVERY DAY (DOSE INCREASE) 03/11/15   [name], MD  Vitamin D,  Ergocalciferol, (DRISDOL) 50000 UNITS CAPS capsule TAKE 1 CAPSULE EVERY 7 DAYS 04/01/15   [name], MD   BP 184/79 mmHg  Pulse 83  Resp 13  SpO2 100%   17:58 Orthostatic Vital Signs CS  Orthostatic Lying  - BP- Lying: 178/83 mmHg ; Pulse- Lying: 83  Orthostatic Sitting - BP- Sitting: 168/73 mmHg ; Pulse- Sitting: 78  Orthostatic Standing at 0 minutes - BP- Standing at 0 minutes: 160/78 mmHg ; Pulse- Standing at 0 minutes: 72      Physical Exam  1735: Physical examination:  Nursing notes reviewed; Vital signs and O2 SAT reviewed;  Constitutional: Well developed, Well nourished,  Well hydrated, In no acute distress; Head:  Normocephalic, atraumatic; Eyes: EOMI, PERRL, No scleral icterus; ENMT: Mouth and pharynx normal, Mucous membranes moist; Neck: Supple, Full range of motion, No lymphadenopathy; Cardiovascular: Regular rate and rhythm, No gallop; Respiratory: Breath sounds clear & equal bilaterally, No wheezes.  Speaking full sentences with ease, Normal respiratory effort/excursion; Chest: Nontender, Movement normal; Abdomen: Soft, +mid-epigastric and LUQ tenderness to palp. No rebound or guarding. Nondistended, Normal bowel sounds; Genitourinary: No CVA tenderness; Extremities: Pulses normal, No tenderness, No edema, No calf edema or asymmetry.; Neuro: AA&Ox3, Major CN grossly intact.  Speech clear. No gross focal motor or sensory deficits in extremities.; Skin: Color normal, Warm, Dry.   ED Course  Procedures (including critical care time) Labs Review  Imaging Review  I have personally reviewed and evaluated these images and lab results as part of my medical decision-making.   EKG Interpretation None      MDM  MDM Reviewed: previous chart, nursing note and vitals Reviewed previous: labs and ECG Interpretation: labs, ECG, x-ray and CT scan     ED ECG REPORT   Date: 06/07/2015  Rate: 82  Rhythm: normal sinus rhythm  QRS Axis: normal  Intervals: normal  ST/T  Wave abnormalities: normal  Conduction Disutrbances:none  Narrative Interpretation: artifact  Old EKG Reviewed: unchanged; no significant changes compared to previous EKG dated 08/04/14.  Results for orders placed or performed during the hospital encounter of 06/07/15  CBC with Differential  Result Value Ref Range   WBC 13.1 (H) 4.0 - 10.5 K/uL   RBC 4.38 3.87 - 5.11 MIL/uL   Hemoglobin 11.9 (L) 12.0 - 15.0 g/dL   HCT 37.1 36.0 - 46.0 %   MCV 84.7 78.0 - 100.0 fL   MCH 27.2 26.0 - 34.0 pg   MCHC 32.1 30.0 - 36.0 g/dL   RDW 13.3 11.5 - 15.5 %   Platelets 235 150 - 400 K/uL   Neutrophils Relative % 80 %   Neutro Abs 10.6 (H) 1.7 - 7.7 K/uL   Lymphocytes Relative 14 %   Lymphs Abs 1.8 0.7 - 4.0 K/uL   Monocytes Relative 5 %   Monocytes Absolute 0.6 0.1 - 1.0 K/uL   Eosinophils Relative 1 %   Eosinophils Absolute 0.1 0.0 - 0.7 K/uL   Basophils Relative 0 %   Basophils Absolute 0.0 0.0 - 0.1 K/uL  Comprehensive metabolic panel  Result Value Ref Range   Sodium 137 135 - 145 mmol/L   Potassium 4.2 3.5 - 5.1 mmol/L   Chloride 103 101 - 111 mmol/L   CO2 24 22 - 32 mmol/L   Glucose, Bld 232 (H) 65 - 99 mg/dL   BUN 35 (H) 6 - 20 mg/dL   Creatinine, Ser 1.49 (H) 0.44 - 1.00 mg/dL   Calcium 9.3 8.9 - 10.3 mg/dL   Total Protein 8.2 (H) 6.5 - 8.1 g/dL   Albumin 4.1 3.5 - 5.0 g/dL   AST 14 (L) 15 - 41 U/L   ALT 16 14 - 54 U/L   Alkaline Phosphatase 68 38 - 126 U/L   Total Bilirubin 0.2 (L) 0.3 - 1.2 mg/dL   GFR calc non Af Amer 36 (L) >60 mL/min   GFR calc Af Amer 42 (L) >60 mL/min   Anion gap 10 5 - 15  Urinalysis, Routine w reflex microscopic (not at [facility])  Result Value Ref Range   Color, Urine YELLOW YELLOW   APPearance CLEAR CLEAR   Specific Gravity, Urine 1.020 1.005 -  1.030   pH 5.5 5.0 - 8.0   Glucose, UA NEGATIVE NEGATIVE mg/dL   Hgb urine dipstick NEGATIVE NEGATIVE   Bilirubin Urine NEGATIVE NEGATIVE   Ketones, ur NEGATIVE NEGATIVE mg/dL   Protein, ur NEGATIVE NEGATIVE  mg/dL   Nitrite NEGATIVE NEGATIVE   Leukocytes, UA TRACE (A) NEGATIVE  Troponin I  Result Value Ref Range   Troponin I 0.03 <0.031 ng/mL  Lipase, blood  Result Value Ref Range   Lipase 41 11 - 51 U/L  Urine microscopic-add on  Result Value Ref Range   Squamous Epithelial / LPF 0-5 (A) NONE SEEN   WBC, UA 0-5 0 - 5 WBC/hpf   RBC / HPF 0-5 0 - 5 RBC/hpf   Bacteria, UA RARE (A) NONE SEEN  CBG monitoring, ED  Result Value Ref Range   Glucose-Capillary 179 (H) 65 - 99 mg/dL   Ct Head Wo Contrast 06/07/2015  CLINICAL DATA:  Nausea, vomiting, patient thought blood sugar was low, history VP shunt and 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 COMPARISON:  Temporal bone CT 12/29/2010 FINDINGS: VP shunt via LEFT frontal approach tip at foramen of Monro. Slight asymmetry of lateral ventricles RIGHT larger than LEFT, unchanged. No midline shift or mass effect. Otherwise normal appearance of brain parenchyma. No intracranial hemorrhage, mass lesion or evidence acute infarction. Interval resection of RIGHT temporal bone, mastoid air cells, portion extra auditory canal and into middle ear. Bone destruction extending into petrous bone. Soft tissue attenuation within the surgical bed could represent residual or recurrent tumor, postoperative changes, less likely related to radiation therapy. LEFT temporal bone is intact. Paranasal sinuses clear. Calvaria otherwise unremarkable. 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 VP shunt without hydrocephalus.  No other intracranial abnormalities. Electronically Signed   By: Lavonia Dana M.D.   On: 06/07/2015 18:48   Dg Abd Acute W/chest 06/07/2015  CLINICAL DATA:  Dizziness. Abdominal pain. nausea and vomiting. VP shunt. EXAM: DG ABDOMEN ACUTE W/ 1V CHEST COMPARISON:  11/18/2010 FINDINGS: There is no evidence of dilated bowel loops or free intraperitoneal air. No radiopaque calculi or other significant radiographic abnormality is seen. VP shunt tubing again seen in the left anterior chest wall and extending into the pelvis. Heart size and mediastinal contours are within normal limits. Both lungs are clear. IMPRESSION: VP shunt noted. Normal bowel gas pattern. No active cardiopulmonary disease. Electronically Signed   By: Earle Gell M.D.   On: 06/07/2015 18:51   Results for PHOENYX, GACIA (MRN [id number]) as of 06/07/2015 20:19  Ref. Range 08/04/2014 01:06 09/09/2014 00:00 11/25/2014 07:06 05/29/2015 09:56 06/07/2015 17:03  BUN Latest Ref Range: 6-20 mg/dL 16  15 18  35 (H)  Creatinine Latest Ref Range: 0.44-1.00 mg/dL 1.27 (H) 1.4 (A) 1.30 (H) 1.52 (H) 1.49 (H)    1900:  T/C from Rads MD regarding CT scan findings: reviewed MRI from [facility] in 02/2015 with Rads MD, CT scan findings very similar to that MRI. Pt made aware of CT findings; states she "already knows there's a new tumor in there" and "I get regular MRI's to follow it."   2015:  Pt has tol PO well while in the ED without N/V.  No stooling while in the ED.  Abd benign, VSS. Feels better and wants to go home now. Tx symptomatically at this time.  Dx and testing d/w pt.  Questions answered.  Verb understanding, agreeable to d/c home with outpt f/u.       Francine Graven, DO 06/11/15 [phone]

## 2015-06-07 NOTE — ED Notes (Signed)
Patient tolerating fluid PO.  

## 2015-06-07 NOTE — ED Notes (Signed)
Patient c/o nausea and vomiting. Denies any diarrhea or fevers. Patient states "thought blood sugar was dropping, checked blood sugar prior to drinking juice." Per patient blood sugar was 120, she reports drink juice after checking sugar and feeling better briefly. Patient also reports some mid abd pain.

## 2015-06-07 NOTE — ED Notes (Signed)
Patient tolerated fluids well 

## 2015-06-07 NOTE — Discharge Instructions (Signed)
Take the prescription as directed.  Increase your fluid intake (ie:  Gatoraide) for the next few days, as discussed.  Eat a bland diet and advance to your regular diet slowly as you can tolerate it.   Avoid full strength juices, as well as milk and milk products until your diarrhea has resolved.   Call your regular medical doctor Monday to schedule a follow up appointment in the next 2 days.  Return to the Emergency Department immediately if not improving (or even worsening) despite taking the medicines as prescribed, any black or bloody stool or vomit, if you develop a fever over "101," or for any other concerns. ° °

## 2015-06-09 ENCOUNTER — Telehealth: Payer: Self-pay | Admitting: Family Medicine

## 2015-06-09 ENCOUNTER — Ambulatory Visit: Payer: Commercial Managed Care - HMO | Admitting: Family Medicine

## 2015-06-09 NOTE — Telephone Encounter (Signed)
Patient is stating that she was in the ER on Sat 06/07/15 with a virus, they told her to call her PCP today, they gave her something for nausea but she in no better, please advise?

## 2015-06-09 NOTE — Telephone Encounter (Signed)
Patient is calling back asking if something can be called in, she cant get a way here this afternoon, please advise?

## 2015-06-09 NOTE — Telephone Encounter (Signed)
Still feeling hot/cold and very nauseated and cannot eat. Offered appt today at 1

## 2015-06-12 ENCOUNTER — Ambulatory Visit: Payer: Commercial Managed Care - HMO

## 2015-06-12 ENCOUNTER — Other Ambulatory Visit: Payer: Self-pay

## 2015-06-12 ENCOUNTER — Telehealth: Payer: Self-pay | Admitting: Family Medicine

## 2015-06-12 ENCOUNTER — Telehealth: Payer: Self-pay | Admitting: "Endocrinology

## 2015-06-12 VITALS — BP 150/70 | HR 90 | Resp 16

## 2015-06-12 DIAGNOSIS — I10 Essential (primary) hypertension: Secondary | ICD-10-CM

## 2015-06-12 MED ORDER — ONDANSETRON HCL 4 MG PO TABS
4.0000 mg | ORAL_TABLET | Freq: Three times a day (TID) | ORAL | Status: DC | PRN
Start: 2015-06-12 — End: 2015-07-02

## 2015-06-12 MED ORDER — GLUCOSE BLOOD VI STRP: ORAL_STRIP | Status: DC

## 2015-06-12 NOTE — Telephone Encounter (Signed)
Declined shot of zofran. See nurse visit

## 2015-06-12 NOTE — Telephone Encounter (Signed)
pt needs acucheck sent to pharmacy and alcohol pads, fax to [phone]

## 2015-06-12 NOTE — Telephone Encounter (Signed)
States she went to the ER on 3/4 for nausea and vomiting and was told it was a virus. States she still feels bad, nauseated and weak but no vomiting. Also she has not taken her bP meds in a few days bc her BP is running low. Advised to come in for nurse visit today to check her pressure and maybe get a shot for nausea? Also any bloodwork needed? Please advise

## 2015-06-12 NOTE — Telephone Encounter (Signed)
Prescription for test strips sent electronically & alcohol pads sent via fax.

## 2015-06-12 NOTE — Telephone Encounter (Signed)
Patient states she still is not feeling good after having a virus Saturday, she is asking if Dr. Moshe Cipro could send her in something

## 2015-06-12 NOTE — Telephone Encounter (Signed)
Yes pls give zofran 4 mg IM and needs if blood pressure low may need to return to ED she was dehydrated with elevated WBC and shifty suggestib=ve of bacterial infection Let me knwo when she comes in what her vitals are pls

## 2015-06-12 NOTE — Progress Notes (Signed)
zofran refilled. Per dr, call back if anything changes but reports eating better and normal urine output

## 2015-06-12 NOTE — Patient Instructions (Signed)
Keep next follow up visit  Zofran has been refilled  Return to the ER for worsening symptoms/weakness

## 2015-06-13 ENCOUNTER — Telehealth: Payer: Self-pay | Admitting: Cardiology

## 2015-06-13 NOTE — Telephone Encounter (Signed)
Patient will continue all meds as directed

## 2015-06-13 NOTE — Telephone Encounter (Signed)
[id number] 

## 2015-06-13 NOTE — Telephone Encounter (Signed)
Patient states that she is taking clonidine, htcz & losartan for her BP. She's been checking her BP daily and it has been running around 110/59 and 108/59. The highest it's been was 120/70.  Patients wants to know if she needs to keep taking all 3 medicines. She states that she has been fighting a flu-like virus for 7 days and can't tell if it is her medicine or the virus that has her feeling dizzy.   Patient is scheduled to have a 6 month f/u with Dr. Domenic Polite on 07/16/15.

## 2015-06-13 NOTE — Telephone Encounter (Signed)
Chart reviewed. I would not change her antihypertensive regimen at this time. She has been on clonidine, HCTZ, and Diovan with generally good blood pressure control, although at times she has been hypertensive even on this regimen. No changes for now.

## 2015-06-16 ENCOUNTER — Encounter (HOSPITAL_COMMUNITY): Payer: Self-pay | Admitting: *Deleted

## 2015-06-16 ENCOUNTER — Emergency Department (HOSPITAL_COMMUNITY): Payer: Commercial Managed Care - HMO

## 2015-06-16 ENCOUNTER — Emergency Department (HOSPITAL_COMMUNITY)
Admission: EM | Admit: 2015-06-16 | Discharge: 2015-06-17 | Disposition: A | Discharge status: Home / Self Care | Payer: Commercial Managed Care - HMO | Attending: Emergency Medicine | Admitting: Emergency Medicine

## 2015-06-16 DIAGNOSIS — I10 Essential (primary) hypertension: Secondary | ICD-10-CM | POA: Diagnosis not present

## 2015-06-16 DIAGNOSIS — R531 Weakness: Secondary | ICD-10-CM | POA: Diagnosis not present

## 2015-06-16 DIAGNOSIS — R195 Other fecal abnormalities: Secondary | ICD-10-CM | POA: Insufficient documentation

## 2015-06-16 DIAGNOSIS — E039 Hypothyroidism, unspecified: Secondary | ICD-10-CM | POA: Insufficient documentation

## 2015-06-16 DIAGNOSIS — E119 Type 2 diabetes mellitus without complications: Secondary | ICD-10-CM | POA: Diagnosis not present

## 2015-06-16 DIAGNOSIS — E785 Hyperlipidemia, unspecified: Secondary | ICD-10-CM | POA: Diagnosis not present

## 2015-06-16 DIAGNOSIS — Z794 Long term (current) use of insulin: Secondary | ICD-10-CM | POA: Diagnosis not present

## 2015-06-16 DIAGNOSIS — E669 Obesity, unspecified: Secondary | ICD-10-CM | POA: Diagnosis not present

## 2015-06-16 DIAGNOSIS — M0888 Other juvenile arthritis, other specified site: Secondary | ICD-10-CM | POA: Insufficient documentation

## 2015-06-16 DIAGNOSIS — R42 Dizziness and giddiness: Secondary | ICD-10-CM | POA: Diagnosis not present

## 2015-06-16 DIAGNOSIS — Z79899 Other long term (current) drug therapy: Secondary | ICD-10-CM | POA: Diagnosis not present

## 2015-06-16 DIAGNOSIS — Z87891 Personal history of nicotine dependence: Secondary | ICD-10-CM | POA: Diagnosis not present

## 2015-06-16 LAB — CBG MONITORING, ED: GLUCOSE-CAPILLARY: 197 mg/dL — AB (ref 65–99)

## 2015-06-16 MED ORDER — SODIUM CHLORIDE 0.9 % IV BOLUS (SEPSIS)
1000.0000 mL | Freq: Once | INTRAVENOUS | Status: AC
  Administered 2015-06-17: 1000 mL via INTRAVENOUS

## 2015-06-16 NOTE — ED Notes (Signed)
Pt c/o weakness and chills and loose stools; pt states she was seen here for same complaint a few days ago and was given nausea meds

## 2015-06-16 NOTE — ED Notes (Signed)
Pt requesting something to eat; pt informed she is not able to have anything until she is seen by EDP, pt states her CBG decreases at night and is requesting it to be checked

## 2015-06-16 NOTE — ED Notes (Signed)
Pt states she has had intermittent feelings of weakness for the past week. Pt denies V/D/, and abd pain, states soft stools.

## 2015-06-16 NOTE — ED Provider Notes (Signed)
TIME SEEN: 11:40 PM  CHIEF COMPLAINT: Generalized weakness, lightheadedness  HPI: Pt is a 65 y.o. female with history of hydrocephalus with VP shunt status post intracranial tumor resection, hypertension, diabetes, hyperlipidemia, hypothyroidism who presents emergency department generalized weakness. Reports that on 06/07/15 she was seen in the emergency department for nausea, vomiting and diarrhea. She reports that she has not had any vomiting since and her diarrhea has improved and she still having loose stools twice a day. States that she has felt intermittently weak, hot and cold. She is also had intermittent lightheadedness. She is not lightheaded currently. She denies any headache, chest pain or shortness of breath, abdominal pain. No focal weakness or numbness. In her emergency department visit on March 4 she has a leukocytosis of 13.1 but otherwise labs are unremarkable. She has been unremarkable shunt series as well. Patient denies any current fevers, chills.  States she has had a dry cough. States she's had a good appetite. She states that she thinks this could be her blood pressure medication. States that sometimes at home she will have systolic blood pressures in the low 100s which concerns her. States she just saw her PCP Dr. Moshe Cipro 2 days ago and discuss this with Dr. Moshe Cipro who expressed to patient that this was a normal blood pressure. She states she is scheduled to see her cardiologist Dr. Domenic Polite next week. She denies any recent changes in her blood pressure medication. Blood pressure in the emergency department is 140/50's. States she did have a blood pressure at home tonight in the 190s/90s.  ROS: See HPI Constitutional: no fever  Eyes: no drainage  ENT: no runny nose   Cardiovascular:  no chest pain  Resp: no SOB  GI: no vomiting GU: no dysuria Integumentary: no rash  Allergy: no hives  Musculoskeletal: no leg swelling  Neurological: no slurred speech ROS otherwise  negative  PAST MEDICAL HISTORY/PAST SURGICAL HISTORY:  Past Medical History  Diagnosis Date  . Chronic back pain 2007    Disabled   . Obesity   . Hydrocephalus     Treated with VP shunt and then intracranial tumor resection (?Cholesteatoma); no longer followed actively by neurosurgery or otolaryngology  . Type 2 diabetes mellitus ([place]) 1991  . Essential hypertension, benign 1980  . GERD (gastroesophageal reflux disease) 2000  . Hypothyroidism 2013  . Hyperlipidemia 2013  . Arthritis     Cervical spine  . Neuropathy ([place])   . Anemia   . Cancer ([place]) 1998, recurred in 2012    Paraganglioma of jugular tympamicum, recurrent in 2012    MEDICATIONS:  Prior to Admission medications   Medication Sig Start Date End Date Taking? Authorizing Provider  aspirin EC 81 MG tablet Take 81 mg by mouth at bedtime.    Historical Provider, MD  clonazePAM (KLONOPIN) 0.5 MG tablet Take 0.5 tablets (0.25 mg total) by mouth 2 (two) times daily as needed for anxiety. 01/21/15   [name], MD  cloNIDine (CATAPRES) 0.1 MG tablet TAKE 1 TABLET EVERY DAY 01/06/15   Satira Sark, MD  docusate sodium (STOOL SOFTENER) 100 MG capsule Take 100 mg by mouth as needed for mild constipation.    Historical Provider, MD  gabapentin (NEURONTIN) 100 MG capsule TAKE 1 CAPSULE TWICE DAILY 04/21/15   [name], MD  glucose [facility] GLUCOSE) 4 GM chewable tablet Chew by mouth as needed.  07/29/11   Historical Provider, MD  glucose blood (ACCU-CHEK AVIVA) test strip Use as instructed tid. E11.65.  Accu-chek aviva test strips 06/12/15   Cassandria Anger, MD  hydrochlorothiazide (HYDRODIURIL) 12.5 MG tablet TAKE 1 TABLET EVERY DAY 11/15/14   Imogene Burn, PA-C  insulin glargine (LANTUS SOLOSTAR) 100 UNIT/ML injection Inject 35 Units into the skin at bedtime.    Historical Provider, MD  Iron 66 MG TABS Take 1 tablet by mouth daily.     Historical Provider, MD  levothyroxine (SYNTHROID, LEVOTHROID) 75 MCG  tablet TAKE 1 TABLET EVERY DAY BEFORE BREAKFAST 04/18/15   Cassandria Anger, MD  linagliptin (TRADJENTA) 5 MG TABS tablet Take 1 tablet (5 mg total) by mouth daily. 04/22/15   Cassandria Anger, MD  nystatin (MYCOSTATIN) powder APPLY TOPICALLY AT BEDTIME TO RASH ON LOWER ABDOMEN AS NEEDED 04/21/15   [name], MD  omeprazole (PRILOSEC) 40 MG capsule TAKE 1 CAPSULE ONE TIME DAILY 02/20/15   [name], MD  ondansetron (ZOFRAN) 4 MG tablet Take 1 tablet (4 mg total) by mouth every 8 (eight) hours as needed for nausea or vomiting. 06/12/15   [name], MD  Simethicone (GAS RELIEF DROPS PO) Take by mouth as needed.    Historical Provider, MD  simvastatin (ZOCOR) 20 MG tablet Take 1 tablet (20 mg total) by mouth at bedtime. 05/19/15   Cassandria Anger, MD  sodium chloride (OCEAN) 0.65 % nasal spray 1 spray as needed.  12/04/13   Historical Provider, MD  tiZANidine (ZANAFLEX) 2 MG tablet Take 1 tablet (2 mg total) by mouth daily as needed for muscle spasms. 01/01/15   [name], MD  valsartan (DIOVAN) 320 MG tablet TAKE 1 TABLET EVERY DAY (DOSE INCREASE) Patient taking differently: TAKE 1 TABLET EVERY DAY 03/11/15   [name], MD  Vitamin D, Ergocalciferol, (DRISDOL) 50000 UNITS CAPS capsule TAKE 1 CAPSULE EVERY 7 DAYS 04/01/15   [name], MD    ALLERGIES:  Allergies  Allergen Reactions  . Amaryl   . Avandia [Rosiglitazone Maleate]   . Glimepiride Itching  . Glipizide Itching  . Invokana [Canagliflozin]   . Lyrica [Pregabalin] Nausea And Vomiting  . Novolog [Insulin Aspart]   . Rosiglitazone     Other reaction(s): OTHER  . Sitagliptin     Other reaction(s): OTHER  . Aspirin Nausea Only and Other (See Comments)    Other Reaction: GI Upset More of a burning feeling when she take it Burning in stomach, nausea    SOCIAL HISTORY:  Social History  Substance Use Topics  . Smoking status: Former Smoker -- 0.50 packs/day for 15 years     Types: Cigarettes    Quit date: 07/07/2001  . Smokeless tobacco: Never Used  . Alcohol Use: No    FAMILY HISTORY: Family History  Problem Relation Age of Onset  . Lung cancer Mother   . Hypertension Mother 89  . Hypertension Brother   . Diabetes Brother   . Diabetes Sister   . Kidney failure Brother   . Hypertension Maternal Grandmother   . Heart disease Maternal Grandmother   . Other Son     stillborn  . Other Son     died at birth  . Diabetes Maternal Grandfather   . Other Son     was murdered    EXAM: BP 142/57 mmHg  Pulse 79  Temp(Src) 98.6 F (37 C) (Oral)  Resp 18  Ht 5' 1.5" (1.562 m)  Wt 167 lb (75.751 kg)  BMI 31.05 kg/m2  SpO2 99% CONSTITUTIONAL: Alert and oriented  and responds appropriately to questions. Well-appearing; well-nourished HEAD: Normocephalic EYES: Conjunctivae clear, PERRL ENT: normal nose; no rhinorrhea; moist mucous membranes NECK: Supple, no meningismus, no LAD  CARD: RRR; S1 and S2 appreciated; no murmurs, no clicks, no rubs, no gallops RESP: Normal chest excursion without splinting or tachypnea; breath sounds  and equal bilaterally but there are mild ex. Wheezes, no rhonchi or rales, no hypoxia or respiratory distress, speaking full sentences ABD/GI: Normal bowel sounds; non-distended; soft, non-tender, no rebound, no guarding, no peritoneal signs BACK:  The back appears normal and is non-tender to palpation, there is no CVA tenderness EXT: Normal ROM in all joints; non-tender to palpation; no edema; normal capillary refill; no cyanosis, no calf tenderness or swelling    SKIN: Normal color for age and race; warm; no rash NEURO: Moves all extremities equally, sensation to light touch intact diffusely, cranial nerves II through XII intact, Strength 5/5 in all 4 extremities  PSYCH: The patient's mood and manner are appropriate. Grooming and personal hygiene are appropriate.  MEDICAL DECISION MAKING: Patient here with complaints of  generalized weakness. No infectious symptoms other than mild cough. No focal neurologic deficits on exam. Discussed with patient that this could be from dehydration from recent nausea, vomiting or diarrhea. Her abdominal exam is benign. She thinks it is secondary to her blood pressure medications. Blood pressure today is normal. Advised her to continue taking her blood pressure medication as prescribed and keep a log of her blood pressure that she could present this to her primary care physician. She has an appointment to see her cardiologist next week. We'll obtain labs, chest x-ray, urine. Will also obtain orthostatic vital signs and give IV fluids. I do not feel she needs another shunt series today.  ED PROGRESS: Patient's labs are unremarkable other than mild leukocytosis with left shift. This is improving from 10 days ago. Sodium was slightly low at 129. She is receiving IV fluids. Troponin negative. Urine shows small leukocytes but no other sign of infection. She is not having urinary symptoms.  Chest x-ray is clear. She is not orthostatic. Reports feeling better after IV hydration. Discussed with her that this could have been mild dehydration from recent vomiting and diarrhea. Have recommended increased fluid intake at home. She has been mildly hypertensive in the emergency department. Discussed the patient and have recommended she continue her blood pressure medication as prescribed and follow-up with Dr. Moshe Cipro as an outpatient. Discussed return precautions. She verbalized understanding and is comfortable with this plan.    EKG Interpretation  Date/Time:  Monday June 16 2015 23:56:47 EDT Ventricular Rate:  84 PR Interval:  164 QRS Duration: 104 QT Interval:  365 QTC Calculation: 431 R Axis:   28 Text Interpretation:  Sinus rhythm No significant change since last tracing Confirmed by Arzella Rehmann,  DO, [place] [phone]) on 06/17/2015 12:08:01 AM         [place], DO 06/17/15 [id number]

## 2015-06-17 ENCOUNTER — Telehealth: Payer: Self-pay | Admitting: Family Medicine

## 2015-06-17 ENCOUNTER — Ambulatory Visit: Payer: Commercial Managed Care - HMO | Admitting: Podiatry

## 2015-06-17 DIAGNOSIS — R531 Weakness: Secondary | ICD-10-CM | POA: Diagnosis not present

## 2015-06-17 LAB — URINALYSIS, ROUTINE W REFLEX MICROSCOPIC
BILIRUBIN URINE: NEGATIVE
Glucose, UA: NEGATIVE mg/dL
Hgb urine dipstick: NEGATIVE
KETONES UR: NEGATIVE mg/dL
NITRITE: NEGATIVE
Protein, ur: NEGATIVE mg/dL
SPECIFIC GRAVITY, URINE: 1.01 (ref 1.005–1.030)
pH: 5.5 (ref 5.0–8.0)

## 2015-06-17 LAB — COMPREHENSIVE METABOLIC PANEL
ALBUMIN: 4 g/dL (ref 3.5–5.0)
ALT: 19 U/L (ref 14–54)
AST: 16 U/L (ref 15–41)
Alkaline Phosphatase: 68 U/L (ref 38–126)
Anion gap: 7 (ref 5–15)
BILIRUBIN TOTAL: 0.4 mg/dL (ref 0.3–1.2)
BUN: 23 mg/dL — AB (ref 6–20)
CHLORIDE: 94 mmol/L — AB (ref 101–111)
CO2: 28 mmol/L (ref 22–32)
CREATININE: 1.37 mg/dL — AB (ref 0.44–1.00)
Calcium: 9.4 mg/dL (ref 8.9–10.3)
GFR calc Af Amer: 46 mL/min — ABNORMAL LOW (ref >60)
GFR, EST NON AFRICAN AMERICAN: 40 mL/min — AB (ref >60)
GLUCOSE: 186 mg/dL — AB (ref 65–99)
Potassium: 4.4 mmol/L (ref 3.5–5.1)
Sodium: 129 mmol/L — ABNORMAL LOW (ref 135–145)
Total Protein: 8 g/dL (ref 6.5–8.1)

## 2015-06-17 LAB — CBC WITH DIFFERENTIAL/PLATELET
Basophils Absolute: 0 10*3/uL (ref 0.0–0.1)
Basophils Relative: 0 %
EOS ABS: 0.2 10*3/uL (ref 0.0–0.7)
EOS PCT: 1 %
HEMATOCRIT: 35.6 % — AB (ref 36.0–46.0)
Hemoglobin: 11.5 g/dL — ABNORMAL LOW (ref 12.0–15.0)
LYMPHS ABS: 1.8 10*3/uL (ref 0.7–4.0)
Lymphocytes Relative: 15 %
MCH: 27.1 pg (ref 26.0–34.0)
MCHC: 32.3 g/dL (ref 30.0–36.0)
MCV: 83.8 fL (ref 78.0–100.0)
Monocytes Absolute: 0.8 10*3/uL (ref 0.1–1.0)
Monocytes Relative: 7 %
Neutro Abs: 9.3 10*3/uL — ABNORMAL HIGH (ref 1.7–7.7)
Neutrophils Relative %: 77 %
PLATELETS: 190 10*3/uL (ref 150–400)
RBC: 4.25 MIL/uL (ref 3.87–5.11)
RDW: 13.2 % (ref 11.5–15.5)
WBC: 12 10*3/uL — AB (ref 4.0–10.5)

## 2015-06-17 LAB — URINE MICROSCOPIC-ADD ON: RBC / HPF: NONE SEEN RBC/hpf (ref 0–5)

## 2015-06-17 LAB — TROPONIN I: Troponin I: 0.03 ng/mL (ref <0.031)

## 2015-06-17 LAB — CBG MONITORING, ED: Glucose-Capillary: 155 mg/dL — ABNORMAL HIGH (ref 65–99)

## 2015-06-17 NOTE — Progress Notes (Signed)
Put Pts necklace back on after imaging per Pt request.

## 2015-06-17 NOTE — Telephone Encounter (Signed)
States she is feeling better. Will call in the am if she starts back feeling bad and I will order labs for her to do in the am if so

## 2015-06-17 NOTE — Telephone Encounter (Signed)
Patient is calling stating that she was back in the ER last night and she states that she is still dehydrated and her electrolytes were abnormal, possibly needing potassium, she still doesn't feel well and she is asking what should she do, please adivse?

## 2015-06-17 NOTE — Discharge Instructions (Signed)

## 2015-06-17 NOTE — Telephone Encounter (Signed)
pls let her know i reviewed Ed notes Sodium, not potassium was low She received IV flyuids (and stated she felt better) Since calling stating not feeling good I recommend chen 7 todfday and ED follow up appt with me eitherThurdsday or next Monday

## 2015-06-17 NOTE — Progress Notes (Signed)
Pts gold necklace was removed and given to Pts daughter per Pts request prior to imaging.

## 2015-06-17 NOTE — Telephone Encounter (Signed)
States she was back in the ER lastnight and wants you to review her visit/labs and let her know what she needs to do because she still doesn't feel well and some of her labs were abnormal. Please advise

## 2015-06-18 ENCOUNTER — Other Ambulatory Visit: Payer: Self-pay | Admitting: Family Medicine

## 2015-06-19 ENCOUNTER — Other Ambulatory Visit: Payer: Self-pay

## 2015-06-19 MED ORDER — GLUCOSE BLOOD VI STRP: ORAL_STRIP | Status: DC

## 2015-06-23 DIAGNOSIS — R262 Difficulty in walking, not elsewhere classified: Secondary | ICD-10-CM | POA: Diagnosis not present

## 2015-06-23 DIAGNOSIS — M549 Dorsalgia, unspecified: Secondary | ICD-10-CM | POA: Diagnosis not present

## 2015-06-23 DIAGNOSIS — M545 Low back pain: Secondary | ICD-10-CM | POA: Diagnosis not present

## 2015-06-23 DIAGNOSIS — M5442 Lumbago with sciatica, left side: Secondary | ICD-10-CM | POA: Diagnosis not present

## 2015-06-25 DIAGNOSIS — M5442 Lumbago with sciatica, left side: Secondary | ICD-10-CM | POA: Diagnosis not present

## 2015-06-25 DIAGNOSIS — M545 Low back pain: Secondary | ICD-10-CM | POA: Diagnosis not present

## 2015-06-25 DIAGNOSIS — M47896 Other spondylosis, lumbar region: Secondary | ICD-10-CM | POA: Diagnosis not present

## 2015-06-25 DIAGNOSIS — R262 Difficulty in walking, not elsewhere classified: Secondary | ICD-10-CM | POA: Diagnosis not present

## 2015-06-25 DIAGNOSIS — M549 Dorsalgia, unspecified: Secondary | ICD-10-CM | POA: Diagnosis not present

## 2015-06-26 DIAGNOSIS — M5442 Lumbago with sciatica, left side: Secondary | ICD-10-CM | POA: Diagnosis not present

## 2015-06-26 DIAGNOSIS — R262 Difficulty in walking, not elsewhere classified: Secondary | ICD-10-CM | POA: Diagnosis not present

## 2015-06-26 DIAGNOSIS — M545 Low back pain: Secondary | ICD-10-CM | POA: Diagnosis not present

## 2015-06-26 DIAGNOSIS — M549 Dorsalgia, unspecified: Secondary | ICD-10-CM | POA: Diagnosis not present

## 2015-07-01 ENCOUNTER — Telehealth: Payer: Self-pay | Admitting: Family Medicine

## 2015-07-01 NOTE — Telephone Encounter (Signed)
Patient scheduled for 3/30

## 2015-07-01 NOTE — Telephone Encounter (Signed)
Patient is calling stating that she is feeling bad again, feeling weak, she states that her blood pressure is ok, she is not sure whats going on

## 2015-07-02 ENCOUNTER — Emergency Department (HOSPITAL_COMMUNITY)
Admission: EM | Admit: 2015-07-02 | Discharge: 2015-07-02 | Disposition: A | Discharge status: Home / Self Care | Payer: Commercial Managed Care - HMO | Attending: Emergency Medicine | Admitting: Emergency Medicine

## 2015-07-02 ENCOUNTER — Encounter (HOSPITAL_COMMUNITY): Payer: Self-pay | Admitting: *Deleted

## 2015-07-02 DIAGNOSIS — R197 Diarrhea, unspecified: Secondary | ICD-10-CM

## 2015-07-02 DIAGNOSIS — E039 Hypothyroidism, unspecified: Secondary | ICD-10-CM | POA: Insufficient documentation

## 2015-07-02 DIAGNOSIS — Z79899 Other long term (current) drug therapy: Secondary | ICD-10-CM | POA: Insufficient documentation

## 2015-07-02 DIAGNOSIS — R112 Nausea with vomiting, unspecified: Secondary | ICD-10-CM | POA: Insufficient documentation

## 2015-07-02 DIAGNOSIS — I10 Essential (primary) hypertension: Secondary | ICD-10-CM | POA: Diagnosis not present

## 2015-07-02 DIAGNOSIS — Z8589 Personal history of malignant neoplasm of other organs and systems: Secondary | ICD-10-CM | POA: Insufficient documentation

## 2015-07-02 DIAGNOSIS — E669 Obesity, unspecified: Secondary | ICD-10-CM | POA: Diagnosis not present

## 2015-07-02 DIAGNOSIS — E1140 Type 2 diabetes mellitus with diabetic neuropathy, unspecified: Secondary | ICD-10-CM | POA: Insufficient documentation

## 2015-07-02 DIAGNOSIS — Z794 Long term (current) use of insulin: Secondary | ICD-10-CM | POA: Insufficient documentation

## 2015-07-02 DIAGNOSIS — E785 Hyperlipidemia, unspecified: Secondary | ICD-10-CM | POA: Insufficient documentation

## 2015-07-02 DIAGNOSIS — M1990 Unspecified osteoarthritis, unspecified site: Secondary | ICD-10-CM | POA: Diagnosis not present

## 2015-07-02 DIAGNOSIS — M549 Dorsalgia, unspecified: Secondary | ICD-10-CM | POA: Diagnosis not present

## 2015-07-02 LAB — CBG MONITORING, ED: GLUCOSE-CAPILLARY: 165 mg/dL — AB (ref 65–99)

## 2015-07-02 LAB — COMPREHENSIVE METABOLIC PANEL
ALT: 18 U/L (ref 14–54)
AST: 14 U/L — AB (ref 15–41)
Albumin: 3.6 g/dL (ref 3.5–5.0)
Alkaline Phosphatase: 62 U/L (ref 38–126)
Anion gap: 7 (ref 5–15)
BILIRUBIN TOTAL: 0.4 mg/dL (ref 0.3–1.2)
BUN: 15 mg/dL (ref 6–20)
CO2: 27 mmol/L (ref 22–32)
Calcium: 8.5 mg/dL — ABNORMAL LOW (ref 8.9–10.3)
Chloride: 98 mmol/L — ABNORMAL LOW (ref 101–111)
Creatinine, Ser: 1.32 mg/dL — ABNORMAL HIGH (ref 0.44–1.00)
GFR, EST AFRICAN AMERICAN: 49 mL/min — AB (ref >60)
GFR, EST NON AFRICAN AMERICAN: 42 mL/min — AB (ref >60)
GLUCOSE: 202 mg/dL — AB (ref 65–99)
POTASSIUM: 4.1 mmol/L (ref 3.5–5.1)
Sodium: 132 mmol/L — ABNORMAL LOW (ref 135–145)
Total Protein: 7.4 g/dL (ref 6.5–8.1)

## 2015-07-02 LAB — URINALYSIS, ROUTINE W REFLEX MICROSCOPIC
BILIRUBIN URINE: NEGATIVE
Glucose, UA: NEGATIVE mg/dL
Hgb urine dipstick: NEGATIVE
KETONES UR: NEGATIVE mg/dL
LEUKOCYTES UA: NEGATIVE
NITRITE: NEGATIVE
PH: 6.5 (ref 5.0–8.0)
PROTEIN: NEGATIVE mg/dL
Specific Gravity, Urine: 1.015 (ref 1.005–1.030)

## 2015-07-02 LAB — CBC
HCT: 33.1 % — ABNORMAL LOW (ref 36.0–46.0)
HEMOGLOBIN: 10.6 g/dL — AB (ref 12.0–15.0)
MCH: 27 pg (ref 26.0–34.0)
MCHC: 32 g/dL (ref 30.0–36.0)
MCV: 84.4 fL (ref 78.0–100.0)
PLATELETS: 218 10*3/uL (ref 150–400)
RBC: 3.92 MIL/uL (ref 3.87–5.11)
RDW: 13.6 % (ref 11.5–15.5)
WBC: 8.6 10*3/uL (ref 4.0–10.5)

## 2015-07-02 LAB — LIPASE, BLOOD: Lipase: 38 U/L (ref 11–51)

## 2015-07-02 MED ORDER — SODIUM CHLORIDE 0.9 % IV SOLN
1000.0000 mL | Freq: Once | INTRAVENOUS | Status: AC
  Administered 2015-07-02: 1000 mL via INTRAVENOUS

## 2015-07-02 MED ORDER — ONDANSETRON HCL 4 MG/2ML IJ SOLN
4.0000 mg | Freq: Once | INTRAMUSCULAR | Status: AC
  Administered 2015-07-02: 4 mg via INTRAVENOUS
  Filled 2015-07-02: qty 2

## 2015-07-02 MED ORDER — ONDANSETRON HCL 4 MG PO TABS: 4.0000 mg | ORAL_TABLET | Freq: Four times a day (QID) | ORAL | Status: DC

## 2015-07-02 MED ORDER — SODIUM CHLORIDE 0.9 % IV SOLN: 1000.0000 mL | INTRAVENOUS | Status: DC

## 2015-07-02 NOTE — ED Notes (Signed)
Pt states her nausea is gone.

## 2015-07-02 NOTE — ED Notes (Signed)
Pt reports she started feeling weak Monday and started having diarrhea at 0500 this morning. Now is feeling nauseated as well.

## 2015-07-02 NOTE — Discharge Instructions (Signed)
Your lab test are negative for acute changes. You have been given IV fluids today to replace what you loss with the diarrhea. Please discuss this with Dr. Camillia Herter" the workup can be completed. May use Zofran every 6 hours if needed for nausea. Please return to the emergency department if any changes, problems, or concerns before your visit with Dr. Moshe Cipro. Diarrhea Diarrhea is watery poop (stool). It can make you feel weak, tired, thirsty, or give you a dry mouth (signs of dehydration). Watery poop is a sign of another problem, most often an infection. It often lasts 2-3 days. It can last longer if it is a sign of something serious. Take care of yourself as told by your doctor. HOME CARE   Drink 1 cup (8 ounces) of fluid each time you have watery poop.  Do not drink the following fluids:  Those that contain simple sugars (fructose, glucose, galactose, lactose, sucrose, maltose).  Sports drinks.  Fruit juices.  Whole milk products.  Sodas.  Drinks with caffeine (coffee, tea, soda) or alcohol.  Oral rehydration solution may be used if the doctor says it is okay. You may make your own solution. Follow this recipe:   - teaspoon table salt.   teaspoon baking soda.   teaspoon salt substitute containing potassium chloride.  1 tablespoons sugar.  1 liter (34 ounces) of water.  Avoid the following foods:  High fiber foods, such as raw fruits and vegetables.  Nuts, seeds, and whole grain breads and cereals.   Those that are sweetened with sugar alcohols (xylitol, sorbitol, mannitol).  Try eating the following foods:  Starchy foods, such as rice, toast, pasta, low-sugar cereal, oatmeal, baked potatoes, crackers, and bagels.  Bananas.  Applesauce.  Eat probiotic-rich foods, such as yogurt and milk products that are fermented.  Wash your hands well after each time you have watery poop.  Only take medicine as told by your doctor.  Take a warm bath to help lessen burning or  pain from having watery poop. GET HELP RIGHT AWAY IF:   You cannot drink fluids without throwing up (vomiting).  You keep throwing up.  You have blood in your poop, or your poop looks black and tarry.  You do not pee (urinate) in 6-8 hours, or there is only a small amount of very dark pee.  You have belly (abdominal) pain that gets worse or stays in the same spot (localizes).  You are weak, dizzy, confused, or light-headed.  You have a very bad headache.  Your watery poop gets worse or does not get better.  You have a fever or lasting symptoms for more than 2-3 days.  You have a fever and your symptoms suddenly get worse. MAKE SURE YOU:   Understand these instructions.  Will watch your condition.  Will get help right away if you are not doing well or get worse.   This information is not intended to replace advice given to you by your health care provider. Make sure you discuss any questions you have with your health care provider.   Document Released: 09/08/2007 Document Revised: 04/12/2014 Document Reviewed: 11/28/2011 Elsevier Interactive Patient Education [employer].

## 2015-07-02 NOTE — ED Provider Notes (Signed)
CSN: [id number]     Arrival date & time 07/02/15  [id number] History   First MD Initiated Contact with Patient 07/02/15 1023     Chief Complaint  Patient presents with  . Diarrhea     (Consider location/radiation/quality/duration/timing/severity/associated sxs/prior Treatment) HPI Comments: Patient is a 65 year old female who presents to the emergency department with diarrhea and nausea. Patient states she has been sick since March 14. She's been having problems with weakness, nausea, vomiting, and diarrhea. It is of note that she has a history of hypertension, hyperlipidemia, diabetes, intracranial tumor resection with VP shunt, and hypothyroidism. She denies any blood in her stools. She denies any high fever. She is unsure of her thyroid status at this time. She reports decrease in her appetite, but states she is drinking fluids. At this time she continues to have nausea, diarrhea, and no weakness. She presents for evaluation and assistance with these issues.      Nausea, diarrhea, weakness. Diarrhea @ 5am. Sick since 3/14.  Patient is a 65 y.o. female presenting with diarrhea. The history is provided by the patient.  Diarrhea Associated symptoms: no abdominal pain, no arthralgias, no diaphoresis, no fever and no myalgias     Past Medical History  Diagnosis Date  . Chronic back pain 2007    Disabled   . Obesity   . Hydrocephalus     Treated with VP shunt and then intracranial tumor resection (?Cholesteatoma); no longer followed actively by neurosurgery or otolaryngology  . Type 2 diabetes mellitus ([place]) 1991  . Essential hypertension, benign 1980  . GERD (gastroesophageal reflux disease) 2000  . Hypothyroidism 2013  . Hyperlipidemia 2013  . Arthritis     Cervical spine  . Neuropathy ([place])   . Anemia   . Cancer ([place]) 1998, recurred in 2012    Paraganglioma of jugular tympamicum, recurrent in 2012   Past Surgical History  Procedure Laterality Date  . Ventriculoperitoneal shunt   1998    At [facility]  . Intracranial mass resected  1998    At [facility]; ? location-right middle ear  . Hemorrhoidectomy    . Trigger finger release      Rght thumb  . Tubal ligation  1980  . Colonoscopy  06/2010  . Tubal ligation    . Cataract extraction w/phaco Right 07/17/2013    Procedure: CATARACT EXTRACTION PHACO AND INTRAOCULAR LENS PLACEMENT (IOC);  Surgeon: [name], MD;  Location: [facility];  Service: Ophthalmology;  Laterality: Right;  CDE:7.24  . Teeth pulled     Family History  Problem Relation Age of Onset  . Lung cancer Mother   . Hypertension Mother 48  . Hypertension Brother   . Diabetes Brother   . Diabetes Sister   . Kidney failure Brother   . Hypertension Maternal Grandmother   . Heart disease Maternal Grandmother   . Other Son     stillborn  . Other Son     died at birth  . Diabetes Maternal Grandfather   . Other Son     was murdered   Social History  Substance Use Topics  . Smoking status: Former Smoker -- 0.50 packs/day for 15 years    Types: Cigarettes    Quit date: 07/07/2001  . Smokeless tobacco: Never Used  . Alcohol Use: No   OB History    Gravida Para Term Preterm AB TAB SAB Ectopic Multiple Living   3 2   1   1  1      Review  of Systems  Constitutional: Positive for appetite change and fatigue. Negative for fever, diaphoresis and activity change.       All ROS Neg except as noted in HPI  HENT: Negative for nosebleeds.   Eyes: Negative for photophobia and discharge.  Respiratory: Negative for cough, shortness of breath and wheezing.   Cardiovascular: Negative for chest pain, palpitations and leg swelling.  Gastrointestinal: Positive for nausea and diarrhea. Negative for abdominal pain and blood in stool.  Genitourinary: Negative for dysuria, frequency and hematuria.  Musculoskeletal: Positive for back pain. Negative for myalgias, arthralgias and neck pain.  Skin: Negative.  Negative for rash.  Neurological: Negative for dizziness, seizures  and speech difficulty.  Hematological: Negative.   Psychiatric/Behavioral: Negative for hallucinations and confusion.      Allergies  Amaryl; Avandia; Glimepiride; Glipizide; Invokana; Lyrica; Novolog; Rosiglitazone; Sitagliptin; and Aspirin  Home Medications   Prior to Admission medications   Medication Sig Start Date End Date Taking? Authorizing Provider  aspirin EC 81 MG tablet Take 81 mg by mouth at bedtime.    Historical Provider, MD  clonazePAM (KLONOPIN) 0.5 MG tablet Take 0.5 tablets (0.25 mg total) by mouth 2 (two) times daily as needed for anxiety. 01/21/15   [name], MD  cloNIDine (CATAPRES) 0.1 MG tablet TAKE 1 TABLET EVERY DAY 01/06/15   Satira Sark, MD  docusate sodium (STOOL SOFTENER) 100 MG capsule Take 100 mg by mouth as needed for mild constipation.    Historical Provider, MD  gabapentin (NEURONTIN) 100 MG capsule TAKE 1 CAPSULE TWICE DAILY 04/21/15   [name], MD  glucose [facility] GLUCOSE) 4 GM chewable tablet Chew by mouth as needed.  07/29/11   Historical Provider, MD  glucose blood (ACCU-CHEK AVIVA) test strip Use as instructed tid. E11.65. Accu-chek aviva test strips 06/19/15   Cassandria Anger, MD  hydrochlorothiazide (HYDRODIURIL) 12.5 MG tablet TAKE 1 TABLET EVERY DAY 11/15/14   Imogene Burn, PA-C  insulin glargine (LANTUS SOLOSTAR) 100 UNIT/ML injection Inject 35 Units into the skin at bedtime.    Historical Provider, MD  Iron 66 MG TABS Take 1 tablet by mouth daily.     Historical Provider, MD  levothyroxine (SYNTHROID, LEVOTHROID) 75 MCG tablet TAKE 1 TABLET EVERY DAY BEFORE BREAKFAST 04/18/15   Cassandria Anger, MD  linagliptin (TRADJENTA) 5 MG TABS tablet Take 1 tablet (5 mg total) by mouth daily. 04/22/15   Cassandria Anger, MD  nystatin (MYCOSTATIN) powder APPLY TOPICALLY AT BEDTIME TO RASH ON LOWER ABDOMEN AS NEEDED 04/21/15   [name], MD  omeprazole (PRILOSEC) 40 MG capsule TAKE 1 CAPSULE ONE TIME DAILY 06/19/15    [name], MD  ondansetron (ZOFRAN) 4 MG tablet Take 1 tablet (4 mg total) by mouth every 8 (eight) hours as needed for nausea or vomiting. 06/12/15   [name], MD  Simethicone (GAS RELIEF DROPS PO) Take by mouth as needed.    Historical Provider, MD  simvastatin (ZOCOR) 20 MG tablet Take 1 tablet (20 mg total) by mouth at bedtime. 05/19/15   Cassandria Anger, MD  sodium chloride (OCEAN) 0.65 % nasal spray 1 spray as needed.  12/04/13   Historical Provider, MD  tiZANidine (ZANAFLEX) 2 MG tablet Take 1 tablet (2 mg total) by mouth daily as needed for muscle spasms. 01/01/15   [name], MD  valsartan (DIOVAN) 320 MG tablet TAKE 1 TABLET EVERY DAY (DOSE INCREASE) Patient taking differently: TAKE 1 TABLET EVERY DAY  03/11/15   [name], MD  Vitamin D, Ergocalciferol, (DRISDOL) 50000 UNITS CAPS capsule TAKE 1 CAPSULE EVERY 7 DAYS 04/01/15   [name], MD   BP 158/80 mmHg  Pulse 85  Temp(Src) 98.2 F (36.8 C) (Temporal)  Resp 18  Ht 5\' 1"  (1.549 m)  Wt 75.751 kg  BMI 31.57 kg/m2  SpO2 100% Physical Exam  Constitutional: She is oriented to person, place, and time. She appears well-developed and well-nourished.  Non-toxic appearance.  HENT:  Head: Normocephalic.  Right Ear: Tympanic membrane and external ear normal.  Left Ear: Tympanic membrane and external ear normal.  Eyes: EOM and lids are normal. Pupils are equal, round, and reactive to light.  Neck: Normal range of motion. Neck supple. Carotid bruit is not present.  Cardiovascular: Normal rate, regular rhythm, normal heart sounds, intact distal pulses and normal pulses.   Pulmonary/Chest: Breath sounds normal. No respiratory distress.  Abdominal: Soft. Bowel sounds are normal. She exhibits no distension. There is no tenderness. There is no guarding.  Musculoskeletal: Normal range of motion. She exhibits no edema or tenderness.  Lymphadenopathy:       Head (right side): No submandibular  adenopathy present.       Head (left side): No submandibular adenopathy present.    She has no cervical adenopathy.  Neurological: She is alert and oriented to person, place, and time. She has normal strength. No cranial nerve deficit or sensory deficit. She exhibits normal muscle tone. Coordination normal.  Skin: Skin is warm and dry.  Psychiatric: She has a normal mood and affect. Her speech is normal.  Nursing note and vitals reviewed.   ED Course  Procedures (including critical care time) Labs Review Labs Reviewed  COMPREHENSIVE METABOLIC PANEL - Abnormal; Notable for the following:    Sodium 132 (*)    Chloride 98 (*)    Glucose, Bld 202 (*)    Creatinine, Ser 1.32 (*)    Calcium 8.5 (*)    AST 14 (*)    GFR calc non Af Amer 42 (*)    GFR calc Af Amer 49 (*)    All other components within normal limits  CBC - Abnormal; Notable for the following:    Hemoglobin 10.6 (*)    HCT 33.1 (*)    All other components within normal limits  LIPASE, BLOOD  URINALYSIS, ROUTINE W REFLEX MICROSCOPIC (NOT AT [facility])    Imaging Review No results found. I have personally reviewed and evaluated these images and lab results as part of my medical decision-making.   EKG Interpretation None      MDM  Vital signs reviewed. UA wnl. Lipase wnl. Cmet -reveals low sodium, and elevated creatine and glucose. CBC reveals mild anemia. Pt feels better after IV fluids and IV zofran. No diarrhea or vomiting after fluids and zofran. Rx for zofran given. Suspect viral illness. Pt to return if any changes or problem.   Final diagnoses:  None    *I have reviewed nursing notes, vital signs, and all appropriate lab and imaging results for this patient.**    Lily Kocher, PA-C 07/03/15 2046  Nat Christen, MD 07/04/15 [phone]

## 2015-07-03 ENCOUNTER — Encounter: Payer: Self-pay | Admitting: Family Medicine

## 2015-07-03 ENCOUNTER — Ambulatory Visit (INDEPENDENT_AMBULATORY_CARE_PROVIDER_SITE_OTHER): Payer: Commercial Managed Care - HMO | Admitting: Family Medicine

## 2015-07-03 VITALS — BP 178/98 | HR 96 | Resp 18 | Ht 61.5 in | Wt 172.0 lb

## 2015-07-03 DIAGNOSIS — D509 Iron deficiency anemia, unspecified: Secondary | ICD-10-CM | POA: Diagnosis not present

## 2015-07-03 DIAGNOSIS — N183 Chronic kidney disease, stage 3 unspecified: Secondary | ICD-10-CM

## 2015-07-03 DIAGNOSIS — E1122 Type 2 diabetes mellitus with diabetic chronic kidney disease: Secondary | ICD-10-CM | POA: Diagnosis not present

## 2015-07-03 DIAGNOSIS — I10 Essential (primary) hypertension: Secondary | ICD-10-CM

## 2015-07-03 DIAGNOSIS — D447 Neoplasm of uncertain behavior of aortic body and other paraganglia: Secondary | ICD-10-CM

## 2015-07-03 DIAGNOSIS — Z09 Encounter for follow-up examination after completed treatment for conditions other than malignant neoplasm: Secondary | ICD-10-CM | POA: Diagnosis not present

## 2015-07-03 DIAGNOSIS — E785 Hyperlipidemia, unspecified: Secondary | ICD-10-CM

## 2015-07-03 MED ORDER — CLONIDINE HCL 0.1 MG PO TABS: 0.1000 mg | ORAL_TABLET | Freq: Two times a day (BID) | ORAL | Status: DC

## 2015-07-03 NOTE — Patient Instructions (Addendum)
F/u first week in May, call if you need me sooner  INCREASE clonidine to one twice daily, blood pressure is too high  Non fast chem 7 and EGFR, Vit D, CBC and anemia panel 4 days before next appt  Thanks for choosing [facility], we consider it a privelige to serve you.

## 2015-07-03 NOTE — Progress Notes (Signed)
Subjective:    Patient ID: Christine Dominguez, female    DOB: 12-Aug-1950, 65 y.o.   MRN: [id number]  HPI  Pt in for f/u from ED , seen 3 times in March with V/D , most recent 1 day ago. C/o excessive fatigue , still ah's loose stool and her blood pressure is uncontrolled Has had intermittent chills , no recent  fever , feels weak Blod sugar elevated and uncontrolled, being treated by endo Review of Systems See HPI  Denies sinus pressure, nasal congestion, ear pain or sore throat. Denies chest congestion, productive cough or wheezing. Denies chest pains, palpitations and leg swelling  Denies dysuria, frequency, hesitancy or incontinence. Denies joint pain, swelling and limitation in mobility. Denies headaches, seizures, numbness, or tingling. Denies depression, anxiety or insomnia. Denies skin break down or rash.        Objective:   Physical Exam  BP 178/98 mmHg  Pulse 96  Resp 18  Ht 5' 1.5" (1.562 m)  Wt 172 lb (78.019 kg)  BMI 31.98 kg/m2  SpO2 99% Patient alert and oriented and in mild  cardiopulmonary distress.  HEENT: No facial asymmetry, EOMI,   oropharynx pink and moist.  Neck supple no JVD, no mass.  Chest: Clear to auscultation bilaterally.  CVS: S1, S2 no murmurs, no S3.Regular rate.  ABD: Soft diffuse superficial tenderness, with no guarding, or rebound increased BS.   Ext: No edema  MS: Adequate ROM spine, shoulders, hips and knees.  Skin: Intact, no ulcerations or rash noted.  Psych: Good eye contact, normal affect. Memory intact not anxious or depressed appearing.  CNS: CN 2-12 intact, power,  normal throughout.no focal deficits noted.       Assessment & Plan:  Essential hypertension, benign Uncontrolled increase dose clonidine f/u in 5 weeks DASH diet and commitment to daily physical activity for a minimum of 30 minutes discussed and encouraged, as a part of hypertension management. The importance of attaining a healthy weight is  also discussed.  BP/Weight 07/16/2015 07/03/2015 07/02/2015 06/17/2015 06/16/2015 [id number]  Systolic BP A999333 0000000 [id number] - Q000111Q Q000111Q  Diastolic BP 82 98 83 63 - 70 64  Wt. (Lbs) 171 172 167 - 167 - -  BMI 32.33 31.98 31.57 - 31.05 - -        Diabetes mellitus with stage 3 chronic kidney disease ([place]) Ms. Kyer is reminded of the importance of commitment to daily physical activity for 30 minutes or more, as able and the need to limit carbohydrate intake to 30 to 60 grams per meal to help with blood sugar control.   The need to take medication as prescribed, test blood sugar as directed, and to call between visits if there is a concern that blood sugar is uncontrolled is also discussed.   Ms. Defrain is reminded of the importance of daily foot exam, annual eye examination, and good blood sugar, blood pressure and cholesterol control. Uncontrolled managed by endo  Diabetic Labs Latest Ref Rng 07/02/2015 06/17/2015 06/07/2015 05/29/2015 02/25/2015  HbA1c <5.7 % - - - 8.6(H) 8.7(A)  Microalbumin 0.00 - 1.89 mg/dL - - - - -  Micro/Creat Ratio 0.0 - 30.0 mg/g - - - - -  Chol 125 - 200 mg/dL - - - - -  HDL >=46 mg/dL - - - - -  Calc LDL <130 mg/dL - - - - -  Triglycerides <150 mg/dL - - - - -  Creatinine 0.44 - 1.00 mg/dL 1.32(H) 1.37(H) 1.49(H) 1.52(H) -  BP/Weight 07/16/2015 07/03/2015 07/02/2015 06/17/2015 06/16/2015 [id number]  Systolic BP A999333 0000000 123XX123 123XX123 - Q000111Q Q000111Q  Diastolic BP 82 98 83 63 - 70 64  Wt. (Lbs) 171 172 167 - 167 - -  BMI 32.33 31.98 31.57 - 31.05 - -   Foot/eye exam completion dates Latest Ref Rng 11/19/2014 12/12/2013  Eye Exam No Retinopathy No Retinopathy -  Foot exam Order - - -  Foot Form Completion - - Done         Encounter for examination following treatment at hospital Three Ed visits in March 2017 with GI symptoms, most recent 3/29 and initial was 0308. Reports gradual symptom improvement, main complaint of weakness and fatigue, was  dehydrated, F/u labs needed in early May. Pt already has symptomatic meds at home  Hyperlipidemia Hyperlipidemia:Low fat diet discussed and encouraged.   Lipid Panel  Lab Results  Component Value Date   CHOL 110* 11/25/2014   HDL 38* 11/25/2014   LDLCALC 51 11/25/2014   TRIG 107 11/25/2014   CHOLHDL 2.9 11/25/2014   Updated lab needed at/ before next visit.      Paraganglioma Followed at [facility] annually will refer at next visit, due in Summer

## 2015-07-05 ENCOUNTER — Other Ambulatory Visit: Payer: Self-pay | Admitting: Cardiology

## 2015-07-05 ENCOUNTER — Other Ambulatory Visit: Payer: Self-pay | Admitting: "Endocrinology

## 2015-07-05 ENCOUNTER — Other Ambulatory Visit: Payer: Self-pay | Admitting: Family Medicine

## 2015-07-07 ENCOUNTER — Other Ambulatory Visit: Payer: Self-pay

## 2015-07-07 DIAGNOSIS — M549 Dorsalgia, unspecified: Secondary | ICD-10-CM | POA: Diagnosis not present

## 2015-07-07 DIAGNOSIS — M545 Low back pain: Secondary | ICD-10-CM | POA: Diagnosis not present

## 2015-07-07 DIAGNOSIS — M5442 Lumbago with sciatica, left side: Secondary | ICD-10-CM | POA: Diagnosis not present

## 2015-07-07 DIAGNOSIS — R262 Difficulty in walking, not elsewhere classified: Secondary | ICD-10-CM | POA: Diagnosis not present

## 2015-07-07 MED ORDER — GLUCOSE BLOOD VI STRP: ORAL_STRIP | Status: DC

## 2015-07-08 ENCOUNTER — Ambulatory Visit (INDEPENDENT_AMBULATORY_CARE_PROVIDER_SITE_OTHER): Payer: Commercial Managed Care - HMO | Admitting: Podiatry

## 2015-07-08 ENCOUNTER — Encounter: Payer: Self-pay | Admitting: Podiatry

## 2015-07-08 DIAGNOSIS — B351 Tinea unguium: Secondary | ICD-10-CM | POA: Diagnosis not present

## 2015-07-08 DIAGNOSIS — M79676 Pain in unspecified toe(s): Secondary | ICD-10-CM

## 2015-07-08 NOTE — Progress Notes (Signed)
Patient ID: Christine Dominguez, female   DOB: Dec 23, 1950, 65 y.o.   MRN: [id number]  Subjective: This patient presents complaining of painful toenails and requesting nail debridement  Objective: Orientated 3 No open skin lesions bilaterally DP and PT pulses 1/4 bilaterally Capillary reflex immediate bilaterally Sensation to 10 g monofilament wire intact 5/5 bilaterally Vibratory sensation intact bilaterally Ankle reflex equal and reactive bilaterally Plantar callus fifth MPJ right HAV right Hammertoe third right The toenails are hypertrophic, elongated, incurvated, discolored and tender direct palpation 6-10 No open skin lesions bilaterally  Assessment: Insulin-dependent diabetic Decrease pedal pulses bilaterally Protective sensation intact bilaterally Symptomatic onychomycoses 6-10  Plan: Debridement toenails 10 mechanically an electrical without any  bleeding  Reappoint 3 months

## 2015-07-08 NOTE — Patient Instructions (Signed)
Diabetes and Foot Care Diabetes may cause you to have problems because of poor blood supply (circulation) to your feet and legs. This may cause the skin on your feet to become thinner, break easier, and heal more slowly. Your skin may become dry, and the skin may peel and crack. You may also have nerve damage in your legs and feet causing decreased feeling in them. You may not notice minor injuries to your feet that could lead to infections or more serious problems. Taking care of your feet is one of the most important things you can do for yourself.  HOME CARE INSTRUCTIONS  Wear shoes at all times, even in the house. Do not go barefoot. Bare feet are easily injured.  Check your feet daily for blisters, cuts, and redness. If you cannot see the bottom of your feet, use a mirror or ask someone for help.  Wash your feet with warm water (do not use hot water) and mild soap. Then pat your feet and the areas between your toes until they are completely dry. Do not soak your feet as this can dry your skin.  Apply a moisturizing lotion or petroleum jelly (that does not contain alcohol and is unscented) to the skin on your feet and to dry, brittle toenails. Do not apply lotion between your toes.  Trim your toenails straight across. Do not dig under them or around the cuticle. File the edges of your nails with an emery board or nail file.  Do not cut corns or calluses or try to remove them with medicine.  Wear clean socks or stockings every day. Make sure they are not too tight. Do not wear knee-high stockings since they may decrease blood flow to your legs.  Wear shoes that fit properly and have enough cushioning. To break in new shoes, wear them for just a few hours a day. This prevents you from injuring your feet. Always look in your shoes before you put them on to be sure there are no objects inside.  Do not cross your legs. This may decrease the blood flow to your feet.  If you find a minor scrape,  cut, or break in the skin on your feet, keep it and the skin around it clean and dry. These areas may be cleansed with mild soap and water. Do not cleanse the area with peroxide, alcohol, or iodine.  When you remove an adhesive bandage, be sure not to damage the skin around it.  If you have a wound, look at it several times a day to make sure it is healing.  Do not use heating pads or hot water bottles. They may burn your skin. If you have lost feeling in your feet or legs, you may not know it is happening until it is too late.  Make sure your health care provider performs a complete foot exam at least annually or more often if you have foot problems. Report any cuts, sores, or bruises to your health care provider immediately. SEEK MEDICAL CARE IF:   You have an injury that is not healing.  You have cuts or breaks in the skin.  You have an ingrown nail.  You notice redness on your legs or feet.  You feel burning or tingling in your legs or feet.  You have pain or cramps in your legs and feet.  Your legs or feet are numb.  Your feet always feel cold. SEEK IMMEDIATE MEDICAL CARE IF:   There is increasing redness,   swelling, or pain in or around a wound.  There is a red line that goes up your leg.  Pus is coming from a wound.  You develop a fever or as directed by your health care provider.  You notice a bad smell coming from an ulcer or wound.   This information is not intended to replace advice given to you by your health care provider. Make sure you discuss any questions you have with your health care provider.   Document Released: 03/19/2000 Document Revised: 11/22/2012 Document Reviewed: 08/29/2012 Elsevier Interactive Patient Education 2016 [employer]  

## 2015-07-09 DIAGNOSIS — M5442 Lumbago with sciatica, left side: Secondary | ICD-10-CM | POA: Diagnosis not present

## 2015-07-09 DIAGNOSIS — M549 Dorsalgia, unspecified: Secondary | ICD-10-CM | POA: Diagnosis not present

## 2015-07-09 DIAGNOSIS — M545 Low back pain: Secondary | ICD-10-CM | POA: Diagnosis not present

## 2015-07-09 DIAGNOSIS — R262 Difficulty in walking, not elsewhere classified: Secondary | ICD-10-CM | POA: Diagnosis not present

## 2015-07-11 DIAGNOSIS — M545 Low back pain: Secondary | ICD-10-CM | POA: Diagnosis not present

## 2015-07-11 DIAGNOSIS — M549 Dorsalgia, unspecified: Secondary | ICD-10-CM | POA: Diagnosis not present

## 2015-07-11 DIAGNOSIS — M5442 Lumbago with sciatica, left side: Secondary | ICD-10-CM | POA: Diagnosis not present

## 2015-07-11 DIAGNOSIS — R262 Difficulty in walking, not elsewhere classified: Secondary | ICD-10-CM | POA: Diagnosis not present

## 2015-07-16 ENCOUNTER — Encounter: Payer: Self-pay | Admitting: Cardiology

## 2015-07-16 ENCOUNTER — Ambulatory Visit (INDEPENDENT_AMBULATORY_CARE_PROVIDER_SITE_OTHER): Payer: Commercial Managed Care - HMO | Admitting: Cardiology

## 2015-07-16 VITALS — BP 142/82 | HR 86 | Ht 61.0 in | Wt 171.0 lb

## 2015-07-16 DIAGNOSIS — E785 Hyperlipidemia, unspecified: Secondary | ICD-10-CM

## 2015-07-16 DIAGNOSIS — I10 Essential (primary) hypertension: Secondary | ICD-10-CM

## 2015-07-16 NOTE — Patient Instructions (Signed)
Your physician wants you to follow-up in: 6 months You will receive a reminder letter in the mail two months in advance. If you don't receive a letter, please call our office to schedule the follow-up appointment.  Your physician recommends that you continue on your current medications as directed. Please refer to the Current Medication list given to you today.    If you need a refill on your cardiac medications before your next appointment, please call your pharmacy.    ,Thank you for choosing [facility] !

## 2015-07-16 NOTE — Progress Notes (Signed)
Cardiology Office Note  Date: 07/16/2015   ID: Christine Dominguez, DOB 20-Jun-1950, MRN [id number]  PCP: Tula Nakayama, MD  Primary Cardiologist: [name], MD   Chief Complaint  Patient presents with  . Hypertension    History of Present Illness: Christine Dominguez is a 65 y.o. female last seen in September 2016. She presents for a routine follow-up visit. She does not report any chest pain or unusual shortness of breath. Currently undergoing physical therapy for treatment of back pain.  I reviewed her medications which are outlined below. Antihypertensive regimen includes Catapres, HCTZ, and Diovan. Blood pressure is reasonable today. She continues to follow with Dr. Moshe Cipro. I reviewed her most recent lab work which is outlined below.  Past Medical History  Diagnosis Date  . Chronic back pain 2007    Disabled   . Obesity   . Hydrocephalus     Treated with VP shunt and then intracranial tumor resection (?Cholesteatoma); no longer followed actively by neurosurgery or otolaryngology  . Type 2 diabetes mellitus ([place]) 1991  . Essential hypertension, benign 1980  . GERD (gastroesophageal reflux disease) 2000  . Hypothyroidism 2013  . Hyperlipidemia 2013  . Arthritis     Cervical spine  . Neuropathy ([place])   . Anemia   . Cancer ([place]) 1998, recurred in 2012    Paraganglioma of jugular tympamicum, recurrent in 2012    Current Outpatient Prescriptions  Medication Sig Dispense Refill  . Alcohol Swabs (B-D SINGLE USE SWABS REGULAR) PADS TEST FOUR TIMES A DAY 400 each 2  . aspirin EC 81 MG tablet Take 81 mg by mouth at bedtime.    . clonazePAM (KLONOPIN) 0.5 MG tablet Take 0.5 tablets (0.25 mg total) by mouth 2 (two) times daily as needed for anxiety. 30 tablet 0  . cloNIDine (CATAPRES) 0.1 MG tablet Take 1 tablet (0.1 mg total) by mouth 2 (two) times daily. 180 tablet 1  . docusate sodium (STOOL SOFTENER) 100 MG capsule Take 100 mg by mouth as needed for mild  constipation.    . gabapentin (NEURONTIN) 100 MG capsule TAKE 1 CAPSULE TWICE DAILY 180 capsule 1  . glucose (SUNMARK GLUCOSE) 4 GM chewable tablet Chew by mouth as needed.     Marland Kitchen glucose blood (ACCU-CHEK SMARTVIEW) test strip TEST 4 TIMES DAILY. e11.65 450 each 0  . hydrochlorothiazide (HYDRODIURIL) 12.5 MG tablet TAKE 1 TABLET EVERY DAY 90 tablet 3  . insulin glargine (LANTUS SOLOSTAR) 100 UNIT/ML injection Inject 35 Units into the skin at bedtime.    . Iron 66 MG TABS Take 1 tablet by mouth daily.     Marland Kitchen levothyroxine (SYNTHROID, LEVOTHROID) 75 MCG tablet TAKE 1 TABLET EVERY DAY BEFORE BREAKFAST 90 tablet 1  . linagliptin (TRADJENTA) 5 MG TABS tablet Take 1 tablet (5 mg total) by mouth daily. 90 tablet 0  . nystatin (MYCOSTATIN) powder APPLY TOPICALLY AT BEDTIME TO RASH ON LOWER ABDOMEN AS NEEDED 120 g 1  . omeprazole (PRILOSEC) 40 MG capsule TAKE 1 CAPSULE ONE TIME DAILY 90 capsule 1  . ondansetron (ZOFRAN) 4 MG tablet Take 1 tablet (4 mg total) by mouth every 6 (six) hours. 12 tablet 0  . Simethicone (GAS RELIEF DROPS PO) Take by mouth as needed.    . simvastatin (ZOCOR) 20 MG tablet Take 1 tablet (20 mg total) by mouth at bedtime. 90 tablet 1  . sodium chloride (OCEAN) 0.65 % nasal spray 1 spray as needed.     Marland Kitchen tiZANidine (ZANAFLEX)  2 MG tablet Take 1 tablet (2 mg total) by mouth daily as needed for muscle spasms. 30 tablet 1  . valsartan (DIOVAN) 320 MG tablet TAKE 1 TABLET EVERY DAY (DOSE INCREASE) 90 tablet 1  . Vitamin D, Ergocalciferol, (DRISDOL) 50000 UNITS CAPS capsule TAKE 1 CAPSULE EVERY 7 DAYS 12 capsule 1   No current facility-administered medications for this visit.   Allergies:  Amaryl; Avandia; Glimepiride; Glipizide; Invokana; Lyrica; Novolog; Rosiglitazone; Sitagliptin; and Aspirin   Social History: The patient  reports that she quit smoking about 14 years ago. Her smoking use included Cigarettes. She has a 7.5 pack-year smoking history. She has never used smokeless  tobacco. She reports that she does not drink alcohol or use illicit drugs.   ROS:  Please see the history of present illness. Otherwise, complete review of systems is positive for mild leg swelling.  All other systems are reviewed and negative.   Physical Exam: VS:  BP 142/82 mmHg  Pulse 86  Ht 5\' 1"  (1.549 m)  Wt 171 lb (77.565 kg)  BMI 32.33 kg/m2  SpO2 97%, BMI Body mass index is 32.33 kg/(m^2).  Wt Readings from Last 3 Encounters:  07/16/15 171 lb (77.565 kg)  07/03/15 172 lb (78.019 kg)  07/02/15 167 lb (75.751 kg)    Patient appears comfortable at rest.  HEENT: Conjunctiva normal, oropharynx clear with moist mucosa.  Neck: Supple, no carotid bruits, VP shunt on left side, no thyromegaly.  Lungs: Clear to auscultation, nonlabored breathing at rest.  Cardiac: Regular rate and rhythm, no S3 soft, systolic murmur, no pericardial rub.  Abdomen: Soft, nontender, bowel sounds present, no guarding or rebound.  Extremities: Trace leg edema, distal pulses 2+.   ECG: I personally reviewed the prior tracing from 06/16/2015 which showed normal sinus rhythm.  Recent Labwork: 05/29/2015: TSH 0.58 07/02/2015: ALT 18; AST 14*; BUN 15; Creatinine, Ser 1.32*; Hemoglobin 10.6*; Platelets 218; Potassium 4.1; Sodium 132*     Component Value Date/Time   CHOL 110* 11/25/2014 0706   TRIG 107 11/25/2014 0706   HDL 38* 11/25/2014 0706   CHOLHDL 2.9 11/25/2014 0706   VLDL 21 11/25/2014 0706   LDLCALC 51 11/25/2014 0706    Other Studies Reviewed Today:  Chest x-ray 06/16/2015: FINDINGS: The lungs are well-aerated and clear. There is no evidence of focal opacification, pleural effusion or pneumothorax.  The heart is borderline normal in size. No acute osseous abnormalities are seen.  IMPRESSION: No acute cardiopulmonary process seen.  Assessment and Plan:  1. Essential hypertension. Plan to continue current medical regimen, blood pressure control is reasonable today.  2.  Hyperlipidemia, continues on Zocor. LDL has been in the 50s.  Current medicines were reviewed with the patient today.  Disposition: FU with me in 6 months.   Signed, Satira Sark, MD, [facility] 07/16/2015 9:44 AM    [place] [facility] [address], [place], [place] [address] Phone: [phone]; Fax: [phone]

## 2015-07-20 ENCOUNTER — Encounter: Payer: Self-pay | Admitting: Family Medicine

## 2015-07-20 DIAGNOSIS — Z09 Encounter for follow-up examination after completed treatment for conditions other than malignant neoplasm: Secondary | ICD-10-CM | POA: Insufficient documentation

## 2015-07-20 NOTE — Assessment & Plan Note (Signed)
Hyperlipidemia:Low fat diet discussed and encouraged.   Lipid Panel  Lab Results  Component Value Date   CHOL 110* 11/25/2014   HDL 38* 11/25/2014   LDLCALC 51 11/25/2014   TRIG 107 11/25/2014   CHOLHDL 2.9 11/25/2014   Updated lab needed at/ before next visit.

## 2015-07-20 NOTE — Assessment & Plan Note (Signed)
Uncontrolled increase dose clonidine f/u in 5 weeks DASH diet and commitment to daily physical activity for a minimum of 30 minutes discussed and encouraged, as a part of hypertension management. The importance of attaining a healthy weight is also discussed.  BP/Weight 07/16/2015 07/03/2015 07/02/2015 06/17/2015 06/16/2015 A999333 [id number]  Systolic BP A999333 0000000 123XX123 [id number] - Q000111Q Q000111Q  Diastolic BP 82 98 83 63 - 70 64  Wt. (Lbs) 171 172 167 - 167 - -  BMI 32.33 31.98 31.57 - 31.05 - -

## 2015-07-20 NOTE — Assessment & Plan Note (Signed)
Three [facility] visits in March 2017 with GI symptoms, most recent 3/29 and initial was 0308. Reports gradual symptom improvement, main complaint of weakness and fatigue, was dehydrated, F/u labs needed in early May. Pt already has symptomatic meds at home

## 2015-07-20 NOTE — Assessment & Plan Note (Signed)
Followed at [facility] annually will refer at next visit, due in Summer

## 2015-07-20 NOTE — Assessment & Plan Note (Signed)
Christine Dominguez is reminded of the importance of commitment to daily physical activity for 30 minutes or more, as able and the need to limit carbohydrate intake to 30 to 60 grams per meal to help with blood sugar control.   The need to take medication as prescribed, test blood sugar as directed, and to call between visits if there is a concern that blood sugar is uncontrolled is also discussed.   Christine Dominguez is reminded of the importance of daily foot exam, annual eye examination, and good blood sugar, blood pressure and cholesterol control. Uncontrolled managed by endo  Diabetic Labs Latest Ref Rng 07/02/2015 06/17/2015 06/07/2015 05/29/2015 02/25/2015  HbA1c <5.7 % - - - 8.6(H) 8.7(A)  Microalbumin 0.00 - 1.89 mg/dL - - - - -  Micro/Creat Ratio 0.0 - 30.0 mg/g - - - - -  Chol 125 - 200 mg/dL - - - - -  HDL >=46 mg/dL - - - - -  Calc LDL <130 mg/dL - - - - -  Triglycerides <150 mg/dL - - - - -  Creatinine 0.44 - 1.00 mg/dL 1.32(H) 1.37(H) 1.49(H) 1.52(H) -   BP/Weight 07/16/2015 07/03/2015 07/02/2015 06/17/2015 06/16/2015 A999333 [id number]  Systolic BP A999333 0000000 123XX123 [id number] - Q000111Q Q000111Q  Diastolic BP 82 98 83 63 - 70 64  Wt. (Lbs) 171 172 167 - 167 - -  BMI 32.33 31.98 31.57 - 31.05 - -   Foot/eye exam completion dates Latest Ref Rng 11/19/2014 12/12/2013  Eye Exam No Retinopathy No Retinopathy -  Foot exam Order - - -  Foot Form Completion - - Done

## 2015-07-22 ENCOUNTER — Other Ambulatory Visit: Payer: Self-pay | Admitting: "Endocrinology

## 2015-08-06 DIAGNOSIS — E559 Vitamin D deficiency, unspecified: Secondary | ICD-10-CM | POA: Diagnosis not present

## 2015-08-06 DIAGNOSIS — R7989 Other specified abnormal findings of blood chemistry: Secondary | ICD-10-CM | POA: Diagnosis not present

## 2015-08-06 DIAGNOSIS — D509 Iron deficiency anemia, unspecified: Secondary | ICD-10-CM | POA: Diagnosis not present

## 2015-08-06 DIAGNOSIS — I10 Essential (primary) hypertension: Secondary | ICD-10-CM | POA: Diagnosis not present

## 2015-08-07 LAB — CBC
HEMATOCRIT: 34.7 % — AB (ref 35.0–45.0)
HEMOGLOBIN: 10.9 g/dL — AB (ref 11.7–15.5)
MCH: 26.3 pg — ABNORMAL LOW (ref 27.0–33.0)
MCHC: 31.4 g/dL — AB (ref 32.0–36.0)
MCV: 83.6 fL (ref 80.0–100.0)
MPV: 11.5 fL (ref 7.5–12.5)
Platelets: 258 10*3/uL (ref 140–400)
RBC: 4.15 MIL/uL (ref 3.80–5.10)
RDW: 13.9 % (ref 11.0–15.0)
WBC: 8.9 10*3/uL (ref 3.8–10.8)

## 2015-08-07 LAB — BASIC METABOLIC PANEL WITH GFR
BUN: 13 mg/dL (ref 7–25)
CHLORIDE: 103 mmol/L (ref 98–110)
CO2: 27 mmol/L (ref 20–31)
CREATININE: 1.25 mg/dL — AB (ref 0.50–0.99)
Calcium: 8.9 mg/dL (ref 8.6–10.4)
GFR, Est African American: 53 mL/min — ABNORMAL LOW (ref >=60)
GFR, Est Non African American: 46 mL/min — ABNORMAL LOW (ref >=60)
GLUCOSE: 196 mg/dL — AB (ref 65–99)
POTASSIUM: 3.8 mmol/L (ref 3.5–5.3)
Sodium: 138 mmol/L (ref 135–146)

## 2015-08-07 LAB — IRON AND TIBC
%SAT: 18 % (ref 11–50)
Iron: 37 ug/dL — ABNORMAL LOW (ref 45–160)
TIBC: 205 ug/dL — AB (ref 250–450)
UIBC: 168 ug/dL (ref 125–400)

## 2015-08-07 LAB — VITAMIN D 25 HYDROXY (VIT D DEFICIENCY, FRACTURES): Vit D, 25-Hydroxy: 67 ng/mL (ref 30–100)

## 2015-08-07 LAB — FOLATE: Folate: 10.1 ng/mL (ref >5.4)

## 2015-08-07 LAB — FERRITIN: Ferritin: 516 ng/mL — ABNORMAL HIGH (ref 20–288)

## 2015-08-07 LAB — VITAMIN B12: VITAMIN B 12: 974 pg/mL (ref 200–1100)

## 2015-08-09 ENCOUNTER — Other Ambulatory Visit: Payer: Self-pay | Admitting: "Endocrinology

## 2015-08-09 ENCOUNTER — Other Ambulatory Visit: Payer: Self-pay | Admitting: Family Medicine

## 2015-08-11 ENCOUNTER — Other Ambulatory Visit: Payer: Self-pay | Admitting: Physician Assistant

## 2015-08-11 ENCOUNTER — Other Ambulatory Visit: Payer: Self-pay | Admitting: "Endocrinology

## 2015-08-11 ENCOUNTER — Other Ambulatory Visit: Payer: Self-pay | Admitting: Family Medicine

## 2015-08-11 DIAGNOSIS — Z1231 Encounter for screening mammogram for malignant neoplasm of breast: Secondary | ICD-10-CM

## 2015-08-12 ENCOUNTER — Ambulatory Visit (INDEPENDENT_AMBULATORY_CARE_PROVIDER_SITE_OTHER): Payer: Commercial Managed Care - HMO | Admitting: Family Medicine

## 2015-08-12 ENCOUNTER — Encounter: Payer: Self-pay | Admitting: Family Medicine

## 2015-08-12 VITALS — BP 142/82 | HR 95 | Resp 16 | Ht 61.0 in | Wt 170.0 lb

## 2015-08-12 DIAGNOSIS — E1122 Type 2 diabetes mellitus with diabetic chronic kidney disease: Secondary | ICD-10-CM | POA: Diagnosis not present

## 2015-08-12 DIAGNOSIS — Z0000 Encounter for general adult medical examination without abnormal findings: Secondary | ICD-10-CM | POA: Insufficient documentation

## 2015-08-12 DIAGNOSIS — I10 Essential (primary) hypertension: Secondary | ICD-10-CM | POA: Diagnosis not present

## 2015-08-12 DIAGNOSIS — N183 Chronic kidney disease, stage 3 unspecified: Secondary | ICD-10-CM

## 2015-08-12 DIAGNOSIS — E1129 Type 2 diabetes mellitus with other diabetic kidney complication: Secondary | ICD-10-CM | POA: Diagnosis not present

## 2015-08-12 DIAGNOSIS — E785 Hyperlipidemia, unspecified: Secondary | ICD-10-CM

## 2015-08-12 DIAGNOSIS — Z23 Encounter for immunization: Secondary | ICD-10-CM | POA: Insufficient documentation

## 2015-08-12 NOTE — Assessment & Plan Note (Signed)
Improved significantly, no med change DASH diet and commitment to daily physical activity for a minimum of 30 minutes discussed and encouraged, as a part of hypertension management. The importance of attaining a healthy weight is also discussed.  BP/Weight 08/12/2015 07/16/2015 07/03/2015 07/02/2015 06/17/2015 0000000 [id number]  Systolic BP A999333 A999333 0000000 [id number] [id number] - Q000111Q  Diastolic BP 82 82 98 83 63 - 70  Wt. (Lbs) 170 171 172 167 - 167 -  BMI 32.14 32.33 31.98 31.57 - 31.05 -

## 2015-08-12 NOTE — Assessment & Plan Note (Signed)

## 2015-08-12 NOTE — Assessment & Plan Note (Signed)
After obtaining informed consent, the vaccine is  administered by LPN.  

## 2015-08-12 NOTE — Progress Notes (Signed)
Subjective:    Patient ID: Christine Dominguez, female    DOB: 09/07/1950, 65 y.o.   MRN: [id number]  HPI Preventive Screening-Counseling & Management   Patient present here today for a Medicare annual wellness visit.   Current Problems (verified)   Medications Prior to Visit Allergies (verified)   PAST HISTORY  Family History (verified)   Social History widow x 3 years, 3 sons all deceased. Disabled since age 54 from back pain    Risk Factors  Current exercise habits: walks a few days a week and does back exercises from sheet she got from the office    Dietary issues discussed: heart healthy diet, cutting carbs and fried foods    Cardiac risk factors: type 2 dm   Depression Screen  (Note: if answer to either of the following is "Yes", a more complete depression screening is indicated)   Over the past two weeks, have you felt down, depressed or hopeless? No  Over the past two weeks, have you felt little interest or pleasure in doing things? No  Have you lost interest or pleasure in daily life? No  Do you often feel hopeless? No  Do you cry easily over simple problems? No   Activities of Daily Living  In your present state of health, do you have any difficulty performing the following activities?  Driving?: No Managing money?: No Feeding yourself?:No Getting from bed to chair?:No Climbing a flight of stairs?: limited due to her back  Preparing food and eating?:No Bathing or showering?:No Getting dressed?:No Getting to the toilet?:No Using the toilet?:No Moving around from place to place?: No  Fall Risk Assessment In the past year have you fallen or had a near fall?:No Are you currently taking any medications that make you dizzy?:No   Hearing Difficulties: No Do you often ask people to speak up or repeat themselves?: hearing problems in right ear  Do you experience ringing or noises in your ears?:No Do you have difficulty understanding soft or whispered  voices?: yes   Cognitive Testing  Alert? Yes Normal Appearance?Yes  Oriented to person? Yes Place? Yes  Time? Yes  Displays appropriate judgment?Yes  Can read the correct time from a watch face? yes Are you having problems remembering things?No  Advanced Directives have been discussed with the patient?Yes , full code, HPOA, are her brother, Christine Dominguez and son, Christine Dominguez   List the Names of Other Physician/Practitioners you currently use: updated    Indicate any recent Medical Services you may have received from other than [facility] providers in the past year (date may be approximate).   Assessment:    Annual Wellness Exam   Plan:    Patient Instructions (the written plan) was given to the patient.  Medicare Attestation  I have personally reviewed:  The patient's medical and social history  Their use of alcohol, tobacco or illicit drugs  Their current medications and supplements  The patient's functional ability including ADLs,fall risks, home safety risks, cognitive, and hearing and visual impairment  Diet and physical activities  Evidence for depression or mood disorders  The patient's weight, height, BMI, and visual acuity have been recorded in the chart. I have made referrals, counseling, and provided education to the patient based on review of the above and I have provided the patient with a written personalized care plan for preventive services.      Review of Systems     Objective:   Physical Exam  BP 142/82 mmHg  Pulse  95  Resp 16  Ht 5\' 1"  (1.549 m)  Wt 170 lb (77.111 kg)  BMI 32.14 kg/m2  SpO2 95%      Assessment & Plan:  Medicare annual wellness visit, subsequent Annual exam as documented. Counseling done  re healthy lifestyle involving commitment to 150 minutes exercise per week, heart healthy diet, and attaining healthy weight.The importance of adequate sleep also discussed. Regular seat belt use and home safety, is also discussed. Changes  in health habits are decided on by the patient with goals and time frames  set for achieving them. Immunization and cancer screening needs are specifically addressed at this visit.   Essential hypertension, benign Improved significantly, no med change DASH diet and commitment to daily physical activity for a minimum of 30 minutes discussed and encouraged, as a part of hypertension management. The importance of attaining a healthy weight is also discussed.  BP/Weight 08/12/2015 07/16/2015 07/03/2015 07/02/2015 06/17/2015 0000000 [id number]  Systolic BP A999333 A999333 0000000 123XX123 123XX123 - Q000111Q  Diastolic BP 82 82 98 83 63 - 70  Wt. (Lbs) 170 171 172 167 - 167 -  BMI 32.14 32.33 31.98 31.57 - 31.05 -        Need for 23-polyvalent pneumococcal polysaccharide vaccine After obtaining informed consent, the vaccine is  administered by LPN.

## 2015-08-12 NOTE — Addendum Note (Signed)
Addended by: Eual Fines on: 08/12/2015 08:54 AM   Modules accepted: Orders

## 2015-08-12 NOTE — Patient Instructions (Signed)
F/u in 4.5 month, with [facility], call if you need me sooner  Improved blood pressure and kidney function  STOP klonopin, reduce iron to one daily  Fasting lipid, cmp and EGFR in 4.5 months  Microalb and pneumonia 23 today   It is important that you exercise regularly at least 30 minutes 5 times a week. If you develop chest pain, have severe difficulty breathing, or feel very tired, stop exercising immediately and seek medical attention   Keep brain active to improve memory!     Fall Prevention in the Home  Falls can cause injuries. They can happen to people of all ages. There are many things you can do to make your home safe and to help prevent falls.  WHAT CAN I DO ON THE OUTSIDE OF MY HOME?  Regularly fix the edges of walkways and driveways and fix any cracks.  Remove anything that might make you trip as you walk through a door, such as a raised step or threshold.  Trim any bushes or trees on the path to your home.  Use bright outdoor lighting.  Clear any walking paths of anything that might make someone trip, such as rocks or tools.  Regularly check to see if handrails are loose or broken. Make sure that both sides of any steps have handrails.  Any raised decks and porches should have guardrails on the edges.  Have any leaves, snow, or ice cleared regularly.  Use sand or salt on walking paths during winter.  Clean up any spills in your garage right away. This includes oil or grease spills. WHAT CAN I DO IN THE BATHROOM?   Use night lights.  Install grab bars by the toilet and in the tub and shower. Do not use towel bars as grab bars.  Use non-skid mats or decals in the tub or shower.  If you need to sit down in the shower, use a plastic, non-slip stool.  Keep the floor dry. Clean up any water that spills on the floor as soon as it happens.  Remove soap buildup in the tub or shower regularly.  Attach bath mats securely with double-sided non-slip rug tape.  Do  not have throw rugs and other things on the floor that can make you trip. WHAT CAN I DO IN THE BEDROOM?  Use night lights.  Make sure that you have a light by your bed that is easy to reach.  Do not use any sheets or blankets that are too big for your bed. They should not hang down onto the floor.  Have a firm chair that has side arms. You can use this for support while you get dressed.  Do not have throw rugs and other things on the floor that can make you trip. WHAT CAN I DO IN THE KITCHEN?  Clean up any spills right away.  Avoid walking on wet floors.  Keep items that you use a lot in easy-to-reach places.  If you need to reach something above you, use a strong step stool that has a grab bar.  Keep electrical cords out of the way.  Do not use floor polish or wax that makes floors slippery. If you must use wax, use non-skid floor wax.  Do not have throw rugs and other things on the floor that can make you trip. WHAT CAN I DO WITH MY STAIRS?  Do not leave any items on the stairs.  Make sure that there are handrails on both sides of the stairs  and use them. Fix handrails that are broken or loose. Make sure that handrails are as long as the stairways.  Check any carpeting to make sure that it is firmly attached to the stairs. Fix any carpet that is loose or worn.  Avoid having throw rugs at the top or bottom of the stairs. If you do have throw rugs, attach them to the floor with carpet tape.  Make sure that you have a light switch at the top of the stairs and the bottom of the stairs. If you do not have them, ask someone to add them for you. WHAT ELSE CAN I DO TO HELP PREVENT FALLS?  Wear shoes that:  Do not have high heels.  Have rubber bottoms.  Are comfortable and fit you well.  Are closed at the toe. Do not wear sandals.  If you use a stepladder:  Make sure that it is fully opened. Do not climb a closed stepladder.  Make sure that both sides of the stepladder  are locked into place.  Ask someone to hold it for you, if possible.  Clearly mark and make sure that you can see:  Any grab bars or handrails.  First and last steps.  Where the edge of each step is.  Use tools that help you move around (mobility aids) if they are needed. These include:  Canes.  Walkers.  Scooters.  Crutches.  Turn on the lights when you go into a dark area. Replace any light bulbs as soon as they burn out.  Set up your furniture so you have a clear path. Avoid moving your furniture around.  If any of your floors are uneven, fix them.  If there are any pets around you, be aware of where they are.  Review your medicines with your doctor. Some medicines can make you feel dizzy. This can increase your chance of falling. Ask your doctor what other things that you can do to help prevent falls.   This information is not intended to replace advice given to you by your health care provider. Make sure you discuss any questions you have with your health care provider.   Document Released: 01/16/2009 Document Revised: 08/06/2014 Document Reviewed: 04/26/2014 Elsevier Interactive Patient Education [employer].

## 2015-08-13 LAB — MICROALBUMIN / CREATININE URINE RATIO
Creatinine, Urine: 117 mg/dL (ref 20–320)
MICROALB/CREAT RATIO: 11 ug/mg{creat} (ref <30)
Microalb, Ur: 1.3 mg/dL

## 2015-08-15 ENCOUNTER — Other Ambulatory Visit: Payer: Self-pay

## 2015-08-15 MED ORDER — LEVOTHYROXINE SODIUM 75 MCG PO TABS: ORAL_TABLET | ORAL | Status: DC

## 2015-08-15 MED ORDER — NYSTATIN 100000 UNIT/GM EX POWD: CUTANEOUS | Status: DC

## 2015-08-29 ENCOUNTER — Telehealth: Payer: Self-pay | Admitting: Family Medicine

## 2015-08-29 MED ORDER — VITAMIN D (ERGOCALCIFEROL) 1.25 MG (50000 UNIT) PO CAPS: ORAL_CAPSULE | ORAL | Status: DC

## 2015-08-29 NOTE — Telephone Encounter (Signed)
Med refilled.

## 2015-08-29 NOTE — Telephone Encounter (Signed)
Patient is requesting a refill on her Vitamin D, Ergocalciferol, (DRISDOL) 50000 UNITS CAPS capsule called to [facility] , please advise?

## 2015-09-01 ENCOUNTER — Other Ambulatory Visit: Payer: Self-pay | Admitting: "Endocrinology

## 2015-09-04 ENCOUNTER — Other Ambulatory Visit: Payer: Self-pay | Admitting: "Endocrinology

## 2015-09-04 DIAGNOSIS — N183 Chronic kidney disease, stage 3 (moderate): Secondary | ICD-10-CM | POA: Diagnosis not present

## 2015-09-04 DIAGNOSIS — E1122 Type 2 diabetes mellitus with diabetic chronic kidney disease: Secondary | ICD-10-CM | POA: Diagnosis not present

## 2015-09-04 LAB — BASIC METABOLIC PANEL
BUN: 16 mg/dL (ref 7–25)
CHLORIDE: 103 mmol/L (ref 98–110)
CO2: 26 mmol/L (ref 20–31)
Calcium: 8.9 mg/dL (ref 8.6–10.4)
Creat: 1.32 mg/dL — ABNORMAL HIGH (ref 0.50–0.99)
Glucose, Bld: 183 mg/dL — ABNORMAL HIGH (ref 65–99)
POTASSIUM: 3.6 mmol/L (ref 3.5–5.3)
Sodium: 139 mmol/L (ref 135–146)

## 2015-09-04 LAB — HEMOGLOBIN A1C
Hgb A1c MFr Bld: 8.6 % — ABNORMAL HIGH (ref <5.7)
MEAN PLASMA GLUCOSE: 200 mg/dL

## 2015-09-04 LAB — TSH: TSH: 0.12 m[IU]/L — AB

## 2015-09-04 LAB — T4, FREE: FREE T4: 1.1 ng/dL (ref 0.8–1.8)

## 2015-09-11 ENCOUNTER — Ambulatory Visit (INDEPENDENT_AMBULATORY_CARE_PROVIDER_SITE_OTHER): Payer: Commercial Managed Care - HMO | Admitting: "Endocrinology

## 2015-09-11 ENCOUNTER — Encounter: Payer: Self-pay | Admitting: "Endocrinology

## 2015-09-11 VITALS — BP 164/96 | HR 90 | Ht 61.0 in | Wt 168.0 lb

## 2015-09-11 DIAGNOSIS — N183 Chronic kidney disease, stage 3 unspecified: Secondary | ICD-10-CM

## 2015-09-11 DIAGNOSIS — E785 Hyperlipidemia, unspecified: Secondary | ICD-10-CM | POA: Diagnosis not present

## 2015-09-11 DIAGNOSIS — I10 Essential (primary) hypertension: Secondary | ICD-10-CM | POA: Diagnosis not present

## 2015-09-11 DIAGNOSIS — E1122 Type 2 diabetes mellitus with diabetic chronic kidney disease: Secondary | ICD-10-CM | POA: Diagnosis not present

## 2015-09-11 DIAGNOSIS — E039 Hypothyroidism, unspecified: Secondary | ICD-10-CM | POA: Diagnosis not present

## 2015-09-11 NOTE — Progress Notes (Signed)
Subjective:    Patient ID: Christine Dominguez, female    DOB: [birth year], PCP Tula Nakayama, MD   Past Medical History  Diagnosis Date  . Chronic back pain 2007    Disabled   . Obesity   . Hydrocephalus     Treated with VP shunt and then intracranial tumor resection (?Cholesteatoma); no longer followed actively by neurosurgery or otolaryngology  . Type 2 diabetes mellitus ([place]) 1991  . Essential hypertension, benign 1980  . GERD (gastroesophageal reflux disease) 2000  . Hypothyroidism 2013  . Hyperlipidemia 2013  . Arthritis     Cervical spine  . Neuropathy ([place])   . Anemia   . Cancer ([place]) 1998, recurred in 2012    Paraganglioma of jugular tympamicum, recurrent in 2012   Past Surgical History  Procedure Laterality Date  . Ventriculoperitoneal shunt  1998    At [facility]  . Intracranial mass resected  1998    At [facility]; ? location-right middle ear  . Hemorrhoidectomy    . Trigger finger release      Rght thumb  . Tubal ligation  1980  . Colonoscopy  06/2010  . Tubal ligation    . Cataract extraction w/phaco Right 07/17/2013    Procedure: CATARACT EXTRACTION PHACO AND INTRAOCULAR LENS PLACEMENT (IOC);  Surgeon: [name], MD;  Location: [facility];  Service: Ophthalmology;  Laterality: Right;  CDE:7.24  . Teeth pulled     Social History   Social History  . Marital Status: Widowed    Spouse Name: N/A  . Number of Children: 3  . Years of Education: N/A   Occupational History  . disabled     Social History Main Topics  . Smoking status: Former Smoker -- 0.50 packs/day for 15 years    Types: Cigarettes    Quit date: 07/07/2001  . Smokeless tobacco: Never Used  . Alcohol Use: No  . Drug Use: No  . Sexual Activity: No     Comment: tubal   Other Topics Concern  . None   Social History Narrative   Mother of 3 - all children are deceased         Outpatient Encounter Prescriptions as of 09/11/2015  Medication Sig  . ACCU-CHEK SMARTVIEW test strip TEST 4  TIMES DAILY  . Alcohol Swabs (B-D SINGLE USE SWABS REGULAR) PADS TEST FOUR TIMES A DAY  . aspirin EC 81 MG tablet Take 81 mg by mouth at bedtime.  . Blood Glucose Calibration (ACCU-CHEK SMARTVIEW CONTROL) LIQD USE AS DIRECTED  . cloNIDine (CATAPRES) 0.1 MG tablet Take 1 tablet (0.1 mg total) by mouth 2 (two) times daily.  Marland Kitchen docusate sodium (STOOL SOFTENER) 100 MG capsule Take 100 mg by mouth as needed for mild constipation.  . gabapentin (NEURONTIN) 100 MG capsule TAKE 1 CAPSULE TWICE DAILY  . glucose (SUNMARK GLUCOSE) 4 GM chewable tablet Chew by mouth as needed.   . hydrochlorothiazide (HYDRODIURIL) 12.5 MG tablet TAKE 1 TABLET EVERY DAY  . insulin glargine (LANTUS SOLOSTAR) 100 UNIT/ML injection Inject 40 Units into the skin every morning.  . Iron 66 MG TABS Take 1 tablet by mouth daily.   Marland Kitchen levothyroxine (SYNTHROID, LEVOTHROID) 75 MCG tablet TAKE 1 TABLET EVERY DAY BEFORE BREAKFAST  . nystatin (MYCOSTATIN) powder APPLY TOPICALLY AT BEDTIME TO RASH ON LOWER ABDOMEN AS NEEDED  . omeprazole (PRILOSEC) 40 MG capsule TAKE 1 CAPSULE ONE TIME DAILY  . Simethicone (GAS RELIEF DROPS PO) Take by mouth as needed.  Marland Kitchen  simvastatin (ZOCOR) 20 MG tablet Take 1 tablet (20 mg total) by mouth at bedtime.  . sodium chloride (OCEAN) 0.65 % nasal spray 1 spray as needed.   Marland Kitchen tiZANidine (ZANAFLEX) 2 MG tablet Take 1 tablet (2 mg total) by mouth daily as needed for muscle spasms.  . TRADJENTA 5 MG TABS tablet TAKE 1 TABLET EVERY DAY  . valsartan (DIOVAN) 320 MG tablet TAKE 1 TABLET EVERY DAY (DOSE INCREASE)  . Vitamin D, Ergocalciferol, (DRISDOL) 50000 units CAPS capsule TAKE 1 CAPSULE EVERY 7 DAYS   No facility-administered encounter medications on file as of 09/11/2015.   ALLERGIES: Allergies  Allergen Reactions  . Amaryl   . Avandia [Rosiglitazone Maleate]   . Glimepiride Itching  . Glipizide Itching  . Invokana [Canagliflozin]   . Lyrica [Pregabalin] Nausea And Vomiting  . Novolog [Insulin Aspart]    . Rosiglitazone     Other reaction(s): OTHER  . Sitagliptin     Other reaction(s): OTHER  . Aspirin Nausea Only and Other (See Comments)    Other Reaction: GI Upset More of a burning feeling when she take it Burning in stomach, nausea   VACCINATION STATUS: Immunization History  Administered Date(s) Administered  . Influenza Split 02/15/2012  . Influenza Whole 01/06/2010  . Influenza,inj,Quad PF,36+ Mos 05/14/2013, 01/17/2014, 04/03/2015  . Pneumococcal Conjugate-13 08/08/2014  . Pneumococcal Polysaccharide-23 10/13/2009, 08/12/2015  . Tdap 08/24/2005    HPI  65- yr - old female with medical problem of type 2 DM, HTN, HPL, and hypothyroidism.  She also has hx of CKD stage 3 which is stable. She is here for f/u of her type 2 DM. She denies hypoglycemia, but she worries about it . A1c is same at 8.6%, generally improved from from 9.3%. -She has exceptional fear for hypoglycemia and forces unnecessary snacks especially during the evening hours. She is compliant to her Lt4 and She is on Diovan for HTN. she remains on Tradjenta, along with Lantus   35 units daily at breakfast.    Review of Systems  Constitutional: Positive for fatigue. Negative for fever, chills and unexpected weight change.  HENT: Negative for trouble swallowing and voice change.   Eyes: Negative for visual disturbance.  Respiratory: Negative for cough, shortness of breath and wheezing.   Cardiovascular: Negative for chest pain, palpitations and leg swelling.  Gastrointestinal: Negative for nausea, vomiting and diarrhea.  Endocrine: Negative for cold intolerance, heat intolerance, polydipsia, polyphagia and polyuria.  Musculoskeletal: Negative for myalgias and arthralgias.  Skin: Negative for color change, pallor, rash and wound.  Neurological: Negative for seizures and headaches.  Psychiatric/Behavioral: Negative for suicidal ideas and confusion.    Objective:    BP 164/96 mmHg  Pulse 90  Ht 5\' 1"  (1.549  m)  Wt 168 lb (76.204 kg)  BMI 31.76 kg/m2  SpO2 95%  Wt Readings from Last 3 Encounters:  09/11/15 168 lb (76.204 kg)  08/12/15 170 lb (77.111 kg)  07/16/15 171 lb (77.565 kg)    Physical Exam  Constitutional: She is oriented to person, place, and time. She appears well-developed.  HENT:  Head: Normocephalic and atraumatic.  Eyes: EOM are normal.  Neck: Normal range of motion. Neck supple. No tracheal deviation present. No thyromegaly present.  Cardiovascular: Normal rate and regular rhythm.   Pulmonary/Chest: Effort normal and breath sounds normal.  Abdominal: Soft. Bowel sounds are normal. There is no tenderness. There is no guarding.  Musculoskeletal: Normal range of motion. She exhibits no edema.  Neurological: She is alert  and oriented to person, place, and time. She has normal reflexes. No cranial nerve deficit. Coordination normal.  Skin: Skin is warm and dry. No rash noted. No erythema. No pallor.  Psychiatric: She has a normal mood and affect. Judgment normal.    Results for orders placed or performed in visit on [id number]  Basic metabolic panel  Result Value Ref Range   Sodium 139 135 - 146 mmol/L   Potassium 3.6 3.5 - 5.3 mmol/L   Chloride 103 98 - 110 mmol/L   CO2 26 20 - 31 mmol/L   Glucose, Bld 183 (H) 65 - 99 mg/dL   BUN 16 7 - 25 mg/dL   Creat 1.32 (H) 0.50 - 0.99 mg/dL   Calcium 8.9 8.6 - 10.4 mg/dL  Hemoglobin A1c  Result Value Ref Range   Hgb A1c MFr Bld 8.6 (H) <5.7 %   Mean Plasma Glucose 200 mg/dL   Diabetic Labs (most recent): Lab Results  Component Value Date   HGBA1C 8.6* 09/04/2015   HGBA1C 8.6* 05/29/2015   HGBA1C 8.7* 02/25/2015   Lipid Panel     Component Value Date/Time   CHOL 110* 11/25/2014 0706   TRIG 107 11/25/2014 0706   HDL 38* 11/25/2014 0706   CHOLHDL 2.9 11/25/2014 0706   VLDL 21 11/25/2014 0706   LDLCALC 51 11/25/2014 0706     Assessment & Plan:   1. Diabetes mellitus with stage 3 chronic kidney disease ([place]) -She  remains at a high risk for more acute and chronic complications of diabetes which include CAD, CVA, CKD, retinopathy, and neuropathy. These are all discussed in detail with the patient.  Patient came with slightly above target glucose profile, and  recent A1c of 8.6% unchanged from last visit.   Glucose logs and insulin administration records pertaining to this visit,  to be scanned into patient's records.  Recent labs reviewed.   - I have re-counseled the patient on diet management andweight loss  by adopting a carbohydrate restricted / protein rich  Diet.  - Suggestion is made for patient to avoid simple carbohydrates   from their diet including Cakes , Desserts, Ice Cream,  Soda (  diet and regular) , Sweet Tea , Candies,  Chips, Cookies, Artificial Sweeteners,   and "Sugar-free" Products .  This will help patient to have stable blood glucose profile and potentially avoid unintended  Weight gain.  - Patient is advised to stick to a routine mealtimes to eat 3 meals  a day and avoid unnecessary snacks ( to snack only to correct hypoglycemia).  - The patient will be  scheduled with Jearld Fenton, RDN, CDE for individualized DM education.  - I have approached patient with the following individualized plan to manage diabetes and patient agrees.  She does not want to tighten control ,  due to fear of hypoglycemia.  - I will   increase Lantus to 40 units every morning with breakfast associated with monitoring her blood glucose twice a day, before breakfast and at bedtime. -Continue Tradjenta 5 mg by mouth every morning.   - She did not like Invokana .  - Patient specific target  for A1c; LDL, HDL, Triglycerides, and  Waist Circumference were discussed in detail.  2) BP/HTN: Controlled. Continue current medications including ACEI/ARB. 3) Lipids/HPL:  continue statins. 4)  Weight/Diet: CDE consult in progress, exercise, and carbohydrates information provided.  5) hypothyroidism:  her thyroid  function tests are consistent with appropriate replacement.  - I advised her to continue  levothyroxine 75 g by mouth every morning.    - We discussed about correct intake of levothyroxine, at fasting, with water, separated by at least 30 minutes from breakfast, and separated by more than 4 hours from calcium, iron, multivitamins, acid reflux medications (PPIs). -Patient is made aware of the fact that thyroid hormone replacement is needed for life, dose to be adjusted by periodic monitoring of thyroid function tests.  6) Chronic Care/Health Maintenance:  -Patient is on ACEI/ARB and Statin medications and encouraged to continue to follow up with Ophthalmology, Podiatrist at least yearly or according to recommendations, and advised to  stay away from smoking. I have recommended yearly flu vaccine and pneumonia vaccination at least every 5 years; moderate intensity exercise for up to 150 minutes weekly; and  sleep for at least 7 hours a day.  - 25 minutes of time was spent on the care of this patient , 50% of which was applied for counseling on diabetes complications and their preventions.  - I advised patient to maintain close follow up with Tula Nakayama, MD for primary care needs.  Patient is asked to bring meter and  blood glucose logs during their next visit.   Follow up plan: -Return in about 3 months (around 12/12/2015) for diabetes, high blood pressure, high cholesterol, underactive thyroid, follow up with pre-visit labs, meter, and logs.  Glade Lloyd, MD Phone: [phone]  Fax: [phone]   09/11/2015, 11:07 AM

## 2015-09-11 NOTE — Patient Instructions (Signed)

## 2015-09-13 ENCOUNTER — Other Ambulatory Visit: Payer: Self-pay | Admitting: "Endocrinology

## 2015-09-18 ENCOUNTER — Ambulatory Visit (HOSPITAL_COMMUNITY)
Admission: RE | Admit: 2015-09-18 | Discharge: 2015-09-18 | Disposition: A | Discharge status: Home / Self Care | Payer: Commercial Managed Care - HMO | Source: Ambulatory Visit | Attending: Family Medicine | Admitting: Family Medicine

## 2015-09-18 DIAGNOSIS — Z1231 Encounter for screening mammogram for malignant neoplasm of breast: Secondary | ICD-10-CM | POA: Diagnosis not present

## 2015-10-06 ENCOUNTER — Other Ambulatory Visit: Payer: Self-pay | Admitting: Family Medicine

## 2015-10-10 ENCOUNTER — Other Ambulatory Visit: Payer: Self-pay | Admitting: "Endocrinology

## 2015-10-14 ENCOUNTER — Encounter: Payer: Self-pay | Admitting: Podiatry

## 2015-10-14 ENCOUNTER — Ambulatory Visit (INDEPENDENT_AMBULATORY_CARE_PROVIDER_SITE_OTHER): Payer: Commercial Managed Care - HMO | Admitting: Podiatry

## 2015-10-14 DIAGNOSIS — B351 Tinea unguium: Secondary | ICD-10-CM

## 2015-10-14 DIAGNOSIS — M79676 Pain in unspecified toe(s): Secondary | ICD-10-CM | POA: Diagnosis not present

## 2015-10-14 NOTE — Patient Instructions (Signed)
Diabetes and Foot Care Diabetes may cause you to have problems because of poor blood supply (circulation) to your feet and legs. This may cause the skin on your feet to become thinner, break easier, and heal more slowly. Your skin may become dry, and the skin may peel and crack. You may also have nerve damage in your legs and feet causing decreased feeling in them. You may not notice minor injuries to your feet that could lead to infections or more serious problems. Taking care of your feet is one of the most important things you can do for yourself.  HOME CARE INSTRUCTIONS  Wear shoes at all times, even in the house. Do not go barefoot. Bare feet are easily injured.  Check your feet daily for blisters, cuts, and redness. If you cannot see the bottom of your feet, use a mirror or ask someone for help.  Wash your feet with warm water (do not use hot water) and mild soap. Then pat your feet and the areas between your toes until they are completely dry. Do not soak your feet as this can dry your skin.  Apply a moisturizing lotion or petroleum jelly (that does not contain alcohol and is unscented) to the skin on your feet and to dry, brittle toenails. Do not apply lotion between your toes.  Trim your toenails straight across. Do not dig under them or around the cuticle. File the edges of your nails with an emery board or nail file.  Do not cut corns or calluses or try to remove them with medicine.  Wear clean socks or stockings every day. Make sure they are not too tight. Do not wear knee-high stockings since they may decrease blood flow to your legs.  Wear shoes that fit properly and have enough cushioning. To break in new shoes, wear them for just a few hours a day. This prevents you from injuring your feet. Always look in your shoes before you put them on to be sure there are no objects inside.  Do not cross your legs. This may decrease the blood flow to your feet.  If you find a minor scrape,  cut, or break in the skin on your feet, keep it and the skin around it clean and dry. These areas may be cleansed with mild soap and water. Do not cleanse the area with peroxide, alcohol, or iodine.  When you remove an adhesive bandage, be sure not to damage the skin around it.  If you have a wound, look at it several times a day to make sure it is healing.  Do not use heating pads or hot water bottles. They may burn your skin. If you have lost feeling in your feet or legs, you may not know it is happening until it is too late.  Make sure your health care provider performs a complete foot exam at least annually or more often if you have foot problems. Report any cuts, sores, or bruises to your health care provider immediately. SEEK MEDICAL CARE IF:   You have an injury that is not healing.  You have cuts or breaks in the skin.  You have an ingrown nail.  You notice redness on your legs or feet.  You feel burning or tingling in your legs or feet.  You have pain or cramps in your legs and feet.  Your legs or feet are numb.  Your feet always feel cold. SEEK IMMEDIATE MEDICAL CARE IF:   There is increasing redness,   swelling, or pain in or around a wound.  There is a red line that goes up your leg.  Pus is coming from a wound.  You develop a fever or as directed by your health care provider.  You notice a bad smell coming from an ulcer or wound.   This information is not intended to replace advice given to you by your health care provider. Make sure you discuss any questions you have with your health care provider.   Document Released: 03/19/2000 Document Revised: 11/22/2012 Document Reviewed: 08/29/2012 Elsevier Interactive Patient Education 2016 [employer]  

## 2015-10-14 NOTE — Progress Notes (Signed)
Patient ID: Christine Dominguez, female   DOB: March 27, 1951, 65 y.o.   MRN: [id number]  Subjective: This patient presents complaining of painful toenails and requesting nail debridement  Objective: Orientated 3 No open skin lesions bilaterally DP and PT pulses 1/4 bilaterally Capillary reflex immediate bilaterally Sensation to 10 g monofilament wire intact 5/5 bilaterally Vibratory sensation intact bilaterally Ankle reflex equal and reactive bilaterally Plantar callus fifth MPJ right HAV right Hammertoe third right The toenails are hypertrophic, elongated, incurvated, discolored and tender direct palpation 6-10 No open skin lesions bilaterally  Assessment: Insulin-dependent diabetic Decrease pedal pulses bilaterally Protective sensation intact bilaterally Symptomatic onychomycoses 6-10  Plan: Debridement toenails 10 mechanically an electrical without any bleeding  Reappoint 3 months

## 2015-10-20 ENCOUNTER — Other Ambulatory Visit: Payer: Self-pay | Admitting: "Endocrinology

## 2015-10-29 ENCOUNTER — Other Ambulatory Visit: Payer: Self-pay | Admitting: "Endocrinology

## 2015-10-29 ENCOUNTER — Other Ambulatory Visit: Payer: Self-pay | Admitting: Family Medicine

## 2015-11-05 ENCOUNTER — Telehealth: Payer: Self-pay | Admitting: Family Medicine

## 2015-11-05 NOTE — Telephone Encounter (Signed)
[facility] needs a [facility] Referral sent to them. She has an eye apt. Tomorrow. E11.9  Fax number [phone]  Thank you Serena Colonel

## 2015-11-07 NOTE — Telephone Encounter (Signed)
Noted and done

## 2015-11-11 ENCOUNTER — Other Ambulatory Visit: Payer: Self-pay | Admitting: Cardiology

## 2015-11-25 DIAGNOSIS — E119 Type 2 diabetes mellitus without complications: Secondary | ICD-10-CM | POA: Diagnosis not present

## 2015-11-25 LAB — HM DIABETES EYE EXAM

## 2015-11-26 ENCOUNTER — Other Ambulatory Visit: Payer: Self-pay | Admitting: "Endocrinology

## 2015-11-26 ENCOUNTER — Other Ambulatory Visit: Payer: Self-pay | Admitting: Family Medicine

## 2015-12-02 ENCOUNTER — Other Ambulatory Visit: Payer: Self-pay | Admitting: "Endocrinology

## 2015-12-10 ENCOUNTER — Other Ambulatory Visit: Payer: Self-pay | Admitting: "Endocrinology

## 2015-12-10 DIAGNOSIS — E1122 Type 2 diabetes mellitus with diabetic chronic kidney disease: Secondary | ICD-10-CM | POA: Diagnosis not present

## 2015-12-10 DIAGNOSIS — N183 Chronic kidney disease, stage 3 (moderate): Secondary | ICD-10-CM | POA: Diagnosis not present

## 2015-12-10 LAB — COMPLETE METABOLIC PANEL WITH GFR
ALT: 12 U/L (ref 6–29)
AST: 11 U/L (ref 10–35)
Albumin: 3.8 g/dL (ref 3.6–5.1)
Alkaline Phosphatase: 66 U/L (ref 33–130)
BUN: 18 mg/dL (ref 7–25)
CALCIUM: 9.3 mg/dL (ref 8.6–10.4)
CHLORIDE: 102 mmol/L (ref 98–110)
CO2: 26 mmol/L (ref 20–31)
Creat: 1.38 mg/dL — ABNORMAL HIGH (ref 0.50–0.99)
GFR, EST AFRICAN AMERICAN: 47 mL/min — AB (ref >=60)
GFR, Est Non African American: 40 mL/min — ABNORMAL LOW (ref >=60)
Glucose, Bld: 195 mg/dL — ABNORMAL HIGH (ref 65–99)
POTASSIUM: 4.1 mmol/L (ref 3.5–5.3)
Sodium: 136 mmol/L (ref 135–146)
Total Bilirubin: 0.3 mg/dL (ref 0.2–1.2)
Total Protein: 7.5 g/dL (ref 6.1–8.1)

## 2015-12-11 ENCOUNTER — Telehealth: Payer: Self-pay | Admitting: Family Medicine

## 2015-12-11 LAB — HEMOGLOBIN A1C
HEMOGLOBIN A1C: 8.8 % — AB (ref <5.7)
MEAN PLASMA GLUCOSE: 206 mg/dL

## 2015-12-11 NOTE — Telephone Encounter (Signed)
Pt is having back pain and needs nurse to call her back

## 2015-12-12 ENCOUNTER — Telehealth: Payer: Self-pay

## 2015-12-12 ENCOUNTER — Other Ambulatory Visit: Payer: Self-pay

## 2015-12-12 MED ORDER — TIZANIDINE HCL 2 MG PO TABS: 2.0000 mg | ORAL_TABLET | Freq: Every day | ORAL | 2 refills | Status: DC | PRN

## 2015-12-12 NOTE — Telephone Encounter (Signed)
States she needs Scientist, research (medical) for Dr Amalia Hailey and for ENT at [facility] for Dr Norva Pavlov

## 2015-12-12 NOTE — Telephone Encounter (Signed)
Muscle relaxer refilled. Pt aware

## 2015-12-15 ENCOUNTER — Emergency Department (HOSPITAL_COMMUNITY)
Admission: EM | Admit: 2015-12-15 | Discharge: 2015-12-15 | Disposition: A | Discharge status: Home / Self Care | Payer: Commercial Managed Care - HMO | Attending: Emergency Medicine | Admitting: Emergency Medicine

## 2015-12-15 ENCOUNTER — Encounter (HOSPITAL_COMMUNITY): Payer: Self-pay | Admitting: Emergency Medicine

## 2015-12-15 ENCOUNTER — Other Ambulatory Visit: Payer: Self-pay | Admitting: "Endocrinology

## 2015-12-15 DIAGNOSIS — Z87891 Personal history of nicotine dependence: Secondary | ICD-10-CM | POA: Insufficient documentation

## 2015-12-15 DIAGNOSIS — E039 Hypothyroidism, unspecified: Secondary | ICD-10-CM | POA: Diagnosis not present

## 2015-12-15 DIAGNOSIS — E119 Type 2 diabetes mellitus without complications: Secondary | ICD-10-CM | POA: Diagnosis not present

## 2015-12-15 DIAGNOSIS — I10 Essential (primary) hypertension: Secondary | ICD-10-CM | POA: Diagnosis not present

## 2015-12-15 DIAGNOSIS — Z79899 Other long term (current) drug therapy: Secondary | ICD-10-CM | POA: Diagnosis not present

## 2015-12-15 LAB — CBC WITH DIFFERENTIAL/PLATELET
BASOS ABS: 0 10*3/uL (ref 0.0–0.1)
BASOS PCT: 0 %
EOS ABS: 0.2 10*3/uL (ref 0.0–0.7)
EOS PCT: 2 %
HCT: 33.2 % — ABNORMAL LOW (ref 36.0–46.0)
HEMOGLOBIN: 10.5 g/dL — AB (ref 12.0–15.0)
LYMPHS ABS: 1.2 10*3/uL (ref 0.7–4.0)
Lymphocytes Relative: 15 %
MCH: 26.4 pg (ref 26.0–34.0)
MCHC: 31.6 g/dL (ref 30.0–36.0)
MCV: 83.4 fL (ref 78.0–100.0)
Monocytes Absolute: 0.6 10*3/uL (ref 0.1–1.0)
Monocytes Relative: 7 %
NEUTROS PCT: 76 %
Neutro Abs: 5.9 10*3/uL (ref 1.7–7.7)
PLATELETS: 205 10*3/uL (ref 150–400)
RBC: 3.98 MIL/uL (ref 3.87–5.11)
RDW: 14.1 % (ref 11.5–15.5)
WBC: 7.9 10*3/uL (ref 4.0–10.5)

## 2015-12-15 LAB — BASIC METABOLIC PANEL
Anion gap: 8 (ref 5–15)
BUN: 16 mg/dL (ref 6–20)
CO2: 28 mmol/L (ref 22–32)
Calcium: 9.2 mg/dL (ref 8.9–10.3)
Chloride: 101 mmol/L (ref 101–111)
Creatinine, Ser: 1.25 mg/dL — ABNORMAL HIGH (ref 0.44–1.00)
GFR, EST AFRICAN AMERICAN: 52 mL/min — AB (ref >60)
GFR, EST NON AFRICAN AMERICAN: 44 mL/min — AB (ref >60)
Glucose, Bld: 192 mg/dL — ABNORMAL HIGH (ref 65–99)
POTASSIUM: 3.7 mmol/L (ref 3.5–5.1)
SODIUM: 137 mmol/L (ref 135–145)

## 2015-12-15 LAB — CBG MONITORING, ED: GLUCOSE-CAPILLARY: 173 mg/dL — AB (ref 65–99)

## 2015-12-15 NOTE — ED Provider Notes (Signed)
[facility] DEPT Provider Note   CSN: [id number] Arrival date & time: 12/15/15  0820  By signing my name below, I, [name], attest that this documentation has been prepared under the direction and in the presence of Ezequiel Essex, MD . Electronically Signed: [name], Scribe. 12/15/2015. 8:42 AM.  History   Chief Complaint Chief Complaint  Patient presents with  . Hypertension   The history is provided by the patient. No language interpreter was used.   HPI Comments: Christine Dominguez is a 65 y.o. female with PMHx of HTN, HLD, Type II DM and benign ear tumor, who presents to the Emergency Department for an evaluation of HTN. Pt reports she checked her blood pressure this morning as she usually does before taking her medication which read 165/125; she states it is normally around 125/65. Pt states she took 2 tablets of 0.1 clonidine ~20 minutes before visited the ED this morning with mild relief. She notes she normally takes clonidine 0.1 mg BID and hydrochlorothiazide 12.5 mg every night for her HTN. Pt also reports lower back pain; however, she states this is chronic. She denies headache, vision changes, chest pain, shortness of breath, dizziness, nausea, vomiting, fever, appetite change and difficulty urinating or defecating. She also denies hx of MI.   Past Medical History:  Diagnosis Date  . Anemia   . Arthritis    Cervical spine  . Cancer ([place]) 1998, recurred in 2012   Paraganglioma of jugular tympamicum, recurrent in 2012  . Chronic back pain 2007   Disabled   . Essential hypertension, benign 1980  . GERD (gastroesophageal reflux disease) 2000  . Hydrocephalus    Treated with VP shunt and then intracranial tumor resection (?Cholesteatoma); no longer followed actively by neurosurgery or otolaryngology  . Hyperlipidemia 2013  . Hypothyroidism 2013  . Neuropathy ([place])   . Obesity   . Type 2 diabetes mellitus ([place]) 1991    Patient Active Problem List   Diagnosis Date Noted  . Medicare annual wellness visit, subsequent 08/12/2015  . Need for 23-polyvalent pneumococcal polysaccharide vaccine 08/12/2015  . Rectocele 12/31/2013  . Paraganglioma ([place]) 08/01/2013  . Primary hypothyroidism 11/14/2011  . Hyperlipidemia 11/14/2011  . GERD (gastroesophageal reflux disease) 11/03/2011  . Hearing loss in right ear 11/24/2010  . Diabetes mellitus with stage 3 chronic kidney disease ([place]) 06/11/2010  . Backache 11/18/2009  . CARPAL TUNNEL SYNDROME 10/19/2009  . Essential hypertension, benign 10/13/2009    Past Surgical History:  Procedure Laterality Date  . CATARACT EXTRACTION W/PHACO Right 07/17/2013   Procedure: CATARACT EXTRACTION PHACO AND INTRAOCULAR LENS PLACEMENT (IOC);  Surgeon: [name], MD;  Location: [facility];  Service: Ophthalmology;  Laterality: Right;  CDE:7.24  . COLONOSCOPY  06/2010  . Hemorrhoidectomy    . Intracranial mass resected  1998   At [facility]; ? location-right middle ear  . Teeth pulled    . TRIGGER FINGER RELEASE     Rght thumb  . TUBAL LIGATION  1980  . TUBAL LIGATION    . VENTRICULOPERITONEAL SHUNT  1998   At [facility]    OB History    Gravida Para Term Preterm AB Living   3 2     1 1    SAB TAB Ectopic Multiple Live Births       1   1       Home Medications    Prior to Admission medications   Medication Sig Start Date End Date Taking? Authorizing Provider  ACCU-CHEK FASTCLIX LANCETS  [place] TEST BLOOD SUGAR FOUR TIMES DAILY 10/29/15   Cassandria Anger, MD  ACCU-CHEK SMARTVIEW test strip TEST 4 TIMES DAILY 09/02/15   Cassandria Anger, MD  Alcohol Swabs (B-D SINGLE USE SWABS REGULAR) PADS TEST FOUR TIMES A DAY 07/07/15   Cassandria Anger, MD  aspirin EC 81 MG tablet Take 81 mg by mouth at bedtime.    Historical Provider, MD  B-D ULTRAFINE III SHORT PEN 31G X 8 MM MISC USE  TO INJECT EVERY DAY  WITH  LANTUS 09/15/15   Cassandria Anger, MD  Blood Glucose Calibration (ACCU-CHEK SMARTVIEW CONTROL) LIQD  USE AS DIRECTED 07/22/15   Cassandria Anger, MD  cloNIDine (CATAPRES) 0.1 MG tablet TAKE 1 TABLET TWICE DAILY 11/12/15   Satira Sark, MD  docusate sodium (STOOL SOFTENER) 100 MG capsule Take 100 mg by mouth as needed for mild constipation.    Historical Provider, MD  gabapentin (NEURONTIN) 100 MG capsule TAKE 1 CAPSULE TWICE DAILY 08/12/15   [name], MD  glucose [facility] GLUCOSE) 4 GM chewable tablet Chew by mouth as needed.  07/29/11   Historical Provider, MD  hydrochlorothiazide (HYDRODIURIL) 12.5 MG tablet TAKE 1 TABLET EVERY DAY 08/11/15   Satira Sark, MD  insulin glargine (LANTUS SOLOSTAR) 100 UNIT/ML injection Inject 40 Units into the skin every morning.    Historical Provider, MD  Insulin Glargine (LANTUS SOLOSTAR) 100 UNIT/ML Solostar Pen Inject 40 Units into the skin at bedtime. 10/10/15   Cassandria Anger, MD  Iron 66 MG TABS Take 1 tablet by mouth daily.     Historical Provider, MD  levothyroxine (SYNTHROID, LEVOTHROID) 75 MCG tablet TAKE 1 TABLET EVERY DAY BEFORE BREAKFAST 08/15/15   Cassandria Anger, MD  nystatin (MYCOSTATIN/NYSTOP) powder APPLY TOPICALLY  TO RASH ON LOWER ABDOMEN AT BEDTIME  AS NEEDED 10/06/15   [name], MD  omeprazole (PRILOSEC) 40 MG capsule TAKE 1 CAPSULE EVERY DAY 10/29/15   [name], MD  Simethicone (GAS RELIEF DROPS PO) Take by mouth as needed.    Historical Provider, MD  simvastatin (ZOCOR) 20 MG tablet TAKE 1 TABLET AT BEDTIME 12/03/15   Cassandria Anger, MD  sodium chloride (OCEAN) 0.65 % nasal spray 1 spray as needed.  12/04/13   Historical Provider, MD  tiZANidine (ZANAFLEX) 2 MG tablet Take 1 tablet (2 mg total) by mouth daily as needed for muscle spasms. 12/12/15   [name], MD  TRADJENTA 5 MG TABS tablet TAKE 1 TABLET EVERY DAY 10/20/15   Cassandria Anger, MD  valsartan (DIOVAN) 320 MG tablet TAKE 1 TABLET EVERY DAY (DOSE INCREASE) 11/27/15   [name], MD  Vitamin D, Ergocalciferol, (DRISDOL)  50000 units CAPS capsule TAKE 1 CAPSULE EVERY 7 DAYS 08/29/15   [name], MD    Family History Family History  Problem Relation Age of Onset  . Lung cancer Mother   . Hypertension Mother 48  . Hypertension Brother   . Diabetes Brother   . Diabetes Sister   . Kidney failure Brother   . Hypertension Maternal Grandmother   . Heart disease Maternal Grandmother   . Other Son     stillborn  . Other Son     died at birth  . Diabetes Maternal Grandfather   . Other Son     was murdered    Social History Social History  Substance Use Topics  . Smoking status: Former Smoker    Packs/day: 0.50  Years: 15.00    Types: Cigarettes    Quit date: 07/07/2001  . Smokeless tobacco: Never Used  . Alcohol use No     Allergies   Amaryl; Avandia [rosiglitazone maleate]; Glimepiride; Glipizide; Invokana [canagliflozin]; Lyrica [pregabalin]; Novolog [insulin aspart]; Rosiglitazone; Sitagliptin; and Aspirin   Review of Systems Review of Systems 10 systems reviewed and all are negative for acute change except as noted in the HPI.  Physical Exam Updated Vital Signs BP (!) 218/98 (BP Location: Left Arm) Comment: after walking   Pulse 89   Temp 97.5 F (36.4 C) (Oral)   Resp 18   Ht 5\' 1"  (1.549 m)   Wt 170 lb (77.1 kg)   SpO2 100%   BMI 32.12 kg/m   Physical Exam  Constitutional: She is oriented to person, place, and time. She appears well-developed and well-nourished. No distress.  HENT:  Head: Normocephalic and atraumatic.  Mouth/Throat: Oropharynx is clear and moist. No oropharyngeal exudate.  Eyes: Conjunctivae and EOM are normal. Pupils are equal, round, and reactive to light.  Neck: Normal range of motion. Neck supple.  No meningismus.  Cardiovascular: Normal rate, regular rhythm, normal heart sounds and intact distal pulses.   No murmur heard. Pulmonary/Chest: Effort normal and breath sounds normal. No respiratory distress.  Abdominal: Soft. There is no  tenderness. There is no rebound and no guarding.  Musculoskeletal: Normal range of motion. She exhibits no edema or tenderness.  Neurological: She is alert and oriented to person, place, and time. No cranial nerve deficit. She exhibits normal muscle tone. Coordination normal.  No ataxia on finger to nose bilaterally. No pronator drift. 5/5 strength throughout. CN 2-12 intact.Equal grip strength. Sensation intact.  Assymetric smile at baseline  Skin: Skin is warm.  Psychiatric: She has a normal mood and affect. Her behavior is normal.  Nursing note and vitals reviewed.  ED Treatments / Results  Labs (all labs ordered are listed, but only abnormal results are displayed) Labs Reviewed  CBC WITH DIFFERENTIAL/PLATELET - Abnormal; Notable for the following:       Result Value   Hemoglobin 10.5 (*)    HCT 33.2 (*)    All other components within normal limits  BASIC METABOLIC PANEL - Abnormal; Notable for the following:    Glucose, Bld 192 (*)    Creatinine, Ser 1.25 (*)    GFR calc non Af Amer 44 (*)    GFR calc Af Amer 52 (*)    All other components within normal limits  CBG MONITORING, ED - Abnormal; Notable for the following:    Glucose-Capillary 173 (*)    All other components within normal limits    EKG  EKG Interpretation  Date/Time:  Monday December 15 2015 08:49:30 EDT Ventricular Rate:  76 PR Interval:    QRS Duration: 102 QT Interval:  390 QTC Calculation: 439 R Axis:   34 Text Interpretation:  Sinus rhythm No significant change was found Confirmed by Wyvonnia Dusky  MD, Yula Crotwell ([id number]) on 12/15/2015 9:20:32 AM       Radiology No results found.  Procedures Procedures (including critical care time)  Medications Ordered in ED Medications - No data to display   Initial Impression / Assessment and Plan / ED Course  I have reviewed the triage vital signs and the nursing notes.  Pertinent labs & imaging results that were available during my care of the patient were  reviewed by me and considered in my medical decision making (see chart for details).  Clinical Course  DIAGNOSTIC STUDIES:  Oxygen Saturation is 100% on RA, normal by my interpretation.    COORDINATION OF CARE:  8:38 AM Discussed treatment plan with pt at bedside and pt agreed to plan. Patient from home with elevated blood pressure before taking her medications this morning. Denies recent medication change. States compliance. No headache or vision change. No chest pain or shortness of breath.  Neurological exam reassuring with chronic smile asymmetry. No other deficits.  EKG unchanged. Labs appear to be at baseline. Blood pressure has improved to 178/81 without further treatment in the ED. Patient denies headache, vision changes, chest pain or shortness of breath. She is asymptomatic.  Instructed to continue her blood pressure medications. Keep a record of her blood pressure and follow-up with her doctor for further medication adjustment. Return precautions discussed.  I personally performed the services described in this documentation, which was scribed in my presence. The recorded information has been reviewed and is accurate.   Final Clinical Impressions(s) / ED Diagnoses   Final diagnoses:  Essential hypertension    New Prescriptions New Prescriptions   No medications on file     Ezequiel Essex, MD 12/15/15 1547

## 2015-12-15 NOTE — ED Triage Notes (Signed)
Pt reports taking bp this morning and bp was 160/125, took 2 tablets of 0.1 clonidine this morning.  Pt not symptomatic from bp at this time.

## 2015-12-15 NOTE — Discharge Instructions (Signed)
Keep a record of your blood pressure and continue to take your medications as prescribed. Followup with your doctor for adjustment of your blood pressure medications. Return to the ED if you develop chest pain, shortness of breath, or any other concerns.

## 2015-12-17 ENCOUNTER — Encounter: Payer: Self-pay | Admitting: Family Medicine

## 2015-12-17 ENCOUNTER — Ambulatory Visit (INDEPENDENT_AMBULATORY_CARE_PROVIDER_SITE_OTHER): Payer: Commercial Managed Care - HMO | Admitting: Family Medicine

## 2015-12-17 VITALS — BP 180/88 | HR 91 | Resp 16 | Ht 61.0 in | Wt 168.0 lb

## 2015-12-17 DIAGNOSIS — E1122 Type 2 diabetes mellitus with diabetic chronic kidney disease: Secondary | ICD-10-CM | POA: Diagnosis not present

## 2015-12-17 DIAGNOSIS — N183 Chronic kidney disease, stage 3 (moderate): Secondary | ICD-10-CM

## 2015-12-17 DIAGNOSIS — I10 Essential (primary) hypertension: Secondary | ICD-10-CM | POA: Diagnosis not present

## 2015-12-17 DIAGNOSIS — D447 Neoplasm of uncertain behavior of aortic body and other paraganglia: Secondary | ICD-10-CM

## 2015-12-17 DIAGNOSIS — Z23 Encounter for immunization: Secondary | ICD-10-CM

## 2015-12-17 DIAGNOSIS — Z09 Encounter for follow-up examination after completed treatment for conditions other than malignant neoplasm: Secondary | ICD-10-CM | POA: Diagnosis not present

## 2015-12-17 MED ORDER — AMLODIPINE BESYLATE 5 MG PO TABS: 5.0000 mg | ORAL_TABLET | Freq: Every day | ORAL | 3 refills | Status: DC

## 2015-12-17 NOTE — Patient Instructions (Signed)
F/u as before, call if you need me before  Flu vaccine today  Blood pressure is high, new in addition to everything you are taking is amlodipine 5 mg , please take starting today with your 7 pm meds  Please work on good  health habits so that your health will improve. 1. Commitment to daily physical activity for 30 to 60  minutes, if you are able to do this.  2. Commitment to wise food choices. Aim for half of your  food intake to be vegetable and fruit, one quarter starchy foods, and one quarter protein. Try to eat on a regular schedule  3 meals per day, snacking between meals should be limited to vegetables or fruits or small portions of nuts. 64 ounces of water per day is generally recommended, unless you have specific health conditions, like heart failure or kidney failure where you will need to limit fluid intake.  3. Commitment to sufficient and a  good quality of physical and mental rest daily, generally between 6 to 8 hours per day.  WITH PERSISTANCE AND PERSEVERANCE, THE IMPOSSIBLE , BECOMES THE NORM!  It is important that you exercise regularly at least 30 minutes 5 times a week. If you develop chest pain, have severe difficulty breathing, or feel very tired, stop exercising immediately and seek medical attention    .Thanks for choosing [facility], we consider it a privelige to serve you.

## 2015-12-17 NOTE — Assessment & Plan Note (Signed)
Christine Dominguez is reminded of the importance of commitment to daily physical activity for 30 minutes or more, as able and the need to limit carbohydrate intake to 30 to 60 grams per meal to help with blood sugar control.   The need to take medication as prescribed, test blood sugar as directed, and to call between visits if there is a concern that blood sugar is uncontrolled is also discussed.   Christine Dominguez is reminded of the importance of daily foot exam, annual eye examination, and good blood sugar, blood pressure and cholesterol control. Uncontrolled , followed by endo, encouraged to follow eating plan and medication management as recommended  Diabetic Labs Latest Ref Rng & Units 12/15/2015 12/10/2015 09/04/2015 08/12/2015 08/06/2015  HbA1c <5.7 % - 8.8(H) 8.6(H) - -  Microalbumin Not estab mg/dL - - - 1.3 -  Micro/Creat Ratio <30 mcg/mg creat - - - 11 -  Chol 125 - 200 mg/dL - - - - -  HDL >=46 mg/dL - - - - -  Calc LDL <130 mg/dL - - - - -  Triglycerides <150 mg/dL - - - - -  Creatinine 0.44 - 1.00 mg/dL 1.25(H) 1.38(H) 1.32(H) - 1.25(H)   BP/Weight 12/17/2015 12/15/2015 09/11/2015 08/12/2015 07/16/2015 07/03/2015 123456  Systolic BP 99991111 Q000111Q 123456 [id number] 0000000 [id number]  Diastolic BP 88 73 96 82 82 98 83  Wt. (Lbs) 168 170 168 170 171 172 167  BMI 31.74 32.12 31.76 32.14 32.33 31.98 31.57   Foot/eye exam completion dates Latest Ref Rng & Units 11/25/2015 11/19/2014  Eye Exam No Retinopathy No Retinopathy No Retinopathy  Foot exam Order - - -  Foot Form Completion - - -

## 2015-12-17 NOTE — Assessment & Plan Note (Signed)
Followed annually at [facility], has upcoming appt , will enter referral

## 2015-12-17 NOTE — Assessment & Plan Note (Signed)
Uncontrolled add amlodipine 5 mg DASH diet and commitment to daily physical activity for a minimum of 30 minutes discussed and encouraged, as a part of hypertension management. The importance of attaining a healthy weight is also discussed.  BP/Weight 12/17/2015 12/15/2015 09/11/2015 08/12/2015 07/16/2015 07/03/2015 123456  Systolic BP 99991111 Q000111Q 123456 [id number] 0000000 [id number]  Diastolic BP 88 73 96 82 82 98 83  Wt. (Lbs) 168 170 168 170 171 172 167  BMI 31.74 32.12 31.76 32.14 32.33 31.98 31.57

## 2015-12-17 NOTE — Assessment & Plan Note (Signed)
Seen in eD on 9/11 with eklevated blood pressure, pt herself reports fluctuations of same, will increase medication , add amlodipine amnd f/u in 6 weeks, she sees cardiology in the interim DASH diet and commitment to daily physical activity for a minimum of 30 minutes discussed and encouraged, as a part of hypertension management. The importance of attaining a healthy weight is also discussed.  BP/Weight 12/17/2015 12/15/2015 09/11/2015 08/12/2015 07/16/2015 07/03/2015 123456  Systolic BP 99991111 Q000111Q 123456 [id number] 0000000 [id number]  Diastolic BP 88 73 96 82 82 98 83  Wt. (Lbs) 168 170 168 170 171 172 167  BMI 31.74 32.12 31.76 32.14 32.33 31.98 31.57

## 2015-12-17 NOTE — Progress Notes (Signed)
Christine Dominguez     MRN: [id number]      DOB: [birth year]   HPI Christine Dominguez  Patient in for follow up of recent hospitalization. Discharge summary, and laboratory and radiology data are reviewed, and any questions or concerns about recent hospitalization are discussed. Specific issues requiring follow up are specifically addressed. Also needs referral for management of paraganglioma at [facility] Reports fluctuation in bP with elevated readings in past week, reports medication compliance C/o back pain, no new stress  ROS Denies recent fever or chills. Denies sinus pressure, nasal congestion, ear pain or sore throat. Denies chest congestion, productive cough or wheezing. Denies chest pains, palpitations and leg swelling Denies abdominal pain, nausea, vomiting,diarrhea or constipation.   Denies dysuria, frequency, hesitancy or incontinence Denies headaches, seizures, numbness, or tingling. Denies depression, anxiety or insomnia. Denies skin break down or rash.   PE  BP (!) 180/88   Pulse 91   Resp 16   Ht 5\' 1"  (1.549 m)   Wt 168 lb (76.2 kg)   SpO2 95%   BMI 31.74 kg/m   Patient alert and oriented and in no cardiopulmonary distress.  HEENT: No facial asymmetry, EOMI,   oropharynx pink and moist.  Neck supple no JVD, no mass.  Chest: Clear to auscultation bilaterally.  CVS: S1, S2 no murmurs, no S3.Regular rate.  ABD: Soft non tender.   Ext: No edema  MS: Adequate ROM spine, shoulders, hips and knees.  Skin: Intact, no ulcerations or rash noted.  Psych: Good eye contact, normal affect. Memory intact not anxious or depressed appearing.  CNS: CN 2-12 intact, power,  normal throughout.no focal deficits noted.   Assessment & Plan  Essential hypertension, benign Uncontrolled add amlodipine 5 mg DASH diet and commitment to daily physical activity for a minimum of 30 minutes discussed and encouraged, as a part of hypertension management. The importance of  attaining a healthy weight is also discussed.  BP/Weight 12/17/2015 12/15/2015 09/11/2015 08/12/2015 07/16/2015 07/03/2015 123456  Systolic BP 99991111 Q000111Q 123456 [id number] [id number] 0000000 [id number]  Diastolic BP 88 73 96 82 82 98 83  Wt. (Lbs) 168 170 168 170 171 172 167  BMI 31.74 32.12 31.76 32.14 32.33 31.98 31.57        Encounter for examination following treatment at hospital Seen in eD on 9/11 with eklevated blood pressure, pt herself reports fluctuations of same, will increase medication , add amlodipine amnd f/u in 6 weeks, she sees cardiology in the interim DASH diet and commitment to daily physical activity for a minimum of 30 minutes discussed and encouraged, as a part of hypertension management. The importance of attaining a healthy weight is also discussed.  BP/Weight 12/17/2015 12/15/2015 09/11/2015 08/12/2015 07/16/2015 07/03/2015 123456  Systolic BP 99991111 Q000111Q 123456 [id number] 0000000 [id number]  Diastolic BP 88 73 96 82 82 98 83  Wt. (Lbs) 168 170 168 170 171 172 167  BMI 31.74 32.12 31.76 32.14 32.33 31.98 31.57       Diabetes mellitus with stage 3 chronic kidney disease ([place]) Christine Dominguez is reminded of the importance of commitment to daily physical activity for 30 minutes or more, as able and the need to limit carbohydrate intake to 30 to 60 grams per meal to help with blood sugar control.   The need to take medication as prescribed, test blood sugar as directed, and to call between visits if there is a concern that blood sugar is uncontrolled is also discussed.   Christine Dominguez is  reminded of the importance of daily foot exam, annual eye examination, and good blood sugar, blood pressure and cholesterol control. Uncontrolled , followed by endo, encouraged to follow eating plan and medication management as recommended  Diabetic Labs Latest Ref Rng & Units 12/15/2015 12/10/2015 09/04/2015 08/12/2015 08/06/2015  HbA1c <5.7 % - 8.8(H) 8.6(H) - -  Microalbumin Not estab mg/dL - - - 1.3 -  Micro/Creat Ratio <30  mcg/mg creat - - - 11 -  Chol 125 - 200 mg/dL - - - - -  HDL >=46 mg/dL - - - - -  Calc LDL <130 mg/dL - - - - -  Triglycerides <150 mg/dL - - - - -  Creatinine 0.44 - 1.00 mg/dL 1.25(H) 1.38(H) 1.32(H) - 1.25(H)   BP/Weight 12/17/2015 12/15/2015 09/11/2015 08/12/2015 07/16/2015 07/03/2015 123456  Systolic BP 99991111 Q000111Q 123456 [id number] [id number] 0000000 [id number]  Diastolic BP 88 73 96 82 82 98 83  Wt. (Lbs) 168 170 168 170 171 172 167  BMI 31.74 32.12 31.76 32.14 32.33 31.98 31.57   Foot/eye exam completion dates Latest Ref Rng & Units 11/25/2015 11/19/2014  Eye Exam No Retinopathy No Retinopathy No Retinopathy  Foot exam Order - - -  Foot Form Completion - - -        Paraganglioma Followed annually at [facility], has upcoming appt , will enter referral

## 2015-12-18 ENCOUNTER — Encounter: Payer: Self-pay | Admitting: "Endocrinology

## 2015-12-18 ENCOUNTER — Ambulatory Visit (INDEPENDENT_AMBULATORY_CARE_PROVIDER_SITE_OTHER): Payer: Commercial Managed Care - HMO | Admitting: "Endocrinology

## 2015-12-18 VITALS — BP 160/84 | HR 79 | Wt 166.8 lb

## 2015-12-18 DIAGNOSIS — E785 Hyperlipidemia, unspecified: Secondary | ICD-10-CM

## 2015-12-18 DIAGNOSIS — N183 Chronic kidney disease, stage 3 unspecified: Secondary | ICD-10-CM

## 2015-12-18 DIAGNOSIS — E1122 Type 2 diabetes mellitus with diabetic chronic kidney disease: Secondary | ICD-10-CM | POA: Diagnosis not present

## 2015-12-18 DIAGNOSIS — I10 Essential (primary) hypertension: Secondary | ICD-10-CM | POA: Diagnosis not present

## 2015-12-18 DIAGNOSIS — E039 Hypothyroidism, unspecified: Secondary | ICD-10-CM | POA: Diagnosis not present

## 2015-12-18 MED ORDER — LEVOTHYROXINE SODIUM 88 MCG PO TABS: ORAL_TABLET | ORAL | 6 refills | Status: DC

## 2015-12-18 MED ORDER — INSULIN GLARGINE 100 UNIT/ML SOLOSTAR PEN: 44.0000 [IU] | PEN_INJECTOR | Freq: Every day | SUBCUTANEOUS | 2 refills | Status: DC

## 2015-12-18 NOTE — Progress Notes (Signed)
Subjective:    Patient ID: Christine Dominguez, female    DOB: October 20, 1950, PCP Tula Nakayama, MD   Past Medical History:  Diagnosis Date  . Anemia   . Arthritis    Cervical spine  . Cancer ([place]) 1998, recurred in 2012   Paraganglioma of jugular tympamicum, recurrent in 2012  . Chronic back pain 2007   Disabled   . Essential hypertension, benign 1980  . GERD (gastroesophageal reflux disease) 2000  . Hydrocephalus    Treated with VP shunt and then intracranial tumor resection (?Cholesteatoma); no longer followed actively by neurosurgery or otolaryngology  . Hyperlipidemia 2013  . Hypothyroidism 2013  . Neuropathy ([place])   . Obesity   . Type 2 diabetes mellitus ([place]) 1991   Past Surgical History:  Procedure Laterality Date  . CATARACT EXTRACTION W/PHACO Right 07/17/2013   Procedure: CATARACT EXTRACTION PHACO AND INTRAOCULAR LENS PLACEMENT (IOC);  Surgeon: [name], MD;  Location: [facility];  Service: Ophthalmology;  Laterality: Right;  CDE:7.24  . COLONOSCOPY  06/2010  . Hemorrhoidectomy    . Intracranial mass resected  1998   At [facility]; ? location-right middle ear  . Teeth pulled    . TRIGGER FINGER RELEASE     Rght thumb  . TUBAL LIGATION  1980  . TUBAL LIGATION    . VENTRICULOPERITONEAL SHUNT  1998   At [place] History   Social History  . Marital status: Widowed    Spouse name: N/A  . Number of children: 3  . Years of education: N/A   Occupational History  . disabled     Social History Main Topics  . Smoking status: Former Smoker    Packs/day: 0.50    Years: 15.00    Types: Cigarettes    Quit date: 07/07/2001  . Smokeless tobacco: Never Used  . Alcohol use No  . Drug use: No  . Sexual activity: No     Comment: tubal   Other Topics Concern  . None   Social History Narrative   Mother of 3 - all children are deceased         Outpatient Encounter Prescriptions as of 12/18/2015  Medication Sig  . ACCU-CHEK FASTCLIX LANCETS MISC TEST  BLOOD SUGAR FOUR TIMES DAILY  . ACCU-CHEK SMARTVIEW test strip TEST 4 TIMES DAILY  . Alcohol Swabs (B-D SINGLE USE SWABS REGULAR) PADS TEST FOUR TIMES A DAY  . amLODipine (NORVASC) 5 MG tablet Take 1 tablet (5 mg total) by mouth daily.  Marland Kitchen aspirin EC 81 MG tablet Take 81 mg by mouth at bedtime.  . B-D ULTRAFINE III SHORT PEN 31G X 8 MM MISC USE  TO INJECT EVERY DAY  WITH  LANTUS  . Blood Glucose Calibration (ACCU-CHEK SMARTVIEW CONTROL) LIQD USE AS DIRECTED  . cloNIDine (CATAPRES) 0.1 MG tablet TAKE 1 TABLET TWICE DAILY  . docusate sodium (STOOL SOFTENER) 100 MG capsule Take 100 mg by mouth as needed for mild constipation.  . gabapentin (NEURONTIN) 100 MG capsule TAKE 1 CAPSULE TWICE DAILY  . glucose (SUNMARK GLUCOSE) 4 GM chewable tablet Chew by mouth as needed.   . hydrochlorothiazide (HYDRODIURIL) 12.5 MG tablet TAKE 1 TABLET EVERY DAY  . insulin glargine (LANTUS SOLOSTAR) 100 UNIT/ML injection Inject 40 Units into the skin every morning.  . Insulin Glargine (LANTUS SOLOSTAR) 100 UNIT/ML Solostar Pen Inject 44 Units into the skin at bedtime.  . Iron 66 MG TABS Take 1 tablet by mouth daily.   Marland Kitchen  levothyroxine (SYNTHROID, LEVOTHROID) 88 MCG tablet TAKE 1 TABLET EVERY DAY BEFORE BREAKFAST  . nystatin (MYCOSTATIN/NYSTOP) powder APPLY TOPICALLY  TO RASH ON LOWER ABDOMEN AT BEDTIME  AS NEEDED  . omeprazole (PRILOSEC) 40 MG capsule TAKE 1 CAPSULE EVERY DAY  . Simethicone (GAS RELIEF DROPS PO) Take by mouth as needed.  . simvastatin (ZOCOR) 20 MG tablet TAKE 1 TABLET AT BEDTIME  . sodium chloride (OCEAN) 0.65 % nasal spray 1 spray as needed.   Marland Kitchen tiZANidine (ZANAFLEX) 2 MG tablet Take 1 tablet (2 mg total) by mouth daily as needed for muscle spasms.  . TRADJENTA 5 MG TABS tablet TAKE 1 TABLET EVERY DAY  . valsartan (DIOVAN) 320 MG tablet TAKE 1 TABLET EVERY DAY (DOSE INCREASE)  . Vitamin D, Ergocalciferol, (DRISDOL) 50000 units CAPS capsule TAKE 1 CAPSULE EVERY 7 DAYS  . [DISCONTINUED] LANTUS  SOLOSTAR 100 UNIT/ML Solostar Pen INJECT 40 UNITS INTO THE SKIN AT BEDTIME.  . [DISCONTINUED] levothyroxine (SYNTHROID, LEVOTHROID) 75 MCG tablet TAKE 1 TABLET EVERY DAY BEFORE BREAKFAST   No facility-administered encounter medications on file as of 12/18/2015.    ALLERGIES: Allergies  Allergen Reactions  . Amaryl   . Avandia [Rosiglitazone Maleate]   . Glimepiride Itching  . Glipizide Itching  . Invokana [Canagliflozin]   . Lyrica [Pregabalin] Nausea And Vomiting  . Novolog [Insulin Aspart]   . Rosiglitazone     Other reaction(s): OTHER  . Sitagliptin     Other reaction(s): OTHER  . Aspirin Nausea Only and Other (See Comments)    Other Reaction: GI Upset More of a burning feeling when she take it Burning in stomach, nausea   VACCINATION STATUS: Immunization History  Administered Date(s) Administered  . Influenza Split 02/15/2012  . Influenza Whole 01/06/2010  . Influenza,inj,Quad PF,36+ Mos 05/14/2013, 01/17/2014, 04/03/2015, 12/17/2015  . Pneumococcal Conjugate-13 08/08/2014  . Pneumococcal Polysaccharide-23 10/13/2009, 08/12/2015  . Tdap 08/24/2005    Diabetes  She presents for her follow-up diabetic visit. She has type 2 diabetes mellitus. Her disease course has been stable. There are no hypoglycemic associated symptoms. Pertinent negatives for hypoglycemia include no confusion, headaches, pallor or seizures. Associated symptoms include fatigue, polydipsia and polyuria. Pertinent negatives for diabetes include no chest pain and no polyphagia. There are no hypoglycemic complications. Symptoms are stable. Diabetic complications include nephropathy. Risk factors for coronary artery disease include dyslipidemia, diabetes mellitus and sedentary lifestyle. Current diabetic treatment includes insulin injections. She is compliant with treatment some of the time. Her weight is stable. She is following a generally unhealthy diet. When asked about meal planning, she reported none. She has  had a previous visit with a dietitian. She rarely participates in exercise. There is no change in her home blood glucose trend. Her overall blood glucose range is 180-200 mg/dl. (She denies hypoglycemia, but she worries about it . ) An ACE inhibitor/angiotensin II receptor blocker is being taken.  Hyperlipidemia  This is a chronic problem. The current episode started more than 1 year ago. Exacerbating diseases include diabetes, hypothyroidism and obesity. Pertinent negatives include no chest pain, myalgias or shortness of breath. Current antihyperlipidemic treatment includes statins.  Hypertension  This is a chronic problem. The current episode started more than 1 year ago. Pertinent negatives include no chest pain, headaches, palpitations or shortness of breath. Risk factors for coronary artery disease include diabetes mellitus, dyslipidemia, obesity and sedentary lifestyle.      Review of Systems  Constitutional: Positive for fatigue. Negative for chills, fever and unexpected weight change.  HENT: Negative for trouble swallowing and voice change.   Eyes: Negative for visual disturbance.  Respiratory: Negative for cough, shortness of breath and wheezing.   Cardiovascular: Negative for chest pain, palpitations and leg swelling.  Gastrointestinal: Negative for diarrhea, nausea and vomiting.  Endocrine: Positive for polydipsia and polyuria. Negative for cold intolerance, heat intolerance and polyphagia.  Musculoskeletal: Negative for arthralgias and myalgias.  Skin: Negative for color change, pallor, rash and wound.  Neurological: Negative for seizures and headaches.  Psychiatric/Behavioral: Negative for confusion and suicidal ideas.    Objective:    BP (!) 160/84   Pulse 79   Wt 166 lb 12.8 oz (75.7 kg)   BMI 31.52 kg/m   Wt Readings from Last 3 Encounters:  12/18/15 166 lb 12.8 oz (75.7 kg)  12/17/15 168 lb (76.2 kg)  12/15/15 170 lb (77.1 kg)    Physical Exam  Constitutional: She  is oriented to person, place, and time. She appears well-developed.  HENT:  Head: Normocephalic and atraumatic.  Eyes: EOM are normal.  Neck: Normal range of motion. Neck supple. No tracheal deviation present. No thyromegaly present.  Cardiovascular: Normal rate and regular rhythm.   Pulmonary/Chest: Effort normal and breath sounds normal.  Abdominal: Soft. Bowel sounds are normal. There is no tenderness. There is no guarding.  Musculoskeletal: Normal range of motion. She exhibits no edema.  Neurological: She is alert and oriented to person, place, and time. She has normal reflexes. No cranial nerve deficit. Coordination normal.  Skin: Skin is warm and dry. No rash noted. No erythema. No pallor.  Psychiatric: She has a normal mood and affect. Judgment normal.    Results for orders placed or performed during the hospital encounter of 12/15/15  CBC with Differential/Platelet  Result Value Ref Range   WBC 7.9 4.0 - 10.5 K/uL   RBC 3.98 3.87 - 5.11 MIL/uL   Hemoglobin 10.5 (L) 12.0 - 15.0 g/dL   HCT 33.2 (L) 36.0 - 46.0 %   MCV 83.4 78.0 - 100.0 fL   MCH 26.4 26.0 - 34.0 pg   MCHC 31.6 30.0 - 36.0 g/dL   RDW 14.1 11.5 - 15.5 %   Platelets 205 150 - 400 K/uL   Neutrophils Relative % 76 %   Neutro Abs 5.9 1.7 - 7.7 K/uL   Lymphocytes Relative 15 %   Lymphs Abs 1.2 0.7 - 4.0 K/uL   Monocytes Relative 7 %   Monocytes Absolute 0.6 0.1 - 1.0 K/uL   Eosinophils Relative 2 %   Eosinophils Absolute 0.2 0.0 - 0.7 K/uL   Basophils Relative 0 %   Basophils Absolute 0.0 0.0 - 0.1 K/uL  Basic metabolic panel  Result Value Ref Range   Sodium 137 135 - 145 mmol/L   Potassium 3.7 3.5 - 5.1 mmol/L   Chloride 101 101 - 111 mmol/L   CO2 28 22 - 32 mmol/L   Glucose, Bld 192 (H) 65 - 99 mg/dL   BUN 16 6 - 20 mg/dL   Creatinine, Ser 1.25 (H) 0.44 - 1.00 mg/dL   Calcium 9.2 8.9 - 10.3 mg/dL   GFR calc non Af Amer 44 (L) >60 mL/min   GFR calc Af Amer 52 (L) >60 mL/min   Anion gap 8 5 - 15  CBG  monitoring, ED  Result Value Ref Range   Glucose-Capillary 173 (H) 65 - 99 mg/dL   Diabetic Labs (most recent): Lab Results  Component Value Date   HGBA1C 8.8 (H) 12/10/2015  HGBA1C 8.6 (H) 09/04/2015   HGBA1C 8.6 (H) 05/29/2015   Lipid Panel     Component Value Date/Time   CHOL 110 (L) 11/25/2014 0706   TRIG 107 11/25/2014 0706   HDL 38 (L) 11/25/2014 0706   CHOLHDL 2.9 11/25/2014 0706   VLDL 21 11/25/2014 0706   LDLCALC 51 11/25/2014 0706     Assessment & Plan:   1. Diabetes mellitus with stage 3 chronic kidney disease ([place]) -She remains at a high risk for more acute and chronic complications of diabetes which include CAD, CVA, CKD, retinopathy, and neuropathy. These are all discussed in detail with the patient.  Patient came with slightly above target glucose profile, and  recent A1c of 8.8% unchanged from last visit.   Glucose logs and insulin administration records pertaining to this visit,  to be scanned into patient's records.  Recent labs reviewed.   - I have re-counseled the patient on diet management andweight loss  by adopting a carbohydrate restricted / protein rich  Diet.  - Suggestion is made for patient to avoid simple carbohydrates   from their diet including Cakes , Desserts, Ice Cream,  Soda (  diet and regular) , Sweet Tea , Candies,  Chips, Cookies, Artificial Sweeteners,   and "Sugar-free" Products .  This will help patient to have stable blood glucose profile and potentially avoid unintended  Weight gain.  - Patient is advised to stick to a routine mealtimes to eat 3 meals  a day and avoid unnecessary snacks ( to snack only to correct hypoglycemia).  - The patient will be  scheduled with Jearld Fenton, RDN, CDE for individualized DM education.  - I have approached patient with the following individualized plan to manage diabetes and patient agrees.  She does not want to tighten control ,  due to fear of hypoglycemia.  - She reluctantly accepts to  increase her Lantus to 44 units  every morning with breakfast associated with monitoring her blood glucose twice a day, before breakfast and at bedtime. -Continue Tradjenta 5 mg by mouth every morning.   - She did not like Invokana .  - Patient specific target  for A1c; LDL, HDL, Triglycerides, and  Waist Circumference were discussed in detail.  2) BP/HTN: Controlled. Continue current medications including ACEI/ARB. 3) Lipids/HPL:  continue statins. 4)  Weight/Diet: CDE consult in progress, exercise, and carbohydrates information provided.  5) hypothyroidism:  her last thyroid function tests are consistent with appropriate replacement, however she would benefit from slight increase in her levothyroxine to 88 g by mouth every morning.   - We discussed about correct intake of levothyroxine, at fasting, with water, separated by at least 30 minutes from breakfast, and separated by more than 4 hours from calcium, iron, multivitamins, acid reflux medications (PPIs). -Patient is made aware of the fact that thyroid hormone replacement is needed for life, dose to be adjusted by periodic monitoring of thyroid function tests.  6) Chronic Care/Health Maintenance:  -Patient is on ACEI/ARB and Statin medications and encouraged to continue to follow up with Ophthalmology, Podiatrist at least yearly or according to recommendations, and advised to  stay away from smoking. I have recommended yearly flu vaccine and pneumonia vaccination at least every 5 years; moderate intensity exercise for up to 150 minutes weekly; and  sleep for at least 7 hours a day.  - 25 minutes of time was spent on the care of this patient , 50% of which was applied for counseling on diabetes complications  and their preventions.  - I advised patient to maintain close follow up with Tula Nakayama, MD for primary care needs.  Patient is asked to bring meter and  blood glucose logs during their next visit.   Follow up plan: -Return  in about 3 months (around 03/18/2016) for follow up with pre-visit labs, meter, and logs.  Glade Lloyd, MD Phone: [phone]  Fax: [phone]   12/18/2015, 9:10 AM

## 2015-12-18 NOTE — Patient Instructions (Signed)

## 2015-12-19 NOTE — Telephone Encounter (Signed)
BOTH REFERRALS ARE UP TO DATE

## 2015-12-24 NOTE — Progress Notes (Signed)
Cardiology Office Note  Date: 12/25/2015   ID: Christine Dominguez, DOB September 25, 1950, MRN [id number]  PCP: Tula Nakayama, MD  Primary Cardiologist: [name], MD   Chief Complaint  Patient presents with  . Hypertension    History of Present Illness: Christine Dominguez is a 65 y.o. female last seen in April 2017. She presents for a routine follow-up visit. She was seen in the ER earlier this month with elevated blood pressure. Dr. Moshe Cipro added Norvasc to her regimen. She seems to be tolerating this well. I reviewed her home blood pressure checks with cuff today, her average systolic has been in the [id number]. She has not had any chest pain or shortness of breath.  Past Medical History:  Diagnosis Date  . Anemia   . Arthritis    Cervical spine  . Cancer ([place]) 1998, recurred in 2012   Paraganglioma of jugular tympamicum, recurrent in 2012  . Chronic back pain 2007   Disabled   . Essential hypertension, benign 1980  . GERD (gastroesophageal reflux disease) 2000  . Hydrocephalus    Treated with VP shunt and then intracranial tumor resection (?Cholesteatoma); no longer followed actively by neurosurgery or otolaryngology  . Hyperlipidemia 2013  . Hypothyroidism 2013  . Neuropathy ([place])   . Obesity   . Type 2 diabetes mellitus ([facility]) 1991    Current Outpatient Prescriptions  Medication Sig Dispense Refill  . ACCU-CHEK FASTCLIX LANCETS MISC TEST BLOOD SUGAR FOUR TIMES DAILY 408 each 0  . ACCU-CHEK SMARTVIEW test strip TEST 4 TIMES DAILY 400 each 1  . Alcohol Swabs (B-D SINGLE USE SWABS REGULAR) PADS TEST FOUR TIMES A DAY 400 each 2  . amLODipine (NORVASC) 5 MG tablet Take 1 tablet (5 mg total) by mouth daily. 30 tablet 3  . aspirin EC 81 MG tablet Take 81 mg by mouth at bedtime.    . B-D ULTRAFINE III SHORT PEN 31G X 8 MM MISC USE  TO INJECT EVERY DAY  WITH  LANTUS 90 each 2  . Blood Glucose Calibration (ACCU-CHEK SMARTVIEW CONTROL) LIQD USE AS DIRECTED 1 each 2  .  cloNIDine (CATAPRES) 0.1 MG tablet TAKE 1 TABLET TWICE DAILY 180 tablet 1  . docusate sodium (STOOL SOFTENER) 100 MG capsule Take 100 mg by mouth as needed for mild constipation.    . gabapentin (NEURONTIN) 100 MG capsule TAKE 1 CAPSULE TWICE DAILY 180 capsule 1  . glucose (SUNMARK GLUCOSE) 4 GM chewable tablet Chew by mouth as needed.     . hydrochlorothiazide (HYDRODIURIL) 12.5 MG tablet TAKE 1 TABLET EVERY DAY 90 tablet 3  . insulin glargine (LANTUS SOLOSTAR) 100 UNIT/ML injection Inject 40 Units into the skin every morning.    . Insulin Glargine (LANTUS SOLOSTAR) 100 UNIT/ML Solostar Pen Inject 44 Units into the skin at bedtime. 45 mL 2  . Iron 66 MG TABS Take 1 tablet by mouth daily.     Marland Kitchen levothyroxine (SYNTHROID, LEVOTHROID) 88 MCG tablet TAKE 1 TABLET EVERY DAY BEFORE BREAKFAST 30 tablet 6  . nystatin (MYCOSTATIN/NYSTOP) powder APPLY TOPICALLY  TO RASH ON LOWER ABDOMEN AT BEDTIME  AS NEEDED 120 g 0  . omeprazole (PRILOSEC) 40 MG capsule TAKE 1 CAPSULE EVERY DAY 90 capsule 0  . Simethicone (GAS RELIEF DROPS PO) Take by mouth as needed.    . simvastatin (ZOCOR) 20 MG tablet TAKE 1 TABLET AT BEDTIME 90 tablet 1  . sodium chloride (OCEAN) 0.65 % nasal spray 1 spray as needed.     Marland Kitchen  tiZANidine (ZANAFLEX) 2 MG tablet Take 1 tablet (2 mg total) by mouth daily as needed for muscle spasms. 30 tablet 2  . TRADJENTA 5 MG TABS tablet TAKE 1 TABLET EVERY DAY 90 tablet 0  . valsartan (DIOVAN) 320 MG tablet TAKE 1 TABLET EVERY DAY (DOSE INCREASE) 90 tablet 1  . Vitamin D, Ergocalciferol, (DRISDOL) 50000 units CAPS capsule TAKE 1 CAPSULE EVERY 7 DAYS 12 capsule 1   No current facility-administered medications for this visit.    Allergies:  Amaryl; Avandia [rosiglitazone maleate]; Glimepiride; Glipizide; Invokana [canagliflozin]; Lyrica [pregabalin]; Novolog [insulin aspart]; Rosiglitazone; Sitagliptin; and Aspirin   Social History: The patient  reports that she quit smoking about 14 years ago. Her  smoking use included Cigarettes. She has a 7.50 pack-year smoking history. She has never used smokeless tobacco. She reports that she does not drink alcohol or use drugs.   ROS:  Please see the history of present illness. Otherwise, complete review of systems is positive for none.  All other systems are reviewed and negative.   Physical Exam: VS:  BP 140/70   Pulse 89   Ht 5' 1.5" (1.562 m)   Wt 166 lb (75.3 kg)   SpO2 99%   BMI 30.86 kg/m , BMI Body mass index is 30.86 kg/m.  Wt Readings from Last 3 Encounters:  12/25/15 166 lb (75.3 kg)  12/18/15 166 lb 12.8 oz (75.7 kg)  12/17/15 168 lb (76.2 kg)    Patient appears comfortable at rest.  HEENT: Conjunctiva normal, oropharynx clear with moist mucosa.  Neck: Supple, no carotid bruits, VP shunt on left side, no thyromegaly.  Lungs: Clear to auscultation, nonlabored breathing at rest.  Cardiac: Regular rate and rhythm, no S3 soft, systolic murmur, no pericardial rub.  Abdomen: Soft, nontender, bowel sounds present, no guarding or rebound.  Extremities: Trace leg edema, distal pulses 2+.   ECG: I personally reviewed the tracing from 12/15/2015 which showed normal sinus rhythm.  Recent Labwork: 09/04/2015: TSH 0.12 12/10/2015: ALT 12; AST 11 12/15/2015: BUN 16; Creatinine, Ser 1.25; Hemoglobin 10.5; Platelets 205; Potassium 3.7; Sodium 137     Component Value Date/Time   CHOL 110 (L) 11/25/2014 0706   TRIG 107 11/25/2014 0706   HDL 38 (L) 11/25/2014 0706   CHOLHDL 2.9 11/25/2014 0706   VLDL 21 11/25/2014 0706   LDLCALC 51 11/25/2014 0706    Assessment and Plan:  1. Essential hypertension, agree with recent medication adjustments made. She will continue to follow with Dr. Moshe Cipro, continue to check blood pressure by cuff at home.  2. Hyperlipidemia, on Zocor with LDL 51.  Current medicines were reviewed with the patient today.  Disposition: Follow-up with me in 8 months.  Signed, Satira Sark, MD,  [facility] 12/25/2015 11:36 AM    [facility]. [address], [place], [place] [address] Phone: [phone]; Fax: [phone]

## 2015-12-25 ENCOUNTER — Encounter: Payer: Self-pay | Admitting: Cardiology

## 2015-12-25 ENCOUNTER — Ambulatory Visit (INDEPENDENT_AMBULATORY_CARE_PROVIDER_SITE_OTHER): Payer: Commercial Managed Care - HMO | Admitting: Cardiology

## 2015-12-25 VITALS — BP 140/70 | HR 89 | Ht 61.5 in | Wt 166.0 lb

## 2015-12-25 DIAGNOSIS — E785 Hyperlipidemia, unspecified: Secondary | ICD-10-CM

## 2015-12-25 DIAGNOSIS — I10 Essential (primary) hypertension: Secondary | ICD-10-CM

## 2015-12-25 NOTE — Patient Instructions (Signed)
Your physician wants you to follow-up in:  8 months Dr Ferne Reus will receive a reminder letter in the mail two months in advance. If you don't receive a letter, please call our office to schedule the follow-up appointment.    Your physician recommends that you continue on your current medications as directed. Please refer to the Current Medication list given to you today.    If you need a refill on your cardiac medications before your next appointment, please call your pharmacy.   Thank you for choosing [facility] !

## 2015-12-26 ENCOUNTER — Other Ambulatory Visit: Payer: Self-pay | Admitting: "Endocrinology

## 2015-12-31 ENCOUNTER — Other Ambulatory Visit: Payer: Self-pay | Admitting: Family Medicine

## 2015-12-31 ENCOUNTER — Other Ambulatory Visit: Payer: Self-pay | Admitting: "Endocrinology

## 2016-01-02 ENCOUNTER — Other Ambulatory Visit: Payer: Commercial Managed Care - HMO | Admitting: Adult Health

## 2016-01-05 ENCOUNTER — Other Ambulatory Visit: Payer: Self-pay | Admitting: Family Medicine

## 2016-01-05 ENCOUNTER — Encounter: Payer: Self-pay | Admitting: Adult Health

## 2016-01-05 ENCOUNTER — Other Ambulatory Visit (HOSPITAL_COMMUNITY)
Admission: RE | Admit: 2016-01-05 | Discharge: 2016-01-05 | Disposition: A | Discharge status: Home / Self Care | Payer: Commercial Managed Care - HMO | Source: Ambulatory Visit | Attending: Adult Health | Admitting: Adult Health

## 2016-01-05 ENCOUNTER — Ambulatory Visit (INDEPENDENT_AMBULATORY_CARE_PROVIDER_SITE_OTHER): Payer: Commercial Managed Care - HMO | Admitting: Adult Health

## 2016-01-05 ENCOUNTER — Telehealth: Payer: Self-pay | Admitting: *Deleted

## 2016-01-05 VITALS — BP 180/80 | HR 84 | Ht 61.0 in | Wt 167.0 lb

## 2016-01-05 DIAGNOSIS — Z1151 Encounter for screening for human papillomavirus (HPV): Secondary | ICD-10-CM | POA: Insufficient documentation

## 2016-01-05 DIAGNOSIS — Z124 Encounter for screening for malignant neoplasm of cervix: Secondary | ICD-10-CM

## 2016-01-05 DIAGNOSIS — Z1212 Encounter for screening for malignant neoplasm of rectum: Secondary | ICD-10-CM

## 2016-01-05 DIAGNOSIS — Z01419 Encounter for gynecological examination (general) (routine) without abnormal findings: Secondary | ICD-10-CM | POA: Insufficient documentation

## 2016-01-05 LAB — HEMOCCULT GUIAC POC 1CARD (OFFICE): FECAL OCCULT BLD: NEGATIVE

## 2016-01-05 NOTE — Patient Instructions (Addendum)
Pap in 2 years  Labs with PCP Mammogram yearly

## 2016-01-05 NOTE — Progress Notes (Addendum)
Subjective:     Patient ID: Christine Dominguez, female   DOB: 22-May-1950, 65 y.o.   MRN: [id number]  HPI Christine Dominguez is a 65 year old black female,widowed in for a pap smear,and pelvic exam, she sees Dr Christine Dominguez and has physical with her.No complaints, she says she is blessed.She is a mother in her church.  Review of Systems Patient denies any headaches, hearing loss, fatigue, blurred vision, shortness of breath, chest pain, abdominal pain, problems with bowel movements, urination, or intercourse(not having sex). No joint pain or mood swings.   Reviewed past medical,surgical, social and family history. Reviewed medications and allergies.  Objective:   Physical Exam BP (!) 180/80   Pulse 84   Ht 5\' 1"  (1.549 m)   Wt 167 lb (75.8 kg)   BMI 31.55 kg/m  Skin warm and dry. Neck: mid line trachea, normal thyroid, good ROM, no lymphadenopathy noted. Lungs: clear to ausculation bilaterally. Cardiovascular: regular rate and rhythm.   Pelvic: external genitalia is normal in appearance no lesions, vagina: pale with decreased moisture and rugae,urethra has no lesions or masses noted, cervix:smooth,pap with HPV performed, uterus: normal size, shape and contour, non tender, no masses felt, adnexa: no masses or tenderness noted. Bladder is non tender and no masses felt. On rectal exam ,has good tone, no polyps or hemorrhoids felt. Hemoccult negative. PHQ 2 score is 0.  Assessment:     1. Routine cervical smear       Plan:        Pap in 2 years  Labs with PCP Mammogram yearly

## 2016-01-06 DIAGNOSIS — N183 Chronic kidney disease, stage 3 (moderate): Secondary | ICD-10-CM | POA: Diagnosis not present

## 2016-01-06 DIAGNOSIS — E1129 Type 2 diabetes mellitus with other diabetic kidney complication: Secondary | ICD-10-CM | POA: Diagnosis not present

## 2016-01-06 DIAGNOSIS — E1122 Type 2 diabetes mellitus with diabetic chronic kidney disease: Secondary | ICD-10-CM | POA: Diagnosis not present

## 2016-01-06 DIAGNOSIS — E785 Hyperlipidemia, unspecified: Secondary | ICD-10-CM | POA: Diagnosis not present

## 2016-01-06 LAB — CYTOLOGY - PAP

## 2016-01-07 ENCOUNTER — Encounter: Payer: Self-pay | Admitting: Family Medicine

## 2016-01-07 LAB — COMPLETE METABOLIC PANEL WITH GFR
ALBUMIN: 3.9 g/dL (ref 3.6–5.1)
ALK PHOS: 60 U/L (ref 33–130)
ALT: 8 U/L (ref 6–29)
AST: 11 U/L (ref 10–35)
BILIRUBIN TOTAL: 0.3 mg/dL (ref 0.2–1.2)
BUN: 18 mg/dL (ref 7–25)
CALCIUM: 9.5 mg/dL (ref 8.6–10.4)
CO2: 29 mmol/L (ref 20–31)
Chloride: 104 mmol/L (ref 98–110)
Creat: 1.42 mg/dL — ABNORMAL HIGH (ref 0.50–0.99)
GFR, Est African American: 45 mL/min — ABNORMAL LOW (ref >=60)
GFR, Est Non African American: 39 mL/min — ABNORMAL LOW (ref >=60)
Glucose, Bld: 122 mg/dL — ABNORMAL HIGH (ref 65–99)
POTASSIUM: 4.3 mmol/L (ref 3.5–5.3)
Sodium: 140 mmol/L (ref 135–146)
Total Protein: 7.5 g/dL (ref 6.1–8.1)

## 2016-01-07 LAB — LIPID PANEL
CHOL/HDL RATIO: 3 ratio (ref <=5.0)
CHOLESTEROL: 109 mg/dL — AB (ref 125–200)
HDL: 36 mg/dL — AB (ref >=46)
LDL Cholesterol: 51 mg/dL (ref <130)
Triglycerides: 111 mg/dL (ref <150)
VLDL: 22 mg/dL (ref <30)

## 2016-01-09 ENCOUNTER — Other Ambulatory Visit: Payer: Self-pay | Admitting: Family Medicine

## 2016-01-13 ENCOUNTER — Ambulatory Visit (INDEPENDENT_AMBULATORY_CARE_PROVIDER_SITE_OTHER): Payer: Commercial Managed Care - HMO | Admitting: Family Medicine

## 2016-01-13 ENCOUNTER — Encounter: Payer: Self-pay | Admitting: Family Medicine

## 2016-01-13 VITALS — BP 138/70 | HR 63 | Ht 61.0 in | Wt 167.0 lb

## 2016-01-13 DIAGNOSIS — K219 Gastro-esophageal reflux disease without esophagitis: Secondary | ICD-10-CM

## 2016-01-13 DIAGNOSIS — E785 Hyperlipidemia, unspecified: Secondary | ICD-10-CM

## 2016-01-13 DIAGNOSIS — M5440 Lumbago with sciatica, unspecified side: Secondary | ICD-10-CM

## 2016-01-13 DIAGNOSIS — E1122 Type 2 diabetes mellitus with diabetic chronic kidney disease: Secondary | ICD-10-CM

## 2016-01-13 DIAGNOSIS — N183 Chronic kidney disease, stage 3 (moderate): Secondary | ICD-10-CM

## 2016-01-13 DIAGNOSIS — D447 Neoplasm of uncertain behavior of aortic body and other paraganglia: Secondary | ICD-10-CM

## 2016-01-13 DIAGNOSIS — I10 Essential (primary) hypertension: Secondary | ICD-10-CM | POA: Diagnosis not present

## 2016-01-13 MED ORDER — SIMVASTATIN 10 MG PO TABS: 10.0000 mg | ORAL_TABLET | Freq: Every day | ORAL | 3 refills | Status: DC

## 2016-01-13 NOTE — Assessment & Plan Note (Signed)
Deteriorated, followed by endo Dietary advice given and referral to nutrition offered, pt declined Ms. Thull is reminded of the importance of commitment to daily physical activity for 30 minutes or more, as able and the need to limit carbohydrate intake to 30 to 60 grams per meal to help with blood sugar control.   The need to take medication as prescribed, test blood sugar as directed, and to call between visits if there is a concern that blood sugar is uncontrolled is also discussed.   Ms. Flory is reminded of the importance of daily foot exam, annual eye examination, and good blood sugar, blood pressure and cholesterol control.  Diabetic Labs Latest Ref Rng & Units 01/06/2016 12/15/2015 12/10/2015 09/04/2015 08/12/2015  HbA1c <5.7 % - - 8.8(H) 8.6(H) -  Microalbumin Not estab mg/dL - - - - 1.3  Micro/Creat Ratio <30 mcg/mg creat - - - - 11  Chol 125 - 200 mg/dL 109(L) - - - -  HDL >=46 mg/dL 36(L) - - - -  Calc LDL <130 mg/dL 51 - - - -  Triglycerides <150 mg/dL 111 - - - -  Creatinine 0.50 - 0.99 mg/dL 1.42(H) 1.25(H) 1.38(H) 1.32(H) -   BP/Weight 01/13/2016 01/05/2016 12/25/2015 12/18/2015 12/17/2015 0000000 [id number]  Systolic BP 0000000 [id number] 0000000 [id number] Q000111Q 123456  Diastolic BP 70 80 70 84 88 73 96  Wt. (Lbs) 167 167 166 166.8 168 170 168  BMI 31.55 31.55 30.86 31.52 31.74 32.12 31.76   Foot/eye exam completion dates Latest Ref Rng & Units 11/25/2015 11/19/2014  Eye Exam No Retinopathy No Retinopathy No Retinopathy  Foot exam Order - - -  Foot Form Completion - - -

## 2016-01-13 NOTE — Assessment & Plan Note (Signed)
Controlled, no change in medication DASH diet and commitment to daily physical activity for a minimum of 30 minutes discussed and encouraged, as a part of hypertension management. The importance of attaining a healthy weight is also discussed.  BP/Weight 01/13/2016 01/05/2016 12/25/2015 12/18/2015 12/17/2015 0000000 [id number]  Systolic BP 0000000 [id number] 0000000 [id number] Q000111Q 123456  Diastolic BP 70 80 70 84 88 73 96  Wt. (Lbs) 167 167 166 166.8 168 170 168  BMI 31.55 31.55 30.86 31.52 31.74 32.12 31.76

## 2016-01-13 NOTE — Assessment & Plan Note (Signed)
Controlled, no change in medication  

## 2016-01-13 NOTE — Assessment & Plan Note (Signed)
Has f/u at [facility] in next 2 months, currently asymptomatic

## 2016-01-13 NOTE — Progress Notes (Signed)
Christine Dominguez     MRN: [id number]      DOB: [birth year]   HPI Christine Dominguez is here for follow up and re-evaluation of chronic medical conditions, medication management and review of any available recent lab and radiology data.  Preventive health is updated, specifically  Cancer screening and Immunization.   Questions or concerns regarding consultations or procedures which the PT has had in the interim are  addressed. The PT denies any adverse reactions to current medications since the last visit.  There are no new concerns.  There are no specific complaints   ROS Denies recent fever or chills. Denies sinus pressure, nasal congestion, ear pain or sore throat. Denies chest congestion, productive cough or wheezing. Denies chest pains, palpitations and leg swelling Denies abdominal pain, nausea, vomiting,diarrhea or constipation.   Denies dysuria, frequency, hesitancy or incontinence. Denies joint pain, swelling and limitation in mobility. Denies headaches, seizures, numbness, or tingling. Denies depression, anxiety or insomnia. Denies skin break down or rash.   PE  BP 138/70   Pulse 63   Ht 5\' 1"  (1.549 m)   Wt 167 lb (75.8 kg)   SpO2 98%   BMI 31.55 kg/m   Patient alert and oriented and in no cardiopulmonary distress.  HEENT: No facial asymmetry, EOMI,   oropharynx pink and moist.  Neck supple no JVD, no mass.  Chest: Clear to auscultation bilaterally.  CVS: S1, S2 no murmurs, no S3.Regular rate.  ABD: Soft non tender.   Ext: No edema  MS: Adequate ROM spine, shoulders, hips and knees.  Skin: Intact, no ulcerations or rash noted.  Psych: Good eye contact, normal affect. Memory intact not anxious or depressed appearing.  CNS: CN 2-12 intact, power,  normal throughout.no focal deficits noted.   Assessment & Plan  Essential hypertension, benign Controlled, no change in medication DASH diet and commitment to daily physical activity for a minimum  of 30 minutes discussed and encouraged, as a part of hypertension management. The importance of attaining a healthy weight is also discussed.  BP/Weight 01/13/2016 01/05/2016 12/25/2015 12/18/2015 12/17/2015 0000000 [id number]  Systolic BP 0000000 [id number] 0000000 [id number] Q000111Q 123456  Diastolic BP 70 80 70 84 88 73 96  Wt. (Lbs) 167 167 166 166.8 168 170 168  BMI 31.55 31.55 30.86 31.52 31.74 32.12 31.76       Hyperlipidemia Overcorrected, needs to exercise , low hDL, lower simvastatin dose  Paraganglioma Has f/u at [facility] in next 2 months, currently asymptomatic  Backache Controlled, no change in medication   Diabetes mellitus with stage 3 chronic kidney disease ([place]) Deteriorated, followed by endo Dietary advice given and referral to nutrition offered, pt declined Christine Dominguez is reminded of the importance of commitment to daily physical activity for 30 minutes or more, as able and the need to limit carbohydrate intake to 30 to 60 grams per meal to help with blood sugar control.   The need to take medication as prescribed, test blood sugar as directed, and to call between visits if there is a concern that blood sugar is uncontrolled is also discussed.   Christine Dominguez is reminded of the importance of daily foot exam, annual eye examination, and good blood sugar, blood pressure and cholesterol control.  Diabetic Labs Latest Ref Rng & Units 01/06/2016 12/15/2015 12/10/2015 09/04/2015 08/12/2015  HbA1c <5.7 % - - 8.8(H) 8.6(H) -  Microalbumin Not estab mg/dL - - - - 1.3  Micro/Creat Ratio <30 mcg/mg creat - - - -  11  Chol 125 - 200 mg/dL 109(L) - - - -  HDL >=46 mg/dL 36(L) - - - -  Calc LDL <130 mg/dL 51 - - - -  Triglycerides <150 mg/dL 111 - - - -  Creatinine 0.50 - 0.99 mg/dL 1.42(H) 1.25(H) 1.38(H) 1.32(H) -   BP/Weight 01/13/2016 01/05/2016 12/25/2015 12/18/2015 12/17/2015 0000000 [id number]  Systolic BP 0000000 [id number] 0000000 99991111 Q000111Q 123456  Diastolic BP 70 80 70 84 88 73 96  Wt. (Lbs) 167 167 166  166.8 168 170 168  BMI 31.55 31.55 30.86 31.52 31.74 32.12 31.76   Foot/eye exam completion dates Latest Ref Rng & Units 11/25/2015 11/19/2014  Eye Exam No Retinopathy No Retinopathy No Retinopathy  Foot exam Order - - -  Foot Form Completion - - -        GERD (gastroesophageal reflux disease) Controlled, no change in medication

## 2016-01-13 NOTE — Patient Instructions (Addendum)
Annual physical exam in 2 month, call if you need me sooner  New lower dose of simvastatin 10 mg at bedtime.  EVERY DAY, good cholesterol is low.  Excellent memory, 100%   Excellent blood pressure  Thank you  for choosing [facility]. We consider it a privelige to serve you.  Delivering excellent health care in a caring and  compassionate way is our goal.  Partnering with you,  so that together we can achieve this goal is our strategy.     DASH Eating Plan DASH stands for "Dietary Approaches to Stop Hypertension." The DASH eating plan is a healthy eating plan that has been shown to reduce high blood pressure (hypertension). Additional health benefits may include reducing the risk of type 2 diabetes mellitus, heart disease, and stroke. The DASH eating plan may also help with weight loss. WHAT DO I NEED TO KNOW ABOUT THE DASH EATING PLAN? For the DASH eating plan, you will follow these general guidelines:  Choose foods with a percent daily value for sodium of less than 5% (as listed on the food label).  Use salt-free seasonings or herbs instead of table salt or sea salt.  Check with your health care provider or pharmacist before using salt substitutes.  Eat lower-sodium products, often labeled as "lower sodium" or "no salt added."  Eat fresh foods.  Eat more vegetables, fruits, and low-fat dairy products.  Choose whole grains. Look for the word "whole" as the first word in the ingredient list.  Choose fish and skinless chicken or [place] more often than red meat. Limit fish, poultry, and meat to 6 oz (170 g) each day.  Limit sweets, desserts, sugars, and sugary drinks.  Choose heart-healthy fats.  Limit cheese to 1 oz (28 g) per day.  Eat more home-cooked food and less restaurant, buffet, and fast food.  Limit fried foods.  Cook foods using methods other than frying.  Limit canned vegetables. If you do use them, rinse them well to decrease the  sodium.  When eating at a restaurant, ask that your food be prepared with less salt, or no salt if possible. WHAT FOODS CAN I EAT? Seek help from a dietitian for individual calorie needs. Grains Whole grain or whole wheat bread. Brown rice. Whole grain or whole wheat pasta. Quinoa, bulgur, and whole grain cereals. Low-sodium cereals. Corn or whole wheat flour tortillas. Whole grain cornbread. Whole grain crackers. Low-sodium crackers. Vegetables Fresh or frozen vegetables (raw, steamed, roasted, or grilled). Low-sodium or reduced-sodium tomato and vegetable juices. Low-sodium or reduced-sodium tomato sauce and paste. Low-sodium or reduced-sodium canned vegetables.  Fruits All fresh, canned (in natural juice), or frozen fruits. Meat and Other Protein Products Ground beef (85% or leaner), grass-fed beef, or beef trimmed of fat. Skinless chicken or [place]. Ground chicken or [place]. Pork trimmed of fat. All fish and seafood. Eggs. Dried beans, peas, or lentils. Unsalted nuts and seeds. Unsalted canned beans. Dairy Low-fat dairy products, such as skim or 1% milk, 2% or reduced-fat cheeses, low-fat ricotta or cottage cheese, or plain low-fat yogurt. Low-sodium or reduced-sodium cheeses. Fats and Oils Tub margarines without trans fats. Light or reduced-fat mayonnaise and salad dressings (reduced sodium). Avocado. Safflower, olive, or canola oils. Natural peanut or almond butter. Other Unsalted popcorn and pretzels. The items listed above may not be a complete list of recommended foods or beverages. Contact your dietitian for more options. WHAT FOODS ARE NOT RECOMMENDED? Grains White bread. White pasta. White rice. Refined cornbread. Bagels and  croissants. Crackers that contain trans fat. Vegetables Creamed or fried vegetables. Vegetables in a cheese sauce. Regular canned vegetables. Regular canned tomato sauce and paste. Regular tomato and vegetable juices. Fruits Dried fruits. Canned fruit in  light or heavy syrup. Fruit juice. Meat and Other Protein Products Fatty cuts of meat. Ribs, chicken wings, bacon, sausage, bologna, salami, chitterlings, fatback, hot dogs, bratwurst, and packaged luncheon meats. Salted nuts and seeds. Canned beans with salt. Dairy Whole or 2% milk, cream, half-and-half, and cream cheese. Whole-fat or sweetened yogurt. Full-fat cheeses or blue cheese. Nondairy creamers and whipped toppings. Processed cheese, cheese spreads, or cheese curds. Condiments Onion and garlic salt, seasoned salt, table salt, and sea salt. Canned and packaged gravies. Worcestershire sauce. Tartar sauce. Barbecue sauce. Teriyaki sauce. Soy sauce, including reduced sodium. Steak sauce. Fish sauce. Oyster sauce. Cocktail sauce. Horseradish. Ketchup and mustard. Meat flavorings and tenderizers. Bouillon cubes. Hot sauce. Tabasco sauce. Marinades. Taco seasonings. Relishes. Fats and Oils Butter, stick margarine, lard, shortening, ghee, and bacon fat. Coconut, palm kernel, or palm oils. Regular salad dressings. Other Pickles and olives. Salted popcorn and pretzels. The items listed above may not be a complete list of foods and beverages to avoid. Contact your dietitian for more information. WHERE CAN I FIND MORE INFORMATION? National Heart, Lung, and Blood Institute: travelstabloid.com   This information is not intended to replace advice given to you by your health care provider. Make sure you discuss any questions you have with your health care provider.   Document Released: 03/11/2011 Document Revised: 04/12/2014 Document Reviewed: 01/24/2013 Elsevier Interactive Patient Education [employer].

## 2016-01-13 NOTE — Progress Notes (Signed)
138 70 

## 2016-01-13 NOTE — Assessment & Plan Note (Signed)
Overcorrected, needs to exercise , low hDL, lower simvastatin dose

## 2016-01-14 ENCOUNTER — Encounter: Payer: Self-pay | Admitting: Podiatry

## 2016-01-14 ENCOUNTER — Ambulatory Visit (INDEPENDENT_AMBULATORY_CARE_PROVIDER_SITE_OTHER): Payer: Commercial Managed Care - HMO | Admitting: Podiatry

## 2016-01-14 VITALS — BP 157/73 | HR 79 | Resp 18

## 2016-01-14 DIAGNOSIS — M79676 Pain in unspecified toe(s): Secondary | ICD-10-CM | POA: Diagnosis not present

## 2016-01-14 DIAGNOSIS — B351 Tinea unguium: Secondary | ICD-10-CM

## 2016-01-14 NOTE — Patient Instructions (Signed)
Diabetes and Foot Care Diabetes may cause you to have problems because of poor blood supply (circulation) to your feet and legs. This may cause the skin on your feet to become thinner, break easier, and heal more slowly. Your skin may become dry, and the skin may peel and crack. You may also have nerve damage in your legs and feet causing decreased feeling in them. You may not notice minor injuries to your feet that could lead to infections or more serious problems. Taking care of your feet is one of the most important things you can do for yourself.  HOME CARE INSTRUCTIONS  Wear shoes at all times, even in the house. Do not go barefoot. Bare feet are easily injured.  Check your feet daily for blisters, cuts, and redness. If you cannot see the bottom of your feet, use a mirror or ask someone for help.  Wash your feet with warm water (do not use hot water) and mild soap. Then pat your feet and the areas between your toes until they are completely dry. Do not soak your feet as this can dry your skin.  Apply a moisturizing lotion or petroleum jelly (that does not contain alcohol and is unscented) to the skin on your feet and to dry, brittle toenails. Do not apply lotion between your toes.  Trim your toenails straight across. Do not dig under them or around the cuticle. File the edges of your nails with an emery board or nail file.  Do not cut corns or calluses or try to remove them with medicine.  Wear clean socks or stockings every day. Make sure they are not too tight. Do not wear knee-high stockings since they may decrease blood flow to your legs.  Wear shoes that fit properly and have enough cushioning. To break in new shoes, wear them for just a few hours a day. This prevents you from injuring your feet. Always look in your shoes before you put them on to be sure there are no objects inside.  Do not cross your legs. This may decrease the blood flow to your feet.  If you find a minor scrape,  cut, or break in the skin on your feet, keep it and the skin around it clean and dry. These areas may be cleansed with mild soap and water. Do not cleanse the area with peroxide, alcohol, or iodine.  When you remove an adhesive bandage, be sure not to damage the skin around it.  If you have a wound, look at it several times a day to make sure it is healing.  Do not use heating pads or hot water bottles. They may burn your skin. If you have lost feeling in your feet or legs, you may not know it is happening until it is too late.  Make sure your health care provider performs a complete foot exam at least annually or more often if you have foot problems. Report any cuts, sores, or bruises to your health care provider immediately. SEEK MEDICAL CARE IF:   You have an injury that is not healing.  You have cuts or breaks in the skin.  You have an ingrown nail.  You notice redness on your legs or feet.  You feel burning or tingling in your legs or feet.  You have pain or cramps in your legs and feet.  Your legs or feet are numb.  Your feet always feel cold. SEEK IMMEDIATE MEDICAL CARE IF:   There is increasing redness,   swelling, or pain in or around a wound.  There is a red line that goes up your leg.  Pus is coming from a wound.  You develop a fever or as directed by your health care provider.  You notice a bad smell coming from an ulcer or wound.   This information is not intended to replace advice given to you by your health care provider. Make sure you discuss any questions you have with your health care provider.   Document Released: 03/19/2000 Document Revised: 11/22/2012 Document Reviewed: 08/29/2012 Elsevier Interactive Patient Education 2016 [employer]  

## 2016-01-14 NOTE — Progress Notes (Signed)
Patient ID: Christine Dominguez, female   DOB: Aug 03, 1950, 65 y.o.   MRN: [id number]    Subjective: This patient presents complaining of painful toenails and requesting nail debridement  Objective: Orientated 3 No open skin lesions bilaterally DP and PT pulses 1/4 bilaterally Capillary reflex immediate bilaterally Sensation to 10 g monofilament wire intact 5/5 bilaterally Vibratory sensation intact bilaterally Ankle reflex equal and reactive bilaterally Plantar callus fifth MPJ right HAV right Hammertoe third right The toenails are hypertrophic, elongated, incurvated, discolored and tender direct palpation 6-10 No open skin lesions bilaterally  Assessment: Insulin-dependent diabetic Decrease pedal pulses bilaterally Protective sensation intact bilaterally Symptomatic onychomycoses 6-10  Plan: Debridement toenails 10 mechanically an electrical without any bleeding Minimal debridement fifth MPJ callous without any  bleeding \ Reappoint 3 months

## 2016-01-16 ENCOUNTER — Other Ambulatory Visit: Payer: Self-pay

## 2016-01-16 MED ORDER — AMLODIPINE BESYLATE 5 MG PO TABS: 5.0000 mg | ORAL_TABLET | Freq: Every day | ORAL | 1 refills | Status: DC

## 2016-01-19 ENCOUNTER — Telehealth: Payer: Self-pay | Admitting: Family Medicine

## 2016-01-19 ENCOUNTER — Other Ambulatory Visit: Payer: Self-pay

## 2016-01-19 MED ORDER — SIMVASTATIN 10 MG PO TABS: 10.0000 mg | ORAL_TABLET | Freq: Every day | ORAL | 3 refills | Status: DC

## 2016-01-19 MED ORDER — AMLODIPINE BESYLATE 5 MG PO TABS: 5.0000 mg | ORAL_TABLET | Freq: Every day | ORAL | 1 refills | Status: DC

## 2016-01-19 NOTE — Telephone Encounter (Signed)
Christine Dominguez is asking for refills on amLODipine (NORVASC) 5 MG tablet and simvastatin (ZOCOR) 10 MG tablet and a Rx for a Blood Pressure Meter sent to [place]

## 2016-01-19 NOTE — Telephone Encounter (Signed)
Medications refilled

## 2016-01-26 ENCOUNTER — Other Ambulatory Visit: Payer: Self-pay | Admitting: "Endocrinology

## 2016-01-27 NOTE — Telephone Encounter (Signed)
I discussed with her during her last visit and she will need to be on 88 g of levothyroxine 3 morning.

## 2016-01-27 NOTE — Telephone Encounter (Signed)
The pt has requested a refill on Levothyroxine 75 mcg. The chart stets that the pt should be on 2mcg. Which dosage should she be taking?

## 2016-02-04 ENCOUNTER — Other Ambulatory Visit: Payer: Self-pay | Admitting: Family Medicine

## 2016-02-04 ENCOUNTER — Other Ambulatory Visit: Payer: Self-pay | Admitting: "Endocrinology

## 2016-02-04 ENCOUNTER — Other Ambulatory Visit: Payer: Self-pay | Admitting: Cardiology

## 2016-02-09 ENCOUNTER — Other Ambulatory Visit: Payer: Self-pay | Admitting: "Endocrinology

## 2016-03-08 ENCOUNTER — Other Ambulatory Visit: Payer: Self-pay | Admitting: Family Medicine

## 2016-03-08 ENCOUNTER — Telehealth: Payer: Self-pay

## 2016-03-08 MED ORDER — PENICILLIN V POTASSIUM 500 MG PO TABS: 500.0000 mg | ORAL_TABLET | Freq: Three times a day (TID) | ORAL | Status: DC

## 2016-03-08 MED ORDER — BENZONATATE 100 MG PO CAPS: 100.0000 mg | ORAL_CAPSULE | Freq: Two times a day (BID) | ORAL | 0 refills | Status: DC | PRN

## 2016-03-08 NOTE — Telephone Encounter (Signed)
Called and left message for patient to return call.  

## 2016-03-08 NOTE — Telephone Encounter (Signed)
I am sending in pen v and tessalon perles, for 1 week, needs to be seen in office in next 3 days also pls explain and schedule, I will work her in

## 2016-03-08 NOTE — Telephone Encounter (Signed)
Patient aware. And has an appt for 12/5

## 2016-03-09 ENCOUNTER — Encounter: Payer: Self-pay | Admitting: Family Medicine

## 2016-03-09 ENCOUNTER — Ambulatory Visit (INDEPENDENT_AMBULATORY_CARE_PROVIDER_SITE_OTHER): Payer: Commercial Managed Care - HMO | Admitting: Family Medicine

## 2016-03-09 ENCOUNTER — Telehealth: Payer: Self-pay | Admitting: Family Medicine

## 2016-03-09 VITALS — BP 146/72 | HR 98 | Resp 18 | Ht 61.0 in | Wt 168.1 lb

## 2016-03-09 DIAGNOSIS — E1122 Type 2 diabetes mellitus with diabetic chronic kidney disease: Secondary | ICD-10-CM | POA: Diagnosis not present

## 2016-03-09 DIAGNOSIS — E782 Mixed hyperlipidemia: Secondary | ICD-10-CM | POA: Diagnosis not present

## 2016-03-09 DIAGNOSIS — I10 Essential (primary) hypertension: Secondary | ICD-10-CM | POA: Diagnosis not present

## 2016-03-09 DIAGNOSIS — J209 Acute bronchitis, unspecified: Secondary | ICD-10-CM | POA: Diagnosis not present

## 2016-03-09 DIAGNOSIS — N183 Chronic kidney disease, stage 3 unspecified: Secondary | ICD-10-CM

## 2016-03-09 NOTE — Assessment & Plan Note (Signed)
Not at goal but fair control DASH diet and commitment to daily physical activity for a minimum of 30 minutes discussed and encouraged, as a part of hypertension management. The importance of attaining a healthy weight is also discussed.  BP/Weight 03/09/2016 01/14/2016 01/13/2016 01/05/2016 12/25/2015 12/18/2015 123456  Systolic BP 123456 [id number] 0000000 [id number] 0000000 [id number]  Diastolic BP 72 73 70 80 70 84 88  Wt. (Lbs) 168.08 - 167 167 166 166.8 168  BMI 31.76 - 31.55 31.55 30.86 31.52 31.74

## 2016-03-09 NOTE — Patient Instructions (Signed)
Wellness exam in January as before, call if you need me sooner  You are treated for acute bronchitis, continue medication that has been sent in  Thank you  for choosing [facility]. We consider it a privelige to serve you.  Delivering excellent health care in a caring and  compassionate way is our goal.  Partnering with you,  so that together we can achieve this goal is our strategy.

## 2016-03-09 NOTE — Progress Notes (Signed)
Christine Dominguez     MRN: [id number]      DOB: 09/27/1950   HPI Christine Dominguez  1 week h/o excessive chest congestion, cough scant spurtum Increasing malaise , poor sleep due to excessive cough, and reduced  appetite. Denies sinus pressure, nasal congestion or drainage,  ear pain or sore throat. No benefit from OTC medication used. Denies sinus pressure or nasal drainage, has had chills , no documented fever, penicillin and tessalon perles have been sent in, and she started this yesterday C/o early am hypoglycemia , despite uncontrolled blood sugars, explained need to supplement when bedtime sugar under 150 and to eat on regular schedule  ROS  Denies chest pains, palpitations and leg swelling Denies abdominal pain, nausea, vomiting,diarrhea or constipation.   Denies dysuria, frequency, hesitancy or incontinence. Denies joint pain, swelling and limitation in mobility. Denies headaches, seizures, numbness, or tingling. Denies depression, anxiety or insomnia. Denies skin break down or rash.   PE  BP (!) 146/72   Pulse 98   Resp 18   Ht 5\' 1"  (1.549 m)   Wt 168 lb 1.3 oz (76.2 kg)   SpO2 96%   BMI 31.76 kg/m   Patient alert and oriented and in no cardiopulmonary distress.  HEENT: No facial asymmetry, EOMI,   oropharynx pink and moist.  Neck supple no JVD, no mass.  Chest: adequate air entry, scattered crackles, no wheezes  CVS: S1, S2 no murmurs, no S3.Regular rate.  ABD: Soft non tender.   Ext: No edema  MS: Adequate ROM spine, shoulders, hips and knees.  Skin: Intact, no ulcerations or rash noted.  Psych: Good eye contact, normal affect. Memory intact not anxious or depressed appearing.  CNS: CN 2-12 intact, power,  normal throughout.no focal deficits noted.   Assessment & Plan  Acute bronchitis 1 week course of decongestant and antibiotic prescribed  Diabetes mellitus with stage 3 chronic kidney disease (HCC) Uncontrolled, management per  endo Christine Dominguez is reminded of the importance of commitment to daily physical activity for 30 minutes or more, as able and the need to limit carbohydrate intake to 30 to 60 grams per meal to help with blood sugar control.   The need to take medication as prescribed, test blood sugar as directed, and to call between visits if there is a concern that blood sugar is uncontrolled is also discussed.   Christine Dominguez is reminded of the importance of daily foot exam, annual eye examination, and good blood sugar, blood pressure and cholesterol control.  Diabetic Labs Latest Ref Rng & Units 01/06/2016 12/15/2015 12/10/2015 09/04/2015 08/12/2015  HbA1c <5.7 % - - 8.8(H) 8.6(H) -  Microalbumin Not estab mg/dL - - - - 1.3  Micro/Creat Ratio <30 mcg/mg creat - - - - 11  Chol 125 - 200 mg/dL 109(L) - - - -  HDL >=46 mg/dL 36(L) - - - -  Calc LDL <130 mg/dL 51 - - - -  Triglycerides <150 mg/dL 111 - - - -  Creatinine 0.50 - 0.99 mg/dL 1.42(H) 1.25(H) 1.38(H) 1.32(H) -   BP/Weight 03/09/2016 01/14/2016 01/13/2016 01/05/2016 12/25/2015 12/18/2015 123456  Systolic BP 123456 [id number]  Diastolic BP 72 73 70 80 70 84 88  Wt. (Lbs) 168.08 - 167 167 166 166.8 168  BMI 31.76 - 31.55 31.55 30.86 31.52 31.74   Foot/eye exam completion dates Latest Ref Rng & Units 11/25/2015 11/19/2014  Eye Exam No Retinopathy No Retinopathy No Retinopathy  Foot exam  Order - - -  Foot Form Completion - - -        Essential hypertension, benign Not at goal but fair control DASH diet and commitment to daily physical activity for a minimum of 30 minutes discussed and encouraged, as a part of hypertension management. The importance of attaining a healthy weight is also discussed.  BP/Weight 03/09/2016 01/14/2016 01/13/2016 01/05/2016 12/25/2015 12/18/2015 123456  Systolic BP 123456 [id number]  Diastolic BP 72 73 70 80 70 84 88  Wt. (Lbs) 168.08 - 167 167 166 166.8 168  BMI 31.76 - 31.55 31.55 30.86  31.52 31.74       Hyperlipidemia Hyperlipidemia:Low fat diet discussed and encouraged.   Lipid Panel  Lab Results  Component Value Date   CHOL 109 (L) 01/06/2016   HDL 36 (L) 01/06/2016   LDLCALC 51 01/06/2016   TRIG 111 01/06/2016   CHOLHDL 3.0 01/06/2016     No med change

## 2016-03-09 NOTE — Assessment & Plan Note (Signed)
1 week course of decongestant and antibiotic prescribed

## 2016-03-09 NOTE — Assessment & Plan Note (Signed)
Hyperlipidemia:Low fat diet discussed and encouraged.   Lipid Panel  Lab Results  Component Value Date   CHOL 109 (L) 01/06/2016   HDL 36 (L) 01/06/2016   LDLCALC 51 01/06/2016   TRIG 111 01/06/2016   CHOLHDL 3.0 01/06/2016     No med change

## 2016-03-09 NOTE — Assessment & Plan Note (Signed)
Uncontrolled, management per endo Christine Dominguez is reminded of the importance of commitment to daily physical activity for 30 minutes or more, as able and the need to limit carbohydrate intake to 30 to 60 grams per meal to help with blood sugar control.   The need to take medication as prescribed, test blood sugar as directed, and to call between visits if there is a concern that blood sugar is uncontrolled is also discussed.   Christine Dominguez is reminded of the importance of daily foot exam, annual eye examination, and good blood sugar, blood pressure and cholesterol control.  Diabetic Labs Latest Ref Rng & Units 01/06/2016 12/15/2015 12/10/2015 09/04/2015 08/12/2015  HbA1c <5.7 % - - 8.8(H) 8.6(H) -  Microalbumin Not estab mg/dL - - - - 1.3  Micro/Creat Ratio <30 mcg/mg creat - - - - 11  Chol 125 - 200 mg/dL 109(L) - - - -  HDL >=46 mg/dL 36(L) - - - -  Calc LDL <130 mg/dL 51 - - - -  Triglycerides <150 mg/dL 111 - - - -  Creatinine 0.50 - 0.99 mg/dL 1.42(H) 1.25(H) 1.38(H) 1.32(H) -   BP/Weight 03/09/2016 01/14/2016 01/13/2016 01/05/2016 12/25/2015 12/18/2015 123456  Systolic BP 123456 [id number]  Diastolic BP 72 73 70 80 70 84 88  Wt. (Lbs) 168.08 - 167 167 166 166.8 168  BMI 31.76 - 31.55 31.55 30.86 31.52 31.74   Foot/eye exam completion dates Latest Ref Rng & Units 11/25/2015 11/19/2014  Eye Exam No Retinopathy No Retinopathy No Retinopathy  Foot exam Order - - -  Foot Form Completion - - -

## 2016-03-12 ENCOUNTER — Telehealth: Payer: Self-pay | Admitting: Family Medicine

## 2016-03-12 NOTE — Telephone Encounter (Signed)
Patient is asking for a refill on omeprazole (PRILOSEC) 40 MG capsule   sent to [facility], please advise?

## 2016-03-15 ENCOUNTER — Other Ambulatory Visit: Payer: Self-pay

## 2016-03-15 MED ORDER — OMEPRAZOLE 40 MG PO CPDR: 40.0000 mg | DELAYED_RELEASE_CAPSULE | Freq: Every day | ORAL | 1 refills | Status: DC

## 2016-03-15 NOTE — Telephone Encounter (Signed)
Refill sent.

## 2016-03-17 ENCOUNTER — Other Ambulatory Visit: Payer: Self-pay | Admitting: "Endocrinology

## 2016-03-17 DIAGNOSIS — E785 Hyperlipidemia, unspecified: Secondary | ICD-10-CM | POA: Diagnosis not present

## 2016-03-17 DIAGNOSIS — N183 Chronic kidney disease, stage 3 (moderate): Secondary | ICD-10-CM | POA: Diagnosis not present

## 2016-03-17 DIAGNOSIS — E1122 Type 2 diabetes mellitus with diabetic chronic kidney disease: Secondary | ICD-10-CM | POA: Diagnosis not present

## 2016-03-17 DIAGNOSIS — E039 Hypothyroidism, unspecified: Secondary | ICD-10-CM | POA: Diagnosis not present

## 2016-03-17 LAB — COMPREHENSIVE METABOLIC PANEL
ALK PHOS: 64 U/L (ref 33–130)
ALT: 10 U/L (ref 6–29)
AST: 12 U/L (ref 10–35)
Albumin: 3.9 g/dL (ref 3.6–5.1)
BILIRUBIN TOTAL: 0.3 mg/dL (ref 0.2–1.2)
BUN: 22 mg/dL (ref 7–25)
CO2: 26 mmol/L (ref 20–31)
CREATININE: 1.52 mg/dL — AB (ref 0.50–0.99)
Calcium: 9.4 mg/dL (ref 8.6–10.4)
Chloride: 105 mmol/L (ref 98–110)
GLUCOSE: 176 mg/dL — AB (ref 65–99)
POTASSIUM: 4.1 mmol/L (ref 3.5–5.3)
Sodium: 138 mmol/L (ref 135–146)
TOTAL PROTEIN: 7.7 g/dL (ref 6.1–8.1)

## 2016-03-17 LAB — TSH: TSH: 1.33 m[IU]/L

## 2016-03-17 LAB — T4, FREE: Free T4: 1 ng/dL (ref 0.8–1.8)

## 2016-03-18 LAB — MICROALBUMIN / CREATININE URINE RATIO
Creatinine, Urine: 90 mg/dL (ref 20–320)
MICROALB/CREAT RATIO: 6 ug/mg{creat} (ref <30)
Microalb, Ur: 0.5 mg/dL

## 2016-03-18 LAB — HEMOGLOBIN A1C
Hgb A1c MFr Bld: 8.2 % — ABNORMAL HIGH (ref <5.7)
MEAN PLASMA GLUCOSE: 189 mg/dL

## 2016-03-24 ENCOUNTER — Ambulatory Visit (INDEPENDENT_AMBULATORY_CARE_PROVIDER_SITE_OTHER): Payer: Commercial Managed Care - HMO | Admitting: "Endocrinology

## 2016-03-24 ENCOUNTER — Encounter: Payer: Self-pay | Admitting: "Endocrinology

## 2016-03-24 VITALS — BP 140/80 | HR 82 | Resp 18 | Ht 61.0 in | Wt 172.0 lb

## 2016-03-24 DIAGNOSIS — N183 Chronic kidney disease, stage 3 unspecified: Secondary | ICD-10-CM

## 2016-03-24 DIAGNOSIS — E1122 Type 2 diabetes mellitus with diabetic chronic kidney disease: Secondary | ICD-10-CM

## 2016-03-24 DIAGNOSIS — E039 Hypothyroidism, unspecified: Secondary | ICD-10-CM | POA: Diagnosis not present

## 2016-03-24 DIAGNOSIS — E782 Mixed hyperlipidemia: Secondary | ICD-10-CM

## 2016-03-24 DIAGNOSIS — I10 Essential (primary) hypertension: Secondary | ICD-10-CM | POA: Diagnosis not present

## 2016-03-24 NOTE — Progress Notes (Signed)
Subjective:    Patient ID: Christine Dominguez, female    DOB: [birth year], PCP Tula Nakayama, MD   Past Medical History:  Diagnosis Date  . Anemia   . Arthritis    Cervical spine  . 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 Chronic back pain 2007   Disabled   . Essential hypertension, benign 1980  . GERD (gastroesophageal reflux disease) 2000  . Hydrocephalus    Treated with VP shunt and then intracranial tumor resection (?Cholesteatoma); no longer followed actively by neurosurgery or otolaryngology  . Hyperlipidemia 2013  . Hypothyroidism 2013  . Neuropathy ([place])   . Obesity   . Type 2 diabetes mellitus ([place]) 1991   Past Surgical History:  Procedure Laterality Date  . CATARACT EXTRACTION W/PHACO Right 07/17/2013   Procedure: CATARACT EXTRACTION PHACO AND INTRAOCULAR LENS PLACEMENT (IOC);  Surgeon: [name], MD;  Location: [facility];  Service: Ophthalmology;  Laterality: Right;  CDE:7.24  . COLONOSCOPY  06/2010  . Hemorrhoidectomy    . Intracranial mass resected  1998   At [facility]; ? location-right middle ear  . Teeth pulled    . TRIGGER FINGER RELEASE     Rght thumb  . TUBAL LIGATION  1980  . TUBAL LIGATION    . VENTRICULOPERITONEAL SHUNT  1998   At [place] History   Social History  . Marital status: Widowed    Spouse name: N/A  . Number of children: 3  . Years of education: N/A   Occupational History  . disabled     Social History Main Topics  . Smoking status: Former Smoker    Packs/day: 0.50    Years: 15.00    Types: Cigarettes    Quit date: 07/07/2001  . Smokeless tobacco: Never Used  . Alcohol use No  . Drug use: No  . Sexual activity: No     Comment: tubal   Other Topics Concern  . None   Social History Narrative   Mother of 3 - all children are deceased         Outpatient Encounter Prescriptions as of 03/24/2016  Medication Sig  . ACCU-CHEK FASTCLIX LANCETS MISC TEST  BLOOD SUGAR FOUR TIMES DAILY  . ACCU-CHEK SMARTVIEW test strip TEST 4 TIMES DAILY  . Alcohol Swabs (B-D SINGLE USE SWABS REGULAR) PADS TEST FOUR TIMES A DAY  . amLODipine (NORVASC) 5 MG tablet Take 1 tablet (5 mg total) by mouth daily.  Marland Kitchen aspirin EC 81 MG tablet Take 81 mg by mouth at bedtime.  . B-D ULTRAFINE III SHORT PEN 31G X 8 MM MISC USE  TO INJECT EVERY DAY  WITH  LANTUS  . benzonatate (TESSALON) 100 MG capsule Take 1 capsule (100 mg total) by mouth 2 (two) times daily as needed for cough.  . Blood Glucose Calibration (ACCU-CHEK SMARTVIEW CONTROL) LIQD USE AS DIRECTED  . cloNIDine (CATAPRES) 0.1 MG tablet TAKE 1 TABLET TWICE DAILY  . docusate sodium (STOOL SOFTENER) 100 MG capsule Take 100 mg by mouth as needed for mild constipation.  . gabapentin (NEURONTIN) 100 MG capsule TAKE 1 CAPSULE TWICE DAILY  . glucose (SUNMARK GLUCOSE) 4 GM chewable tablet Chew by mouth as needed.   . hydrochlorothiazide (HYDRODIURIL) 12.5 MG tablet TAKE 1 TABLET EVERY DAY  . Insulin Glargine (LANTUS SOLOSTAR) 100 UNIT/ML Solostar Pen Inject 44 Units into the skin at bedtime.  . Iron 66 MG TABS Take 1  tablet by mouth daily.   Marland Kitchen levothyroxine (SYNTHROID, LEVOTHROID) 88 MCG tablet TAKE 1 TABLET EVERY DAY BEFORE BREAKFAST  . nystatin (MYCOSTATIN/NYSTOP) powder APPLY TOPICALLY AT BEDTIME TO RASH ON LOWER ABDOMEN AS NEEDED  . omeprazole (PRILOSEC) 40 MG capsule Take 1 capsule (40 mg total) by mouth daily.  . Simethicone (GAS RELIEF DROPS PO) Take by mouth as needed.  . simvastatin (ZOCOR) 10 MG tablet Take 1 tablet (10 mg total) by mouth at bedtime.  . sodium chloride (OCEAN) 0.65 % nasal spray 1 spray as needed.   Marland Kitchen tiZANidine (ZANAFLEX) 2 MG tablet Take 1 tablet (2 mg total) by mouth daily as needed for muscle spasms.  . TRADJENTA 5 MG TABS tablet TAKE 1 TABLET EVERY DAY  . valsartan (DIOVAN) 320 MG tablet TAKE 1 TABLET EVERY DAY (DOSE INCREASE)  . Vitamin D, Ergocalciferol, (DRISDOL) 50000 units CAPS capsule  TAKE 1 CAPSULE EVERY 7 DAYS  . [DISCONTINUED] penicillin v potassium (VEETID) 500 MG tablet Take 1 tablet (500 mg total) by mouth 3 (three) times daily.   No facility-administered encounter medications on file as of 03/24/2016.    ALLERGIES: Allergies  Allergen Reactions  . Amaryl   . Avandia [Rosiglitazone Maleate]   . Glimepiride Itching  . Glipizide Itching  . Invokana [Canagliflozin]   . Lyrica [Pregabalin] Nausea And Vomiting  . Novolog [Insulin Aspart]   . Rosiglitazone     Other reaction(s): OTHER  . Sitagliptin     Other reaction(s): OTHER  . Aspirin Nausea Only and Other (See Comments)    Other Reaction: GI Upset; CAN TAKE 81 mg More of a burning feeling when she take it Burning in stomach, nausea   VACCINATION STATUS: Immunization History  Administered Date(s) Administered  . Influenza Split 02/15/2012  . Influenza Whole 01/06/2010  . Influenza,inj,Quad PF,36+ Mos 05/14/2013, 01/17/2014, 04/03/2015, 12/17/2015  . Pneumococcal Conjugate-13 08/08/2014  . Pneumococcal Polysaccharide-23 10/13/2009, 08/12/2015  . Tdap 08/24/2005    Diabetes  She presents for her follow-up diabetic visit. She has type 2 diabetes mellitus. Her disease course has been improving. There are no hypoglycemic associated symptoms. Pertinent negatives for hypoglycemia include no confusion, headaches, pallor or seizures. Associated symptoms include fatigue, polydipsia and polyuria. Pertinent negatives for diabetes include no chest pain and no polyphagia. There are no hypoglycemic complications. Symptoms are improving. Diabetic complications include nephropathy. Risk factors for coronary artery disease include dyslipidemia, diabetes mellitus and sedentary lifestyle. Current diabetic treatment includes insulin injections. She is compliant with treatment some of the time. Her weight is stable. She is following a generally unhealthy diet. When asked about meal planning, she reported none. She has had a  previous visit with a dietitian. She rarely participates in exercise. Her home blood glucose trend is decreasing steadily. Her breakfast blood glucose range is generally 140-180 mg/dl. Her overall blood glucose range is 140-180 mg/dl. (She denies hypoglycemia, but she worries about it . ) An ACE inhibitor/angiotensin II receptor blocker is being taken.  Hyperlipidemia  This is a chronic problem. The current episode started more than 1 year ago. Exacerbating diseases include diabetes, hypothyroidism and obesity. Pertinent negatives include no chest pain, myalgias or shortness of breath. Current antihyperlipidemic treatment includes statins.  Hypertension  This is a chronic problem. The current episode started more than 1 year ago. Pertinent negatives include no chest pain, headaches, palpitations or shortness of breath. Risk factors for coronary artery disease include diabetes mellitus, dyslipidemia, obesity and sedentary lifestyle.      Review of  Systems  Constitutional: Positive for fatigue. Negative for chills, fever and unexpected weight change.  HENT: Negative for trouble swallowing and voice change.   Eyes: Negative for visual disturbance.  Respiratory: Negative for cough, shortness of breath and wheezing.   Cardiovascular: Negative for chest pain, palpitations and leg swelling.  Gastrointestinal: Negative for diarrhea, nausea and vomiting.  Endocrine: Positive for polydipsia and polyuria. Negative for cold intolerance, heat intolerance and polyphagia.  Musculoskeletal: Negative for arthralgias and myalgias.  Skin: Negative for color change, pallor, rash and wound.  Neurological: Negative for seizures and headaches.  Psychiatric/Behavioral: Negative for confusion and suicidal ideas.    Objective:    BP 140/80   Pulse 82   Resp 18   Ht 5\' 1"  (1.549 m)   Wt 172 lb (78 kg)   SpO2 99%   BMI 32.50 kg/m   Wt Readings from Last 3 Encounters:  03/24/16 172 lb (78 kg)  03/09/16 168 lb  1.3 oz (76.2 kg)  01/13/16 167 lb (75.8 kg)    Physical Exam  Constitutional: She is oriented to person, place, and time. She appears well-developed.  HENT:  Head: Normocephalic and atraumatic.  Eyes: EOM are normal.  Neck: Normal range of motion. Neck supple. No tracheal deviation present. No thyromegaly present.  Cardiovascular: Normal rate and regular rhythm.   Pulmonary/Chest: Effort normal and breath sounds normal.  Abdominal: Soft. Bowel sounds are normal. There is no tenderness. There is no guarding.  Musculoskeletal: Normal range of motion. She exhibits no edema.  Neurological: She is alert and oriented to person, place, and time. She has normal reflexes. No cranial nerve deficit. Coordination normal.  Skin: Skin is warm and dry. No rash noted. No erythema. No pallor.  Psychiatric: She has a normal mood and affect. Judgment normal.    Results for orders placed or performed in visit on 03/17/16  Microalbumin / creatinine urine ratio  Result Value Ref Range   Creatinine, Urine 90 20 - 320 mg/dL   Microalb, Ur 0.5 Not estab mg/dL   Microalb Creat Ratio 6 <30 mcg/mg creat  Comprehensive metabolic panel  Result Value Ref Range   Sodium 138 135 - 146 mmol/L   Potassium 4.1 3.5 - 5.3 mmol/L   Chloride 105 98 - 110 mmol/L   CO2 26 20 - 31 mmol/L   Glucose, Bld 176 (H) 65 - 99 mg/dL   BUN 22 7 - 25 mg/dL   Creat 1.52 (H) 0.50 - 0.99 mg/dL   Total Bilirubin 0.3 0.2 - 1.2 mg/dL   Alkaline Phosphatase 64 33 - 130 U/L   AST 12 10 - 35 U/L   ALT 10 6 - 29 U/L   Total Protein 7.7 6.1 - 8.1 g/dL   Albumin 3.9 3.6 - 5.1 g/dL   Calcium 9.4 8.6 - 10.4 mg/dL  TSH  Result Value Ref Range   TSH 1.33 mIU/L  T4, free  Result Value Ref Range   Free T4 1.0 0.8 - 1.8 ng/dL  Hemoglobin A1c  Result Value Ref Range   Hgb A1c MFr Bld 8.2 (H) <5.7 %   Mean Plasma Glucose 189 mg/dL   Diabetic Labs (most recent): Lab Results  Component Value Date   HGBA1C 8.2 (H) 03/17/2016   HGBA1C 8.8  (H) 12/10/2015   HGBA1C 8.6 (H) 09/04/2015   Lipid Panel     Component Value Date/Time   CHOL 109 (L) 01/06/2016 0723   TRIG 111 01/06/2016 0723   HDL 36 (L) 01/06/2016  0723   CHOLHDL 3.0 01/06/2016 0723   VLDL 22 01/06/2016 0723   LDLCALC 51 01/06/2016 0723     Assessment & Plan:   1. Diabetes mellitus with stage 3 chronic kidney disease ([place]) -She remains at a high risk for more acute and chronic complications of diabetes which include CAD, CVA, CKD, retinopathy, and neuropathy. These are all discussed in detail with the patient.  Patient came with slightly above target glucose profile, and  recent A1c of  8.2% improving from 8.8%.   Glucose logs and insulin administration records pertaining to this visit,  to be scanned into patient's records.  Recent labs reviewed.   - I have re-counseled the patient on diet management andweight loss  by adopting a carbohydrate restricted / protein rich  Diet.  - Suggestion is made for patient to avoid simple carbohydrates   from their diet including Cakes , Desserts, Ice Cream,  Soda (  diet and regular) , Sweet Tea , Candies,  Chips, Cookies, Artificial Sweeteners,   and "Sugar-free" Products .  This will help patient to have stable blood glucose profile and potentially avoid unintended  Weight gain.  - Patient is advised to stick to a routine mealtimes to eat 3 meals  a day and avoid unnecessary snacks ( to snack only to correct hypoglycemia).  - The patient will be  scheduled with Jearld Fenton, RDN, CDE for individualized DM education.  - I have approached patient with the following individualized plan to manage diabetes and patient agrees.  She does not want to tighten control ,  due to fear of hypoglycemia.  - She reluctantly accepts to increase her Lantus to 44 units  every morning with breakfast associated with monitoring her blood glucose twice a day, before breakfast and at bedtime. -Continue Tradjenta 5 mg by mouth every morning.    - She did not like Invokana .  - Patient specific target  for A1c; LDL, HDL, Triglycerides, and  Waist Circumference were discussed in detail.  2) BP/HTN: Controlled. Continue current medications including ACEI/ARB. 3) Lipids/HPL:  continue statins. 4)  Weight/Diet: CDE consult in progress, exercise, and carbohydrates information provided.  5) hypothyroidism:  her last thyroid function tests are consistent with appropriate replacement. I will T Levothyroxine 88 g by mouth every morning.   - We discussed about correct intake of levothyroxine, at fasting, with water, separated by at least 30 minutes from breakfast, and separated by more than 4 hours from calcium, iron, multivitamins, acid reflux medications (PPIs). -Patient is made aware of the fact that thyroid hormone replacement is needed for life, dose to be adjusted by periodic monitoring of thyroid function tests.  6) Chronic Care/Health Maintenance:  -Patient is on ACEI/ARB and Statin medications and encouraged to continue to follow up with Ophthalmology, Podiatrist at least yearly or according to recommendations, and advised to  stay away from smoking. I have recommended yearly flu vaccine and pneumonia vaccination at least every 5 years; moderate intensity exercise for up to 150 minutes weekly; and  sleep for at least 7 hours a day.  - 25 minutes of time was spent on the care of this patient , 50% of which was applied for counseling on diabetes complications and their preventions.  - I advised patient to maintain close follow up with Tula Nakayama, MD for primary care needs.  Patient is asked to bring meter and  blood glucose logs during their next visit.   Follow up plan: -Return in about 3 months (  around 06/22/2016) for follow up with pre-visit labs, meter, and logs.  Glade Lloyd, MD Phone: [phone]  Fax: [phone]   03/24/2016, 10:31 AM

## 2016-04-01 ENCOUNTER — Emergency Department (HOSPITAL_COMMUNITY)
Admission: EM | Admit: 2016-04-01 | Discharge: 2016-04-01 | Disposition: A | Discharge status: Home / Self Care | Payer: Commercial Managed Care - HMO | Attending: Emergency Medicine | Admitting: Emergency Medicine

## 2016-04-01 ENCOUNTER — Encounter (HOSPITAL_COMMUNITY): Payer: Self-pay | Admitting: *Deleted

## 2016-04-01 DIAGNOSIS — I129 Hypertensive chronic kidney disease with stage 1 through stage 4 chronic kidney disease, or unspecified chronic kidney disease: Secondary | ICD-10-CM | POA: Diagnosis not present

## 2016-04-01 DIAGNOSIS — Z87891 Personal history of nicotine dependence: Secondary | ICD-10-CM | POA: Insufficient documentation

## 2016-04-01 DIAGNOSIS — E1165 Type 2 diabetes mellitus with hyperglycemia: Secondary | ICD-10-CM | POA: Diagnosis not present

## 2016-04-01 DIAGNOSIS — R110 Nausea: Secondary | ICD-10-CM | POA: Insufficient documentation

## 2016-04-01 DIAGNOSIS — R197 Diarrhea, unspecified: Secondary | ICD-10-CM | POA: Insufficient documentation

## 2016-04-01 DIAGNOSIS — N183 Chronic kidney disease, stage 3 (moderate): Secondary | ICD-10-CM | POA: Diagnosis not present

## 2016-04-01 DIAGNOSIS — E039 Hypothyroidism, unspecified: Secondary | ICD-10-CM | POA: Insufficient documentation

## 2016-04-01 DIAGNOSIS — Z794 Long term (current) use of insulin: Secondary | ICD-10-CM | POA: Diagnosis not present

## 2016-04-01 DIAGNOSIS — N189 Chronic kidney disease, unspecified: Secondary | ICD-10-CM | POA: Diagnosis not present

## 2016-04-01 DIAGNOSIS — R1013 Epigastric pain: Secondary | ICD-10-CM | POA: Diagnosis not present

## 2016-04-01 DIAGNOSIS — Z7982 Long term (current) use of aspirin: Secondary | ICD-10-CM | POA: Insufficient documentation

## 2016-04-01 DIAGNOSIS — E1122 Type 2 diabetes mellitus with diabetic chronic kidney disease: Secondary | ICD-10-CM | POA: Insufficient documentation

## 2016-04-01 LAB — COMPREHENSIVE METABOLIC PANEL
ALK PHOS: 64 U/L (ref 38–126)
ALT: 17 U/L (ref 14–54)
AST: 15 U/L (ref 15–41)
Albumin: 4 g/dL (ref 3.5–5.0)
Anion gap: 6 (ref 5–15)
BUN: 16 mg/dL (ref 6–20)
CALCIUM: 9.1 mg/dL (ref 8.9–10.3)
CO2: 28 mmol/L (ref 22–32)
CREATININE: 1.32 mg/dL — AB (ref 0.44–1.00)
Chloride: 98 mmol/L — ABNORMAL LOW (ref 101–111)
GFR, EST AFRICAN AMERICAN: 48 mL/min — AB (ref >60)
GFR, EST NON AFRICAN AMERICAN: 42 mL/min — AB (ref >60)
Glucose, Bld: 249 mg/dL — ABNORMAL HIGH (ref 65–99)
Potassium: 3.8 mmol/L (ref 3.5–5.1)
Sodium: 132 mmol/L — ABNORMAL LOW (ref 135–145)
Total Bilirubin: 0.4 mg/dL (ref 0.3–1.2)
Total Protein: 7.9 g/dL (ref 6.5–8.1)

## 2016-04-01 LAB — CBC
HCT: 34 % — ABNORMAL LOW (ref 36.0–46.0)
Hemoglobin: 10.9 g/dL — ABNORMAL LOW (ref 12.0–15.0)
MCH: 27.5 pg (ref 26.0–34.0)
MCHC: 32.1 g/dL (ref 30.0–36.0)
MCV: 85.6 fL (ref 78.0–100.0)
PLATELETS: 213 10*3/uL (ref 150–400)
RBC: 3.97 MIL/uL (ref 3.87–5.11)
RDW: 13.5 % (ref 11.5–15.5)
WBC: 9.7 10*3/uL (ref 4.0–10.5)

## 2016-04-01 LAB — CBG MONITORING, ED
GLUCOSE-CAPILLARY: 216 mg/dL — AB (ref 65–99)
GLUCOSE-CAPILLARY: 237 mg/dL — AB (ref 65–99)

## 2016-04-01 LAB — LIPASE, BLOOD: LIPASE: 38 U/L (ref 11–51)

## 2016-04-01 MED ORDER — METOCLOPRAMIDE HCL 10 MG PO TABS: 5.0000 mg | ORAL_TABLET | Freq: Four times a day (QID) | ORAL | 0 refills | Status: DC | PRN

## 2016-04-01 NOTE — ED Triage Notes (Signed)
Pt comes in with diarrhea and nausea starting around 1100 today. Pt denies any vomiting. States she had an episode of epigastric pain earlier but no longer is in pain. NAD noted.

## 2016-04-01 NOTE — ED Provider Notes (Signed)
[facility] DEPT Provider Note   CSN: [id number] Arrival date & time: 04/01/16  1512     History   Chief Complaint Chief Complaint  Patient presents with  . Diarrhea    HPI Christine Dominguez is a 65 y.o. female.Patient reports nausea onset. 10 AM today accompanied by mild epigastric pain and 3 episodes of diarrhea. She is presently asymptomatic. She reports no vomiting no fever, no lightheadedness no other associated symptoms. No recent travel. She completed penicillin 2 weeks ago . She treated herself with "gas pills" which improved her symptoms she feels much improved over earlier today. Denies any nausea or or abdominal discomfort presently    HPI  Past Medical History:  Diagnosis Date  . Anemia   . Arthritis    Cervical spine  . Cancer ([place]) 1998, recurred in 2012   Paraganglioma of jugular tympamicum, recurrent in 2012  . Chronic back pain 2007   Disabled   . Essential hypertension, benign 1980  . GERD (gastroesophageal reflux disease) 2000  . Hydrocephalus    Treated with VP shunt and then intracranial tumor resection (?Cholesteatoma); no longer followed actively by neurosurgery or otolaryngology  . Hyperlipidemia 2013  . Hypothyroidism 2013  . Neuropathy ([place])   . Obesity   . Type 2 diabetes mellitus ([place]) 1991    Patient Active Problem List   Diagnosis Date Noted  . Rectocele 12/31/2013  . Acute bronchitis 12/12/2013  . Paraganglioma ([place]) 08/01/2013  . Primary hypothyroidism 11/14/2011  . Hyperlipidemia 11/14/2011  . GERD (gastroesophageal reflux disease) 11/03/2011  . Hearing loss in right ear 11/24/2010  . Diabetes mellitus with stage 3 chronic kidney disease ([place]) 06/11/2010  . Backache 11/18/2009  . CARPAL TUNNEL SYNDROME 10/19/2009  . Essential hypertension, benign 10/13/2009    Past Surgical History:  Procedure Laterality Date  . CATARACT EXTRACTION W/PHACO Right 07/17/2013   Procedure: CATARACT EXTRACTION PHACO AND INTRAOCULAR LENS  PLACEMENT (IOC);  Surgeon: [name], MD;  Location: [facility];  Service: Ophthalmology;  Laterality: Right;  CDE:7.24  . COLONOSCOPY  06/2010  . Hemorrhoidectomy    . Intracranial mass resected  1998   At [facility]; ? location-right middle ear  . Teeth pulled    . TRIGGER FINGER RELEASE     Rght thumb  . TUBAL LIGATION  1980  . TUBAL LIGATION    . VENTRICULOPERITONEAL SHUNT  1998   At [facility]    OB History    Gravida Para Term Preterm AB Living   3 2     1 1    SAB TAB Ectopic Multiple Live Births       1   1       Home Medications    Prior to Admission medications   Medication Sig Start Date End Date Taking? Authorizing Provider  ACCU-CHEK FASTCLIX LANCETS MISC TEST BLOOD SUGAR FOUR TIMES DAILY 01/01/16   Cassandria Anger, MD  ACCU-CHEK SMARTVIEW test strip TEST 4 TIMES DAILY 01/27/16   Cassandria Anger, MD  Alcohol Swabs (B-D SINGLE USE SWABS REGULAR) PADS TEST FOUR TIMES A DAY 07/07/15   Cassandria Anger, MD  amLODipine (NORVASC) 5 MG tablet Take 1 tablet (5 mg total) by mouth daily. 01/19/16   [name], MD  aspirin EC 81 MG tablet Take 81 mg by mouth at bedtime.    Historical Provider, MD  B-D ULTRAFINE III SHORT PEN 31G X 8 MM MISC USE  TO INJECT EVERY DAY  WITH  LANTUS 09/15/15  Cassandria Anger, MD  benzonatate (TESSALON) 100 MG capsule Take 1 capsule (100 mg total) by mouth 2 (two) times daily as needed for cough. 03/08/16   [name], MD  Blood Glucose Calibration (ACCU-CHEK SMARTVIEW CONTROL) LIQD USE AS DIRECTED 02/10/16   Cassandria Anger, MD  cloNIDine (CATAPRES) 0.1 MG tablet TAKE 1 TABLET TWICE DAILY 02/05/16   Satira Sark, MD  docusate sodium (STOOL SOFTENER) 100 MG capsule Take 100 mg by mouth as needed for mild constipation.    Historical Provider, MD  gabapentin (NEURONTIN) 100 MG capsule TAKE 1 CAPSULE TWICE DAILY 08/12/15   [name], MD  glucose [facility] GLUCOSE) 4 GM chewable tablet Chew by mouth as needed.  07/29/11    Historical Provider, MD  hydrochlorothiazide (HYDRODIURIL) 12.5 MG tablet TAKE 1 TABLET EVERY DAY 08/11/15   Satira Sark, MD  Insulin Glargine (LANTUS SOLOSTAR) 100 UNIT/ML Solostar Pen Inject 44 Units into the skin at bedtime. 12/18/15   Cassandria Anger, MD  Iron 66 MG TABS Take 1 tablet by mouth daily.     Historical Provider, MD  levothyroxine (SYNTHROID, LEVOTHROID) 88 MCG tablet TAKE 1 TABLET EVERY DAY BEFORE BREAKFAST 02/05/16   Cassandria Anger, MD  nystatin (MYCOSTATIN/NYSTOP) powder APPLY TOPICALLY AT BEDTIME TO RASH ON LOWER ABDOMEN AS NEEDED 01/06/16   [name], MD  omeprazole (PRILOSEC) 40 MG capsule Take 1 capsule (40 mg total) by mouth daily. 03/15/16   [name], MD  Simethicone (GAS RELIEF DROPS PO) Take by mouth as needed.    Historical Provider, MD  simvastatin (ZOCOR) 10 MG tablet Take 1 tablet (10 mg total) by mouth at bedtime. 01/19/16   [name], MD  sodium chloride (OCEAN) 0.65 % nasal spray 1 spray as needed.  12/04/13   Historical Provider, MD  tiZANidine (ZANAFLEX) 2 MG tablet Take 1 tablet (2 mg total) by mouth daily as needed for muscle spasms. 12/12/15   [name], MD  TRADJENTA 5 MG TABS tablet TAKE 1 TABLET EVERY DAY 02/05/16   Cass[place]ria Anger, MD  valsartan (DIOVAN) 320 MG tablet TAKE 1 TABLET EVERY DAY (DOSE INCREASE) 11/27/15   [name], MD  Vitamin D, Ergocalciferol, (DRISDOL) 50000 units CAPS capsule TAKE 1 CAPSULE EVERY 7 DAYS 01/09/16   [name], MD    Family History Family History  Problem Relation Age of Onset  . Lung cancer Mother   . Hypertension Mother 55  . Hypertension Brother   . Diabetes Brother   . Diabetes Sister   . Kidney failure Brother   . Hypertension Maternal Grandmother   . Heart disease Maternal Grandmother   . Other Son     stillborn  . Other Son     died at birth  . Diabetes Maternal Grandfather   . Other Son     was murdered    Social History Social  History  Substance Use Topics  . Smoking status: Former Smoker    Packs/day: 0.50    Years: 15.00    Types: Cigarettes    Quit date: 07/07/2001  . Smokeless tobacco: Never Used  . Alcohol use No     Allergies   Amaryl; Avandia [rosiglitazone maleate]; Glimepiride; Glipizide; Invokana [canagliflozin]; Lyrica [pregabalin]; Novolog [insulin aspart]; Rosiglitazone; Sitagliptin; and Aspirin   Review of Systems Review of Systems  Constitutional: Negative.   HENT: Negative.   Respiratory: Negative.   Cardiovascular: Negative.   Gastrointestinal: Positive for abdominal  pain, diarrhea and nausea.  Musculoskeletal: Negative.   Skin: Negative.   Allergic/Immunologic: Positive for immunocompromised state.       Diabetic  Neurological: Negative.   Psychiatric/Behavioral: Negative.      Physical Exam Updated Vital Signs BP 179/67 (BP Location: Left Arm)   Pulse 88   Temp 97.7 F (36.5 C) (Oral)   Resp 16   Ht 5\' 1"  (1.549 m)   Wt 170 lb (77.1 kg)   SpO2 100%   BMI 32.12 kg/m   Physical Exam  Constitutional: She appears well-developed and well-nourished.  HENT:  Head: Normocephalic and atraumatic.  Eyes: Conjunctivae are normal. Pupils are equal, round, and reactive to light.  Neck: Neck supple. No tracheal deviation present. No thyromegaly present.  Cardiovascular: Normal rate and regular rhythm.   No murmur heard. Pulmonary/Chest: Effort normal and breath sounds normal.  Abdominal: Soft. Bowel sounds are normal. She exhibits no distension. There is no tenderness.  Obese  Musculoskeletal: Normal range of motion. She exhibits no edema or tenderness.  Neurological: She is alert. Coordination normal.  Skin: Skin is warm and dry. No rash noted.  Psychiatric: She has a normal mood and affect.  Nursing note and vitals reviewed.    ED Treatments / Results  Labs (all labs ordered are listed, but only abnormal results are displayed) Labs Reviewed  COMPREHENSIVE METABOLIC  PANEL - Abnormal; Notable for the following:       Result Value   Sodium 132 (*)    Chloride 98 (*)    Glucose, Bld 249 (*)    Creatinine, Ser 1.32 (*)    GFR calc non Af Amer 42 (*)    GFR calc Af Amer 48 (*)    All other components within normal limits  CBC - Abnormal; Notable for the following:    Hemoglobin 10.9 (*)    HCT 34.0 (*)    All other components within normal limits  CBG MONITORING, ED - Abnormal; Notable for the following:    Glucose-Capillary 216 (*)    All other components within normal limits  CBG MONITORING, ED - Abnormal; Notable for the following:    Glucose-Capillary 237 (*)    All other components within normal limits  LIPASE, BLOOD  URINALYSIS, ROUTINE W REFLEX MICROSCOPIC   Results for orders placed or performed during the hospital encounter of 04/01/16  Lipase, blood  Result Value Ref Range   Lipase 38 11 - 51 U/L  Comprehensive metabolic panel  Result Value Ref Range   Sodium 132 (L) 135 - 145 mmol/L   Potassium 3.8 3.5 - 5.1 mmol/L   Chloride 98 (L) 101 - 111 mmol/L   CO2 28 22 - 32 mmol/L   Glucose, Bld 249 (H) 65 - 99 mg/dL   BUN 16 6 - 20 mg/dL   Creatinine, Ser 1.32 (H) 0.44 - 1.00 mg/dL   Calcium 9.1 8.9 - 10.3 mg/dL   Total Protein 7.9 6.5 - 8.1 g/dL   Albumin 4.0 3.5 - 5.0 g/dL   AST 15 15 - 41 U/L   ALT 17 14 - 54 U/L   Alkaline Phosphatase 64 38 - 126 U/L   Total Bilirubin 0.4 0.3 - 1.2 mg/dL   GFR calc non Af Amer 42 (L) >60 mL/min   GFR calc Af Amer 48 (L) >60 mL/min   Anion gap 6 5 - 15  CBC  Result Value Ref Range   WBC 9.7 4.0 - 10.5 K/uL   RBC 3.97 3.87 -  5.11 MIL/uL   Hemoglobin 10.9 (L) 12.0 - 15.0 g/dL   HCT 34.0 (L) 36.0 - 46.0 %   MCV 85.6 78.0 - 100.0 fL   MCH 27.5 26.0 - 34.0 pg   MCHC 32.1 30.0 - 36.0 g/dL   RDW 13.5 11.5 - 15.5 %   Platelets 213 150 - 400 K/uL  CBG monitoring, ED  Result Value Ref Range   Glucose-Capillary 216 (H) 65 - 99 mg/dL  CBG monitoring, ED  Result Value Ref Range    Glucose-Capillary 237 (H) 65 - 99 mg/dL   No results found. EKG  EKG Interpretation None       Radiology No results found.  Procedures Procedures (including critical care time)  Medications Ordered in ED Medications - No data to display   Initial Impression / Assessment and Plan / ED Course  I have reviewed the triage vital signs and the nursing notes.  Pertinent labs & imaging results that were available during my care of the patient were reviewed by me and considered in my medical decision making (see chart for details). Patient has mild renal insufficiency which is chronic. She feels ready for discharge at 8:50 PM and is resting comfortably Clinical Course     Suspect viral illness. Plan avoid dairy. Imodium for diarrhea. Prescription Reglan. Encourage oral hydration. Blood pressure recheck 3 weeks Final Clinical Impressions(s) / ED Diagnoses  Diagnosis #1 epigastric abdominal pain #2 diarrhea #3 hyperglycemia #4 elevated blood pressure #5 chronic renal insufficiency Final diagnoses:  None    New Prescriptions New Prescriptions   No medications on file     Orlie Dakin, MD 04/01/16 2058

## 2016-04-01 NOTE — Discharge Instructions (Signed)
Drink at least six 8 ounce glasses of water each day in order to stay well-hydrated. Avoid milk or foods containing milk such as cheese or is having diarrhea. You can take Imodium as directed for diarrhea. Take the medication prescribed as needed for nausea. Return if feeling worse for any reason or contact Dr. Moshe Cipro. Your blood sugar tonight was mildly elevated at 249. Get your blood pressure recheck within the next 3 weeks. Today's was mildly elevated at 158/78.

## 2016-04-02 NOTE — Telephone Encounter (Signed)
No additional documentation needed.

## 2016-04-06 ENCOUNTER — Other Ambulatory Visit: Payer: Self-pay | Admitting: Family Medicine

## 2016-04-07 ENCOUNTER — Encounter: Payer: Commercial Managed Care - HMO | Admitting: Family Medicine

## 2016-04-09 ENCOUNTER — Other Ambulatory Visit: Payer: Self-pay | Admitting: "Endocrinology

## 2016-04-09 ENCOUNTER — Other Ambulatory Visit: Payer: Self-pay | Admitting: Family Medicine

## 2016-04-12 MED ORDER — LEVOTHYROXINE SODIUM 88 MCG PO TABS: ORAL_TABLET | ORAL | 0 refills | Status: DC

## 2016-04-14 ENCOUNTER — Ambulatory Visit (INDEPENDENT_AMBULATORY_CARE_PROVIDER_SITE_OTHER): Payer: Commercial Managed Care - HMO | Admitting: Podiatry

## 2016-04-14 ENCOUNTER — Encounter: Payer: Self-pay | Admitting: Podiatry

## 2016-04-14 VITALS — BP 143/81 | HR 82 | Resp 18

## 2016-04-14 DIAGNOSIS — B351 Tinea unguium: Secondary | ICD-10-CM | POA: Diagnosis not present

## 2016-04-14 DIAGNOSIS — M79676 Pain in unspecified toe(s): Secondary | ICD-10-CM | POA: Diagnosis not present

## 2016-04-14 NOTE — Progress Notes (Signed)
Patient ID: Christine Dominguez, female   DOB: Aug 06, 1950, 66 y.o.   MRN: [id number]    Subjective: This patient presents complaining of painful toenails and requesting nail debridement  Objective: Orientated 3 No open skin lesions bilaterally DP and PT pulses 1/4 bilaterally Capillary reflex immediate bilaterally Sensation to 10 g monofilament wire intact 5/5 bilaterally Vibratory sensation intact bilaterally Ankle reflex equal and reactive bilaterally Plantar callus fifth MPJ -minimally bilaterally HAV right Hammertoe third right The toenails are hypertrophic, elongated, incurvated, discolored and tender direct palpation 6-10 No open skin lesions bilaterally  Assessment: Insulin-dependent diabetic Decrease pedal pulses bilaterally Protective sensation intact bilaterally Symptomatic onychomycoses 6-10  Plan: Debridement toenails 10 mechanically an electrical without any bleeding Minimal debridement fifth MPJ callous without any  bleeding \ Reappoint 3 months

## 2016-04-14 NOTE — Patient Instructions (Signed)

## 2016-04-15 ENCOUNTER — Other Ambulatory Visit: Payer: Self-pay

## 2016-04-15 MED ORDER — ACCU-CHEK NANO SMARTVIEW W/DEVICE KIT: PACK | 0 refills | Status: DC

## 2016-04-20 ENCOUNTER — Ambulatory Visit: Payer: Commercial Managed Care - HMO

## 2016-04-20 ENCOUNTER — Ambulatory Visit: Payer: Commercial Managed Care - HMO | Admitting: Family Medicine

## 2016-04-26 IMAGING — DX DG CHEST 2V
2 series · 2 of 2 positions shown · non-contrast
Comparison: Chest radiograph performed 06/07/2015

CLINICAL DATA: Acute onset of generalized weakness, dizziness and
lightheadedness. Initial encounter.

EXAM:
CHEST  2 VIEW

[chest pa]
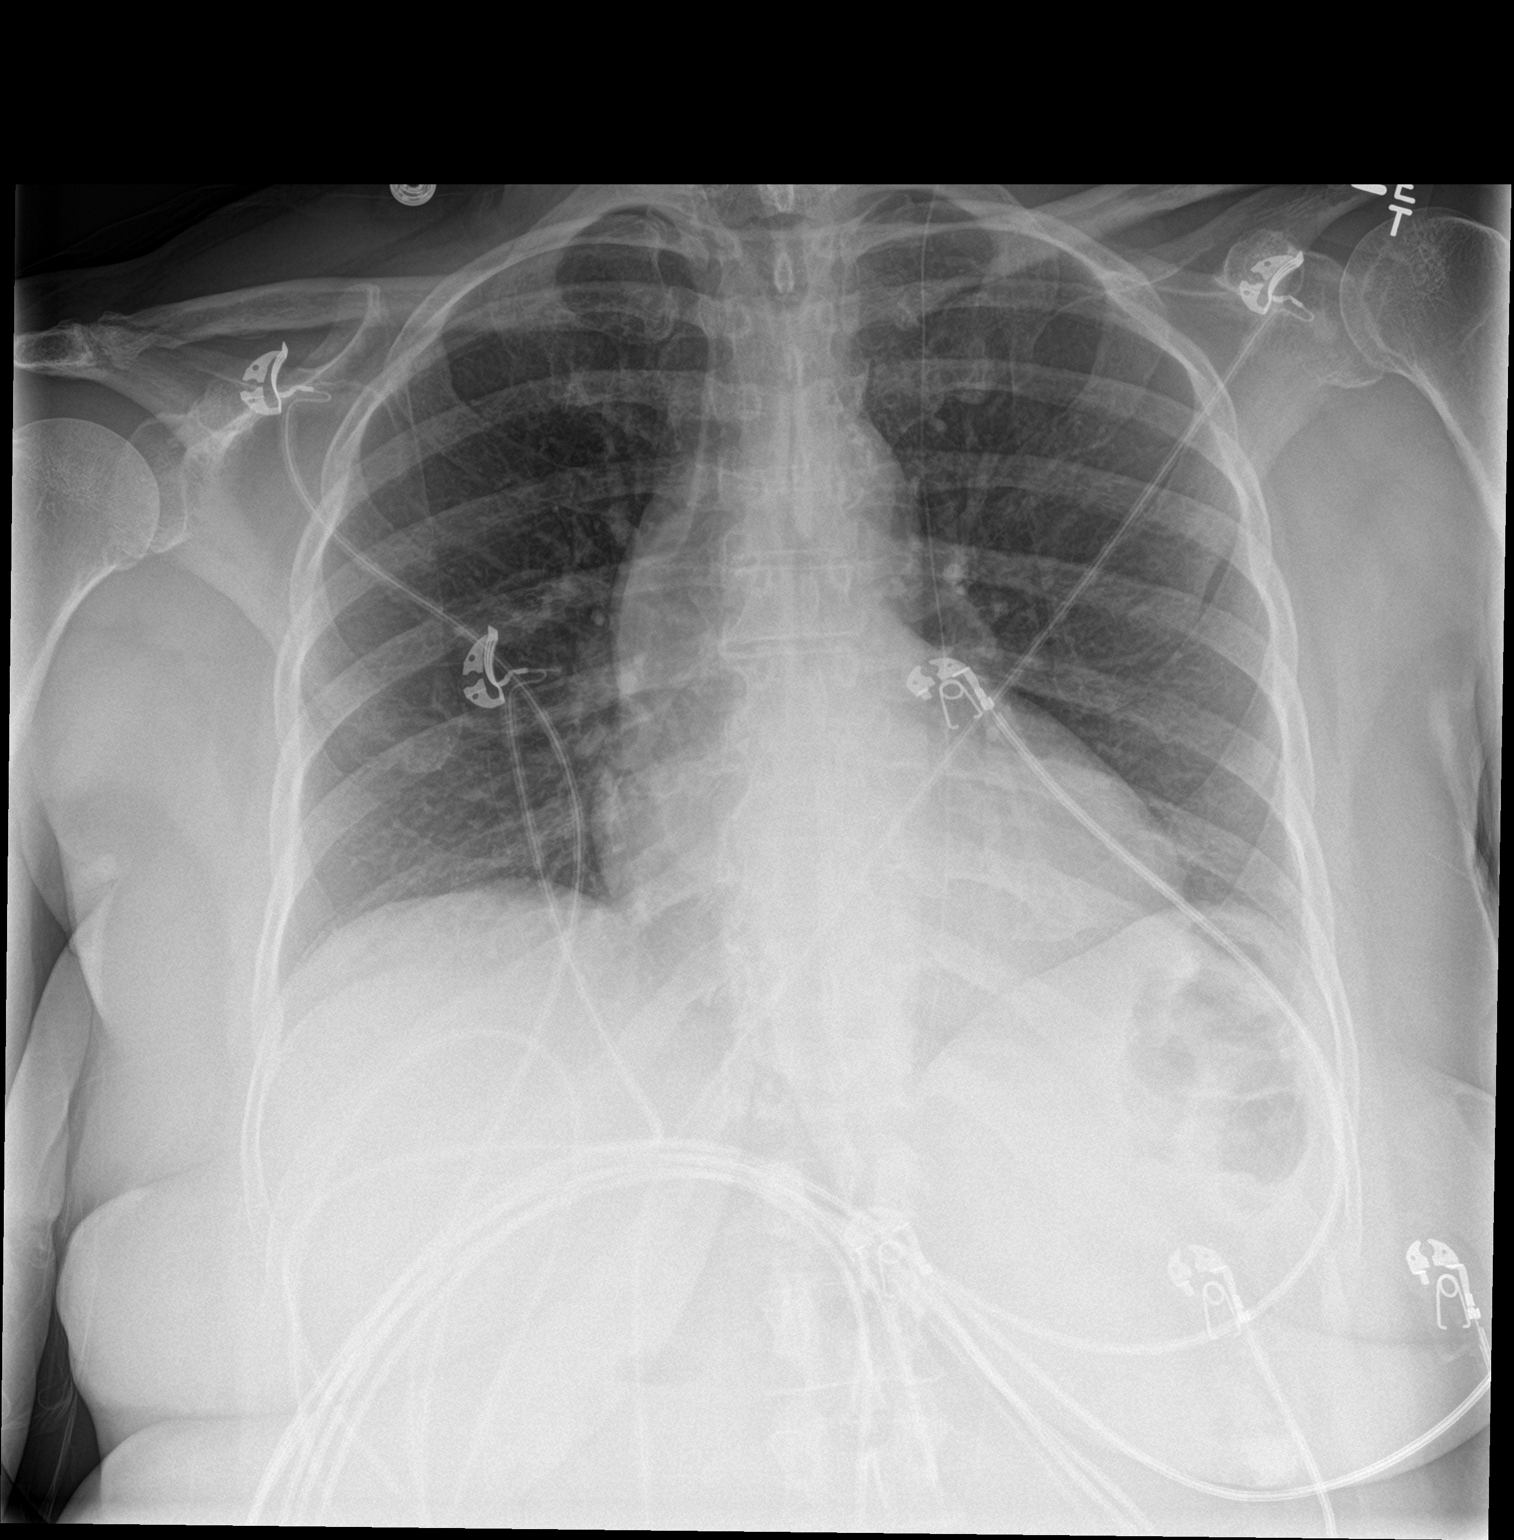

[chest lat]
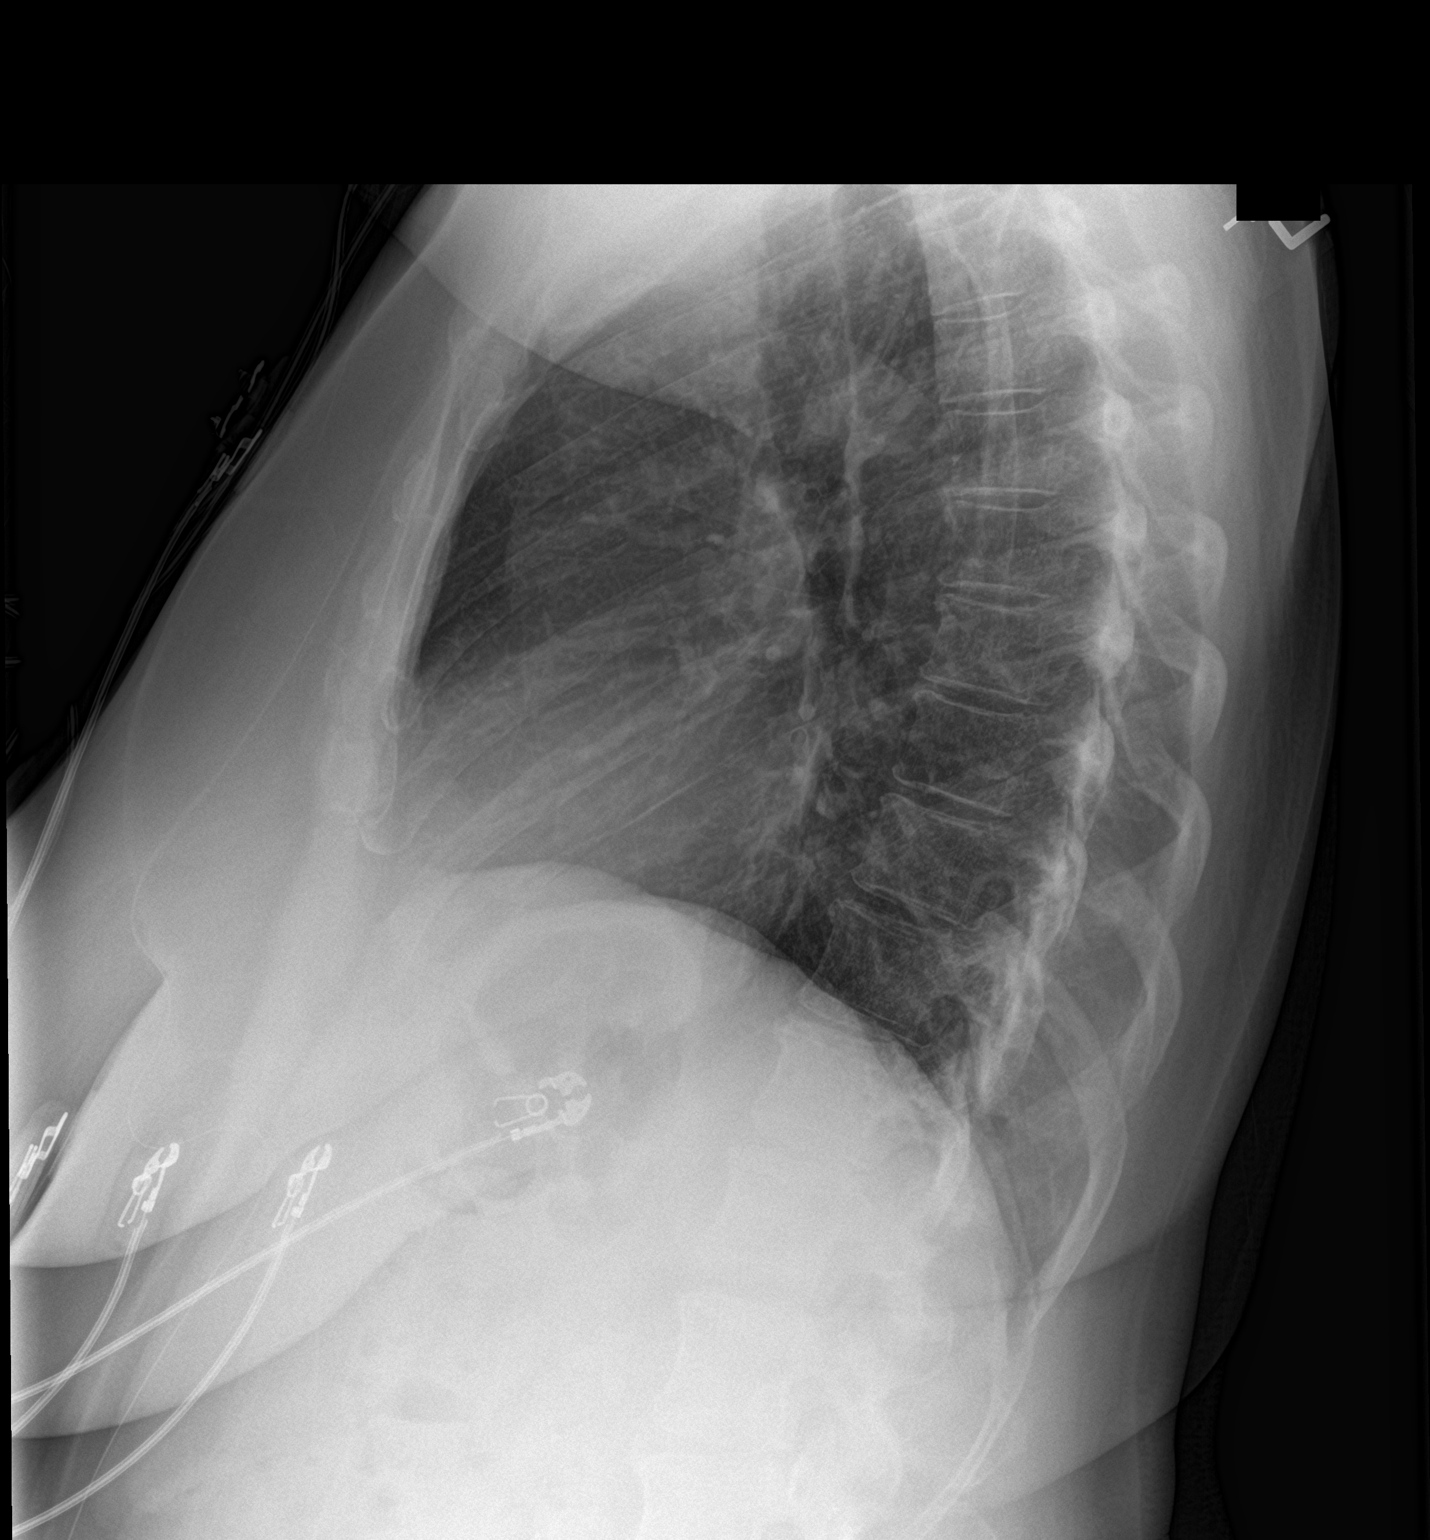

[2 of 2 positions shown; findings below may reference images not displayed]

FINDINGS: The lungs are well-aerated and clear. There is no evidence of focal
opacification, pleural effusion or pneumothorax.

The heart is borderline normal in size. No acute osseous
abnormalities are seen.
IMPRESSION: No acute cardiopulmonary process seen.

## 2016-04-28 ENCOUNTER — Ambulatory Visit (INDEPENDENT_AMBULATORY_CARE_PROVIDER_SITE_OTHER): Payer: Medicare HMO | Admitting: Family Medicine

## 2016-04-28 ENCOUNTER — Encounter: Payer: Self-pay | Admitting: Family Medicine

## 2016-04-28 ENCOUNTER — Ambulatory Visit (INDEPENDENT_AMBULATORY_CARE_PROVIDER_SITE_OTHER): Payer: Medicare HMO

## 2016-04-28 VITALS — BP 152/74 | HR 86 | Temp 98.6°F | Ht 61.0 in | Wt 166.1 lb

## 2016-04-28 VITALS — BP 152/74 | HR 86 | Resp 18 | Wt 166.0 lb

## 2016-04-28 DIAGNOSIS — Z0000 Encounter for general adult medical examination without abnormal findings: Secondary | ICD-10-CM | POA: Diagnosis not present

## 2016-04-28 DIAGNOSIS — E1122 Type 2 diabetes mellitus with diabetic chronic kidney disease: Secondary | ICD-10-CM

## 2016-04-28 DIAGNOSIS — I10 Essential (primary) hypertension: Secondary | ICD-10-CM

## 2016-04-28 DIAGNOSIS — D509 Iron deficiency anemia, unspecified: Secondary | ICD-10-CM

## 2016-04-28 DIAGNOSIS — N183 Chronic kidney disease, stage 3 unspecified: Secondary | ICD-10-CM

## 2016-04-28 DIAGNOSIS — M5440 Lumbago with sciatica, unspecified side: Secondary | ICD-10-CM

## 2016-04-28 DIAGNOSIS — E782 Mixed hyperlipidemia: Secondary | ICD-10-CM | POA: Diagnosis not present

## 2016-04-28 MED ORDER — PREDNISONE 5 MG PO TABS: ORAL_TABLET | ORAL | 0 refills | Status: DC

## 2016-04-28 NOTE — Patient Instructions (Addendum)
Health maintenance: Due for TDAP vaccine, patient will check with her insurance company to see if this is covered.   Abnormal screenings: None   Patient concerns: Back pain that keeps her up at night, would like to discuss different treatment options with Dr. Moshe Cipro. Recommend follow up appointment with Dr. Moshe Cipro   Nurse concerns: None   Next PCP appt: In 4 months with Dr. Moshe Cipro. Lab work was ordered today, please have these completed before your appointment with Dr. Moshe Cipro  Advance directive discussed with patient today. Copy provided for patient to complete at home and have notarized. Patient agrees to have copy sent to our office once it is complete.   Fall Prevention in the Home Introduction Falls can cause injuries. They can happen to people of all ages. There are many things you can do to make your home safe and to help prevent falls. What can I do on the outside of my home?  Regularly fix the edges of walkways and driveways and fix any cracks.  Remove anything that might make you trip as you walk through a door, such as a raised step or threshold.  Trim any bushes or trees on the path to your home.  Use bright outdoor lighting.  Clear any walking paths of anything that might make someone trip, such as rocks or tools.  Regularly check to see if handrails are loose or broken. Make sure that both sides of any steps have handrails.  Any raised decks and porches should have guardrails on the edges.  Have any leaves, snow, or ice cleared regularly.  Use sand or salt on walking paths during winter.  Clean up any spills in your garage right away. This includes oil or grease spills. What can I do in the bathroom?  Use night lights.  Install grab bars by the toilet and in the tub and shower. Do not use towel bars as grab bars.  Use non-skid mats or decals in the tub or shower.  If you need to sit down in the shower, use a plastic, non-slip stool.  Keep the floor  dry. Clean up any water that spills on the floor as soon as it happens.  Remove soap buildup in the tub or shower regularly.  Attach bath mats securely with double-sided non-slip rug tape.  Do not have throw rugs and other things on the floor that can make you trip. What can I do in the bedroom?  Use night lights.  Make sure that you have a light by your bed that is easy to reach.  Do not use any sheets or blankets that are too big for your bed. They should not hang down onto the floor.  Have a firm chair that has side arms. You can use this for support while you get dressed.  Do not have throw rugs and other things on the floor that can make you trip. What can I do in the kitchen?  Clean up any spills right away.  Avoid walking on wet floors.  Keep items that you use a lot in easy-to-reach places.  If you need to reach something above you, use a strong step stool that has a grab bar.  Keep electrical cords out of the way.  Do not use floor polish or wax that makes floors slippery. If you must use wax, use non-skid floor wax.  Do not have throw rugs and other things on the floor that can make you trip. What can I do with  my stairs?  Do not leave any items on the stairs.  Make sure that there are handrails on both sides of the stairs and use them. Fix handrails that are broken or loose. Make sure that handrails are as long as the stairways.  Check any carpeting to make sure that it is firmly attached to the stairs. Fix any carpet that is loose or worn.  Avoid having throw rugs at the top or bottom of the stairs. If you do have throw rugs, attach them to the floor with carpet tape.  Make sure that you have a light switch at the top of the stairs and the bottom of the stairs. If you do not have them, ask someone to add them for you. What else can I do to help prevent falls?  Wear shoes that:  Do not have high heels.  Have rubber bottoms.  Are comfortable and fit you  well.  Are closed at the toe. Do not wear sandals.  If you use a stepladder:  Make sure that it is fully opened. Do not climb a closed stepladder.  Make sure that both sides of the stepladder are locked into place.  Ask someone to hold it for you, if possible.  Clearly mark and make sure that you can see:  Any grab bars or handrails.  First and last steps.  Where the edge of each step is.  Use tools that help you move around (mobility aids) if they are needed. These include:  Canes.  Walkers.  Scooters.  Crutches.  Turn on the lights when you go into a dark area. Replace any light bulbs as soon as they burn out.  Set up your furniture so you have a clear path. Avoid moving your furniture around.  If any of your floors are uneven, fix them.  If there are any pets around you, be aware of where they are.  Review your medicines with your doctor. Some medicines can make you feel dizzy. This can increase your chance of falling. Ask your doctor what other things that you can do to help prevent falls. This information is not intended to replace advice given to you by your health care provider. Make sure you discuss any questions you have with your health care provider. Document Released: 01/16/2009 Document Revised: 08/28/2015 Document Reviewed: 04/26/2014  2017 [employer]vier

## 2016-04-28 NOTE — Assessment & Plan Note (Signed)
1 week h/o increased pain with left SI joint tenderness, short course of oral prednisone

## 2016-04-28 NOTE — Assessment & Plan Note (Signed)
Sub optimal though adequate control, no med change DASH diet and commitment to daily physical activity for a minimum of 30 minutes discussed and encouraged, as a part of hypertension management. The importance of attaining a healthy weight is also discussed.  BP/Weight 04/28/2016 04/28/2016 04/14/2016 04/01/2016 03/24/2016 03/09/2016 [id number]  Systolic BP 0000000 0000000 [id number] 0000000 [id number]  Diastolic BP 74 74 81 78 80 72 73  Wt. (Lbs) 166 166.12 - 170 172 168.08 -  BMI 31.37 31.39 - 32.12 32.5 31.76 -

## 2016-04-28 NOTE — Progress Notes (Signed)
Subjective:   Christine Dominguez is a 66 y.o. female who presents for Medicare Annual (Subsequent) preventive examination.  Review of Systems:  Cardiac Risk Factors include: advanced age (>25mn, >>67women);diabetes mellitus;dyslipidemia;hypertension;obesity (BMI >30kg/m2)     Objective:     Vitals: Pulse 86   Temp 98.6 F (37 C) (Oral)   Ht '5\' 1"'$  (1.549 m)   Wt 166 lb 1.9 oz (75.4 kg)   SpO2 98%   BMI 31.39 kg/m   Body mass index is 31.39 kg/m.   Tobacco History  Smoking Status  . Former Smoker  . Packs/day: 0.50  . Years: 15.00  . Types: Cigarettes  . Quit date: 07/07/2001  Smokeless Tobacco  . Never Used     Counseling given: Not Answered   Past Medical History:  Diagnosis Date  . Anemia   . Arthritis    Cervical spine  . [place], recurred in 2012   Paraganglioma of jugular tympamicum, recurrent in 2012  . Chronic back pain 2007   Disabled   . Essential hypertension, benign 1980  . GERD (gastroesophageal reflux disease) 2000  . Hydrocephalus    Treated with VP shunt and then intracranial tumor resection (?Cholesteatoma); no longer followed actively by neurosurgery or otolaryngology  . Hyperlipidemia 2013  . Hypothyroidism 2013  . [place]   . Obesity   . Type 2 diabetes mellitus ([place] 1991   Past Surgical History:  Procedure Laterality Date  . CATARACT EXTRACTION W/PHACO Right 07/17/2013   Procedure: CATARACT EXTRACTION PHACO AND INTRAOCULAR LENS PLACEMENT (IOC);  Surgeon: MElta GuadeloupeT. SGershon Crane MD;  Location: [facility];  Service: Ophthalmology;  Laterality: Right;  CDE:7.24  . COLONOSCOPY  06/2010  . Hemorrhoidectomy    . [facility]   [facility]  . Teeth pulled    . TRIGGER FINGER RELEASE     Rght thumb  . TUBAL LIGATION  1980  . TUBAL LIGATION    . VENTRICULOPERITONEAL SHUNT  1998   [facility]  Family History  Problem Relation Age of Onset  . Lung cancer Mother   . Hypertension  Mother 722 . Hypertension Brother   . Diabetes Brother   . Diabetes Sister   . Kidney failure Brother   . Hypertension Maternal Grandmother   . Heart disease Maternal Grandmother   . Other Son     stillborn  . Other Son     died at birth  . Diabetes Maternal Grandfather   . Other Son     was murdered   History  Sexual Activity  . Sexual activity: No    Comment: tubal    Outpatient Encounter Prescriptions as of 04/28/2016  Medication Sig  . ACCU-CHEK FASTCLIX LANCETS MISC TEST BLOOD SUGAR FOUR TIMES DAILY  . ACCU-CHEK SMARTVIEW test strip TEST 4 TIMES DAILY  . Alcohol Swabs (B-D SINGLE USE SWABS REGULAR) PADS TEST FOUR TIMES A DAY  . amLODipine (NORVASC) 5 MG tablet Take 1 tablet (5 mg total) by mouth daily.  .Marland Kitchenaspirin EC 81 MG tablet Take 81 mg by mouth at bedtime.  . B-D ULTRAFINE III SHORT PEN 31G X 8 MM MISC USE  TO INJECT EVERY DAY  WITH  LANTUS  . Blood Glucose Calibration (ACCU-CHEK SMARTVIEW CONTROL) LIQD USE AS DIRECTED  . Blood Glucose Monitoring Suppl (ACCU-CHEK NANO SMARTVIEW) w/Device KIT Test 4 x daily. E11.65  . cloNIDine (CATAPRES) 0.1 MG tablet TAKE 1 TABLET TWICE DAILY  .  docusate sodium (STOOL SOFTENER) 100 MG capsule Take 100 mg by mouth as needed for mild constipation.  . ferrous sulfate 325 (65 FE) MG EC tablet Take 325 mg by mouth daily.  Marland Kitchen gabapentin (NEURONTIN) 100 MG capsule TAKE 1 CAPSULE TWICE DAILY  . glucose (SUNMARK GLUCOSE) 4 GM chewable tablet Chew by mouth as needed.   . hydrochlorothiazide (HYDRODIURIL) 12.5 MG tablet TAKE 1 TABLET EVERY DAY  . Insulin Glargine (LANTUS SOLOSTAR) 100 UNIT/ML Solostar Pen Inject 44 Units into the skin at bedtime. (Patient taking differently: Inject 35 Units into the skin at bedtime. )  . levothyroxine (SYNTHROID, LEVOTHROID) 88 MCG tablet TAKE 1 TABLET EVERY DAY BEFORE BREAKFAST  . nystatin (MYCOSTATIN/NYSTOP) powder APPLY TOPICALLY TO RASH ON LOWER ABDOMEN AT BEDTIME AS NEEDED  . omeprazole (PRILOSEC) 40 MG  capsule Take 1 capsule (40 mg total) by mouth daily.  . Simethicone (GAS RELIEF DROPS PO) Take by mouth as needed.  . simvastatin (ZOCOR) 10 MG tablet Take 1 tablet (10 mg total) by mouth at bedtime.  . sodium chloride (OCEAN) 0.65 % nasal spray 1 spray as needed.   Marland Kitchen tiZANidine (ZANAFLEX) 2 MG tablet Take 1 tablet (2 mg total) by mouth daily as needed for muscle spasms.  . TRADJENTA 5 MG TABS tablet TAKE 1 TABLET EVERY DAY  . valsartan (DIOVAN) 320 MG tablet TAKE 1 TABLET EVERY DAY (DOSE INCREASE)  . Vitamin D, Ergocalciferol, (DRISDOL) 50000 units CAPS capsule TAKE 1 CAPSULE EVERY 7 DAYS  . metoCLOPramide (REGLAN) 10 MG tablet Take 0.5 tablets (5 mg total) by mouth every 6 (six) hours as needed for nausea (nausea/headache). (Patient not taking: Reported on 04/28/2016)  . [DISCONTINUED] benzonatate (TESSALON) 100 MG capsule Take 1 capsule (100 mg total) by mouth 2 (two) times daily as needed for cough.  . [DISCONTINUED] Iron 66 MG TABS Take 1 tablet by mouth daily.    No facility-administered encounter medications on file as of 04/28/2016.     Activities of Daily Living In your present state of health, do you have any difficulty performing the following activities: 04/28/2016 03/09/2016  Hearing? Y N  Vision? N N  Difficulty concentrating or making decisions? N N  Walking or climbing stairs? Y N  Dressing or bathing? N N  Doing errands, shopping? N N  Preparing Food and eating ? N -  Using the Toilet? N -  In the past six months, have you accidently leaked urine? N -  Do you have problems with loss of bowel control? N -  Managing your Medications? N -  Managing your Finances? N -  Housekeeping or managing your Housekeeping? N -  Some recent data might be hidden    Patient Care Team: [name], MD as PCP - General Rutherford Guys, MD as Consulting Physician (Ophthalmology) Satira Sark, MD as Consulting Physician (Cardiology) Scherry Ran, MD (Otolaryngology) Jonnie Kind, MD as Consulting Physician (Obstetrics and Gynecology) Cassandria Anger, MD as Consulting Physician (Endocrinology) Teena Irani, PTA as Physical Therapy Assistant (Physical Therapy) Gean Birchwood, DPM as Consulting Physician (Podiatry) Carlean Purl, MD as Referring Physician (Otolaryngology)    Assessment:    Exercise Activities and Dietary recommendations Current Exercise Habits: Home exercise routine, Type of exercise: walking;stretching, Time (Minutes): 15, Frequency (Times/Week): 4, Weekly Exercise (Minutes/Week): 60, Intensity: Mild, Exercise limited by: None identified  Goals    . Exercise 3x per week (30 min per time)  Patient would like to increase her exercise to 30 minutes a day for at least 4 days a week.      Fall Risk Fall Risk  04/28/2016 09/11/2015 08/12/2015 06/06/2015 08/08/2014  Falls in the past year? No No No No No  Risk for fall due to : - - - - -   Depression Screen PHQ 2/9 Scores 04/28/2016 01/05/2016 09/11/2015 06/06/2015  PHQ - 2 Score 0 0 0 0     Cognitive Function: Normal MMSE - Mini Mental State Exam 01/13/2016  Orientation to time 5  Orientation to Place 5  Registration 3  Attention/ Calculation 5  Recall 3  Language- name 2 objects 2  Language- repeat 1  Language- follow 3 step command 3  Language- read & follow direction 1  Write a sentence 1  Copy design 1  Total score 30     6CIT Screen 04/28/2016  What Year? 0 points  What month? 0 points  What time? 0 points  Count back from 20 0 points  Months in reverse 0 points  Repeat phrase 0 points  Total Score 0    Immunization History  Administered Date(s) Administered  . Influenza Split 02/15/2012  . Influenza Whole 01/06/2010  . Influenza,inj,Quad PF,36+ Mos 05/14/2013, 01/17/2014, 04/03/2015, 12/17/2015  . Pneumococcal Conjugate-13 08/08/2014  . Pneumococcal Polysaccharide-23 10/13/2009, 08/12/2015  . Tdap 08/24/2005  . Zoster 04/28/2013   Screening  Tests Health Maintenance  Topic Date Due  . TETANUS/TDAP  01/21/2017 (Originally 08/25/2015)  . FOOT EXAM  07/02/2016  . HEMOGLOBIN A1C  09/15/2016  . OPHTHALMOLOGY EXAM  11/24/2016  . MAMMOGRAM  09/17/2017  . PAP SMEAR  01/05/2019  . COLONOSCOPY  06/30/2020  . INFLUENZA VACCINE  Completed  . Hepatitis C Screening  Completed  . HIV Screening  Completed      Plan:  I have personally reviewed and addressed the Medicare Annual Wellness questionnaire and have noted the following in the patient's chart:  A. Medical and social history B. Use of alcohol, tobacco or illicit drugs  C. Current medications and supplements D. Functional ability and status E.  Nutritional status F.  Physical activity G. Advance directives H. List of other physicians I.  Hospitalizations, surgeries, and ER visits in previous 12 months J.  [place] to include hearing, vision, cognitive, depression L. Referrals and appointments - none  In addition, I have reviewed and discussed with patient certain preventive protocols, quality metrics, and best practice recommendations. A written personalized care plan for preventive services as well as general preventive health recommendations were provided to patient.  Signed,   Stormy Fabian, LPN Lead Nurse Health Advisor

## 2016-04-28 NOTE — Progress Notes (Signed)
   Christine Dominguez     MRN: [id number]      DOB: 11/01/1950   HPI Christine Dominguez is here with a 1 week h/o left sI joint pin radiaiting to buttock, no aggravating factors. Has benefited in the past from epidural injection but does not want high dose steroid. States muscle relaxants cause her to be sleepy and she has CKD Blood sugar is gradually improving , which is good Denies lower extremty eeakness or numbness, denies incontinence of stool or urine  ROS See HPI Denies recent fever or chills. .   Denies dysuria, frequency, hesitancy or incontinence. Denies joint pain, swelling and limitation in mobility. Denies headaches, seizures, numbness, or tingling. Denies depression, anxiety or insomnia. Denies skin break down or rash.   PE  BP (!) 152/74   Pulse 86   Resp 18   Wt 166 lb (75.3 kg)   SpO2 98%   BMI 31.37 kg/m   Patient alert and oriented and in no cardiopulmonary distress.  HEENT: No facial asymmetry, EOMI,   oropharynx pink and moist.  Neck supple no JVD, no mass.  .   Ext: No edema  MS: Adequate though reduced  ROM spine, normal in  shoulders, hips and knees. Localized left SI joint tenderness  Skin: Intact, no ulcerations or rash noted.  Psych: Good eye contact, normal affect. Memory intact not anxious or depressed appearing.  CNS: CN 2-12 intact, power,  normal throughout.no focal deficits noted.   Assessment & Plan  Back pain 1 week h/o increased pain with left SI joint tenderness, short course of oral prednisone  Essential hypertension, benign Sub optimal though adequate control, no med change DASH diet and commitment to daily physical activity for a minimum of 30 minutes discussed and encouraged, as a part of hypertension management. The importance of attaining a healthy weight is also discussed.  BP/Weight 04/28/2016 04/28/2016 04/14/2016 04/01/2016 03/24/2016 03/09/2016 [id number]  Systolic BP 0000000 0000000 [id number]  Diastolic  BP 74 74 81 78 80 72 73  Wt. (Lbs) 166 166.12 - 170 172 168.08 -  BMI 31.37 31.39 - 32.12 32.5 31.76 -

## 2016-04-28 NOTE — Patient Instructions (Signed)
F/u as before  Prednisone sent in for sciatica   No med changes

## 2016-05-03 ENCOUNTER — Other Ambulatory Visit: Payer: Self-pay | Admitting: "Endocrinology

## 2016-05-17 ENCOUNTER — Other Ambulatory Visit: Payer: Self-pay | Admitting: Family Medicine

## 2016-05-20 ENCOUNTER — Other Ambulatory Visit: Payer: Self-pay | Admitting: "Endocrinology

## 2016-06-01 ENCOUNTER — Other Ambulatory Visit: Payer: Self-pay | Admitting: Family Medicine

## 2016-06-03 ENCOUNTER — Telehealth: Payer: Self-pay | Admitting: Family Medicine

## 2016-06-03 NOTE — Telephone Encounter (Signed)
Christine Dominguez is c/o bilateral leg swelling, she is wondering if it could be possible medication induced, please advise?

## 2016-06-03 NOTE — Telephone Encounter (Signed)
Patient aware.

## 2016-06-03 NOTE — Telephone Encounter (Signed)
Both amlodipine and gabapentin may cause leg swelling, however been on them for a long time. Keep sodium intake down and elevate her legs

## 2016-06-07 ENCOUNTER — Telehealth: Payer: Self-pay | Admitting: Family Medicine

## 2016-06-07 NOTE — Telephone Encounter (Signed)
Christine Dominguez is c/o feet and legs still swelling please advise?

## 2016-06-07 NOTE — Telephone Encounter (Signed)
appt made for Wednesday.

## 2016-06-09 ENCOUNTER — Encounter: Payer: Self-pay | Admitting: Family Medicine

## 2016-06-09 ENCOUNTER — Ambulatory Visit (INDEPENDENT_AMBULATORY_CARE_PROVIDER_SITE_OTHER): Payer: Medicare HMO | Admitting: Family Medicine

## 2016-06-09 VITALS — BP 158/74 | HR 84 | Resp 15 | Ht 61.0 in | Wt 169.0 lb

## 2016-06-09 DIAGNOSIS — E784 Other hyperlipidemia: Secondary | ICD-10-CM

## 2016-06-09 DIAGNOSIS — N183 Chronic kidney disease, stage 3 (moderate): Secondary | ICD-10-CM

## 2016-06-09 DIAGNOSIS — M5440 Lumbago with sciatica, unspecified side: Secondary | ICD-10-CM

## 2016-06-09 DIAGNOSIS — E7849 Other hyperlipidemia: Secondary | ICD-10-CM

## 2016-06-09 DIAGNOSIS — I10 Essential (primary) hypertension: Secondary | ICD-10-CM | POA: Diagnosis not present

## 2016-06-09 DIAGNOSIS — E1122 Type 2 diabetes mellitus with diabetic chronic kidney disease: Secondary | ICD-10-CM

## 2016-06-09 MED ORDER — HYDROCHLOROTHIAZIDE 25 MG PO TABS: 25.0000 mg | ORAL_TABLET | Freq: Every day | ORAL | 3 refills | Status: DC

## 2016-06-09 MED ORDER — POTASSIUM CHLORIDE ER 10 MEQ PO TBCR: 10.0000 meq | EXTENDED_RELEASE_TABLET | Freq: Two times a day (BID) | ORAL | 0 refills | Status: DC

## 2016-06-09 MED ORDER — POTASSIUM CHLORIDE CRYS ER 10 MEQ PO TBCR: 10.0000 meq | EXTENDED_RELEASE_TABLET | Freq: Two times a day (BID) | ORAL | 5 refills | Status: DC

## 2016-06-09 MED ORDER — POTASSIUM CHLORIDE ER 10 MEQ PO TBCR: 10.0000 meq | EXTENDED_RELEASE_TABLET | Freq: Two times a day (BID) | ORAL | 3 refills | Status: DC

## 2016-06-09 NOTE — Progress Notes (Signed)
Christine Dominguez     MRN: [id number]      DOB: 08/15/1950   HPI Christine Dominguez is here with a  3 week h/o leg swelling and increased shortness of breath with activity, states this started since she he has been back on amlodipine States she  has no pND or orthopnea , no chest pain, has used tigths on one occasion and swelling was less. Also notes thart not much swelling in the morning Requests form completion for handicap plate, she does have lumbar disc disease which is at times disabling. Blood sugar fluctuates with food intake , she is working on this with endo ROS Denies recent fever or chills. Denies sinus pressure, nasal congestion, ear pain or sore throat. Denies chest congestion, productive cough or wheezing.  Denies abdominal pain, nausea, vomiting,diarrhea or constipation.   Denies dysuria, frequency, hesitancy or incontinence.  Denies headaches, seizures, numbness, or tingling. Denies depression, anxiety or insomnia. Denies skin break down or rash.   PE  BP (!) 158/74   Pulse 84   Resp 15   Ht 5\' 1"  (1.549 m)   Wt 169 lb (76.7 kg)   SpO2 98%   BMI 31.93 kg/m   Patient alert and oriented and in no cardiopulmonary distress.  HEENT: No facial asymmetry, EOMI,   oropharynx pink and moist.  Neck supple no JVD, no mass.  Chest: Clear to auscultation bilaterally.  CVS: S1, S2 no murmurs, no S3.Regular rate.  ABD: Soft non tender.   Ext: trace edema  MS: Decreased  ROM lumbar  Spine, normal in  shoulders, hips and knees.  Skin: Intact, no ulcerations or rash noted.  Psych: Good eye contact, normal affect. Memory intact not anxious or depressed appearing.  CNS: CN 2-12 intact, power,  normal throughout.no focal deficits noted.   Assessment & Plan  Essential hypertension, benign Uncontrolled, increase HCTZ to 25  Mg daily and add potassium 10 meq twice daily DASH diet and commitment to daily physical activity for a minimum of 30 minutes discussed  and encouraged, as a part of hypertension management. The importance of attaining a healthy weight is also discussed.  BP/Weight 06/09/2016 04/28/2016 04/28/2016 04/14/2016 04/01/2016 03/24/2016 84/08/3644  Systolic BP 803 212 248 250 037 048 889  Diastolic BP 74 74 74 81 78 80 72  Wt. (Lbs) 169 166 166.12 - 170 172 168.08  BMI 31.93 31.37 31.39 - 32.12 32.5 31.76       Back pain Established arthritic disease in lumbar spine and disc disease which has at times been sevrely disabling. Form completed for handicap parking sticker  Diabetes mellitus with stage 3 chronic kidney disease ([place]) Uncontrolled, followed by endo Christine Dominguez is reminded of the importance of commitment to daily physical activity for 30 minutes or more, as able and the need to limit carbohydrate intake to 30 to 60 grams per meal to help with blood sugar control.   The need to take medication as prescribed, test blood sugar as directed, and to call between visits if there is a concern that blood sugar is uncontrolled is also discussed.   Christine Dominguez is reminded of the importance of daily foot exam, annual eye examination, and good blood sugar, blood pressure and cholesterol control.  Diabetic Labs Latest Ref Rng & Units 04/01/2016 03/17/2016 01/06/2016 12/15/2015 12/10/2015  HbA1c <5.7 % - 8.2(H) - - 8.8(H)  Microalbumin Not estab mg/dL - 0.5 - - -  Micro/Creat Ratio <30 mcg/mg creat - 6 - - -  Chol 125 - 200 mg/dL - - 109(L) - -  HDL >=46 mg/dL - - 36(L) - -  Calc LDL <130 mg/dL - - 51 - -  Triglycerides <150 mg/dL - - 111 - -  Creatinine 0.44 - 1.00 mg/dL 1.32(H) 1.52(H) 1.42(H) 1.25(H) 1.38(H)   BP/Weight 06/09/2016 04/28/2016 04/28/2016 04/14/2016 04/01/2016 03/24/2016 61/12/120  Systolic BP 241 146 431 427 670 110 034  Diastolic BP 74 74 74 81 78 80 72  Wt. (Lbs) 169 166 166.12 - 170 172 168.08  BMI 31.93 31.37 31.39 - 32.12 32.5 31.76   Foot/eye exam completion dates Latest Ref Rng & Units 11/25/2015  11/19/2014  Eye Exam No Retinopathy No Retinopathy No Retinopathy  Foot exam Order - - -  Foot Form Completion - - -        Hyperlipidemia Hyperlipidemia:Low fat diet discussed and encouraged.   Lipid Panel  Lab Results  Component Value Date   CHOL 109 (L) 01/06/2016   HDL 36 (L) 01/06/2016   LDLCALC 51 01/06/2016   TRIG 111 01/06/2016   CHOLHDL 3.0 01/06/2016   Needs to increase exercise and HDL

## 2016-06-09 NOTE — Assessment & Plan Note (Addendum)
Uncontrolled, increase HCTZ to 25  Mg daily and add potassium 10 meq twice daily DASH diet and commitment to daily physical activity for a minimum of 30 minutes discussed and encouraged, as a part of hypertension management. The importance of attaining a healthy weight is also discussed.  BP/Weight 06/09/2016 04/28/2016 04/28/2016 04/14/2016 04/01/2016 03/24/2016 43/04/5398  Systolic BP 867 619 509 326 712 458 099  Diastolic BP 74 74 74 81 78 80 72  Wt. (Lbs) 169 166 166.12 - 170 172 168.08  BMI 31.93 31.37 31.39 - 32.12 32.5 31.76

## 2016-06-09 NOTE — Patient Instructions (Addendum)
F/u as before, call if you need me sooner  Increase HCTZ to 25 mg one daily, oK to take two 12.5 mg tabs daily till you get new talet  Satr potassium one twice daily  Please get chem 7 and EGFR on same day you are getting blood for Dr Dorris Fetch, we will give you an order  Keep salt in take down, avoid canned foods Elevate legs when sitting and wear compression hose when needed

## 2016-06-10 ENCOUNTER — Other Ambulatory Visit: Payer: Self-pay | Admitting: "Endocrinology

## 2016-06-10 NOTE — Assessment & Plan Note (Signed)
Hyperlipidemia:Low fat diet discussed and encouraged.   Lipid Panel  Lab Results  Component Value Date   CHOL 109 (L) 01/06/2016   HDL 36 (L) 01/06/2016   LDLCALC 51 01/06/2016   TRIG 111 01/06/2016   CHOLHDL 3.0 01/06/2016   Needs to increase exercise and HDL

## 2016-06-10 NOTE — Assessment & Plan Note (Signed)
Established arthritic disease in lumbar spine and disc disease which has at times been sevrely disabling. Form completed for handicap parking sticker

## 2016-06-10 NOTE — Assessment & Plan Note (Signed)
Uncontrolled, followed by endo Christine Dominguez is reminded of the importance of commitment to daily physical activity for 30 minutes or more, as able and the need to limit carbohydrate intake to 30 to 60 grams per meal to help with blood sugar control.   The need to take medication as prescribed, test blood sugar as directed, and to call between visits if there is a concern that blood sugar is uncontrolled is also discussed.   Christine Dominguez is reminded of the importance of daily foot exam, annual eye examination, and good blood sugar, blood pressure and cholesterol control.  Diabetic Labs Latest Ref Rng & Units 04/01/2016 03/17/2016 01/06/2016 12/15/2015 12/10/2015  HbA1c <5.7 % - 8.2(H) - - 8.8(H)  Microalbumin Not estab mg/dL - 0.5 - - -  Micro/Creat Ratio <30 mcg/mg creat - 6 - - -  Chol 125 - 200 mg/dL - - 109(L) - -  HDL >=46 mg/dL - - 36(L) - -  Calc LDL <130 mg/dL - - 51 - -  Triglycerides <150 mg/dL - - 111 - -  Creatinine 0.44 - 1.00 mg/dL 1.32(H) 1.52(H) 1.42(H) 1.25(H) 1.38(H)   BP/Weight 06/09/2016 04/28/2016 04/28/2016 04/14/2016 04/01/2016 03/24/2016 77/11/2421  Systolic BP 536 144 315 400 867 619 509  Diastolic BP 74 74 74 81 78 80 72  Wt. (Lbs) 169 166 166.12 - 170 172 168.08  BMI 31.93 31.37 31.39 - 32.12 32.5 31.76   Foot/eye exam completion dates Latest Ref Rng & Units 11/25/2015 11/19/2014  Eye Exam No Retinopathy No Retinopathy No Retinopathy  Foot exam Order - - -  Foot Form Completion - - -

## 2016-06-16 ENCOUNTER — Other Ambulatory Visit: Payer: Self-pay | Admitting: "Endocrinology

## 2016-06-16 DIAGNOSIS — E039 Hypothyroidism, unspecified: Secondary | ICD-10-CM | POA: Diagnosis not present

## 2016-06-16 DIAGNOSIS — E1122 Type 2 diabetes mellitus with diabetic chronic kidney disease: Secondary | ICD-10-CM | POA: Diagnosis not present

## 2016-06-16 DIAGNOSIS — N183 Chronic kidney disease, stage 3 (moderate): Secondary | ICD-10-CM | POA: Diagnosis not present

## 2016-06-16 LAB — COMPREHENSIVE METABOLIC PANEL
ALK PHOS: 68 U/L (ref 33–130)
ALT: 10 U/L (ref 6–29)
AST: 11 U/L (ref 10–35)
Albumin: 3.9 g/dL (ref 3.6–5.1)
BUN: 19 mg/dL (ref 7–25)
CO2: 28 mmol/L (ref 20–31)
CREATININE: 1.41 mg/dL — AB (ref 0.50–0.99)
Calcium: 9.7 mg/dL (ref 8.6–10.4)
Chloride: 102 mmol/L (ref 98–110)
Glucose, Bld: 152 mg/dL — ABNORMAL HIGH (ref 65–99)
POTASSIUM: 4.5 mmol/L (ref 3.5–5.3)
Sodium: 138 mmol/L (ref 135–146)
TOTAL PROTEIN: 7.7 g/dL (ref 6.1–8.1)
Total Bilirubin: 0.3 mg/dL (ref 0.2–1.2)

## 2016-06-16 LAB — T4, FREE: Free T4: 1.2 ng/dL (ref 0.8–1.8)

## 2016-06-16 LAB — TSH: TSH: 0.14 m[IU]/L — AB

## 2016-06-17 ENCOUNTER — Other Ambulatory Visit: Payer: Self-pay

## 2016-06-17 ENCOUNTER — Telehealth: Payer: Self-pay | Admitting: Family Medicine

## 2016-06-17 LAB — HEMOGLOBIN A1C
Hgb A1c MFr Bld: 8.9 % — ABNORMAL HIGH (ref <5.7)
MEAN PLASMA GLUCOSE: 209 mg/dL

## 2016-06-17 MED ORDER — BLOOD GLUCOSE MONITOR KIT: PACK | 0 refills | Status: DC

## 2016-06-17 NOTE — Telephone Encounter (Signed)
Christine Dominguez is asking for a override for her test strips so she can get them at [facility], please advise?

## 2016-06-17 NOTE — Telephone Encounter (Signed)
Dr Emi Holes office will do this

## 2016-06-23 ENCOUNTER — Ambulatory Visit: Payer: Commercial Managed Care - HMO | Admitting: "Endocrinology

## 2016-06-28 ENCOUNTER — Other Ambulatory Visit: Payer: Self-pay | Admitting: "Endocrinology

## 2016-07-05 ENCOUNTER — Telehealth: Payer: Self-pay | Admitting: Family Medicine

## 2016-07-05 NOTE — Telephone Encounter (Signed)
Christine Dominguez is c/o feet and legs swelling and itching really bad from the amLODipine (NORVASC) 5 MG tablet and stating that she needs to D/C this med, please advise?

## 2016-07-05 NOTE — Telephone Encounter (Signed)
pls sched work in appt with me this week, she needs to bring all meds to the appt that she is taking

## 2016-07-06 ENCOUNTER — Encounter: Payer: Self-pay | Admitting: "Endocrinology

## 2016-07-06 ENCOUNTER — Ambulatory Visit (INDEPENDENT_AMBULATORY_CARE_PROVIDER_SITE_OTHER): Payer: Commercial Managed Care - HMO | Admitting: "Endocrinology

## 2016-07-06 VITALS — BP 168/83 | HR 88 | Ht 61.0 in | Wt 168.0 lb

## 2016-07-06 DIAGNOSIS — E782 Mixed hyperlipidemia: Secondary | ICD-10-CM | POA: Diagnosis not present

## 2016-07-06 DIAGNOSIS — E039 Hypothyroidism, unspecified: Secondary | ICD-10-CM | POA: Diagnosis not present

## 2016-07-06 DIAGNOSIS — N183 Chronic kidney disease, stage 3 unspecified: Secondary | ICD-10-CM

## 2016-07-06 DIAGNOSIS — I10 Essential (primary) hypertension: Secondary | ICD-10-CM

## 2016-07-06 DIAGNOSIS — E1122 Type 2 diabetes mellitus with diabetic chronic kidney disease: Secondary | ICD-10-CM | POA: Diagnosis not present

## 2016-07-06 NOTE — Progress Notes (Signed)
Subjective:    Patient ID: Christine Dominguez, female    DOB: 1950-11-29, PCP Tula Nakayama, MD   Past Medical History:  Diagnosis Date  . Anemia   . Arthritis    Cervical spine  . Cancer ([place]) 1998, recurred in 2012   Paraganglioma of jugular tympamicum, recurrent in 2012  . Chronic back pain 2007   Disabled   . Essential hypertension, benign 1980  . GERD (gastroesophageal reflux disease) 2000  . Hydrocephalus    Treated with VP shunt and then intracranial tumor resection (?Cholesteatoma); no longer followed actively by neurosurgery or otolaryngology  . Hyperlipidemia 2013  . Hypothyroidism 2013  . Neuropathy ([place])   . Obesity   . Type 2 diabetes mellitus ([place]) 1991   Past Surgical History:  Procedure Laterality Date  . CATARACT EXTRACTION W/PHACO Right 07/17/2013   Procedure: CATARACT EXTRACTION PHACO AND INTRAOCULAR LENS PLACEMENT (IOC);  Surgeon: [name], MD;  Location: [facility];  Service: Ophthalmology;  Laterality: Right;  CDE:7.24  . COLONOSCOPY  06/2010  . Hemorrhoidectomy    . Intracranial mass resected  1998   At [facility]; ? location-right middle ear  . Teeth pulled    . TRIGGER FINGER RELEASE     Rght thumb  . TUBAL LIGATION  1980  . TUBAL LIGATION    . VENTRICULOPERITONEAL SHUNT  1998   At [place] History   Social History  . Marital status: Widowed    Spouse name: N/A  . Number of children: 3  . Years of education: N/A   Occupational History  . disabled     Social History Main Topics  . Smoking status: Former Smoker    Packs/day: 0.50    Years: 15.00    Types: Cigarettes    Quit date: 07/07/2001  . Smokeless tobacco: Never Used  . Alcohol use No  . Drug use: No  . Sexual activity: No     Comment: tubal   Other Topics Concern  . None   Social History Narrative   Mother of 3 - all children are deceased         Outpatient Encounter Prescriptions as of 07/06/2016  Medication Sig  . ACCU-CHEK FASTCLIX LANCETS MISC TEST  BLOOD SUGAR FOUR TIMES DAILY  . ACCU-CHEK SMARTVIEW test strip TEST 4 TIMES DAILY  . Alcohol Swabs (B-D SINGLE USE SWABS REGULAR) PADS TEST FOUR TIMES A DAY  . amLODipine (NORVASC) 5 MG tablet Take 1 tablet (5 mg total) by mouth daily.  Marland Kitchen aspirin EC 81 MG tablet Take 81 mg by mouth at bedtime.  . B-D ULTRAFINE III SHORT PEN 31G X 8 MM MISC USE  TO INJECT EVERY DAY  WITH  LANTUS  . Blood Glucose Calibration (ACCU-CHEK SMARTVIEW CONTROL) LIQD USE AS DIRECTED  . blood glucose meter kit and supplies KIT Dispense based on patient and insurance preference. Use up to four times daily as directed. (FOR ICD-10 E11.65)  . Blood Glucose Monitoring Suppl (ACCU-CHEK NANO SMARTVIEW) w/Device KIT Test 4 x daily. E11.65  . cloNIDine (CATAPRES) 0.1 MG tablet TAKE 1 TABLET TWICE DAILY  . docusate sodium (STOOL SOFTENER) 100 MG capsule Take 100 mg by mouth as needed for mild constipation.  . ferrous sulfate 325 (65 FE) MG EC tablet Take 325 mg by mouth daily.  Marland Kitchen gabapentin (NEURONTIN) 100 MG capsule TAKE 1 CAPSULE TWICE DAILY  . glucose (SUNMARK GLUCOSE) 4 GM chewable tablet Chew by mouth as needed.   Marland Kitchen  hydrochlorothiazide (HYDRODIURIL) 25 MG tablet Take 1 tablet (25 mg total) by mouth daily.  . Insulin Glargine (LANTUS SOLOSTAR) 100 UNIT/ML Solostar Pen Inject 50 Units into the skin at bedtime.  Marland Kitchen levothyroxine (SYNTHROID, LEVOTHROID) 88 MCG tablet TAKE 1 TABLET EVERY DAY BEFORE BREAKFAST  . metoCLOPramide (REGLAN) 10 MG tablet Take 0.5 tablets (5 mg total) by mouth every 6 (six) hours as needed for nausea (nausea/headache).  . nystatin (MYCOSTATIN/NYSTOP) powder APPLY TOPICALLY TO RASH ON LOWER ABDOMEN AT BEDTIME AS NEEDED  . omeprazole (PRILOSEC) 40 MG capsule Take 1 capsule (40 mg total) by mouth daily.  . potassium chloride (K-DUR) 10 MEQ tablet Take 1 tablet (10 mEq total) by mouth 2 (two) times daily.  . potassium chloride (K-DUR,KLOR-CON) 10 MEQ tablet Take 1 tablet (10 mEq total) by mouth 2 (two) times  daily.  . potassium chloride (KLOR-CON 10) 10 MEQ tablet Take 1 tablet (10 mEq total) by mouth 2 (two) times daily.  . Simethicone (GAS RELIEF DROPS PO) Take by mouth as needed.  . simvastatin (ZOCOR) 10 MG tablet TAKE 1 TABLET AT BEDTIME  . sodium chloride (OCEAN) 0.65 % nasal spray 1 spray as needed.   Marland Kitchen tiZANidine (ZANAFLEX) 2 MG tablet Take 1 tablet (2 mg total) by mouth daily as needed for muscle spasms.  . TRADJENTA 5 MG TABS tablet TAKE 1 TABLET EVERY DAY  . valsartan (DIOVAN) 320 MG tablet TAKE 1 TABLET EVERY DAY (DOSE INCREASE)  . Vitamin D, Ergocalciferol, (DRISDOL) 50000 units CAPS capsule TAKE 1 CAPSULE EVERY 7 DAYS   No facility-administered encounter medications on file as of 07/06/2016.    ALLERGIES: Allergies  Allergen Reactions  . Amaryl   . Avandia [Rosiglitazone Maleate]   . Glimepiride Itching  . Glipizide Itching  . Invokana [Canagliflozin]   . Lyrica [Pregabalin] Nausea And Vomiting  . Novolog [Insulin Aspart]   . Rosiglitazone     Other reaction(s): OTHER  . Sitagliptin     Other reaction(s): OTHER  . Aspirin Nausea Only and Other (See Comments)    Other Reaction: GI Upset; CAN TAKE 81 mg More of a burning feeling when she take it Burning in stomach, nausea   VACCINATION STATUS: Immunization History  Administered Date(s) Administered  . Influenza Split 02/15/2012  . Influenza Whole 01/06/2010  . Influenza,inj,Quad PF,36+ Mos 05/14/2013, 01/17/2014, 04/03/2015, 12/17/2015  . Pneumococcal Conjugate-13 08/08/2014  . Pneumococcal Polysaccharide-23 10/13/2009, 08/12/2015  . Tdap 08/24/2005  . Zoster 10/23/2012    Diabetes  She presents for her follow-up diabetic visit. She has type 2 diabetes mellitus. Her disease course has been worsening. There are no hypoglycemic associated symptoms. Pertinent negatives for hypoglycemia include no confusion, headaches, pallor or seizures. Associated symptoms include fatigue, polydipsia and polyuria. Pertinent negatives  for diabetes include no chest pain and no polyphagia. There are no hypoglycemic complications. Symptoms are worsening. Diabetic complications include nephropathy. Risk factors for coronary artery disease include dyslipidemia, diabetes mellitus and sedentary lifestyle. Current diabetic treatment includes insulin injections. She is compliant with treatment some of the time. Her weight is stable. She is following a generally unhealthy diet. When asked about meal planning, she reported none. She has had a previous visit with a dietitian. She rarely participates in exercise. Her home blood glucose trend is increasing steadily. Her breakfast blood glucose range is generally 180-200 mg/dl. Her overall blood glucose range is 180-200 mg/dl. (She denies hypoglycemia, but she worries about it . ) An ACE inhibitor/angiotensin II receptor blocker is being taken.  Hyperlipidemia  This is a chronic problem. The current episode started more than 1 year ago. Exacerbating diseases include diabetes, hypothyroidism and obesity. Pertinent negatives include no chest pain, myalgias or shortness of breath. Current antihyperlipidemic treatment includes statins.  Hypertension  This is a chronic problem. The current episode started more than 1 year ago. Pertinent negatives include no chest pain, headaches, palpitations or shortness of breath. Risk factors for coronary artery disease include diabetes mellitus, dyslipidemia, obesity and sedentary lifestyle.      Review of Systems  Constitutional: Positive for fatigue. Negative for chills, fever and unexpected weight change.  HENT: Negative for trouble swallowing and voice change.   Eyes: Negative for visual disturbance.  Respiratory: Negative for cough, shortness of breath and wheezing.   Cardiovascular: Negative for chest pain, palpitations and leg swelling.  Gastrointestinal: Negative for diarrhea, nausea and vomiting.  Endocrine: Positive for polydipsia and polyuria. Negative  for cold intolerance, heat intolerance and polyphagia.  Musculoskeletal: Negative for arthralgias and myalgias.  Skin: Negative for color change, pallor, rash and wound.  Neurological: Negative for seizures and headaches.  Psychiatric/Behavioral: Negative for confusion and suicidal ideas.    Objective:    BP (!) 168/83   Pulse 88   Ht '5\' 1"'$  (1.549 m)   Wt 168 lb (76.2 kg)   BMI 31.74 kg/m   Wt Readings from Last 3 Encounters:  07/06/16 168 lb (76.2 kg)  06/09/16 169 lb (76.7 kg)  04/28/16 166 lb (75.3 kg)    Physical Exam  Constitutional: She is oriented to person, place, and time. She appears well-developed.  HENT:  Head: Normocephalic and atraumatic.  Eyes: EOM are normal.  Neck: Normal range of motion. Neck supple. No tracheal deviation present. No thyromegaly present.  Cardiovascular: Normal rate and regular rhythm.   Pulmonary/Chest: Effort normal and breath sounds normal.  Abdominal: Soft. Bowel sounds are normal. There is no tenderness. There is no guarding.  Musculoskeletal: Normal range of motion. She exhibits no edema.  Neurological: She is alert and oriented to person, place, and time. She has normal reflexes. No cranial nerve deficit. Coordination normal.  Skin: Skin is warm and dry. No rash noted. No erythema. No pallor.  Psychiatric: She has a normal mood and affect. Judgment normal.    Results for orders placed or performed in visit on 06/16/16  Comprehensive metabolic panel  Result Value Ref Range   Sodium 138 135 - 146 mmol/L   Potassium 4.5 3.5 - 5.3 mmol/L   Chloride 102 98 - 110 mmol/L   CO2 28 20 - 31 mmol/L   Glucose, Bld 152 (H) 65 - 99 mg/dL   BUN 19 7 - 25 mg/dL   Creat 1.41 (H) 0.50 - 0.99 mg/dL   Total Bilirubin 0.3 0.2 - 1.2 mg/dL   Alkaline Phosphatase 68 33 - 130 U/L   AST 11 10 - 35 U/L   ALT 10 6 - 29 U/L   Total Protein 7.7 6.1 - 8.1 g/dL   Albumin 3.9 3.6 - 5.1 g/dL   Calcium 9.7 8.6 - 10.4 mg/dL  TSH  Result Value Ref Range    TSH 0.14 (L) mIU/L  T4, free  Result Value Ref Range   Free T4 1.2 0.8 - 1.8 ng/dL  Hemoglobin A1c  Result Value Ref Range   Hgb A1c MFr Bld 8.9 (H) <5.7 %   Mean Plasma Glucose 209 mg/dL   Diabetic Labs (most recent): Lab Results  Component Value Date   HGBA1C 8.9 (  H) 06/16/2016   HGBA1C 8.2 (H) 03/17/2016   HGBA1C 8.8 (H) 12/10/2015   Lipid Panel     Component Value Date/Time   CHOL 109 (L) 01/06/2016 0723   TRIG 111 01/06/2016 0723   HDL 36 (L) 01/06/2016 0723   CHOLHDL 3.0 01/06/2016 0723   VLDL 22 01/06/2016 0723   LDLCALC 51 01/06/2016 0723     Assessment & Plan:   1. Diabetes mellitus with stage 3 chronic kidney disease (HCC) -She remains at a high risk for more acute and chronic complications of diabetes which include CAD, CVA, CKD, retinopathy, and neuropathy. These are all discussed in detail with the patient.  Patient came with  above target glucose profile, and  recent A1c of  8.9% increasing from 8.2%. Glucose logs and insulin administration records pertaining to this visit,  to be scanned into patient's records.  Recent labs reviewed.   - I have re-counseled the patient on diet management andweight loss  by adopting a carbohydrate restricted / protein rich  Diet.  - Suggestion is made for patient to avoid simple carbohydrates   from their diet including Cakes , Desserts, Ice Cream,  Soda (  diet and regular) , Sweet Tea , Candies,  Chips, Cookies, Artificial Sweeteners,   and "Sugar-free" Products .  This will help patient to have stable blood glucose profile and potentially avoid unintended  Weight gain.  - Patient is advised to stick to a routine mealtimes to eat 3 meals  a day and avoid unnecessary snacks ( to snack only to correct hypoglycemia).  - The patient will be  scheduled with Norm Salt, RDN, CDE for individualized DM education.  - I have approached patient with the following individualized plan to manage diabetes and patient agrees.  She  does not want to tighten control ,  due to fear of hypoglycemia.  - She reluctantly accepts to increase her Lantus to 50 units  every morning with breakfast associated with monitoring her blood glucose twice a day, before breakfast and at bedtime. -Continue Tradjenta 5 mg by mouth every morning.   - She did not like Invokana .  - Patient specific target  for A1c; LDL, HDL, Triglycerides, and  Waist Circumference were discussed in detail.  2) BP/HTN: uncontrolled. Her blood pressure medications were recently changed due to intolerance to amlodipine. Her hydrochlorothiazide was increased to 25 mg by mouth daily this last week.  I have advised her to continue clonidine 0.1 mg by mouth twice a day as well.   3) Lipids/HPL:  continue statins. 4)  Weight/Diet: CDE consult in progress, exercise, and carbohydrates information provided.  5) hypothyroidism:  her last thyroid function tests are consistent with appropriate replacement. I will  continue Levothyroxine 88 g by mouth every morning.   - We discussed about correct intake of levothyroxine, at fasting, with water, separated by at least 30 minutes from breakfast, and separated by more than 4 hours from calcium, iron, multivitamins, acid reflux medications (PPIs). -Patient is made aware of the fact that thyroid hormone replacement is needed for life, dose to be adjusted by periodic monitoring of thyroid function tests.  6) Chronic Care/Health Maintenance:  -Patient is on ACEI/ARB and Statin medications and encouraged to continue to follow up with Ophthalmology, Podiatrist at least yearly or according to recommendations, and advised to  stay away from smoking. I have recommended yearly flu vaccine and pneumonia vaccination at least every 5 years; moderate intensity exercise for up to 150 minutes weekly;  and  sleep for at least 7 hours a day.  - 25 minutes of time was spent on the care of this patient , 50% of which was applied for counseling on  diabetes complications and their preventions.  - I advised patient to maintain close follow up with Tula Nakayama, MD for primary care needs.  Patient is asked to bring meter and  blood glucose logs during their next visit.   Follow up plan: -Return in about 3 months (around 10/05/2016) for follow up with pre-visit labs, meter, and logs.  Glade Lloyd, MD Phone: [phone]  Fax: [phone]   07/06/2016, 2:45 PM

## 2016-07-06 NOTE — Patient Instructions (Signed)

## 2016-07-07 ENCOUNTER — Encounter: Payer: Self-pay | Admitting: Family Medicine

## 2016-07-07 ENCOUNTER — Ambulatory Visit (INDEPENDENT_AMBULATORY_CARE_PROVIDER_SITE_OTHER): Payer: Medicare HMO | Admitting: Family Medicine

## 2016-07-07 VITALS — BP 148/78 | HR 80 | Resp 16 | Ht 61.0 in | Wt 167.0 lb

## 2016-07-07 DIAGNOSIS — E1122 Type 2 diabetes mellitus with diabetic chronic kidney disease: Secondary | ICD-10-CM | POA: Diagnosis not present

## 2016-07-07 DIAGNOSIS — N183 Chronic kidney disease, stage 3 (moderate): Secondary | ICD-10-CM | POA: Diagnosis not present

## 2016-07-07 DIAGNOSIS — I10 Essential (primary) hypertension: Secondary | ICD-10-CM

## 2016-07-07 DIAGNOSIS — E782 Mixed hyperlipidemia: Secondary | ICD-10-CM

## 2016-07-07 NOTE — Patient Instructions (Addendum)
f/u in May as before, call if you need me sooner   Blood pressure is satisfactory no additional medication at this time  It is important that you exercise regularly at least 30 minutes 5 times a week. If you develop chest pain, have severe difficulty breathing, or feel very tired, stop exercising immediately and seek medical attention    DASH Eating Plan DASH stands for "Dietary Approaches to Stop Hypertension." The DASH eating plan is a healthy eating plan that has been shown to reduce high blood pressure (hypertension). It may also reduce your risk for type 2 diabetes, heart disease, and stroke. The DASH eating plan may also help with weight loss. What are tips for following this plan? General guidelines   Avoid eating more than 2,300 mg (milligrams) of salt (sodium) a day. If you have hypertension, you may need to reduce your sodium intake to 1,500 mg a day.  Limit alcohol intake to no more than 1 drink a day for nonpregnant women and 2 drinks a day for men. One drink equals 12 oz of beer, 5 oz of wine, or 1 oz of hard liquor.  Work with your health care provider to maintain a healthy body weight or to lose weight. Ask what an ideal weight is for you.  Get at least 30 minutes of exercise that causes your heart to beat faster (aerobic exercise) most days of the week. Activities may include walking, swimming, or biking.  Work with your health care provider or diet and nutrition specialist (dietitian) to adjust your eating plan to your individual calorie needs. Reading food labels   Check food labels for the amount of sodium per serving. Choose foods with less than 5 percent of the Daily Value of sodium. Generally, foods with less than 300 mg of sodium per serving fit into this eating plan.  To find whole grains, look for the word "whole" as the first word in the ingredient list. Shopping   Buy products labeled as "low-sodium" or "no salt added."  Buy fresh foods. Avoid canned foods  and premade or frozen meals. Cooking   Avoid adding salt when cooking. Use salt-free seasonings or herbs instead of table salt or sea salt. Check with your health care provider or pharmacist before using salt substitutes.  Do not fry foods. Cook foods using healthy methods such as baking, boiling, grilling, and broiling instead.  Cook with heart-healthy oils, such as olive, canola, soybean, or sunflower oil. Meal planning    Eat a balanced diet that includes:  5 or more servings of fruits and vegetables each day. At each meal, try to fill half of your plate with fruits and vegetables.  Up to 6-8 servings of whole grains each day.  Less than 6 oz of lean meat, poultry, or fish each day. A 3-oz serving of meat is about the same size as a deck of cards. One egg equals 1 oz.  2 servings of low-fat dairy each day.  A serving of nuts, seeds, or beans 5 times each week.  Heart-healthy fats. Healthy fats called Omega-3 fatty acids are found in foods such as flaxseeds and coldwater fish, like sardines, salmon, and mackerel.  Limit how much you eat of the following:  Canned or prepackaged foods.  Food that is high in trans fat, such as fried foods.  Food that is high in saturated fat, such as fatty meat.  Sweets, desserts, sugary drinks, and other foods with added sugar.  Full-fat dairy products.  Do  not salt foods before eating.  Try to eat at least 2 vegetarian meals each week.  Eat more home-cooked food and less restaurant, buffet, and fast food.  When eating at a restaurant, ask that your food be prepared with less salt or no salt, if possible. What foods are recommended? The items listed may not be a complete list. Talk with your dietitian about what dietary choices are best for you. Grains  Whole-grain or whole-wheat bread. Whole-grain or whole-wheat pasta. Brown rice. [place] Morrow. Bulgur. Whole-grain and low-sodium cereals. Pita bread. Low-fat, low-sodium crackers.  Whole-wheat flour tortillas. Vegetables  Fresh or frozen vegetables (raw, steamed, roasted, or grilled). Low-sodium or reduced-sodium tomato and vegetable juice. Low-sodium or reduced-sodium tomato sauce and tomato paste. Low-sodium or reduced-sodium canned vegetables. Fruits  All fresh, dried, or frozen fruit. Canned fruit in natural juice (without added sugar). Meat and other protein foods  Skinless chicken or [place]. Ground chicken or [place]. Pork with fat trimmed off. Fish and seafood. Egg whites. Dried beans, peas, or lentils. Unsalted nuts, nut butters, and seeds. Unsalted canned beans. Lean cuts of beef with fat trimmed off. Low-sodium, lean deli meat. Dairy  Low-fat (1%) or fat-free (skim) milk. Fat-free, low-fat, or reduced-fat cheeses. Nonfat, low-sodium ricotta or cottage cheese. Low-fat or nonfat yogurt. Low-fat, low-sodium cheese. Fats and oils  Soft margarine without trans fats. Vegetable oil. Low-fat, reduced-fat, or light mayonnaise and salad dressings (reduced-sodium). Canola, safflower, olive, soybean, and sunflower oils. Avocado. Seasoning and other foods  Herbs. Spices. Seasoning mixes without salt. Unsalted popcorn and pretzels. Fat-free sweets. What foods are not recommended? The items listed may not be a complete list. Talk with your dietitian about what dietary choices are best for you. Grains  Baked goods made with fat, such as croissants, muffins, or some breads. Dry pasta or rice meal packs. Vegetables  Creamed or fried vegetables. Vegetables in a cheese sauce. Regular canned vegetables (not low-sodium or reduced-sodium). Regular canned tomato sauce and paste (not low-sodium or reduced-sodium). Regular tomato and vegetable juice (not low-sodium or reduced-sodium). Angie Fava. Olives. Fruits  Canned fruit in a light or heavy syrup. Fried fruit. Fruit in cream or butter sauce. Meat and other protein foods  Fatty cuts of meat. Ribs. Fried meat. Berniece Salines. Sausage. Bologna and  other processed lunch meats. Salami. Fatback. Hotdogs. Bratwurst. Salted nuts and seeds. Canned beans with added salt. Canned or smoked fish. Whole eggs or egg yolks. Chicken or [place] with skin. Dairy  Whole or 2% milk, cream, and half-and-half. Whole or full-fat cream cheese. Whole-fat or sweetened yogurt. Full-fat cheese. Nondairy creamers. Whipped toppings. Processed cheese and cheese spreads. Fats and oils  Butter. Stick margarine. Lard. Shortening. Ghee. Bacon fat. Tropical oils, such as coconut, palm kernel, or palm oil. Seasoning and other foods  Salted popcorn and pretzels. Onion salt, garlic salt, seasoned salt, table salt, and sea salt. Worcestershire sauce. Tartar sauce. Barbecue sauce. Teriyaki sauce. Soy sauce, including reduced-sodium. Steak sauce. Canned and packaged gravies. Fish sauce. Oyster sauce. Cocktail sauce. Horseradish that you find on the shelf. Ketchup. Mustard. Meat flavorings and tenderizers. Bouillon cubes. Hot sauce and Tabasco sauce. Premade or packaged marinades. Premade or packaged taco seasonings. Relishes. Regular salad dressings. Where to find more information:  National Heart, Lung, and [place]: https://wilson-eaton.com/  American Heart Association: www.heart.org Summary  The DASH eating plan is a healthy eating plan that has been shown to reduce high blood pressure (hypertension). It may also reduce your risk for type 2 diabetes, heart disease,  and stroke.  With the DASH eating plan, you should limit salt (sodium) intake to 2,300 mg a day. If you have hypertension, you may need to reduce your sodium intake to 1,500 mg a day.  When on the DASH eating plan, aim to eat more fresh fruits and vegetables, whole grains, lean proteins, low-fat dairy, and heart-healthy fats.  Work with your health care provider or diet and nutrition specialist (dietitian) to adjust your eating plan to your individual calorie needs. This information is not intended to replace advice  given to you by your health care provider. Make sure you discuss any questions you have with your health care provider. Document Released: 03/11/2011 Document Revised: 03/15/2016 Document Reviewed: 03/15/2016 Elsevier Interactive Patient Education  2017 [employer].

## 2016-07-09 ENCOUNTER — Other Ambulatory Visit: Payer: Self-pay | Admitting: "Endocrinology

## 2016-07-11 NOTE — Assessment & Plan Note (Signed)
Uncontrolled and managed by endo, reports labile blood sugars, encouraged to follow treatment plan per endo and call with concerns to Dr Dorris Fetch

## 2016-07-11 NOTE — Assessment & Plan Note (Signed)
Hyperlipidemia:Low fat diet discussed and encouraged.   Lipid Panel  Lab Results  Component Value Date   CHOL 109 (L) 01/06/2016   HDL 36 (L) 01/06/2016   LDLCALC 51 01/06/2016   TRIG 111 01/06/2016   CHOLHDL 3.0 01/06/2016   Updated lab needed at/ before next visit.  \

## 2016-07-11 NOTE — Assessment & Plan Note (Signed)
Adequate though sub optimal control, reports intolerance to amlodipine, rash and leg swelling, has not taken for over 72 hours. No med change DASH diet and commitment to daily physical activity for a minimum of 30 minutes discussed and encouraged, as a part of hypertension management. The importance of attaining a healthy weight is also discussed.  BP/Weight 07/07/2016 07/06/2016 06/09/2016 04/28/2016 04/28/2016 04/14/2016 47/99/8721  Systolic BP 587 276 184 859 276 394 320  Diastolic BP 78 83 74 74 74 81 78  Wt. (Lbs) 167 168 169 166 166.12 - 170  BMI 31.55 31.74 31.93 31.37 31.39 - 32.12   Discontinue amlodipine

## 2016-07-11 NOTE — Progress Notes (Signed)
   Christine Dominguez     MRN: [id number]      DOB: 05/23/1950   HPI [name] is here regarding amlodipine, states causing leg swelling and rash and she d/c med 3 days ago. Also c/o uncontrolled blood sugar with multiple low values contemplating Md change but "will watch for next 3 months" c/o back  pain appreciative of tylenol samples given ROS Denies recent fever or chills. Denies sinus pressure, nasal congestion, ear pain or sore throat. Denies chest congestion, productive cough or wheezing. Denies chest pains, palpitations PND or orthopnea Denies abdominal pain, nausea, vomiting,diarrhea or constipation.   Denies dysuria, frequency, hesitancy or incontinence.  Denies headaches, seizures, numbness, or tingling. Denies depression, anxiety or insomnia. Denies skin break down or rash.   PE  BP (!) 148/78   Pulse 80   Resp 16   Ht 5\' 1"  (1.549 m)   Wt 167 lb (75.8 kg)   BMI 31.55 kg/m   Patient alert and oriented and in no cardiopulmonary distress.  HEENT: No facial asymmetry, EOMI,   oropharynx pink and moist.  Neck supple no JVD, no mass.  Chest: Clear to auscultation bilaterally.  CVS: S1, S2 no murmurs, no S3.Regular rate.  ABD: Soft non tender.   Ext: No edema  MS: Adequate though reduced  ROM spine, shoulders, hips and knees.  Skin: Intact, no ulcerations or rash noted.  Psych: Good eye contact, normal affect. Memory intact not anxious or depressed appearing.  CNS: CN 2-12 intact, power,  normal throughout.no focal deficits noted.   Assessment & Plan  Essential hypertension, benign Adequate though sub optimal control, reports intolerance to amlodipine, rash and leg swelling, has not taken for over 72 hours. No med change DASH diet and commitment to daily physical activity for a minimum of 30 minutes discussed and encouraged, as a part of hypertension management. The importance of attaining a healthy weight is also discussed.  BP/Weight  07/07/2016 07/06/2016 06/09/2016 04/28/2016 04/28/2016 04/14/2016 64/15/8309  Systolic BP 407 680 881 103 159 458 592  Diastolic BP 78 83 74 74 74 81 78  Wt. (Lbs) 167 168 169 166 166.12 - 170  BMI 31.55 31.74 31.93 31.37 31.39 - 32.12   Discontinue amlodipine   Mixed hyperlipidemia Hyperlipidemia:Low fat diet discussed and encouraged.   Lipid Panel  Lab Results  Component Value Date   CHOL 109 (L) 01/06/2016   HDL 36 (L) 01/06/2016   LDLCALC 51 01/06/2016   TRIG 111 01/06/2016   CHOLHDL 3.0 01/06/2016   Updated lab needed at/ before next visit.  \  Diabetes mellitus with stage 3 chronic kidney disease ([place]) Uncontrolled and managed by endo, reports labile blood sugars, encouraged to follow treatment plan per endo and call with concerns to Dr Dorris Fetch

## 2016-07-12 NOTE — Telephone Encounter (Signed)
Patient was seen 07/07/16

## 2016-07-14 ENCOUNTER — Other Ambulatory Visit: Payer: Self-pay | Admitting: Family Medicine

## 2016-07-14 ENCOUNTER — Encounter: Payer: Self-pay | Admitting: Podiatry

## 2016-07-14 ENCOUNTER — Ambulatory Visit (INDEPENDENT_AMBULATORY_CARE_PROVIDER_SITE_OTHER): Payer: Medicare HMO | Admitting: Podiatry

## 2016-07-14 ENCOUNTER — Other Ambulatory Visit: Payer: Self-pay

## 2016-07-14 DIAGNOSIS — E1151 Type 2 diabetes mellitus with diabetic peripheral angiopathy without gangrene: Secondary | ICD-10-CM

## 2016-07-14 DIAGNOSIS — B351 Tinea unguium: Secondary | ICD-10-CM

## 2016-07-14 DIAGNOSIS — M79676 Pain in unspecified toe(s): Secondary | ICD-10-CM

## 2016-07-14 MED ORDER — LINAGLIPTIN 5 MG PO TABS: 5.0000 mg | ORAL_TABLET | Freq: Every day | ORAL | 0 refills | Status: DC

## 2016-07-14 NOTE — Progress Notes (Signed)
Patient ID: Christine Dominguez, female   DOB: May 02, 1950, 66 y.o.   MRN: [id number]     Subjective: This patient presents complaining of painful toenails and requesting nail debridement  Objective: Orientated 3 No open skin lesions bilaterally DP and PT pulses 1/4 bilaterally Nonpitting edema bilaterally Capillary reflex immediate bilaterally Sensation to 10 g monofilament wire intact 5/5 bilaterally Vibratory sensation intact bilaterally Ankle reflex equal and reactive bilaterally Plantar callus fifth MPJ -minimally bilaterally HAV right Hammertoe third right Atrophic skin without hair growth bilaterally The toenails are hypertrophic, elongated, incurvated, discolored and tender direct palpation 6-10 No open skin lesions bilaterally  Assessment: Insulin-dependent diabetic Decrease pedal pulses bilaterally, peripheral arterial disease Protective sensation intact bilaterally Symptomatic onychomycoses 6-10  Plan: Debridement toenails 10 mechanically an electrical without any bleeding  \ Reappoint 3 months

## 2016-07-14 NOTE — Patient Instructions (Signed)

## 2016-07-21 ENCOUNTER — Other Ambulatory Visit: Payer: Self-pay | Admitting: Cardiology

## 2016-07-23 ENCOUNTER — Telehealth: Payer: Self-pay | Admitting: Family Medicine

## 2016-07-26 NOTE — Telephone Encounter (Signed)
Patient says she received a call from [facility] telling her its time to refill her Vitamin D.  She wanted to make sure you she received the fax. Please call [phone] to discuss.

## 2016-07-26 NOTE — Telephone Encounter (Signed)
Refilled

## 2016-07-29 ENCOUNTER — Encounter (HOSPITAL_COMMUNITY): Payer: Self-pay | Admitting: Emergency Medicine

## 2016-07-29 ENCOUNTER — Emergency Department (HOSPITAL_COMMUNITY)
Admission: EM | Admit: 2016-07-29 | Discharge: 2016-07-29 | Disposition: A | Discharge status: Home / Self Care | Payer: Medicare HMO | Attending: Emergency Medicine | Admitting: Emergency Medicine

## 2016-07-29 DIAGNOSIS — E039 Hypothyroidism, unspecified: Secondary | ICD-10-CM | POA: Diagnosis not present

## 2016-07-29 DIAGNOSIS — E119 Type 2 diabetes mellitus without complications: Secondary | ICD-10-CM | POA: Diagnosis not present

## 2016-07-29 DIAGNOSIS — Z794 Long term (current) use of insulin: Secondary | ICD-10-CM | POA: Diagnosis not present

## 2016-07-29 DIAGNOSIS — Z87891 Personal history of nicotine dependence: Secondary | ICD-10-CM | POA: Diagnosis not present

## 2016-07-29 DIAGNOSIS — Z79899 Other long term (current) drug therapy: Secondary | ICD-10-CM | POA: Diagnosis not present

## 2016-07-29 DIAGNOSIS — R42 Dizziness and giddiness: Secondary | ICD-10-CM | POA: Diagnosis not present

## 2016-07-29 DIAGNOSIS — I10 Essential (primary) hypertension: Secondary | ICD-10-CM | POA: Diagnosis not present

## 2016-07-29 DIAGNOSIS — Z7982 Long term (current) use of aspirin: Secondary | ICD-10-CM | POA: Insufficient documentation

## 2016-07-29 NOTE — ED Notes (Signed)
Pt called twice for VS and update. No answer. Pt not in lobby

## 2016-07-29 NOTE — ED Notes (Signed)
Pt states she is about ready to go home because she has things to do and is hungry.  Advised pt she is next to go back.

## 2016-07-29 NOTE — ED Triage Notes (Signed)
Pt reports her bp was reading low at home.  States it has been up and down.

## 2016-07-29 NOTE — ED Provider Notes (Signed)
[facility] DEPT Provider Note   CSN: [id number] Arrival date & time: 07/29/16  1705  By signing my name below, I, Margit Banda, attest that this documentation has been prepared under the direction and in the presence of Forde Dandy, MD. Electronically Signed: Margit Banda, ED Scribe. 07/29/16. 9:12 PM.   History   Chief Complaint Chief Complaint  Patient presents with  . Hypertension    HPI JAMAIYA TUNNELL is a 66 y.o. female who presents to the Emergency Department complaining of sudden changes in her BP that started earlier today (07/29/16). Pt notes she was feeling "woozy" and decided to take her BP. She notes her BP readings where up and down. Her initial BP 99 SBP.  Eating salt helped to temporarily raise her BP. Pt denies CP, SOB, cough, nausea, vomiting, no blood in stool, and no edema of the bilateral legs. No recent illness. No abdominal pain, syncope. No new medication changes. Has been compliant with blood pressure medications. Currently feels normal.   PCP: Tula Nakayama, MD  HPI  Past Medical History:  Diagnosis Date  . Anemia   . Arthritis    Cervical spine  . Cancer ([place]) 1998, recurred in 2012   Paraganglioma of jugular tympamicum, recurrent in 2012  . Chronic back pain 2007   Disabled   . Essential hypertension, benign 1980  . GERD (gastroesophageal reflux disease) 2000  . Hydrocephalus    Treated with VP shunt and then intracranial tumor resection (?Cholesteatoma); no longer followed actively by neurosurgery or otolaryngology  . Hyperlipidemia 2013  . Hypothyroidism 2013  . Neuropathy   . Obesity   . Type 2 diabetes mellitus ([place]) 1991    Patient Active Problem List   Diagnosis Date Noted  . Rectocele 12/31/2013  . Paraganglioma ([place]) 08/01/2013  . Primary hypothyroidism 11/14/2011  . Mixed hyperlipidemia 11/14/2011  . GERD (gastroesophageal reflux disease) 11/03/2011  . Hearing loss in right ear 11/24/2010  . Diabetes  mellitus with stage 3 chronic kidney disease ([place]) 06/11/2010  . Back pain 11/18/2009  . CARPAL TUNNEL SYNDROME 10/19/2009  . Essential hypertension, benign 10/13/2009    Past Surgical History:  Procedure Laterality Date  . CATARACT EXTRACTION W/PHACO Right 07/17/2013   Procedure: CATARACT EXTRACTION PHACO AND INTRAOCULAR LENS PLACEMENT (IOC);  Surgeon: [name], MD;  Location: [facility];  Service: Ophthalmology;  Laterality: Right;  CDE:7.24  . COLONOSCOPY  06/2010  . Hemorrhoidectomy    . Intracranial mass resected  1998   At [facility]; ? location-right middle ear  . Teeth pulled    . TRIGGER FINGER RELEASE     Rght thumb  . TUBAL LIGATION  1980  . TUBAL LIGATION    . VENTRICULOPERITONEAL SHUNT  1998   At [facility]    OB History    Gravida Para Term Preterm AB Living   '3 2     1 1   '$ SAB TAB Ectopic Multiple Live Births       1   1       Home Medications    Prior to Admission medications   Medication Sig Start Date End Date Taking? Authorizing Provider  ACCU-CHEK FASTCLIX LANCETS MISC TEST BLOOD SUGAR FOUR TIMES DAILY 05/21/16   Cassandria Anger, MD  ACCU-CHEK SMARTVIEW test strip TEST 4 TIMES DAILY 06/10/16   Cassandria Anger, MD  Alcohol Swabs (B-D SINGLE USE SWABS REGULAR) PADS TEST FOUR TIMES A DAY 07/07/15   Cassandria Anger, MD  aspirin EC 81  MG tablet Take 81 mg by mouth at bedtime.    Historical Provider, MD  B-D ULTRAFINE III SHORT PEN 31G X 8 MM MISC USE  TO INJECT EVERY DAY  WITH  LANTUS 04/12/16   Cassandria Anger, MD  Blood Glucose Calibration (ACCU-CHEK SMARTVIEW CONTROL) LIQD USE AS DIRECTED 02/10/16   Cassandria Anger, MD  blood glucose meter kit and supplies KIT Dispense based on patient and insurance preference. Use up to four times daily as directed. (FOR ICD-10 E11.65) 06/17/16   Cassandria Anger, MD  Blood Glucose Monitoring Suppl (ACCU-CHEK NANO SMARTVIEW) w/Device KIT Test 4 x daily. E11.65 04/15/16   Cassandria Anger, MD  cloNIDine  (CATAPRES) 0.1 MG tablet TAKE 1 TABLET TWICE DAILY 07/22/16   Satira Sark, MD  docusate sodium (STOOL SOFTENER) 100 MG capsule Take 100 mg by mouth as needed for mild constipation.    Historical Provider, MD  ferrous sulfate 325 (65 FE) MG EC tablet Take 325 mg by mouth daily.    Historical Provider, MD  gabapentin (NEURONTIN) 100 MG capsule TAKE 1 CAPSULE TWICE DAILY 08/12/15   [name], MD  glucose [facility] GLUCOSE) 4 GM chewable tablet Chew by mouth as needed.  07/29/11   Historical Provider, MD  hydrochlorothiazide (HYDRODIURIL) 25 MG tablet Take 1 tablet (25 mg total) by mouth daily. 06/09/16   [name], MD  Insulin Glargine (LANTUS SOLOSTAR) 100 UNIT/ML Solostar Pen Inject 50 Units into the skin at bedtime. 06/28/16   Cassandria Anger, MD  levothyroxine (SYNTHROID, LEVOTHROID) 88 MCG tablet TAKE 1 TABLET EVERY DAY BEFORE BREAKFAST 04/12/16   Cassandria Anger, MD  linagliptin (TRADJENTA) 5 MG TABS tablet Take 1 tablet (5 mg total) by mouth daily. 07/14/16   Cassandria Anger, MD  nystatin (MYCOSTATIN/NYSTOP) powder APPLY TOPICALLY TO RASH ON LOWER ABDOMEN AT BEDTIME AS NEEDED 07/15/16   [name], MD  omeprazole (PRILOSEC) 40 MG capsule Take 1 capsule (40 mg total) by mouth daily. 03/15/16   [name], MD  potassium chloride (K-DUR) 10 MEQ tablet Take 1 tablet (10 mEq total) by mouth 2 (two) times daily. 06/09/16   [name], MD  Simethicone (GAS RELIEF DROPS PO) Take by mouth as needed.    Historical Provider, MD  simvastatin (ZOCOR) 10 MG tablet TAKE 1 TABLET AT BEDTIME 06/01/16   [name], MD  sodium chloride (OCEAN) 0.65 % nasal spray 1 spray as needed.  12/04/13   Historical Provider, MD  tiZANidine (ZANAFLEX) 2 MG tablet Take 1 tablet (2 mg total) by mouth daily as needed for muscle spasms. 12/12/15   [name], MD  valsartan (DIOVAN) 320 MG tablet TAKE 1 TABLET EVERY DAY (DOSE INCREASE) 04/12/16   [name], MD    Vitamin D, Ergocalciferol, (DRISDOL) 50000 units CAPS capsule TAKE 1 CAPSULE EVERY 7 DAYS 07/26/16   [name], MD    Family History Family History  Problem Relation Age of Onset  . Lung cancer Mother   . Hypertension Mother 31  . Hypertension Brother   . Diabetes Brother   . Diabetes Sister   . Kidney failure Brother   . Hypertension Maternal Grandmother   . Heart disease Maternal Grandmother   . Other Son     stillborn  . Other Son     died at birth  . Diabetes Maternal Grandfather   . Other Son     was murdered  Social History Social History  Substance Use Topics  . Smoking status: Former Smoker    Packs/day: 0.50    Years: 15.00    Types: Cigarettes    Quit date: 07/07/2001  . Smokeless tobacco: Never Used  . Alcohol use No     Allergies   Amlodipine; Amaryl; Avandia [rosiglitazone maleate]; Glimepiride; Glipizide; Invokana [canagliflozin]; Lyrica [pregabalin]; Novolog [insulin aspart]; Rosiglitazone; Sitagliptin; and Aspirin   Review of Systems Review of Systems  Respiratory: Negative for shortness of breath.   Cardiovascular: Negative for chest pain.  Gastrointestinal: Negative for blood in stool, nausea and vomiting.  Musculoskeletal: Negative for joint swelling.  Neurological: Positive for dizziness.  All other systems reviewed and are negative.    Physical Exam Updated Vital Signs BP (!) 173/85   Pulse 85   Temp 98.5 F (36.9 C) (Oral)   Resp 16   Ht '5\' 1"'$  (1.549 m)   Wt 167 lb (75.8 kg)   SpO2 100%   BMI 31.55 kg/m   Physical Exam  Physical Exam  Nursing note and vitals reviewed. Constitutional: Well developed, well nourished, non-toxic, and in no acute distress Head: Normocephalic and atraumatic.  Mouth/Throat: Oropharynx is clear and moist.  Neck: Normal range of motion. Neck supple.  Cardiovascular: Normal rate and regular rhythm.   Pulmonary/Chest: Effort normal and breath sounds normal.  Abdominal: Soft. There is no  tenderness. There is no rebound and no guarding.  Musculoskeletal: Normal range of motion.  Neurological: Alert, no facial droop, fluent speech, moves all extremities symmetrically Skin: Skin is warm and dry.  Psychiatric: Cooperative   ED Treatments / Results  DIAGNOSTIC STUDIES: Oxygen Saturation is 100% on RA, normal by my interpretation.   COORDINATION OF CARE: 9:05 PM-Discussed next steps with pt which includes calling her PCP tomorrow about her BP. She is to continue measuring her BP. Pt verbalized understanding and is agreeable with the plan.    Labs (all labs ordered are listed, but only abnormal results are displayed) Labs Reviewed - No data to display  EKG  EKG Interpretation None       Radiology No results found.  Procedures Procedures (including critical care time)  Medications Ordered in ED Medications - No data to display   Initial Impression / Assessment and Plan / ED Course  I have reviewed the triage vital signs and the nursing notes.  Pertinent labs & imaging results that were available during my care of the patient were reviewed by me and considered in my medical decision making (see chart for details).     66 year old female who presents with low blood pressure at home. Since she has been in the emergency department she has been hypertensive, and states that she is due for her evening medications. She is asymptomatic. She is well-appearing. Exam is nonfocal. Discussed that she will take her clonidine tonight, but hold her valsartan. She will call her PCP tomorrow to discuss any needed changes to her blood pressure medications.   Final Clinical Impressions(s) / ED Diagnoses   Final diagnoses:  Essential hypertension    New Prescriptions New Prescriptions   No medications on file     Forde Dandy, MD 07/29/16 2117

## 2016-07-29 NOTE — Discharge Instructions (Signed)
Please take only the clonidine tonight and hold the valsartan.  Call your PCP tomorrow to let them know your BP was low earlier today and question if there needs to be any changes to your blood pressure medication. Continue to check your blood pressure at home and keep a list to show your PCP. Return without fail for worsening symptoms, including passing out, chest pains, difficulty breathing, confusion, or any other symptoms concerning to you.

## 2016-08-02 ENCOUNTER — Ambulatory Visit: Payer: Medicare HMO

## 2016-08-02 ENCOUNTER — Other Ambulatory Visit: Payer: Self-pay | Admitting: "Endocrinology

## 2016-08-02 ENCOUNTER — Other Ambulatory Visit: Payer: Self-pay | Admitting: Family Medicine

## 2016-08-02 VITALS — BP 170/80

## 2016-08-02 DIAGNOSIS — I10 Essential (primary) hypertension: Secondary | ICD-10-CM

## 2016-08-02 NOTE — Progress Notes (Signed)
Pt advised to take whole diovan tablet as she had not been doing so and to keep her cardiology appt

## 2016-08-04 ENCOUNTER — Other Ambulatory Visit: Payer: Self-pay | Admitting: Family Medicine

## 2016-08-04 DIAGNOSIS — Z1231 Encounter for screening mammogram for malignant neoplasm of breast: Secondary | ICD-10-CM

## 2016-08-11 ENCOUNTER — Other Ambulatory Visit: Payer: Self-pay | Admitting: "Endocrinology

## 2016-08-19 DIAGNOSIS — I10 Essential (primary) hypertension: Secondary | ICD-10-CM | POA: Diagnosis not present

## 2016-08-19 DIAGNOSIS — D509 Iron deficiency anemia, unspecified: Secondary | ICD-10-CM | POA: Diagnosis not present

## 2016-08-19 DIAGNOSIS — E1122 Type 2 diabetes mellitus with diabetic chronic kidney disease: Secondary | ICD-10-CM | POA: Diagnosis not present

## 2016-08-19 DIAGNOSIS — N183 Chronic kidney disease, stage 3 (moderate): Secondary | ICD-10-CM | POA: Diagnosis not present

## 2016-08-19 DIAGNOSIS — E782 Mixed hyperlipidemia: Secondary | ICD-10-CM | POA: Diagnosis not present

## 2016-08-19 LAB — CBC
HCT: 33.8 % — ABNORMAL LOW (ref 35.0–45.0)
HEMOGLOBIN: 10.8 g/dL — AB (ref 11.7–15.5)
MCH: 26.5 pg — ABNORMAL LOW (ref 27.0–33.0)
MCHC: 32 g/dL (ref 32.0–36.0)
MCV: 83 fL (ref 80.0–100.0)
MPV: 10.6 fL (ref 7.5–12.5)
Platelets: 238 10*3/uL (ref 140–400)
RBC: 4.07 MIL/uL (ref 3.80–5.10)
RDW: 13.4 % (ref 11.0–15.0)
WBC: 8.2 10*3/uL (ref 3.8–10.8)

## 2016-08-19 LAB — COMPLETE METABOLIC PANEL WITH GFR
ALT: 11 U/L (ref 6–29)
AST: 12 U/L (ref 10–35)
Albumin: 3.9 g/dL (ref 3.6–5.1)
Alkaline Phosphatase: 66 U/L (ref 33–130)
BILIRUBIN TOTAL: 0.3 mg/dL (ref 0.2–1.2)
BUN: 20 mg/dL (ref 7–25)
CALCIUM: 9.3 mg/dL (ref 8.6–10.4)
CHLORIDE: 105 mmol/L (ref 98–110)
CO2: 26 mmol/L (ref 20–31)
CREATININE: 1.41 mg/dL — AB (ref 0.50–0.99)
GFR, EST AFRICAN AMERICAN: 45 mL/min — AB (ref >=60)
GFR, EST NON AFRICAN AMERICAN: 39 mL/min — AB (ref >=60)
Glucose, Bld: 164 mg/dL — ABNORMAL HIGH (ref 65–99)
Potassium: 4.7 mmol/L (ref 3.5–5.3)
Sodium: 139 mmol/L (ref 135–146)
Total Protein: 7.3 g/dL (ref 6.1–8.1)

## 2016-08-19 LAB — LIPID PANEL
CHOL/HDL RATIO: 2.9 ratio (ref <5.0)
CHOLESTEROL: 106 mg/dL (ref <200)
HDL: 37 mg/dL — AB (ref >50)
LDL Cholesterol: 48 mg/dL (ref <100)
TRIGLYCERIDES: 106 mg/dL (ref <150)
VLDL: 21 mg/dL (ref <30)

## 2016-08-19 LAB — IRON: IRON: 55 ug/dL (ref 45–160)

## 2016-08-19 LAB — FERRITIN: FERRITIN: 485 ng/mL — AB (ref 20–288)

## 2016-08-20 LAB — VITAMIN D 25 HYDROXY (VIT D DEFICIENCY, FRACTURES): VIT D 25 HYDROXY: 60 ng/mL (ref 30–100)

## 2016-08-24 NOTE — Progress Notes (Signed)
Cardiology Office Note  Date: 08/25/2016   ID: Christine Dominguez, DOB 1951-04-01, MRN [id number]  PCP: [name], MD  Primary Cardiologist: [name], MD   Chief Complaint  Patient presents with  . Hypertension    History of Present Illness: Christine Dominguez is a 66 y.o. female last seen in September 2017. She presents for a follow-up visit. States that she has had fluctuations in blood pressure, but generally hypertensive. She tells me that she was not able to take Norvasc due to problems with leg swelling and rash. She took herself off of that medication, and has subsequently had adjustments in her therapy by Dr. Moshe Cipro.  Current blood pressure regimen includes Diovan, clonidine, and hydrochlorothiazide. She just had recent adjustments made and plans to see Dr. Moshe Cipro tomorrow. She does not report any chest pain or shortness of breath.  Past Medical History:  Diagnosis Date  . Anemia   . Arthritis    Cervical spine  . Cancer ([place]) 1998, recurred in 2012   Paraganglioma of jugular tympamicum, recurrent in 2012  . Chronic back pain 2007   Disabled   . Essential hypertension, benign 1980  . GERD (gastroesophageal reflux disease) 2000  . Hydrocephalus    Treated with VP shunt and then intracranial tumor resection (?Cholesteatoma); no longer followed actively by neurosurgery or otolaryngology  . Hyperlipidemia 2013  . Hypothyroidism 2013  . Neuropathy   . Obesity   . Type 2 diabetes mellitus ([place]) 1991    Current Outpatient Prescriptions  Medication Sig Dispense Refill  . ACCU-CHEK FASTCLIX LANCETS MISC TEST BLOOD SUGAR FOUR TIMES DAILY 408 each 1  . ACCU-CHEK SMARTVIEW test strip TEST 4 TIMES DAILY 400 each 1  . Alcohol Swabs (B-D SINGLE USE SWABS REGULAR) PADS TEST FOUR TIMES A DAY 400 each 2  . aspirin EC 81 MG tablet Take 81 mg by mouth at bedtime.    . B-D ULTRAFINE III SHORT PEN 31G X 8 MM MISC USE  TO INJECT EVERY DAY  WITH  LANTUS  90 each 2  . Blood Glucose Calibration (ACCU-CHEK SMARTVIEW CONTROL) LIQD USE AS DIRECTED 1 each 2  . blood glucose meter kit and supplies KIT Dispense based on patient and insurance preference. Use up to four times daily as directed. (FOR ICD-10 E11.65) 1 each 0  . Blood Glucose Monitoring Suppl (ACCU-CHEK NANO SMARTVIEW) w/Device KIT Test 4 x daily. E11.65 1 kit 0  . cloNIDine (CATAPRES) 0.1 MG tablet TAKE 1 TABLET TWICE DAILY 180 tablet 3  . docusate sodium (STOOL SOFTENER) 100 MG capsule Take 100 mg by mouth as needed for mild constipation.    . ferrous sulfate 325 (65 FE) MG EC tablet Take 325 mg by mouth daily.    Marland Kitchen gabapentin (NEURONTIN) 100 MG capsule TAKE 1 CAPSULE TWICE DAILY 180 capsule 1  . glucose (SUNMARK GLUCOSE) 4 GM chewable tablet Chew by mouth as needed.     . hydrochlorothiazide (HYDRODIURIL) 25 MG tablet Take 1 tablet (25 mg total) by mouth daily. 90 tablet 3  . Insulin Glargine (LANTUS SOLOSTAR) 100 UNIT/ML Solostar Pen Inject 50 Units into the skin at bedtime. 45 mL 0  . levothyroxine (SYNTHROID, LEVOTHROID) 88 MCG tablet TAKE 1 TABLET EVERY DAY BEFORE BREAKFAST 90 tablet 0  . linagliptin (TRADJENTA) 5 MG TABS tablet Take 1 tablet (5 mg total) by mouth daily. 90 tablet 0  . nystatin (MYCOSTATIN/NYSTOP) powder APPLY TOPICALLY TO RASH ON LOWER ABDOMEN AT BEDTIME AS NEEDED  120 g 1  . omeprazole (PRILOSEC) 40 MG capsule TAKE 1 CAPSULE (40 MG TOTAL) BY MOUTH DAILY. 90 capsule 1  . potassium chloride (K-DUR) 10 MEQ tablet Take 1 tablet (10 mEq total) by mouth 2 (two) times daily. 180 tablet 3  . Simethicone (GAS RELIEF DROPS PO) Take by mouth as needed.    . simvastatin (ZOCOR) 10 MG tablet TAKE 1 TABLET AT BEDTIME 90 tablet 1  . sodium chloride (OCEAN) 0.65 % nasal spray 1 spray as needed.     Marland Kitchen tiZANidine (ZANAFLEX) 2 MG tablet Take 1 tablet (2 mg total) by mouth daily as needed for muscle spasms. 30 tablet 2  . valsartan (DIOVAN) 320 MG tablet TAKE 1 TABLET EVERY DAY (DOSE  INCREASE) 90 tablet 1  . Vitamin D, Ergocalciferol, (DRISDOL) 50000 units CAPS capsule TAKE 1 CAPSULE EVERY 7 DAYS 12 capsule 0   No current facility-administered medications for this visit.    Allergies:  Amlodipine; Amaryl; Avandia [rosiglitazone maleate]; Glimepiride; Glipizide; Invokana [canagliflozin]; Lyrica [pregabalin]; Novolog [insulin aspart]; Rosiglitazone; Sitagliptin; and Aspirin   Social History: The patient  reports that she quit smoking about 15 years ago. Her smoking use included Cigarettes. She has a 7.50 pack-year smoking history. She has never used smokeless tobacco. She reports that she does not drink alcohol or use drugs.   ROS:  Please see the history of present illness. Otherwise, complete review of systems is positive for none.  All other systems are reviewed and negative.   Physical Exam: VS:  BP (!) 164/80   Pulse 82   Ht 5' 1.5" (1.562 m)   Wt 167 lb (75.8 kg)   SpO2 99%   BMI 31.04 kg/m , BMI Body mass index is 31.04 kg/m.  Wt Readings from Last 3 Encounters:  08/25/16 167 lb (75.8 kg)  07/29/16 167 lb (75.8 kg)  07/07/16 167 lb (75.8 kg)    Patient appears comfortable at rest.  HEENT: Conjunctiva normal, oropharynx clear with moist mucosa.  Neck: Supple, no carotid bruits, VP shunt on left side, no thyromegaly.  Lungs: Clear to auscultation, nonlabored breathing at rest.  Cardiac: Regular rate and rhythm, no S3 soft, systolic murmur, no pericardial rub.  Abdomen: Soft, nontender, bowel sounds present, no guarding or rebound.  Extremities: Trace leg edema, distal pulses 2+.   ECG: I personally reviewed the tracing from 12/15/2015 which showed normal sinus rhythm.  Recent Labwork: 06/16/2016: TSH 0.14 08/19/2016: ALT 11; AST 12; BUN 20; Creat 1.41; Hemoglobin 10.8; Platelets 238; Potassium 4.7; Sodium 139     Component Value Date/Time   CHOL 106 08/19/2016 0708   TRIG 106 08/19/2016 0708   HDL 37 (L) 08/19/2016 0708   CHOLHDL 2.9 08/19/2016  0708   VLDL 21 08/19/2016 0708   LDLCALC 48 08/19/2016 0708   Assessment and Plan:  1. Essential hypertension. Blood pressure elevated today but she has had recent medication adjustments made. Currently on Diovan, HCTZ at high dose, and clonidine. She did not tolerate Norvasc as outlined above. I encouraged her to keep follow-up with Dr. Moshe Cipro regularly for further medication titration. Alternatives to her present regimen would include a trial of chlorthalidone instead of HCTZ as this tends to have better blood pressure control, alternatively Aldactone might be a reasonable choice as long as renal function and potassium can tolerate.  2. Hyperlipidemia, she continues on statin therapy with recent LDL 48.  Current medicines were reviewed with the patient today.  Disposition: Follow-up in 6 months.  Signed,  Satira Sark, MD, [facility] 08/25/2016 10:40 AM    [facility] [address] [place], [place] [address] Phone: [phone]; Fax: [phone]

## 2016-08-25 ENCOUNTER — Ambulatory Visit (INDEPENDENT_AMBULATORY_CARE_PROVIDER_SITE_OTHER): Payer: Medicare HMO | Admitting: Cardiology

## 2016-08-25 ENCOUNTER — Encounter: Payer: Self-pay | Admitting: Cardiology

## 2016-08-25 VITALS — BP 164/80 | HR 82 | Ht 61.5 in | Wt 167.0 lb

## 2016-08-25 DIAGNOSIS — E782 Mixed hyperlipidemia: Secondary | ICD-10-CM | POA: Diagnosis not present

## 2016-08-25 DIAGNOSIS — I10 Essential (primary) hypertension: Secondary | ICD-10-CM

## 2016-08-25 NOTE — Patient Instructions (Signed)
Your physician wants you to follow-up in: 6 months Dr McDowell You will receive a reminder letter in the mail two months in advance. If you don't receive a letter, please call our office to schedule the follow-up appointment.  Your physician recommends that you continue on your current medications as directed. Please refer to the Current Medication list given to you today.    If you need a refill on your cardiac medications before your next appointment, please call your pharmacy.       Thank you for choosing [facility] !         

## 2016-08-26 ENCOUNTER — Ambulatory Visit (INDEPENDENT_AMBULATORY_CARE_PROVIDER_SITE_OTHER): Payer: Medicare HMO | Admitting: Family Medicine

## 2016-08-26 ENCOUNTER — Encounter: Payer: Self-pay | Admitting: Family Medicine

## 2016-08-26 VITALS — BP 160/80 | HR 84 | Resp 16 | Ht 61.0 in | Wt 167.1 lb

## 2016-08-26 DIAGNOSIS — E782 Mixed hyperlipidemia: Secondary | ICD-10-CM | POA: Diagnosis not present

## 2016-08-26 DIAGNOSIS — I10 Essential (primary) hypertension: Secondary | ICD-10-CM | POA: Diagnosis not present

## 2016-08-26 DIAGNOSIS — E1122 Type 2 diabetes mellitus with diabetic chronic kidney disease: Secondary | ICD-10-CM

## 2016-08-26 DIAGNOSIS — M5440 Lumbago with sciatica, unspecified side: Secondary | ICD-10-CM | POA: Diagnosis not present

## 2016-08-26 DIAGNOSIS — N183 Chronic kidney disease, stage 3 unspecified: Secondary | ICD-10-CM

## 2016-08-26 MED ORDER — CLONIDINE HCL 0.1 MG PO TABS: 0.1000 mg | ORAL_TABLET | Freq: Three times a day (TID) | ORAL | 3 refills | Status: DC

## 2016-08-26 NOTE — Patient Instructions (Signed)
F/u in 2 month, call if you need me sooner  Increase in dose of clonidine to  One tablet three tinmes daily, take at 6 am, 2 pm and 10 pm, since your blood pressure is still too high   Cholesterol and liver are excellnet  It is important that you exercise regularly at least 30 minutes 5 times a week. If you develop chest pain, have severe difficulty breathing, or feel very tired, stop exercising immediately and seek medical attention    Foot exam done today

## 2016-08-30 NOTE — Assessment & Plan Note (Signed)
Hyperlipidemia:Low fat diet discussed and encouraged.   Lipid Panel  Lab Results  Component Value Date   CHOL 106 08/19/2016   HDL 37 (L) 08/19/2016   LDLCALC 48 08/19/2016   TRIG 106 08/19/2016   CHOLHDL 2.9 08/19/2016    Controlled, no change in medication Encouraged pt to increase physical activity

## 2016-08-30 NOTE — Assessment & Plan Note (Signed)
Followed by endo, improved but still not at goal Christine Dominguez is reminded of the importance of commitment to daily physical activity for 30 minutes or more, as able and the need to limit carbohydrate intake to 30 to 60 grams per meal to help with blood sugar control.   The need to take medication as prescribed, test blood sugar as directed, and to call between visits if there is a concern that blood sugar is uncontrolled is also discussed.   Christine Dominguez is reminded of the importance of daily foot exam, annual eye examination, and good blood sugar, blood pressure and cholesterol control.  Diabetic Labs Latest Ref Rng & Units 08/19/2016 06/16/2016 04/01/2016 03/17/2016 01/06/2016  HbA1c <5.7 % - 8.9(H) - 8.2(H) -  Microalbumin Not estab mg/dL - - - 0.5 -  Micro/Creat Ratio <30 mcg/mg creat - - - 6 -  Chol <200 mg/dL 106 - - - 109(L)  HDL >50 mg/dL 37(L) - - - 36(L)  Calc LDL <100 mg/dL 48 - - - 51  Triglycerides <150 mg/dL 106 - - - 111  Creatinine 0.50 - 0.99 mg/dL 1.41(H) 1.41(H) 1.32(H) 1.52(H) 1.42(H)   BP/Weight 08/26/2016 08/25/2016 08/02/2016 07/29/2016 07/07/2016 0/0/5110 05/07/1171  Systolic BP 567 014 103 013 143 888 757  Diastolic BP 80 80 80 81 78 83 74  Wt. (Lbs) 167.12 167 - 167 167 168 169  BMI 31.58 31.04 - 31.55 31.55 31.74 31.93   Foot/eye exam completion dates Latest Ref Rng & Units 08/26/2016 11/25/2015  Eye Exam No Retinopathy - No Retinopathy  Foot exam Order - - -  Foot Form Completion - Done -

## 2016-08-30 NOTE — Progress Notes (Signed)
Christine Dominguez     MRN: [id number]      DOB: 29-May-1950   HPI Christine Dominguez is here for follow up saw cardiology states he is leaving me to handle elevated BP  C/o increased low back pain has a brace , no interest in epidural  and re-evaluation of chronic medical conditions, medication management and review of any available recent lab and radiology data.  Preventive health is updated, specifically  Cancer screening and Immunization.   Questions or concerns regarding consultations or procedures which the PT has had in the interim are  Addressed.Just  The PT denies any adverse reactions to current medications since the last visit.     ROS Denies recent fever or chills. Denies sinus pressure, nasal congestion, ear pain or sore throat. Denies chest congestion, productive cough or wheezing. Denies chest pains, palpitations and leg swelling Denies abdominal pain, nausea, vomiting,diarrhea or constipation.   Denies dysuria, frequency, hesitancy or incontinence. Denies depression, anxiety or insomnia. Denies skin break down or rash.   PE  BP (!) 160/80   Pulse 84   Resp 16   Ht 5\' 1"  (1.549 m)   Wt 167 lb 1.9 oz (75.8 kg)   SpO2 100%   BMI 31.58 kg/m   Patient alert and oriented and in no cardiopulmonary distress.  HEENT: No facial asymmetry, EOMI,   oropharynx pink and moist.  Neck supple no JVD, no mass.  Chest: Clear to auscultation bilaterally.  CVS: S1, S2 no murmurs, no S3.Regular rate.  ABD: Soft non tender.   Ext: No edema  MS: decreased ROM spine, adequate in shoulders, hips and knees.  Skin: Intact, no ulcerations or rash noted.  Psych: Good eye contact, normal affect. Memory intact not anxious or depressed appearing.  CNS: CN 2-12 intact, power,  normal throughout.no focal deficits noted.   Assessment & Plan  Essential hypertension, benign Uncontrolled increase dose of clonidine and review in  6 weeks DASH diet and commitment to daily  physical activity for a minimum of 30 minutes discussed and encouraged, as a part of hypertension management. The importance of attaining a healthy weight is also discussed.  BP/Weight 08/26/2016 08/25/2016 08/02/2016 07/29/2016 07/07/2016 11/03/173 1/0/2585  Systolic BP 277 824 235 361 443 154 008  Diastolic BP 80 80 80 81 78 83 74  Wt. (Lbs) 167.12 167 - 167 167 168 169  BMI 31.58 31.04 - 31.55 31.55 31.74 31.93       Diabetes mellitus with stage 3 chronic kidney disease (HCC) Followed by endo, improved but still not at goal Christine Dominguez is reminded of the importance of commitment to daily physical activity for 30 minutes or more, as able and the need to limit carbohydrate intake to 30 to 60 grams per meal to help with blood sugar control.   The need to take medication as prescribed, test blood sugar as directed, and to call between visits if there is a concern that blood sugar is uncontrolled is also discussed.   Christine Dominguez is reminded of the importance of daily foot exam, annual eye examination, and good blood sugar, blood pressure and cholesterol control.  Diabetic Labs Latest Ref Rng & Units 08/19/2016 06/16/2016 04/01/2016 03/17/2016 01/06/2016  HbA1c <5.7 % - 8.9(H) - 8.2(H) -  Microalbumin Not estab mg/dL - - - 0.5 -  Micro/Creat Ratio <30 mcg/mg creat - - - 6 -  Chol <200 mg/dL 106 - - - 109(L)  HDL >50 mg/dL 37(L) - - - 36(L)  Calc LDL <100 mg/dL 48 - - - 51  Triglycerides <150 mg/dL 106 - - - 111  Creatinine 0.50 - 0.99 mg/dL 1.41(H) 1.41(H) 1.32(H) 1.52(H) 1.42(H)   BP/Weight 08/26/2016 08/25/2016 08/02/2016 07/29/2016 07/07/2016 08/10/2618 06/07/5972  Systolic BP 163 845 364 680 321 224 825  Diastolic BP 80 80 80 81 78 83 74  Wt. (Lbs) 167.12 167 - 167 167 168 169  BMI 31.58 31.04 - 31.55 31.55 31.74 31.93   Foot/eye exam completion dates Latest Ref Rng & Units 08/26/2016 11/25/2015  Eye Exam No Retinopathy - No Retinopathy  Foot exam Order - - -  Foot Form Completion  - Done -        Mixed hyperlipidemia Hyperlipidemia:Low fat diet discussed and encouraged.   Lipid Panel  Lab Results  Component Value Date   CHOL 106 08/19/2016   HDL 37 (L) 08/19/2016   LDLCALC 48 08/19/2016   TRIG 106 08/19/2016   CHOLHDL 2.9 08/19/2016    Controlled, no change in medication Encouraged pt to increase physical activity   Back pain Has established disc disease and arthritis, no interest in epidural, Zanaflex as needed for spasm

## 2016-08-30 NOTE — Assessment & Plan Note (Signed)
Has established disc disease and arthritis, no interest in epidural, Zanaflex as needed for spasm

## 2016-08-30 NOTE — Assessment & Plan Note (Signed)
Uncontrolled increase dose of clonidine and review in  6 weeks DASH diet and commitment to daily physical activity for a minimum of 30 minutes discussed and encouraged, as a part of hypertension management. The importance of attaining a healthy weight is also discussed.  BP/Weight 08/26/2016 08/25/2016 08/02/2016 07/29/2016 07/07/2016 09/11/8646 07/11/2070  Systolic BP 182 883 374 451 460 479 987  Diastolic BP 80 80 80 81 78 83 74  Wt. (Lbs) 167.12 167 - 167 167 168 169  BMI 31.58 31.04 - 31.55 31.55 31.74 31.93

## 2016-08-31 ENCOUNTER — Other Ambulatory Visit: Payer: Self-pay | Admitting: Family Medicine

## 2016-09-01 ENCOUNTER — Other Ambulatory Visit: Payer: Self-pay | Admitting: "Endocrinology

## 2016-09-03 ENCOUNTER — Telehealth: Payer: Self-pay | Admitting: Family Medicine

## 2016-09-03 ENCOUNTER — Other Ambulatory Visit: Payer: Self-pay

## 2016-09-03 MED ORDER — CLONIDINE HCL 0.1 MG PO TABS: 0.1000 mg | ORAL_TABLET | Freq: Three times a day (TID) | ORAL | 3 refills | Status: DC

## 2016-09-03 NOTE — Telephone Encounter (Signed)
Corrected message, previously sent in wrong patients chart !     Patient is requesting her clonidine rx be sent to the [facility]   Cb#: [phone]

## 2016-09-03 NOTE — Telephone Encounter (Signed)
Med sent.

## 2016-09-06 ENCOUNTER — Other Ambulatory Visit: Payer: Self-pay

## 2016-09-06 MED ORDER — CLONIDINE HCL 0.1 MG PO TABS: 0.1000 mg | ORAL_TABLET | Freq: Three times a day (TID) | ORAL | 1 refills | Status: DC

## 2016-09-20 ENCOUNTER — Ambulatory Visit (HOSPITAL_COMMUNITY)
Admission: RE | Admit: 2016-09-20 | Discharge: 2016-09-20 | Disposition: A | Discharge status: Home / Self Care | Payer: Medicare HMO | Source: Ambulatory Visit | Attending: Family Medicine | Admitting: Family Medicine

## 2016-09-20 DIAGNOSIS — Z1231 Encounter for screening mammogram for malignant neoplasm of breast: Secondary | ICD-10-CM | POA: Insufficient documentation

## 2016-09-24 ENCOUNTER — Other Ambulatory Visit: Payer: Self-pay | Admitting: Family Medicine

## 2016-09-29 NOTE — Telephone Encounter (Signed)
Last vit d level on 5 17 18  was 60  Do you want to renew?

## 2016-10-04 ENCOUNTER — Telehealth: Payer: Self-pay | Admitting: *Deleted

## 2016-10-04 ENCOUNTER — Other Ambulatory Visit: Payer: Self-pay | Admitting: Family Medicine

## 2016-10-04 NOTE — Telephone Encounter (Signed)
Called and spoke to Shonia, states she has a small bump on her buttocks, not open, she has been warm soaking it and applying bacitracin . Advised to call if worsens and needs to be seen.

## 2016-10-04 NOTE — Telephone Encounter (Signed)
Patient called stating she has a small sore on her buttocks and it hasn't came to a head or anything. Patient states she has used cream for it, patient is requesting what else she can use. Please advise [phone]

## 2016-10-07 ENCOUNTER — Telehealth: Payer: Self-pay | Admitting: *Deleted

## 2016-10-07 MED ORDER — VITAMIN D (ERGOCALCIFEROL) 1.25 MG (50000 UNIT) PO CAPS: ORAL_CAPSULE | ORAL | 0 refills | Status: DC

## 2016-10-07 NOTE — Telephone Encounter (Signed)
Patient called requesting vitamin D refilled. Please advise

## 2016-10-08 ENCOUNTER — Other Ambulatory Visit: Payer: Self-pay | Admitting: "Endocrinology

## 2016-10-13 ENCOUNTER — Telehealth: Payer: Self-pay | Admitting: Family Medicine

## 2016-10-13 ENCOUNTER — Other Ambulatory Visit: Payer: Self-pay | Admitting: "Endocrinology

## 2016-10-13 DIAGNOSIS — N183 Chronic kidney disease, stage 3 unspecified: Secondary | ICD-10-CM

## 2016-10-13 DIAGNOSIS — E1122 Type 2 diabetes mellitus with diabetic chronic kidney disease: Secondary | ICD-10-CM

## 2016-10-13 LAB — COMPREHENSIVE METABOLIC PANEL
ALK PHOS: 73 U/L (ref 33–130)
ALT: 12 U/L (ref 6–29)
AST: 12 U/L (ref 10–35)
Albumin: 4.1 g/dL (ref 3.6–5.1)
BUN: 22 mg/dL (ref 7–25)
CHLORIDE: 104 mmol/L (ref 98–110)
CO2: 24 mmol/L (ref 20–31)
Calcium: 9.6 mg/dL (ref 8.6–10.4)
Creat: 1.44 mg/dL — ABNORMAL HIGH (ref 0.50–0.99)
GLUCOSE: 180 mg/dL — AB (ref 65–99)
Potassium: 4.6 mmol/L (ref 3.5–5.3)
Sodium: 138 mmol/L (ref 135–146)
TOTAL PROTEIN: 7.5 g/dL (ref 6.1–8.1)
Total Bilirubin: 0.3 mg/dL (ref 0.2–1.2)

## 2016-10-13 NOTE — Telephone Encounter (Signed)
[facility] called regarding patient. States she was previously seeing provider at [facility], but is now wanting to see podiatrist Dr. Francee Piccolo with [facility]. Reason for change is that Dr Melony Overly will travel to patient for foot care instead of patient traveling to doctor.   Dr. Francee Piccolo [facility] [address] [place], [place] [address] Ph: [phone] Fax: [phone]  Referral for foot care. Is wanting patient to been seen next week.

## 2016-10-14 LAB — HEMOGLOBIN A1C
HEMOGLOBIN A1C: 8.9 % — AB (ref <5.7)
Mean Plasma Glucose: 209 mg/dL

## 2016-10-14 NOTE — Telephone Encounter (Signed)
Referral entered  

## 2016-10-18 ENCOUNTER — Telehealth: Payer: Self-pay | Admitting: Family Medicine

## 2016-10-18 MED ORDER — VITAMIN D (ERGOCALCIFEROL) 1.25 MG (50000 UNIT) PO CAPS: ORAL_CAPSULE | ORAL | 0 refills | Status: DC

## 2016-10-18 NOTE — Telephone Encounter (Signed)
Vit D sent, please advise on other.

## 2016-10-18 NOTE — Telephone Encounter (Signed)
Vitamin D, Ergocalciferol, (DRISDOL) 50000 units CAPS capsule please call [facility]   Also patient has a boil on rectum and was using neosporin (which almost cleared up) but now is irritated again.  Is there anything else she can try or that you will call in.  Please call in at [facility]

## 2016-10-19 NOTE — Telephone Encounter (Signed)
Since neosporin is working I recommend she continue this

## 2016-10-19 NOTE — Telephone Encounter (Signed)
Called patient regarding message below. No answer, left generic message for patient to return call.   

## 2016-10-19 NOTE — Telephone Encounter (Signed)
Patient informed of message below, verbalized understanding.  

## 2016-10-20 ENCOUNTER — Other Ambulatory Visit: Payer: Self-pay | Admitting: Family Medicine

## 2016-10-21 ENCOUNTER — Encounter: Payer: Self-pay | Admitting: "Endocrinology

## 2016-10-21 ENCOUNTER — Ambulatory Visit (INDEPENDENT_AMBULATORY_CARE_PROVIDER_SITE_OTHER): Payer: Medicare HMO | Admitting: "Endocrinology

## 2016-10-21 VITALS — BP 177/89 | HR 84 | Ht 61.0 in | Wt 168.0 lb

## 2016-10-21 DIAGNOSIS — E039 Hypothyroidism, unspecified: Secondary | ICD-10-CM

## 2016-10-21 DIAGNOSIS — E1122 Type 2 diabetes mellitus with diabetic chronic kidney disease: Secondary | ICD-10-CM | POA: Diagnosis not present

## 2016-10-21 DIAGNOSIS — I10 Essential (primary) hypertension: Secondary | ICD-10-CM | POA: Diagnosis not present

## 2016-10-21 DIAGNOSIS — N183 Chronic kidney disease, stage 3 unspecified: Secondary | ICD-10-CM

## 2016-10-21 DIAGNOSIS — E782 Mixed hyperlipidemia: Secondary | ICD-10-CM

## 2016-10-21 MED ORDER — INSULIN GLARGINE 100 UNIT/ML SOLOSTAR PEN: 60.0000 [IU] | PEN_INJECTOR | Freq: Every day | SUBCUTANEOUS | 0 refills | Status: DC

## 2016-10-21 NOTE — Progress Notes (Signed)
Subjective:    Patient ID: Christine Dominguez, female    DOB: March 08, 1951, PCP [name], MD   Past Medical History:  Diagnosis Date  . Anemia   . Arthritis    Cervical spine  . 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 Chronic back pain 2007   Disabled   . Essential hypertension, benign 1980  . GERD (gastroesophageal reflux disease) 2000  . Hydrocephalus    Treated with VP shunt and then intracranial tumor resection (?Cholesteatoma); no longer followed actively by neurosurgery or otolaryngology  . Hyperlipidemia 2013  . Hypothyroidism 2013  . Neuropathy   . Obesity   . Type 2 diabetes mellitus ([place]) 1991   Past Surgical History:  Procedure Laterality Date  . CATARACT EXTRACTION W/PHACO Right 07/17/2013   Procedure: CATARACT EXTRACTION PHACO AND INTRAOCULAR LENS PLACEMENT (IOC);  Surgeon: [name], MD;  Location: [facility];  Service: Ophthalmology;  Laterality: Right;  CDE:7.24  . COLONOSCOPY  06/2010  . Hemorrhoidectomy    . Intracranial mass resected  1998   At [facility]; ? location-right middle ear  . Teeth pulled    . TRIGGER FINGER RELEASE     Rght thumb  . TUBAL LIGATION  1980  . TUBAL LIGATION    . VENTRICULOPERITONEAL SHUNT  1998   At [facility] History   Social History  . Marital status: Widowed    Spouse name: N/A  . Number of children: 3  . Years of education: N/A   Occupational History  . disabled     Social History Main Topics  . Smoking status: Former Smoker    Packs/day: 0.50    Years: 15.00    Types: Cigarettes    Quit date: 07/07/2001  . Smokeless tobacco: Never Used  . Alcohol use No  . Drug use: No  . Sexual activity: No     Comment: tubal   Other Topics Concern  . None   Social History Narrative   Mother of 3 - all children are deceased         Outpatient Encounter Prescriptions as of 10/21/2016  Medication Sig  . Alcohol Swabs (B-D SINGLE USE SWABS  REGULAR) PADS TEST FOUR TIMES A DAY  . aspirin EC 81 MG tablet Take 81 mg by mouth at bedtime.  . B-D ULTRAFINE III SHORT PEN 31G X 8 MM MISC USE  TO INJECT EVERY DAY  WITH  LANTUS  . cloNIDine (CATAPRES) 0.1 MG tablet Take 1 tablet (0.1 mg total) by mouth 3 (three) times daily.  Marland Kitchen docusate sodium (STOOL SOFTENER) 100 MG capsule Take 100 mg by mouth as needed for mild constipation.  . ferrous sulfate 325 (65 FE) MG EC tablet Take 325 mg by mouth daily.  Marland Kitchen gabapentin (NEURONTIN) 100 MG capsule TAKE 1 CAPSULE TWICE DAILY  . glucose (SUNMARK GLUCOSE) 4 GM chewable tablet Chew by mouth as needed.   . hydrochlorothiazide (HYDRODIURIL) 25 MG tablet Take 1 tablet (25 mg total) by mouth daily.  . Insulin Glargine (LANTUS SOLOSTAR) 100 UNIT/ML Solostar Pen Inject 60 Units into the skin at bedtime.  Marland Kitchen levothyroxine (SYNTHROID, LEVOTHROID) 88 MCG tablet TAKE 1 TABLET EVERY DAY BEFORE BREAKFAST  . linagliptin (TRADJENTA) 5 MG TABS tablet Take 1 tablet (5 mg total) by mouth daily.  Marland Kitchen nystatin (MYCOSTATIN/NYSTOP) powder APPLY TOPICALLY TO RASH ON LOWER ABDOMEN AT BEDTIME AS NEEDED  . omeprazole (PRILOSEC) 40 MG  capsule TAKE 1 CAPSULE (40 MG TOTAL) BY MOUTH DAILY.  Marland Kitchen potassium chloride (K-DUR) 10 MEQ tablet Take 1 tablet (10 mEq total) by mouth 2 (two) times daily.  . Simethicone (GAS RELIEF DROPS PO) Take by mouth as needed.  . simvastatin (ZOCOR) 10 MG tablet TAKE 1 TABLET AT BEDTIME  . sodium chloride (OCEAN) 0.65 % nasal spray 1 spray as needed.   Marland Kitchen tiZANidine (ZANAFLEX) 2 MG tablet Take 1 tablet (2 mg total) by mouth daily as needed for muscle spasms.  . valsartan (DIOVAN) 320 MG tablet TAKE 1 TABLET EVERY DAY  . Vitamin D, Ergocalciferol, (DRISDOL) 50000 units CAPS capsule TAKE 1 CAPSULE EVERY 7 DAYS  . [DISCONTINUED] ACCU-CHEK FASTCLIX LANCETS MISC TEST BLOOD SUGAR FOUR TIMES DAILY  . [DISCONTINUED] ACCU-CHEK SMARTVIEW test strip TEST 4 TIMES DAILY  . [DISCONTINUED] Blood Glucose Calibration  (ACCU-CHEK SMARTVIEW CONTROL) LIQD USE AS DIRECTED  . [DISCONTINUED] blood glucose meter kit and supplies KIT Dispense based on patient and insurance preference. Use up to four times daily as directed. (FOR ICD-10 E11.65)  . [DISCONTINUED] Blood Glucose Monitoring Suppl (ACCU-CHEK NANO SMARTVIEW) w/Device KIT Test 4 x daily. E11.65  . [DISCONTINUED] LANTUS SOLOSTAR 100 UNIT/ML Solostar Pen INJECT 50 UNITS INTO THE SKIN AT BEDTIME.   No facility-administered encounter medications on file as of 10/21/2016.    ALLERGIES: Allergies  Allergen Reactions  . Amlodipine Swelling    Itching and leg swelling  . Amaryl   . Avandia [Rosiglitazone Maleate]   . Glimepiride Itching  . Glipizide Itching  . Invokana [Canagliflozin]   . Lyrica [Pregabalin] Nausea And Vomiting  . Novolog [Insulin Aspart]   . Rosiglitazone     Other reaction(s): OTHER  . Sitagliptin     Other reaction(s): OTHER  . Aspirin Nausea Only and Other (See Comments)    Other Reaction: GI Upset; CAN TAKE 81 mg More of a burning feeling when she take it Burning in stomach, nausea   VACCINATION STATUS: Immunization History  Administered Date(s) Administered  . Influenza Split 02/15/2012  . Influenza Whole 01/06/2010  . Influenza,inj,Quad PF,36+ Mos 05/14/2013, 01/17/2014, 04/03/2015, 12/17/2015  . Pneumococcal Conjugate-13 08/08/2014  . Pneumococcal Polysaccharide-23 10/13/2009, 08/12/2015  . Tdap 08/24/2005  . Zoster 10/23/2012    Diabetes  She presents for her follow-up diabetic visit. She has type 2 diabetes mellitus. Her disease course has been stable. There are no hypoglycemic associated symptoms. Pertinent negatives for hypoglycemia include no confusion, headaches, pallor or seizures. Associated symptoms include fatigue, polydipsia and polyuria. Pertinent negatives for diabetes include no chest pain and no polyphagia. There are no hypoglycemic complications. Symptoms are stable. Diabetic complications include  nephropathy. Risk factors for coronary artery disease include dyslipidemia, diabetes mellitus and sedentary lifestyle. Current diabetic treatment includes insulin injections. She is compliant with treatment some of the time. Her weight is stable. She is following a generally unhealthy diet. When asked about meal planning, she reported none. She has had a previous visit with a dietitian. She rarely participates in exercise. Her home blood glucose trend is increasing steadily. Her breakfast blood glucose range is generally 140-180 mg/dl. Her bedtime blood glucose range is generally 180-200 mg/dl. Her overall blood glucose range is 180-200 mg/dl. (She denies hypoglycemia, but she worries about it . ) An ACE inhibitor/angiotensin II receptor blocker is being taken.  Hyperlipidemia  This is a chronic problem. The current episode started more than 1 year ago. Exacerbating diseases include diabetes, hypothyroidism and obesity. Pertinent negatives include no chest pain,  myalgias or shortness of breath. Current antihyperlipidemic treatment includes statins.  Hypertension  This is a chronic problem. The current episode started more than 1 year ago. Pertinent negatives include no chest pain, headaches, palpitations or shortness of breath. Risk factors for coronary artery disease include diabetes mellitus, dyslipidemia, obesity and sedentary lifestyle.      Review of Systems  Constitutional: Positive for fatigue. Negative for chills, fever and unexpected weight change.  HENT: Negative for trouble swallowing and voice change.   Eyes: Negative for visual disturbance.  Respiratory: Negative for cough, shortness of breath and wheezing.   Cardiovascular: Negative for chest pain, palpitations and leg swelling.  Gastrointestinal: Negative for diarrhea, nausea and vomiting.  Endocrine: Positive for polydipsia and polyuria. Negative for cold intolerance, heat intolerance and polyphagia.  Musculoskeletal: Negative for  arthralgias and myalgias.  Skin: Negative for color change, pallor, rash and wound.  Neurological: Negative for seizures and headaches.  Psychiatric/Behavioral: Negative for confusion and suicidal ideas.    Objective:    BP (!) 177/89   Pulse 84   Ht _0  (1.549 m)   Wt 168 lb (76.2 kg)   BMI 31.74 kg/m   Wt Readings from Last 3 Encounters:  10/21/16 168 lb (76.2 kg)  08/26/16 167 lb 1.9 oz (75.8 kg)  08/25/16 167 lb (75.8 kg)    Physical Exam  Constitutional: She is oriented to person, place, and time. She appears well-developed.  HENT:  Head: Normocephalic and atraumatic.  Eyes: EOM are normal.  Neck: Normal range of motion. Neck supple. No tracheal deviation present. No thyromegaly present.  Cardiovascular: Normal rate and regular rhythm.   Pulmonary/Chest: Effort normal and breath sounds normal.  Abdominal: Soft. Bowel sounds are normal. There is no tenderness. There is no guarding.  Musculoskeletal: Normal range of motion. She exhibits no edema.  Neurological: She is alert and oriented to person, place, and time. She has normal reflexes. No cranial nerve deficit. Coordination normal.  Skin: Skin is warm and dry. No rash noted. No erythema. No pallor.  Psychiatric: She has a normal mood and affect. Judgment normal.    Results for orders placed or performed in visit on 10/13/16  Comprehensive metabolic panel  Result Value Ref Range   Sodium 138 135 - 146 mmol/L   Potassium 4.6 3.5 - 5.3 mmol/L   Chloride 104 98 - 110 mmol/L   CO2 24 20 - 31 mmol/L   Glucose, Bld 180 (H) 65 - 99 mg/dL   BUN 22 7 - 25 mg/dL   Creat 1.44 (H) 0.50 - 0.99 mg/dL   Total Bilirubin 0.3 0.2 - 1.2 mg/dL   Alkaline Phosphatase 73 33 - 130 U/L   AST 12 10 - 35 U/L   ALT 12 6 - 29 U/L   Total Protein 7.5 6.1 - 8.1 g/dL   Albumin 4.1 3.6 - 5.1 g/dL   Calcium 9.6 8.6 - 10.4 mg/dL  Hemoglobin A1c  Result Value Ref Range   Hgb A1c MFr Bld 8.9 (H) <5.7 %   Mean Plasma Glucose 209 mg/dL    Diabetic Labs (most recent): Lab Results  Component Value Date   HGBA1C 8.9 (H) 10/13/2016   HGBA1C 8.9 (H) 06/16/2016   HGBA1C 8.2 (H) 03/17/2016   Lipid Panel     Component Value Date/Time   CHOL 106 08/19/2016 0708   TRIG 106 08/19/2016 0708   HDL 37 (L) 08/19/2016 0708   CHOLHDL 2.9 08/19/2016 0708   VLDL 21 08/19/2016 0708  [place] 48 08/19/2016 0708     Assessment & Plan:   1. Diabetes mellitus with stage 3 chronic kidney disease ([place]) -She remains at a high risk for more acute and chronic complications of diabetes which include CAD, CVA, CKD, retinopathy, and neuropathy. These are all discussed in detail with the patient.  Patient came with  above target glucose profile, and  recent A1c of  8.9% unchanged from prior study. Glucose logs and insulin administration records pertaining to this visit,  to be scanned into patient's records.  Recent labs reviewed.   - I have re-counseled the patient on diet management andweight loss  by adopting a carbohydrate restricted / protein rich  Diet.  - Suggestion is made for patient to avoid simple carbohydrates   from her diet including Cakes , Desserts, Ice Cream,  Soda (  diet and regular) , Sweet Tea , Candies,  Chips, Cookies, Artificial Sweeteners,   and "Sugar-free" Products .  This will help patient to have stable blood glucose profile and potentially avoid unintended  Weight gain.  - Patient is advised to stick to a routine mealtimes to eat 3 meals  a day and avoid unnecessary snacks ( to snack only to correct hypoglycemia).   - I have approached patient with the following individualized plan to manage diabetes and patient agrees.  She does not want to tighten control ,  due to fear of hypoglycemia.  - She reluctantly accepts to increase her Lantus to 60 units  every morning with breakfast associated with monitoring her blood glucose twice a day, before breakfast and at bedtime. -Continue Tradjenta 5 mg by mouth every  morning.   - She did not like Invokana .  - Patient specific target  for A1c; LDL, HDL, Triglycerides, and  Waist Circumference were discussed in detail.  2) BP/HTN: uncontrolled. Her blood pressure medications were recently changed due to intolerance to amlodipine. Her hydrochlorothiazide was increased to 25 mg by mouth daily this last week.  I have advised her to continue clonidine 0.1 mg by mouth twice a day as well.   3) Lipids/HPL:  continue statins. 4)  Weight/Diet: CDE consult in progress, exercise, and carbohydrates information provided.  5) hypothyroidism:  her last thyroid function tests are consistent with appropriate replacement. I will  continue Levothyroxine 88 g by mouth every morning.   - We discussed about correct intake of levothyroxine, at fasting, with water, separated by at least 30 minutes from breakfast, and separated by more than 4 hours from calcium, iron, multivitamins, acid reflux medications (PPIs). -Patient is made aware of the fact that thyroid hormone replacement is needed for life, dose to be adjusted by periodic monitoring of thyroid function tests.  6) Chronic Care/Health Maintenance:  -Patient is on ACEI/ARB and Statin medications and encouraged to continue to follow up with Ophthalmology, Podiatrist at least yearly or according to recommendations, and advised to  stay away from smoking. I have recommended yearly flu vaccine and pneumonia vaccination at least every 5 years; moderate intensity exercise for up to 150 minutes weekly; and  sleep for at least 7 hours a day.  - 25 minutes of time was spent on the care of this patient , 50% of which was applied for counseling on diabetes complications and their preventions.  - I advised patient to maintain close follow up with [name], MD for primary care needs.  Patient is asked to bring meter and  blood glucose logs during her next visit.  Follow up plan: -Return in about 3 months (around  01/21/2017) for follow up with pre-visit labs, meter, and logs.  Glade Lloyd, MD Phone: [phone]  Fax: [phone]   10/21/2016, 8:42 AM

## 2016-10-21 NOTE — Patient Instructions (Signed)

## 2016-10-25 DIAGNOSIS — M2041 Other hammer toe(s) (acquired), right foot: Secondary | ICD-10-CM | POA: Diagnosis not present

## 2016-10-25 DIAGNOSIS — L84 Corns and callosities: Secondary | ICD-10-CM | POA: Diagnosis not present

## 2016-10-25 DIAGNOSIS — B351 Tinea unguium: Secondary | ICD-10-CM | POA: Diagnosis not present

## 2016-10-25 DIAGNOSIS — E119 Type 2 diabetes mellitus without complications: Secondary | ICD-10-CM | POA: Diagnosis not present

## 2016-10-27 ENCOUNTER — Encounter: Payer: Self-pay | Admitting: Family Medicine

## 2016-10-27 ENCOUNTER — Ambulatory Visit (INDEPENDENT_AMBULATORY_CARE_PROVIDER_SITE_OTHER): Payer: Medicare HMO | Admitting: Family Medicine

## 2016-10-27 VITALS — BP 170/80 | HR 80 | Temp 97.4°F | Resp 16 | Ht 61.0 in | Wt 169.8 lb

## 2016-10-27 DIAGNOSIS — E782 Mixed hyperlipidemia: Secondary | ICD-10-CM

## 2016-10-27 DIAGNOSIS — I10 Essential (primary) hypertension: Secondary | ICD-10-CM | POA: Diagnosis not present

## 2016-10-27 DIAGNOSIS — M5440 Lumbago with sciatica, unspecified side: Secondary | ICD-10-CM

## 2016-10-27 DIAGNOSIS — Z780 Asymptomatic menopausal state: Secondary | ICD-10-CM

## 2016-10-27 DIAGNOSIS — N183 Chronic kidney disease, stage 3 (moderate): Secondary | ICD-10-CM | POA: Diagnosis not present

## 2016-10-27 DIAGNOSIS — E1122 Type 2 diabetes mellitus with diabetic chronic kidney disease: Secondary | ICD-10-CM

## 2016-10-27 MED ORDER — CLONIDINE HCL 0.2 MG PO TABS: 0.2000 mg | ORAL_TABLET | Freq: Three times a day (TID) | ORAL | 1 refills | Status: DC

## 2016-10-27 MED ORDER — OLMESARTAN MEDOXOMIL 40 MG PO TABS: 40.0000 mg | ORAL_TABLET | Freq: Every day | ORAL | 3 refills | Status: DC

## 2016-10-27 NOTE — Patient Instructions (Signed)
F/u in 5 weeks, call if you need me sooner.  Please increase  Clonidine to 0.2 mg one tablet three times daily, OK to take TWO 0.1 mg tablet three times daily till done  Valsartan is changed to benicar once you get the new medication is in due to recent manufacturer issue  Boil is healed totally

## 2016-10-27 NOTE — Assessment & Plan Note (Signed)
Uncontrolled, increase dose of clonidine and f/u in 5 to 8 weaks. Change diovamn to benicar due to drug recall DASH diet and commitment to daily physical activity for a minimum of 30 minutes discussed and encouraged, as a part of hypertension management. The importance of attaining a healthy weight is also discussed.  BP/Weight 10/27/2016 10/21/2016 08/26/2016 08/25/2016 08/02/2016 07/16/8206 04/07/8869  Systolic BP 959 747 185 501 586 825 749  Diastolic BP 80 89 80 80 80 81 78  Wt. (Lbs) 169.75 168 167.12 167 - 167 167  BMI 32.07 31.74 31.58 31.04 - 31.55 31.55

## 2016-10-27 NOTE — Assessment & Plan Note (Signed)
Uncontrolled, managed by endo Christine Dominguez is reminded of the importance of commitment to daily physical activity for 30 minutes or more, as able and the need to limit carbohydrate intake to 30 to 60 grams per meal to help with blood sugar control.   The need to take medication as prescribed, test blood sugar as directed, and to call between visits if there is a concern that blood sugar is uncontrolled is also discussed.   Christine Dominguez is reminded of the importance of daily foot exam, annual eye examination, and good blood sugar, blood pressure and cholesterol control.  Diabetic Labs Latest Ref Rng & Units 10/13/2016 08/19/2016 06/16/2016 04/01/2016 03/17/2016  HbA1c <5.7 % 8.9(H) - 8.9(H) - 8.2(H)  Microalbumin Not estab mg/dL - - - - 0.5  Micro/Creat Ratio <30 mcg/mg creat - - - - 6  Chol <200 mg/dL - 106 - - -  HDL >50 mg/dL - 37(L) - - -  Calc LDL <100 mg/dL - 48 - - -  Triglycerides <150 mg/dL - 106 - - -  Creatinine 0.50 - 0.99 mg/dL 1.44(H) 1.41(H) 1.41(H) 1.32(H) 1.52(H)   BP/Weight 10/27/2016 10/21/2016 08/26/2016 08/25/2016 08/02/2016 3/50/0938 04/13/2991  Systolic BP 716 967 893 810 175 102 585  Diastolic BP 80 89 80 80 80 81 78  Wt. (Lbs) 169.75 168 167.12 167 - 167 167  BMI 32.07 31.74 31.58 31.04 - 31.55 31.55   Foot/eye exam completion dates Latest Ref Rng & Units 08/26/2016 11/25/2015  Eye Exam No Retinopathy - No Retinopathy  Foot exam Order - - -  Foot Form Completion - Done -

## 2016-10-27 NOTE — Progress Notes (Signed)
Christine Dominguez     MRN: [id number]      DOB: [birth year]   HPI Christine Dominguez is here for follow up and re-evaluation of chronic medical conditions, medication management of uncontrolled hypertension.  Questions or concerns regarding consultations or procedures which the PT has had in the interim are  Addressed.Recently saw endo, her blood sugar remains uncontrolled and she is continuing to work on this The PT denies any adverse reactions to current medications since the last visit.  Boil on buttock 2 weeks ago which has drained with topical treatment wants this checked  ROS Denies recent fever or chills. Denies sinus pressure, nasal congestion, ear pain or sore throat. Denies chest congestion, productive cough or wheezing. Denies chest pains, palpitations and leg swelling Denies abdominal pain, nausea, vomiting,diarrhea or constipation.   Denies dysuria, frequency, hesitancy or incontinence. C/o chronic and increased back PAIN AND limitation in mobility.NO INTEREST IN EPIDURAL AS THIS RAISES ALREADY UNCONTROLLED BLOOD SUGAR Denies headaches, seizures, numbness, or tingling. Denies depression, anxiety or insomnia.  PE  Pulse 80   Temp (!) 97.4 F (36.3 C) (Other (Comment))   Resp 16   Ht 5\' 1"  (1.549 m)   Wt 169 lb 12 oz (77 kg)   SpO2 99%   BMI 32.07 kg/m   Patient alert and oriented and in no cardiopulmonary distress.  HEENT: No facial asymmetry, EOMI,   oropharynx pink and moist.  Neck supple no JVD, no mass.  Chest: Clear to auscultation bilaterally.  CVS: S1, S2 no murmurs, no S3.Regular rate.  ABD: Soft non tender.   Ext: No edema  MS: Decreased ROM spine, adequate in shoulders, hips and knees.  Skin: Intact, no ulcerations or rash noted.Buttock examined no infection noted, specifically no boil, redness, tenderness or drainage  Psych: Good eye contact, normal affect. Memory intact not anxious or depressed appearing.  CNS: CN 2-12 intact, power,   normal throughout.no focal deficits noted.   Assessment & Plan  Essential hypertension, benign Uncontrolled, increase dose of clonidine and f/u in 5 to 8 weaks. Change diovamn to benicar due to drug recall DASH diet and commitment to daily physical activity for a minimum of 30 minutes discussed and encouraged, as a part of hypertension management. The importance of attaining a healthy weight is also discussed.  BP/Weight 10/27/2016 10/21/2016 08/26/2016 08/25/2016 08/02/2016 5/95/6387 08/09/4330  Systolic BP 951 884 166 063 016 010 932  Diastolic BP 80 89 80 80 80 81 78  Wt. (Lbs) 169.75 168 167.12 167 - 167 167  BMI 32.07 31.74 31.58 31.04 - 31.55 31.55       Diabetes mellitus with stage 3 chronic kidney disease (HCC) Uncontrolled, managed by endo Christine Dominguez is reminded of the importance of commitment to daily physical activity for 30 minutes or more, as able and the need to limit carbohydrate intake to 30 to 60 grams per meal to help with blood sugar control.   The need to take medication as prescribed, test blood sugar as directed, and to call between visits if there is a concern that blood sugar is uncontrolled is also discussed.   Christine Dominguez is reminded of the importance of daily foot exam, annual eye examination, and good blood sugar, blood pressure and cholesterol control.  Diabetic Labs Latest Ref Rng & Units 10/13/2016 08/19/2016 06/16/2016 04/01/2016 03/17/2016  HbA1c <5.7 % 8.9(H) - 8.9(H) - 8.2(H)  Microalbumin Not estab mg/dL - - - - 0.5  Micro/Creat Ratio <30 mcg/mg creat - - - -  6  Chol <200 mg/dL - 106 - - -  HDL >50 mg/dL - 37(L) - - -  Calc LDL <100 mg/dL - 48 - - -  Triglycerides <150 mg/dL - 106 - - -  Creatinine 0.50 - 0.99 mg/dL 1.44(H) 1.41(H) 1.41(H) 1.32(H) 1.52(H)   BP/Weight 10/27/2016 10/21/2016 08/26/2016 08/25/2016 08/02/2016 2/99/2426 11/06/4194  Systolic BP 222 979 892 119 417 408 144  Diastolic BP 80 89 80 80 80 81 78  Wt. (Lbs) 169.75 168  167.12 167 - 167 167  BMI 32.07 31.74 31.58 31.04 - 31.55 31.55   Foot/eye exam completion dates Latest Ref Rng & Units 08/26/2016 11/25/2015  Eye Exam No Retinopathy - No Retinopathy  Foot exam Order - - -  Foot Form Completion - Done -        Mixed hyperlipidemia Hyperlipidemia:Low fat diet discussed and encouraged.   Lipid Panel  Lab Results  Component Value Date   CHOL 106 08/19/2016   HDL 37 (L) 08/19/2016   LDLCALC 48 08/19/2016   TRIG 106 08/19/2016   CHOLHDL 2.9 08/19/2016   Needs tro increease exercise, otherwise controlled on current meds   Back pain worsening with established disc disease, physical therapy,  And weight loss are her best options

## 2016-10-27 NOTE — Assessment & Plan Note (Signed)
Hyperlipidemia:Low fat diet discussed and encouraged.   Lipid Panel  Lab Results  Component Value Date   CHOL 106 08/19/2016   HDL 37 (L) 08/19/2016   LDLCALC 48 08/19/2016   TRIG 106 08/19/2016   CHOLHDL 2.9 08/19/2016   Needs tro increease exercise, otherwise controlled on current meds

## 2016-10-27 NOTE — Assessment & Plan Note (Signed)
worsening with established disc disease, physical therapy,  And weight loss are her best options

## 2016-11-01 ENCOUNTER — Other Ambulatory Visit: Payer: Self-pay | Admitting: "Endocrinology

## 2016-11-02 ENCOUNTER — Ambulatory Visit: Payer: Medicare HMO | Admitting: Podiatry

## 2016-11-08 ENCOUNTER — Other Ambulatory Visit: Payer: Self-pay | Admitting: "Endocrinology

## 2016-11-08 ENCOUNTER — Other Ambulatory Visit: Payer: Self-pay | Admitting: Family Medicine

## 2016-11-11 ENCOUNTER — Telehealth: Payer: Self-pay | Admitting: *Deleted

## 2016-11-11 NOTE — Telephone Encounter (Signed)
Patient called regarding her Bone Density test, patient would like to get this scheduled. Please advise

## 2016-11-11 NOTE — Telephone Encounter (Signed)
I advised pt to take as written

## 2016-11-11 NOTE — Telephone Encounter (Signed)
Scheduled for 8/16 aph arrive at 9:45am called and left message

## 2016-11-11 NOTE — Telephone Encounter (Signed)
I recommend one and a half twice daily

## 2016-11-11 NOTE — Telephone Encounter (Signed)
Stating that during the day buy the time she takes her 2nd clonidine that she gets so sleepy and is not able to go anywhere and its interfering with her daytime plans. Is there anyway she can take them all at night or does she have to split them up tid

## 2016-11-16 ENCOUNTER — Other Ambulatory Visit: Payer: Self-pay | Admitting: "Endocrinology

## 2016-11-17 ENCOUNTER — Other Ambulatory Visit: Payer: Self-pay | Admitting: Family Medicine

## 2016-11-17 ENCOUNTER — Telehealth: Payer: Self-pay | Admitting: *Deleted

## 2016-11-17 ENCOUNTER — Other Ambulatory Visit: Payer: Self-pay

## 2016-11-17 ENCOUNTER — Other Ambulatory Visit: Payer: Self-pay | Admitting: "Endocrinology

## 2016-11-17 MED ORDER — GABAPENTIN 100 MG PO CAPS: 100.0000 mg | ORAL_CAPSULE | Freq: Two times a day (BID) | ORAL | 3 refills | Status: DC

## 2016-11-17 MED ORDER — OLMESARTAN MEDOXOMIL 40 MG PO TABS: 40.0000 mg | ORAL_TABLET | Freq: Every day | ORAL | 3 refills | Status: DC

## 2016-11-17 NOTE — Telephone Encounter (Signed)
[facility] called left message regarding a refill on patient's prescriptions. Please call back at [phone]

## 2016-11-18 ENCOUNTER — Encounter: Payer: Self-pay | Admitting: Family Medicine

## 2016-11-18 ENCOUNTER — Ambulatory Visit (HOSPITAL_COMMUNITY)
Admission: RE | Admit: 2016-11-18 | Discharge: 2016-11-18 | Disposition: A | Discharge status: Home / Self Care | Payer: Medicare HMO | Source: Ambulatory Visit | Attending: Family Medicine | Admitting: Family Medicine

## 2016-11-18 DIAGNOSIS — Z780 Asymptomatic menopausal state: Secondary | ICD-10-CM | POA: Insufficient documentation

## 2016-11-18 DIAGNOSIS — Z13820 Encounter for screening for osteoporosis: Secondary | ICD-10-CM | POA: Diagnosis not present

## 2016-11-23 DIAGNOSIS — H524 Presbyopia: Secondary | ICD-10-CM | POA: Diagnosis not present

## 2016-11-23 DIAGNOSIS — H52203 Unspecified astigmatism, bilateral: Secondary | ICD-10-CM | POA: Diagnosis not present

## 2016-11-23 DIAGNOSIS — H2512 Age-related nuclear cataract, left eye: Secondary | ICD-10-CM | POA: Diagnosis not present

## 2016-11-23 DIAGNOSIS — H5212 Myopia, left eye: Secondary | ICD-10-CM | POA: Diagnosis not present

## 2016-11-23 DIAGNOSIS — Z794 Long term (current) use of insulin: Secondary | ICD-10-CM | POA: Diagnosis not present

## 2016-11-23 DIAGNOSIS — E119 Type 2 diabetes mellitus without complications: Secondary | ICD-10-CM | POA: Diagnosis not present

## 2016-11-23 DIAGNOSIS — Z961 Presence of intraocular lens: Secondary | ICD-10-CM | POA: Diagnosis not present

## 2016-11-24 ENCOUNTER — Encounter: Payer: Self-pay | Admitting: Family Medicine

## 2016-11-24 ENCOUNTER — Ambulatory Visit (INDEPENDENT_AMBULATORY_CARE_PROVIDER_SITE_OTHER): Payer: Medicare HMO | Admitting: Family Medicine

## 2016-11-24 VITALS — BP 160/82 | HR 89 | Resp 16 | Ht 61.0 in | Wt 167.0 lb

## 2016-11-24 DIAGNOSIS — M5440 Lumbago with sciatica, unspecified side: Secondary | ICD-10-CM | POA: Diagnosis not present

## 2016-11-24 DIAGNOSIS — M549 Dorsalgia, unspecified: Secondary | ICD-10-CM

## 2016-11-24 DIAGNOSIS — N183 Chronic kidney disease, stage 3 (moderate): Secondary | ICD-10-CM

## 2016-11-24 DIAGNOSIS — E1122 Type 2 diabetes mellitus with diabetic chronic kidney disease: Secondary | ICD-10-CM

## 2016-11-24 DIAGNOSIS — G8929 Other chronic pain: Secondary | ICD-10-CM

## 2016-11-24 DIAGNOSIS — Z23 Encounter for immunization: Secondary | ICD-10-CM | POA: Diagnosis not present

## 2016-11-24 DIAGNOSIS — I10 Essential (primary) hypertension: Secondary | ICD-10-CM

## 2016-11-24 MED ORDER — OLMESARTAN MEDOXOMIL 40 MG PO TABS: 40.0000 mg | ORAL_TABLET | Freq: Every day | ORAL | 3 refills | Status: DC

## 2016-11-24 NOTE — Assessment & Plan Note (Addendum)
Increased and uncontrolled , disc disease with limitation , refer to PT twice weekly for 4 to 6 weeks

## 2016-11-24 NOTE — Patient Instructions (Addendum)
F/u in  6 weeks , call if you need me sooner  Change clonidine to 0.1mg  one and a half in the morning and two at bedtime, or take a clonidine 0.2mg  one at bedtime from what you have at home  Flu vaccine today  You are referred to physical therapy twice weekly for 4 weeks for back pain

## 2016-11-25 ENCOUNTER — Ambulatory Visit: Payer: Medicare HMO | Admitting: Family Medicine

## 2016-11-27 DIAGNOSIS — G8929 Other chronic pain: Secondary | ICD-10-CM | POA: Insufficient documentation

## 2016-11-27 DIAGNOSIS — M549 Dorsalgia, unspecified: Secondary | ICD-10-CM

## 2016-11-27 NOTE — Assessment & Plan Note (Signed)
Christine Dominguez is reminded of the importance of commitment to daily physical activity for 30 minutes or more, as able and the need to limit carbohydrate intake to 30 to 60 grams per meal to help with blood sugar control.   The need to take medication as prescribed, test blood sugar as directed, and to call between visits if there is a concern that blood sugar is uncontrolled is also discussed.   Christine Dominguez is reminded of the importance of daily foot exam, annual eye examination, and good blood sugar, blood pressure and cholesterol control Uncontrolled, treated by endo, needs to adhere to treatment plan in terms of diet and medication  Diabetic Labs Latest Ref Rng & Units 10/13/2016 08/19/2016 06/16/2016 04/01/2016 03/17/2016  HbA1c <5.7 % 8.9(H) - 8.9(H) - 8.2(H)  Microalbumin Not estab mg/dL - - - - 0.5  Micro/Creat Ratio <30 mcg/mg creat - - - - 6  Chol <200 mg/dL - 106 - - -  HDL >50 mg/dL - 37(L) - - -  Calc LDL <100 mg/dL - 48 - - -  Triglycerides <150 mg/dL - 106 - - -  Creatinine 0.50 - 0.99 mg/dL 1.44(H) 1.41(H) 1.41(H) 1.32(H) 1.52(H)   BP/Weight 11/24/2016 10/27/2016 10/21/2016 08/26/2016 08/25/2016 08/02/2016 2/99/2426  Systolic BP 834 196 222 979 892 119 417  Diastolic BP 82 80 89 80 80 80 81  Wt. (Lbs) 167 169.75 168 167.12 167 - 167  BMI 31.55 32.07 31.74 31.58 31.04 - 31.55   Foot/eye exam completion dates Latest Ref Rng & Units 08/26/2016 11/25/2015  Eye Exam No Retinopathy - No Retinopathy  Foot exam Order - - -  Foot Form Completion - Done -

## 2016-11-27 NOTE — Progress Notes (Signed)
Christine Dominguez     MRN: [id number]      DOB: 05-17-1950   HPI Christine Dominguez is here for follow up and re-evaluation of chronic medical conditions, in particular uncontrolled hypertension,medication management and review of any available recent lab and radiology data.  Preventive health is updated, specifically  Cancer screening and Immunization.   Questions or concerns regarding consultations or procedures which the PT has had in the interim are  addressed. The PT denies any adverse reactions to current medications since the last visit.  C/o ongoing back and lower extremity pain and wants therapy  Blood sugar improving , but still experiences a lot of fluctuation , followed by endo  ROS Denies recent fever or chills. Denies sinus pressure, nasal congestion, ear pain or sore throat. Denies chest congestion, productive cough or wheezing. Denies chest pains, palpitations and leg swelling Denies abdominal pain, nausea, vomiting,diarrhea or constipation.   Denies dysuria, frequency, hesitancy or incontinence.  Denies headaches, seizures, numbness, or tingling. Denies depression, anxiety or insomnia. Denies skin break down or rash.   PE  BP (!) 160/82   Pulse 89   Resp 16   Ht 5\' 1"  (1.549 m)   Wt 167 lb (75.8 kg)   SpO2 98%   BMI 31.55 kg/m   Patient alert and oriented and in no cardiopulmonary distress.  HEENT: No facial asymmetry, EOMI,   oropharynx pink and moist.  Neck supple no JVD, no mass.  Chest: Clear to auscultation bilaterally.  CVS: S1, S2 no murmurs, no S3.Regular rate.  ABD: Soft non tender.   Ext: No edema  MS: Decreased  ROM lumbar  Spine, adequate in  shoulders, hips and knees.  Skin: Intact, no ulcerations or rash noted.  Psych: Good eye contact, normal affect. Memory intact not anxious or depressed appearing.  CNS: CN 2-12 intact, power,  normal throughout.no focal deficits noted.   Assessment & Plan  Back pain Increased and  uncontrolled , disc disease with limitation , refer to PT twice weekly for 4 to 6 weeks  Essential hypertension, benign Uncontrolled, increase dose of clonidine DASH diet and commitment to daily physical activity for a minimum of 30 minutes discussed and encouraged, as a part of hypertension management. The importance of attaining a healthy weight is also discussed.  BP/Weight 11/24/2016 10/27/2016 10/21/2016 08/26/2016 08/25/2016 08/02/2016 7/90/2409  Systolic BP 735 329 924 268 341 962 229  Diastolic BP 82 80 89 80 80 80 81  Wt. (Lbs) 167 169.75 168 167.12 167 - 167  BMI 31.55 32.07 31.74 31.58 31.04 - 31.55       Diabetes mellitus with stage 3 chronic kidney disease ([place]) Christine Dominguez is reminded of the importance of commitment to daily physical activity for 30 minutes or more, as able and the need to limit carbohydrate intake to 30 to 60 grams per meal to help with blood sugar control.   The need to take medication as prescribed, test blood sugar as directed, and to call between visits if there is a concern that blood sugar is uncontrolled is also discussed.   Christine Dominguez is reminded of the importance of daily foot exam, annual eye examination, and good blood sugar, blood pressure and cholesterol control Uncontrolled, treated by endo, needs to adhere to treatment plan in terms of diet and medication  Diabetic Labs Latest Ref Rng & Units 10/13/2016 08/19/2016 06/16/2016 04/01/2016 03/17/2016  HbA1c <5.7 % 8.9(H) - 8.9(H) - 8.2(H)  Microalbumin Not estab mg/dL - - - -  0.5  Micro/Creat Ratio <30 mcg/mg creat - - - - 6  Chol <200 mg/dL - 106 - - -  HDL >50 mg/dL - 37(L) - - -  Calc LDL <100 mg/dL - 48 - - -  Triglycerides <150 mg/dL - 106 - - -  Creatinine 0.50 - 0.99 mg/dL 1.44(H) 1.41(H) 1.41(H) 1.32(H) 1.52(H)   BP/Weight 11/24/2016 10/27/2016 10/21/2016 08/26/2016 08/25/2016 08/02/2016 2/95/6213  Systolic BP 086 578 469 629 528 413 244  Diastolic BP 82 80 89 80 80 80 81  Wt.  (Lbs) 167 169.75 168 167.12 167 - 167  BMI 31.55 32.07 31.74 31.58 31.04 - 31.55   Foot/eye exam completion dates Latest Ref Rng & Units 08/26/2016 11/25/2015  Eye Exam No Retinopathy - No Retinopathy  Foot exam Order - - -  Foot Form Completion - Done -        Chronic back pain greater than 3 months duration Limits her ability to do her own housework, seems to be progressive, will refer to PT Epidurals , though have been beneficial are nota good option as her blood sugar is uncontrolled

## 2016-11-27 NOTE — Assessment & Plan Note (Signed)
Uncontrolled, increase dose of clonidine DASH diet and commitment to daily physical activity for a minimum of 30 minutes discussed and encouraged, as a part of hypertension management. The importance of attaining a healthy weight is also discussed.  BP/Weight 11/24/2016 10/27/2016 10/21/2016 08/26/2016 08/25/2016 08/02/2016 5/42/7062  Systolic BP 376 283 151 761 607 371 062  Diastolic BP 82 80 89 80 80 80 81  Wt. (Lbs) 167 169.75 168 167.12 167 - 167  BMI 31.55 32.07 31.74 31.58 31.04 - 31.55

## 2016-11-27 NOTE — Assessment & Plan Note (Signed)
Limits her ability to do her own housework, seems to be progressive, will refer to PT Epidurals , though have been beneficial are nota good option as her blood sugar is uncontrolled

## 2016-12-01 ENCOUNTER — Ambulatory Visit (HOSPITAL_COMMUNITY): Payer: Medicare HMO | Admitting: Physical Therapy

## 2016-12-01 ENCOUNTER — Telehealth (HOSPITAL_COMMUNITY): Payer: Self-pay | Admitting: Physical Therapy

## 2016-12-01 NOTE — Telephone Encounter (Signed)
She is sick and can not come in

## 2016-12-09 ENCOUNTER — Telehealth (HOSPITAL_COMMUNITY): Payer: Self-pay | Admitting: Physical Therapy

## 2016-12-09 ENCOUNTER — Encounter (HOSPITAL_COMMUNITY): Payer: Self-pay

## 2016-12-09 ENCOUNTER — Ambulatory Visit (HOSPITAL_COMMUNITY): Payer: Medicare HMO | Admitting: Physical Therapy

## 2016-12-09 NOTE — Telephone Encounter (Signed)
Pt states she can not afford $40.00 copay and cx this apptment.

## 2016-12-10 ENCOUNTER — Telehealth: Payer: Self-pay | Admitting: Family Medicine

## 2016-12-10 ENCOUNTER — Other Ambulatory Visit: Payer: Self-pay | Admitting: Family Medicine

## 2016-12-10 MED ORDER — TRAMADOL HCL 50 MG PO TABS: ORAL_TABLET | ORAL | 0 refills | Status: DC

## 2016-12-10 NOTE — Telephone Encounter (Signed)
Done

## 2016-12-10 NOTE — Progress Notes (Unsigned)
Tramadol

## 2016-12-10 NOTE — Telephone Encounter (Signed)
Tramadol printed and signed please fax and let her know

## 2016-12-10 NOTE — Telephone Encounter (Signed)
Patient is requesting pain medication, she states she couldn't make it to her physical therapy appts because she cant afford to pay the copay at each visit  Cb#: [phone]

## 2016-12-15 ENCOUNTER — Telehealth (HOSPITAL_COMMUNITY): Payer: Self-pay | Admitting: Physical Therapy

## 2016-12-15 ENCOUNTER — Ambulatory Visit (HOSPITAL_COMMUNITY): Payer: Medicare HMO | Admitting: Physical Therapy

## 2016-12-15 NOTE — Telephone Encounter (Signed)
We do not accept Medicaid [employer] at our office. NF 12/15/16

## 2016-12-15 NOTE — Telephone Encounter (Signed)
Called pt to R/s eval with the understanding that [facility] Copay of 40.00 would be expected on eval date and we could request visit approval from [facility] after the eval and wait to see if they approve any treatments. NF 12/15/16

## 2016-12-16 ENCOUNTER — Telehealth: Payer: Self-pay | Admitting: Family Medicine

## 2016-12-16 NOTE — Telephone Encounter (Signed)
She already has tramadol sent in recently pls let her know that is what I have prescribed , referral just needs to be made to that facility, she was just referred to PT said she could not afford it so not sure if lower cost with this facility I tried to speak with her, no answer. When you are able pls find out if that is the situation, if so pls refer specifically to that facility for chronic back pain with radiation twice weekly for 6 weeks , I will sign Thanks

## 2016-12-16 NOTE — Telephone Encounter (Signed)
Patient is requesting a physical therapy script to [facility]. Also, he is requesting medication for her back pain.

## 2016-12-22 DIAGNOSIS — M545 Low back pain: Secondary | ICD-10-CM | POA: Diagnosis not present

## 2016-12-22 DIAGNOSIS — R293 Abnormal posture: Secondary | ICD-10-CM | POA: Diagnosis not present

## 2016-12-22 DIAGNOSIS — M2560 Stiffness of unspecified joint, not elsewhere classified: Secondary | ICD-10-CM | POA: Diagnosis not present

## 2016-12-22 DIAGNOSIS — G8929 Other chronic pain: Secondary | ICD-10-CM | POA: Diagnosis not present

## 2016-12-23 DIAGNOSIS — G8929 Other chronic pain: Secondary | ICD-10-CM | POA: Diagnosis not present

## 2016-12-23 DIAGNOSIS — M545 Low back pain: Secondary | ICD-10-CM | POA: Diagnosis not present

## 2016-12-23 DIAGNOSIS — R293 Abnormal posture: Secondary | ICD-10-CM | POA: Diagnosis not present

## 2016-12-23 DIAGNOSIS — M2560 Stiffness of unspecified joint, not elsewhere classified: Secondary | ICD-10-CM | POA: Diagnosis not present

## 2016-12-24 NOTE — Telephone Encounter (Signed)
Unable to reach patient for follow up 

## 2016-12-29 DIAGNOSIS — R293 Abnormal posture: Secondary | ICD-10-CM | POA: Diagnosis not present

## 2016-12-29 DIAGNOSIS — G8929 Other chronic pain: Secondary | ICD-10-CM | POA: Diagnosis not present

## 2016-12-29 DIAGNOSIS — M545 Low back pain: Secondary | ICD-10-CM | POA: Diagnosis not present

## 2016-12-29 DIAGNOSIS — M2560 Stiffness of unspecified joint, not elsewhere classified: Secondary | ICD-10-CM | POA: Diagnosis not present

## 2016-12-31 ENCOUNTER — Telehealth: Payer: Self-pay | Admitting: Family Medicine

## 2016-12-31 DIAGNOSIS — G8929 Other chronic pain: Secondary | ICD-10-CM | POA: Diagnosis not present

## 2016-12-31 DIAGNOSIS — R293 Abnormal posture: Secondary | ICD-10-CM | POA: Diagnosis not present

## 2016-12-31 DIAGNOSIS — M545 Low back pain: Secondary | ICD-10-CM | POA: Diagnosis not present

## 2016-12-31 DIAGNOSIS — M2560 Stiffness of unspecified joint, not elsewhere classified: Secondary | ICD-10-CM | POA: Diagnosis not present

## 2016-12-31 NOTE — Telephone Encounter (Signed)
Called patient to see how significant damage to hand is- bruised? Broken? Bleeding?- no answer, unable to leave message.

## 2016-12-31 NOTE — Telephone Encounter (Signed)
Patient has appt with Dr. Moshe Cipro on 01-06-17 but is requesting an xray of the R hand because it is sore.  She knows that she has arthritis but the hand is swollen from when she hit it on the console in her car a couple of days ago.

## 2016-12-31 NOTE — Telephone Encounter (Signed)
If she had significant trauma may have R hand x ray

## 2017-01-03 ENCOUNTER — Telehealth: Payer: Self-pay | Admitting: Family Medicine

## 2017-01-03 DIAGNOSIS — M545 Low back pain: Secondary | ICD-10-CM | POA: Diagnosis not present

## 2017-01-03 DIAGNOSIS — M2560 Stiffness of unspecified joint, not elsewhere classified: Secondary | ICD-10-CM | POA: Diagnosis not present

## 2017-01-03 DIAGNOSIS — R293 Abnormal posture: Secondary | ICD-10-CM | POA: Diagnosis not present

## 2017-01-03 DIAGNOSIS — G8929 Other chronic pain: Secondary | ICD-10-CM | POA: Diagnosis not present

## 2017-01-03 NOTE — Telephone Encounter (Signed)
Patient said she hit her hand on the console in her car, she has swelling and pain since last Wednesday. She called today to let us know it is still swollen, she is using ice. [phone]

## 2017-01-04 ENCOUNTER — Other Ambulatory Visit: Payer: Self-pay | Admitting: "Endocrinology

## 2017-01-05 ENCOUNTER — Ambulatory Visit (HOSPITAL_COMMUNITY)
Admission: RE | Admit: 2017-01-05 | Discharge: 2017-01-05 | Disposition: A | Discharge status: Home / Self Care | Payer: Medicare HMO | Source: Ambulatory Visit | Attending: Family Medicine | Admitting: Family Medicine

## 2017-01-05 ENCOUNTER — Other Ambulatory Visit: Payer: Self-pay | Admitting: Family Medicine

## 2017-01-05 DIAGNOSIS — R293 Abnormal posture: Secondary | ICD-10-CM | POA: Diagnosis not present

## 2017-01-05 DIAGNOSIS — M545 Low back pain: Secondary | ICD-10-CM | POA: Diagnosis not present

## 2017-01-05 DIAGNOSIS — M79641 Pain in right hand: Secondary | ICD-10-CM | POA: Insufficient documentation

## 2017-01-05 DIAGNOSIS — G8929 Other chronic pain: Secondary | ICD-10-CM | POA: Diagnosis not present

## 2017-01-05 DIAGNOSIS — M2560 Stiffness of unspecified joint, not elsewhere classified: Secondary | ICD-10-CM | POA: Diagnosis not present

## 2017-01-05 NOTE — Telephone Encounter (Signed)
Spoke with patient  X ray ordered

## 2017-01-06 ENCOUNTER — Encounter: Payer: Self-pay | Admitting: Family Medicine

## 2017-01-06 ENCOUNTER — Ambulatory Visit (INDEPENDENT_AMBULATORY_CARE_PROVIDER_SITE_OTHER): Payer: Medicare HMO | Admitting: Family Medicine

## 2017-01-06 VITALS — BP 146/72 | HR 80 | Temp 97.7°F | Resp 16 | Ht 61.0 in | Wt 169.5 lb

## 2017-01-06 DIAGNOSIS — N183 Chronic kidney disease, stage 3 unspecified: Secondary | ICD-10-CM

## 2017-01-06 DIAGNOSIS — M5440 Lumbago with sciatica, unspecified side: Secondary | ICD-10-CM | POA: Diagnosis not present

## 2017-01-06 DIAGNOSIS — E1122 Type 2 diabetes mellitus with diabetic chronic kidney disease: Secondary | ICD-10-CM

## 2017-01-06 DIAGNOSIS — I10 Essential (primary) hypertension: Secondary | ICD-10-CM | POA: Diagnosis not present

## 2017-01-06 DIAGNOSIS — M79641 Pain in right hand: Secondary | ICD-10-CM | POA: Diagnosis not present

## 2017-01-06 NOTE — Patient Instructions (Addendum)
F/u in 3 months, call if you need me sooner  Fasting lipid and hepatic panel same day as you have labs for Dr Dorris Fetch  Excellent blood pressure, much improved, no change in medication  Work on weight loss  To help blood pressure ,    No broken bones in hand, use cold compress and tylenol , give it time for pain to be relieved and do not hit it again. If it still hurts and you are concerned you will need  To go to ortho Doc  It is important that you exercise regularly at least 30 minutes 5 times a week. If you develop chest pain, have severe difficulty breathing, or feel very tired, stop exercising immediately and seek medical attention    Thank you  for choosing [facility]. We consider it a privelige to serve you.  Delivering excellent health care in a caring and  compassionate way is our goal.  Partnering with you,  so that together we can achieve this goal is our strategy.

## 2017-01-07 DIAGNOSIS — G8929 Other chronic pain: Secondary | ICD-10-CM | POA: Diagnosis not present

## 2017-01-07 DIAGNOSIS — M2560 Stiffness of unspecified joint, not elsewhere classified: Secondary | ICD-10-CM | POA: Diagnosis not present

## 2017-01-07 DIAGNOSIS — M545 Low back pain: Secondary | ICD-10-CM | POA: Diagnosis not present

## 2017-01-07 DIAGNOSIS — R293 Abnormal posture: Secondary | ICD-10-CM | POA: Diagnosis not present

## 2017-01-10 DIAGNOSIS — G8929 Other chronic pain: Secondary | ICD-10-CM | POA: Diagnosis not present

## 2017-01-10 DIAGNOSIS — M545 Low back pain: Secondary | ICD-10-CM | POA: Diagnosis not present

## 2017-01-10 DIAGNOSIS — M2560 Stiffness of unspecified joint, not elsewhere classified: Secondary | ICD-10-CM | POA: Diagnosis not present

## 2017-01-10 DIAGNOSIS — R293 Abnormal posture: Secondary | ICD-10-CM | POA: Diagnosis not present

## 2017-01-12 DIAGNOSIS — M2560 Stiffness of unspecified joint, not elsewhere classified: Secondary | ICD-10-CM | POA: Diagnosis not present

## 2017-01-12 DIAGNOSIS — G8929 Other chronic pain: Secondary | ICD-10-CM | POA: Diagnosis not present

## 2017-01-12 DIAGNOSIS — M545 Low back pain: Secondary | ICD-10-CM | POA: Diagnosis not present

## 2017-01-12 DIAGNOSIS — R293 Abnormal posture: Secondary | ICD-10-CM | POA: Diagnosis not present

## 2017-01-14 DIAGNOSIS — M2560 Stiffness of unspecified joint, not elsewhere classified: Secondary | ICD-10-CM | POA: Diagnosis not present

## 2017-01-14 DIAGNOSIS — R293 Abnormal posture: Secondary | ICD-10-CM | POA: Diagnosis not present

## 2017-01-14 DIAGNOSIS — G8929 Other chronic pain: Secondary | ICD-10-CM | POA: Diagnosis not present

## 2017-01-14 DIAGNOSIS — M545 Low back pain: Secondary | ICD-10-CM | POA: Diagnosis not present

## 2017-01-15 DIAGNOSIS — M79641 Pain in right hand: Secondary | ICD-10-CM | POA: Insufficient documentation

## 2017-01-15 NOTE — Assessment & Plan Note (Signed)
Improved , no med change DASH diet and commitment to daily physical activity for a minimum of 30 minutes discussed and encouraged, as a part of hypertension management. The importance of attaining a healthy weight is also discussed.  BP/Weight 01/06/2017 11/24/2016 10/27/2016 10/21/2016 08/26/2016 08/25/2016 7/84/7841  Systolic BP 282 081 388 719 597 471 855  Diastolic BP 72 82 80 89 80 80 80  Wt. (Lbs) 169.5 167 169.75 168 167.12 167 -  BMI 32.03 31.55 32.07 31.74 31.58 31.04 -

## 2017-01-15 NOTE — Assessment & Plan Note (Signed)
Uncontrolled and managed by endo, compliance is encouraged Christine Dominguez is reminded of the importance of commitment to daily physical activity for 30 minutes or more, as able and the need to limit carbohydrate intake to 30 to 60 grams per meal to help with blood sugar control.   The need to take medication as prescribed, test blood sugar as directed, and to call between visits if there is a concern that blood sugar is uncontrolled is also discussed.   Christine Dominguez is reminded of the importance of daily foot exam, annual eye examination, and good blood sugar, blood pressure and cholesterol control.  Diabetic Labs Latest Ref Rng & Units 10/13/2016 08/19/2016 06/16/2016 04/01/2016 03/17/2016  HbA1c <5.7 % 8.9(H) - 8.9(H) - 8.2(H)  Microalbumin Not estab mg/dL - - - - 0.5  Micro/Creat Ratio <30 mcg/mg creat - - - - 6  Chol <200 mg/dL - 106 - - -  HDL >50 mg/dL - 37(L) - - -  Calc LDL <100 mg/dL - 48 - - -  Triglycerides <150 mg/dL - 106 - - -  Creatinine 0.50 - 0.99 mg/dL 1.44(H) 1.41(H) 1.41(H) 1.32(H) 1.52(H)   BP/Weight 01/06/2017 11/24/2016 10/27/2016 10/21/2016 08/26/2016 08/25/2016 5/43/6067  Systolic BP 703 403 524 818 590 931 121  Diastolic BP 72 82 80 89 80 80 80  Wt. (Lbs) 169.5 167 169.75 168 167.12 167 -  BMI 32.03 31.55 32.07 31.74 31.58 31.04 -   Foot/eye exam completion dates Latest Ref Rng & Units 08/26/2016 11/25/2015  Eye Exam No Retinopathy - No Retinopathy  Foot exam Order - - -  Foot Form Completion - Done -

## 2017-01-15 NOTE — Progress Notes (Signed)
Christine Dominguez     MRN: [id number]      DOB: 1951/01/20   HPI Christine Dominguez is here for follow up and re-evaluation of chronic medical conditions, medication management and review of any available recent lab and radiology data.  Preventive health is updated, specifically  Cancer screening and Immunization.   Questions or concerns regarding consultations or procedures which the Christine Dominguez has had in the interim are  addressed. The Christine Dominguez denies any adverse reactions to current medications since the last visit.  C/o uncontrolled blood sugarsROS Denies recent fever or chills. Denies sinus pressure, nasal congestion, ear pain or sore throat. Denies chest congestion, productive cough or wheezing. Denies chest pains, palpitations and leg swelling Denies abdominal pain, nausea, vomiting,diarrhea or constipation.   Denies dysuria, frequency, hesitancy or incontinence. C/o chronic back pain and limitation in mobility Denies headaches, seizures, numbness, or tingling. Denies depression, anxiety or insomnia. Denies skin break down or rash.   PE  BP (!) 146/72   Pulse 80   Temp 97.7 F (36.5 C) (Other (Comment))   Resp 16   Ht 5\' 1"  (1.549 m)   Wt 169 lb 8 oz (76.9 kg)   SpO2 98%   BMI 32.03 kg/m   Patient alert and oriented and in no cardiopulmonary distress.  HEENT: No facial asymmetry, EOMI,   oropharynx pink and moist.  Neck supple no JVD, no mass.  Chest: Clear to auscultation bilaterally.  CVS: S1, S2 no murmurs, no S3.Regular rate.  ABD: Soft non tender.   Ext: No edema  MS: Adequate ROM spine, shoulders, hips and knees.  Skin: Intact, no ulcerations or rash noted.  Psych: Good eye contact, normal affect. Memory intact not anxious or depressed appearing.  CNS: CN 2-12 intact, power,  normal throughout.no focal deficits noted.   Assessment & Plan Malignant hypertension Improved , no med change DASH diet and commitment to daily physical activity for a minimum  of 30 minutes discussed and encouraged, as a part of hypertension management. The importance of attaining a healthy weight is also discussed.  BP/Weight 01/06/2017 11/24/2016 10/27/2016 10/21/2016 08/26/2016 08/25/2016 10/09/2374  Systolic BP 283 151 761 607 371 062 694  Diastolic BP 72 82 80 89 80 80 80  Wt. (Lbs) 169.5 167 169.75 168 167.12 167 -  BMI 32.03 31.55 32.07 31.74 31.58 31.04 -       Hand pain, right Improving following acute trauma, x ray negative for fracture , Christine Dominguez reassured, avoidance of futureaumas is stressed  Back pain With physical therapy , Christine Dominguez is to continue the program  Diabetes mellitus with stage 3 chronic kidney disease ([place]) Uncontrolled and managed by endo, compliance is encouraged Christine Dominguez is reminded of the importance of commitment to daily physical activity for 30 minutes or more, as able and the need to limit carbohydrate intake to 30 to 60 grams per meal to help with blood sugar control.   The need to take medication as prescribed, test blood sugar as directed, and to call between visits if there is a concern that blood sugar is uncontrolled is also discussed.   Christine Dominguez is reminded of the importance of daily foot exam, annual eye examination, and good blood sugar, blood pressure and cholesterol control.  Diabetic Labs Latest Ref Rng & Units 10/13/2016 08/19/2016 06/16/2016 04/01/2016 03/17/2016  HbA1c <5.7 % 8.9(H) - 8.9(H) - 8.2(H)  Microalbumin Not estab mg/dL - - - - 0.5  Micro/Creat Ratio <30 mcg/mg creat - - - -  6  Chol <200 mg/dL - 106 - - -  HDL >50 mg/dL - 37(L) - - -  Calc LDL <100 mg/dL - 48 - - -  Triglycerides <150 mg/dL - 106 - - -  Creatinine 0.50 - 0.99 mg/dL 1.44(H) 1.41(H) 1.41(H) 1.32(H) 1.52(H)   BP/Weight 01/06/2017 11/24/2016 10/27/2016 10/21/2016 08/26/2016 08/25/2016 09/17/7090  Systolic BP 957 473 403 709 643 838 184  Diastolic BP 72 82 80 89 80 80 80  Wt. (Lbs) 169.5 167 169.75 168 167.12 167 -  BMI 32.03 31.55  32.07 31.74 31.58 31.04 -   Foot/eye exam completion dates Latest Ref Rng & Units 08/26/2016 11/25/2015  Eye Exam No Retinopathy - No Retinopathy  Foot exam Order - - -  Foot Form Completion - Done -

## 2017-01-15 NOTE — Assessment & Plan Note (Signed)
Improving following acute trauma, x ray negative for fracture , pt reassured, avoidance of futureaumas is stressed

## 2017-01-15 NOTE — Assessment & Plan Note (Signed)
With physical therapy , she is to continue the program

## 2017-01-17 DIAGNOSIS — M545 Low back pain: Secondary | ICD-10-CM | POA: Diagnosis not present

## 2017-01-17 DIAGNOSIS — G8929 Other chronic pain: Secondary | ICD-10-CM | POA: Diagnosis not present

## 2017-01-17 DIAGNOSIS — M2560 Stiffness of unspecified joint, not elsewhere classified: Secondary | ICD-10-CM | POA: Diagnosis not present

## 2017-01-17 DIAGNOSIS — R293 Abnormal posture: Secondary | ICD-10-CM | POA: Diagnosis not present

## 2017-01-18 ENCOUNTER — Encounter: Payer: Self-pay | Admitting: Family Medicine

## 2017-01-18 DIAGNOSIS — I10 Essential (primary) hypertension: Secondary | ICD-10-CM | POA: Diagnosis not present

## 2017-01-18 DIAGNOSIS — E1122 Type 2 diabetes mellitus with diabetic chronic kidney disease: Secondary | ICD-10-CM | POA: Diagnosis not present

## 2017-01-18 DIAGNOSIS — N183 Chronic kidney disease, stage 3 (moderate): Secondary | ICD-10-CM | POA: Diagnosis not present

## 2017-01-18 LAB — HEPATIC FUNCTION PANEL
AG Ratio: 1.2 (calc) (ref 1.0–2.5)
ALBUMIN MSPROF: 4 g/dL (ref 3.6–5.1)
ALT: 10 U/L (ref 6–29)
AST: 11 U/L (ref 10–35)
Alkaline phosphatase (APISO): 66 U/L (ref 33–130)
Bilirubin, Direct: 0.1 mg/dL (ref 0.0–0.2)
Globulin: 3.4 g/dL (calc) (ref 1.9–3.7)
Indirect Bilirubin: 0.2 mg/dL (calc) (ref 0.2–1.2)
Total Bilirubin: 0.3 mg/dL (ref 0.2–1.2)
Total Protein: 7.4 g/dL (ref 6.1–8.1)

## 2017-01-18 LAB — LIPID PANEL
CHOL/HDL RATIO: 2.9 (calc) (ref <5.0)
CHOLESTEROL: 109 mg/dL (ref <200)
HDL: 37 mg/dL — AB (ref >50)
LDL Cholesterol (Calc): 50 mg/dL (calc)
NON-HDL CHOLESTEROL (CALC): 72 mg/dL (ref <130)
TRIGLYCERIDES: 139 mg/dL (ref <150)

## 2017-01-19 ENCOUNTER — Other Ambulatory Visit: Payer: Self-pay | Admitting: Family Medicine

## 2017-01-19 DIAGNOSIS — M545 Low back pain: Secondary | ICD-10-CM | POA: Diagnosis not present

## 2017-01-19 DIAGNOSIS — G8929 Other chronic pain: Secondary | ICD-10-CM | POA: Diagnosis not present

## 2017-01-19 DIAGNOSIS — R293 Abnormal posture: Secondary | ICD-10-CM | POA: Diagnosis not present

## 2017-01-19 DIAGNOSIS — M2560 Stiffness of unspecified joint, not elsewhere classified: Secondary | ICD-10-CM | POA: Diagnosis not present

## 2017-01-19 LAB — RENAL FUNCTION PANEL
ALBUMIN MSPROF: 3.9 g/dL (ref 3.6–5.1)
BUN / CREAT RATIO: 14 (calc) (ref 6–22)
BUN: 21 mg/dL (ref 7–25)
CO2: 27 mmol/L (ref 20–32)
CREATININE: 1.49 mg/dL — AB (ref 0.50–0.99)
Calcium: 9.7 mg/dL (ref 8.6–10.4)
Chloride: 104 mmol/L (ref 98–110)
Glucose, Bld: 141 mg/dL — ABNORMAL HIGH (ref 65–99)
Phosphorus: 3.3 mg/dL (ref 2.1–4.3)
Potassium: 4.2 mmol/L (ref 3.5–5.3)
SODIUM: 137 mmol/L (ref 135–146)

## 2017-01-19 LAB — HEMOGLOBIN A1C
Hgb A1c MFr Bld: 8.7 % of total Hgb — ABNORMAL HIGH (ref <5.7)
MEAN PLASMA GLUCOSE: 203 (calc)
eAG (mmol/L): 11.2 (calc)

## 2017-01-20 DIAGNOSIS — R293 Abnormal posture: Secondary | ICD-10-CM | POA: Diagnosis not present

## 2017-01-20 DIAGNOSIS — M2560 Stiffness of unspecified joint, not elsewhere classified: Secondary | ICD-10-CM | POA: Diagnosis not present

## 2017-01-20 DIAGNOSIS — G8929 Other chronic pain: Secondary | ICD-10-CM | POA: Diagnosis not present

## 2017-01-20 DIAGNOSIS — M545 Low back pain: Secondary | ICD-10-CM | POA: Diagnosis not present

## 2017-01-25 ENCOUNTER — Encounter: Payer: Self-pay | Admitting: "Endocrinology

## 2017-01-25 ENCOUNTER — Other Ambulatory Visit: Payer: Self-pay | Admitting: "Endocrinology

## 2017-01-25 ENCOUNTER — Ambulatory Visit (INDEPENDENT_AMBULATORY_CARE_PROVIDER_SITE_OTHER): Payer: Medicare HMO | Admitting: "Endocrinology

## 2017-01-25 VITALS — BP 164/91 | HR 83 | Ht 61.0 in | Wt 168.0 lb

## 2017-01-25 DIAGNOSIS — N183 Chronic kidney disease, stage 3 (moderate): Secondary | ICD-10-CM

## 2017-01-25 DIAGNOSIS — E039 Hypothyroidism, unspecified: Secondary | ICD-10-CM

## 2017-01-25 DIAGNOSIS — E1122 Type 2 diabetes mellitus with diabetic chronic kidney disease: Secondary | ICD-10-CM | POA: Diagnosis not present

## 2017-01-25 DIAGNOSIS — I10 Essential (primary) hypertension: Secondary | ICD-10-CM | POA: Diagnosis not present

## 2017-01-25 DIAGNOSIS — E782 Mixed hyperlipidemia: Secondary | ICD-10-CM | POA: Diagnosis not present

## 2017-01-25 NOTE — Progress Notes (Signed)
Subjective:    Patient ID: Christine Dominguez, female    DOB: 02-22-1951, PCP [name], MD   Past Medical History:  Diagnosis Date  . Anemia   . Arthritis    Cervical spine  . 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 Chronic back pain 2007   Disabled   . Essential hypertension, benign 1980  . GERD (gastroesophageal reflux disease) 2000  . Hydrocephalus    Treated with VP shunt and then intracranial tumor resection (?Cholesteatoma); no longer followed actively by neurosurgery or otolaryngology  . Hyperlipidemia 2013  . Hypothyroidism 2013  . Neuropathy   . Obesity   . Type 2 diabetes mellitus ([place]) 1991   Past Surgical History:  Procedure Laterality Date  . CATARACT EXTRACTION W/PHACO Right 07/17/2013   Procedure: CATARACT EXTRACTION PHACO AND INTRAOCULAR LENS PLACEMENT (IOC);  Surgeon: [name], MD;  Location: [facility];  Service: Ophthalmology;  Laterality: Right;  CDE:7.24  . COLONOSCOPY  06/2010  . Hemorrhoidectomy    . Intracranial mass resected  1998   At [facility]; ? location-right middle ear  . Teeth pulled    . TRIGGER FINGER RELEASE     Rght thumb  . TUBAL LIGATION  1980  . TUBAL LIGATION    . VENTRICULOPERITONEAL SHUNT  1998   At [facility] History   Social History  . Marital status: Widowed    Spouse name: N/A  . Number of children: 3  . Years of education: N/A   Occupational History  . disabled     Social History Main Topics  . Smoking status: Former Smoker    Packs/day: 0.50    Years: 15.00    Types: Cigarettes    Quit date: 07/07/2001  . Smokeless tobacco: Never Used  . Alcohol use No  . Drug use: No  . Sexual activity: No     Comment: tubal   Other Topics Concern  . None   Social History Narrative   Mother of 3 - all children are deceased         Outpatient Encounter Prescriptions as of 01/25/2017  Medication Sig  . ACCU-CHEK SMARTVIEW test strip TEST BLOOD  SUGAR UP TO 4 TIMES DAILY.  Marland Kitchen Alcohol Swabs (B-D SINGLE USE SWABS REGULAR) PADS TEST FOUR TIMES A DAY  . amLODipine (NORVASC) 5 MG tablet Take 5 mg by mouth.  Marland Kitchen aspirin EC 81 MG tablet Take 81 mg by mouth at bedtime.  . B-D ULTRAFINE III SHORT PEN 31G X 8 MM MISC USE  TO INJECT EVERY DAY  WITH  LANTUS  . cloNIDine (CATAPRES) 0.1 MG tablet 1.5 tabs twice daily  . docusate sodium (STOOL SOFTENER) 100 MG capsule Take 100 mg by mouth as needed for mild constipation.  . ferrous sulfate 325 (65 FE) MG EC tablet Take 325 mg by mouth daily.  Marland Kitchen gabapentin (NEURONTIN) 100 MG capsule Take 1 capsule (100 mg total) by mouth 2 (two) times daily.  Marland Kitchen glucose (SUNMARK GLUCOSE) 4 GM chewable tablet Chew by mouth as needed.   . hydrochlorothiazide (HYDRODIURIL) 25 MG tablet Take 1 tablet (25 mg total) by mouth daily.  Marland Kitchen LANTUS SOLOSTAR 100 UNIT/ML Solostar Pen INJECT 50 UNITS INTO THE SKIN AT BEDTIME  . levothyroxine (SYNTHROID, LEVOTHROID) 88 MCG tablet TAKE 1 TABLET EVERY DAY BEFORE BREAKFAST  . nystatin (MYCOSTATIN/NYSTOP) powder APPLY TOPICALLY TO RASH ON LOWER ABDOMEN AT BEDTIME AS NEEDED  .  olmesartan (BENICAR) 40 MG tablet Take 1 tablet (40 mg total) by mouth daily.  Marland Kitchen omeprazole (PRILOSEC) 40 MG capsule TAKE 1 CAPSULE EVERY DAY  . potassium chloride (K-DUR) 10 MEQ tablet Take 1 tablet (10 mEq total) by mouth 2 (two) times daily.  . Simethicone (GAS RELIEF DROPS PO) Take by mouth as needed.  . simvastatin (ZOCOR) 10 MG tablet TAKE 1 TABLET AT BEDTIME  . sodium chloride (OCEAN) 0.65 % nasal spray 1 spray as needed.   . TRADJENTA 5 MG TABS tablet TAKE 1 TABLET (5 MG TOTAL) BY MOUTH DAILY.  Marland Kitchen Vitamin D, Ergocalciferol, (DRISDOL) 50000 units CAPS capsule TAKE 1 CAPSULE EVERY 7 DAYS  . [DISCONTINUED] cloNIDine (CATAPRES) 0.1 MG tablet One and half tablet in the morning and two tablets at bedtime  . [DISCONTINUED] tiZANidine (ZANAFLEX) 2 MG tablet Take 1 tablet (2 mg total) by mouth daily as needed for muscle  spasms. (Patient not taking: Reported on 01/06/2017)  . [DISCONTINUED] traMADol (ULTRAM) 50 MG tablet One tablet at bedtime as needed for uncontrolled pain (Patient not taking: Reported on 01/06/2017)   No facility-administered encounter medications on file as of 01/25/2017.    ALLERGIES: Allergies  Allergen Reactions  . Amlodipine Swelling    Itching and leg swelling  . Amaryl   . Avandia [Rosiglitazone Maleate]   . Glimepiride Itching  . Glipizide Itching  . Invokana [Canagliflozin]   . Lyrica [Pregabalin] Nausea And Vomiting  . Novolog [Insulin Aspart]   . Rosiglitazone     Other reaction(s): OTHER  . Sitagliptin     Other reaction(s): OTHER  . Aspirin Nausea Only and Other (See Comments)    Other Reaction: GI Upset; CAN TAKE 81 mg More of a burning feeling when she take it Burning in stomach, nausea   VACCINATION STATUS: Immunization History  Administered Date(s) Administered  . Influenza Split 02/15/2012  . Influenza Whole 01/06/2010  . Influenza,inj,Quad PF,6+ Mos 05/14/2013, 01/17/2014, 04/03/2015, 12/17/2015, 11/24/2016  . Pneumococcal Conjugate-13 08/08/2014  . Pneumococcal Polysaccharide-23 10/13/2009, 08/12/2015  . Tdap 08/24/2005  . Zoster 10/23/2012    Diabetes  She presents for her follow-up diabetic visit. She has type 2 diabetes mellitus. Her disease course has been stable. There are no hypoglycemic associated symptoms. Pertinent negatives for hypoglycemia include no confusion, headaches, pallor or seizures. Associated symptoms include fatigue, polydipsia and polyuria. Pertinent negatives for diabetes include no chest pain and no polyphagia. There are no hypoglycemic complications. Symptoms are stable. Diabetic complications include nephropathy. Risk factors for coronary artery disease include dyslipidemia, diabetes mellitus and sedentary lifestyle. Current diabetic treatment includes insulin injections. She is compliant with treatment some of the time. Her weight  is stable. She is following a generally unhealthy diet. When asked about meal planning, she reported none. She has had a previous visit with a dietitian. She rarely participates in exercise. Her home blood glucose trend is increasing steadily. Her breakfast blood glucose range is generally 140-180 mg/dl. Her bedtime blood glucose range is generally 180-200 mg/dl. Her overall blood glucose range is 180-200 mg/dl. (She denies hypoglycemia, but she worries about it . ) An ACE inhibitor/angiotensin II receptor blocker is being taken.  Hyperlipidemia  This is a chronic problem. The current episode started more than 1 year ago. Exacerbating diseases include diabetes, hypothyroidism and obesity. Pertinent negatives include no chest pain, myalgias or shortness of breath. Current antihyperlipidemic treatment includes statins.  Hypertension  This is a chronic problem. The current episode started more than 1 year ago. Pertinent  negatives include no chest pain, headaches, palpitations or shortness of breath. Risk factors for coronary artery disease include diabetes mellitus, dyslipidemia, obesity and sedentary lifestyle.      Review of Systems  Constitutional: Positive for fatigue. Negative for chills, fever and unexpected weight change.  HENT: Negative for trouble swallowing and voice change.   Eyes: Negative for visual disturbance.  Respiratory: Negative for cough, shortness of breath and wheezing.   Cardiovascular: Negative for chest pain, palpitations and leg swelling.  Gastrointestinal: Negative for diarrhea, nausea and vomiting.  Endocrine: Positive for polydipsia and polyuria. Negative for cold intolerance, heat intolerance and polyphagia.  Musculoskeletal: Negative for arthralgias and myalgias.  Skin: Negative for color change, pallor, rash and wound.  Neurological: Negative for seizures and headaches.  Psychiatric/Behavioral: Negative for confusion and suicidal ideas.    Objective:    BP (!)  164/91   Pulse 83   Ht 5\' 1"  (1.549 m)   Wt 168 lb (76.2 kg)   BMI 31.74 kg/m   Wt Readings from Last 3 Encounters:  01/25/17 168 lb (76.2 kg)  01/06/17 169 lb 8 oz (76.9 kg)  11/24/16 167 lb (75.8 kg)    Physical Exam  Constitutional: She is oriented to person, place, and time. She appears well-developed.  HENT:  Head: Normocephalic and atraumatic.  Eyes: EOM are normal.  Neck: Normal range of motion. Neck supple. No tracheal deviation present. No thyromegaly present.  Cardiovascular: Normal rate and regular rhythm.   Pulmonary/Chest: Effort normal and breath sounds normal.  Abdominal: Soft. Bowel sounds are normal. There is no tenderness. There is no guarding.  Musculoskeletal: Normal range of motion. She exhibits no edema.  Neurological: She is alert and oriented to person, place, and time. She has normal reflexes. No cranial nerve deficit. Coordination normal.  Skin: Skin is warm and dry. No rash noted. No erythema. No pallor.  Psychiatric: She has a normal mood and affect. Judgment normal.    Results for orders placed or performed in visit on 01/06/17  Lipid panel  Result Value Ref Range   Cholesterol 109 <200 mg/dL   HDL 37 (L) >50 mg/dL   Triglycerides 139 <150 mg/dL   LDL Cholesterol (Calc) 50 mg/dL (calc)   Total CHOL/HDL Ratio 2.9 <5.0 (calc)   Non-HDL Cholesterol (Calc) 72 <130 mg/dL (calc)  Hepatic function panel  Result Value Ref Range   Total Protein 7.4 6.1 - 8.1 g/dL   Albumin 4.0 3.6 - 5.1 g/dL   Globulin 3.4 1.9 - 3.7 g/dL (calc)   AG Ratio 1.2 1.0 - 2.5 (calc)   Total Bilirubin 0.3 0.2 - 1.2 mg/dL   Bilirubin, Direct 0.1 0.0 - 0.2 mg/dL   Indirect Bilirubin 0.2 0.2 - 1.2 mg/dL (calc)   Alkaline phosphatase (APISO) 66 33 - 130 U/L   AST 11 10 - 35 U/L   ALT 10 6 - 29 U/L   Diabetic Labs (most recent): Lab Results  Component Value Date   HGBA1C 8.7 (H) 01/18/2017   HGBA1C 8.9 (H) 10/13/2016   HGBA1C 8.9 (H) 06/16/2016   Lipid Panel      Component Value Date/Time   CHOL 109 01/18/2017 0736   TRIG 139 01/18/2017 0736   HDL 37 (L) 01/18/2017 0736   CHOLHDL 2.9 01/18/2017 0736   VLDL 21 08/19/2016 0708   LDLCALC 48 08/19/2016 0708     Assessment & Plan:   1. Diabetes mellitus with stage 3 chronic kidney disease ([place]) -She remains at a high risk  for more acute and chronic complications of diabetes which include CAD, CVA, CKD, retinopathy, and neuropathy. These are all discussed in detail with the patient.  Patient came with  above target glucose profile, and  recent A1c of  8.7% unchanged from prior study. Glucose logs and insulin administration records pertaining to this visit,  to be scanned into patient's records.  Recent labs reviewed.   - I have re-counseled the patient on diet management andweight loss  by adopting a carbohydrate restricted / protein rich  Diet.  -  Suggestion is made for her to avoid simple carbohydrates  from her diet including Cakes, Sweet Desserts / Pastries, Ice Cream, Soda (diet and regular), Sweet Tea, Candies, Chips, Cookies, Store Bought Juices, Alcohol in Excess of  1-2 drinks a day, Artificial Sweeteners, and "Sugar-free" Products. This will help patient to have stable blood glucose profile and potentially avoid unintended weight gain.   - Patient is advised to stick to a routine mealtimes to eat 3 meals  a day and avoid unnecessary snacks ( to snack only to correct hypoglycemia).   - I have approached patient with the following individualized plan to manage diabetes and patient agrees.  She does not want to tighten control ,  due to fear of hypoglycemia.  - She wishes to keep her Lantus at 60 units at bedtime associated with strict monitoring of blood glucose 2 times a day-daily before breakfast and at bedtime.  -Continue Tradjenta 5 mg by mouth every morning.   - She did not like Invokana .  - Patient specific target  for A1c; LDL, HDL, Triglycerides, and  Waist Circumference were  discussed in detail.  2) BP/HTN: uncontrolled.  She did have recent adjustment in her blood pressure medications. Her hydrochlorothiazide was increased to 25 mg by mouth daily this last week.  I have advised her to continue clonidine 0.1 mg by mouth twice a day as well, and keep close follow-up with her PMD Dr. Moshe Cipro.  3) Lipids/HPL:  continue statins. 4)  Weight/Diet: CDE consult in progress, exercise, and carbohydrates information provided.  5) hypothyroidism:  her thyroid function tests are consistent with appropriate replacement. I will  continue Levothyroxine 88 g by mouth every morning.   - We discussed about correct intake of levothyroxine, at fasting, with water, separated by at least 30 minutes from breakfast, and separated by more than 4 hours from calcium, iron, multivitamins, acid reflux medications (PPIs). -Patient is made aware of the fact that thyroid hormone replacement is needed for life, dose to be adjusted by periodic monitoring of thyroid function tests.  6) Chronic Care/Health Maintenance:  -Patient is on ACEI/ARB and Statin medications and encouraged to continue to follow up with Ophthalmology, Podiatrist at least yearly or according to recommendations, and advised to  stay away from smoking. I have recommended yearly flu vaccine and pneumonia vaccination at least every 5 years; moderate intensity exercise for up to 150 minutes weekly; and  sleep for at least 7 hours a day.  - Time spent with the patient: 25 min, of which >50% was spent in reviewing her sugar logs , discussing her hypo- and hyper-glycemic episodes, reviewing her current and  previous labs and insulin doses and developing a plan to avoid hypo- and hyper-glycemia.    - I advised patient to maintain close follow up with [name], MD for primary care needs.  Follow up plan: -Return in about 3 months (around 04/27/2017) for follow up with pre-visit labs, meter,  and logs.  Glade Lloyd, MD Phone:  [phone]  Fax: [phone]  -  This note was partially dictated with voice recognition software. Similar sounding words can be transcribed inadequately or may not  be corrected upon review.  01/25/2017, 8:48 AM

## 2017-01-25 NOTE — Patient Instructions (Signed)

## 2017-01-26 DIAGNOSIS — M2560 Stiffness of unspecified joint, not elsewhere classified: Secondary | ICD-10-CM | POA: Diagnosis not present

## 2017-01-26 DIAGNOSIS — G8929 Other chronic pain: Secondary | ICD-10-CM | POA: Diagnosis not present

## 2017-01-26 DIAGNOSIS — R293 Abnormal posture: Secondary | ICD-10-CM | POA: Diagnosis not present

## 2017-01-26 DIAGNOSIS — M545 Low back pain: Secondary | ICD-10-CM | POA: Diagnosis not present

## 2017-01-28 DIAGNOSIS — R293 Abnormal posture: Secondary | ICD-10-CM | POA: Diagnosis not present

## 2017-01-28 DIAGNOSIS — M545 Low back pain: Secondary | ICD-10-CM | POA: Diagnosis not present

## 2017-01-28 DIAGNOSIS — M2560 Stiffness of unspecified joint, not elsewhere classified: Secondary | ICD-10-CM | POA: Diagnosis not present

## 2017-01-28 DIAGNOSIS — G8929 Other chronic pain: Secondary | ICD-10-CM | POA: Diagnosis not present

## 2017-02-08 ENCOUNTER — Telehealth: Payer: Self-pay | Admitting: Family Medicine

## 2017-02-08 DIAGNOSIS — R1010 Upper abdominal pain, unspecified: Secondary | ICD-10-CM

## 2017-02-08 DIAGNOSIS — R1013 Epigastric pain: Secondary | ICD-10-CM

## 2017-02-08 NOTE — Telephone Encounter (Signed)
Needs to be evaluaed by GI. I recommend a breath test as noton a pPI , then after she gets that , done  Recommend she start oTC Zantac, and I will refer her to gI doc of her choice. If pain becomes very severe , needs to go to the ED, or she starts vomiting black material, blood or passing black stool  Please ask and document whether she is having regular bM or if constipated and stomach swollen, pls send me back a message

## 2017-02-08 NOTE — Telephone Encounter (Signed)
Having the pain again in her upper abdomen like she did before. Not able to eat much because it starts hurting. No nausea or vomiting but wants to know what she can do about it. Please advise

## 2017-02-08 NOTE — Telephone Encounter (Signed)
Patient is having trouble with upper stomach pain again, lasting 1 week so far, she is requesting to talk to you. Cb#: [phone]

## 2017-02-09 ENCOUNTER — Other Ambulatory Visit: Payer: Self-pay | Admitting: Family Medicine

## 2017-02-09 ENCOUNTER — Encounter: Payer: Self-pay | Admitting: Internal Medicine

## 2017-02-09 NOTE — Telephone Encounter (Signed)
Has been taking omeprazole daily so will stop and have breath test in 2 weeks. Referred to [facility]

## 2017-02-10 NOTE — Telephone Encounter (Signed)
Noted and agree, thanks.

## 2017-02-11 DIAGNOSIS — G8929 Other chronic pain: Secondary | ICD-10-CM | POA: Diagnosis not present

## 2017-02-11 DIAGNOSIS — R293 Abnormal posture: Secondary | ICD-10-CM | POA: Diagnosis not present

## 2017-02-11 DIAGNOSIS — M545 Low back pain: Secondary | ICD-10-CM | POA: Diagnosis not present

## 2017-02-11 DIAGNOSIS — M2560 Stiffness of unspecified joint, not elsewhere classified: Secondary | ICD-10-CM | POA: Diagnosis not present

## 2017-02-14 DIAGNOSIS — G8929 Other chronic pain: Secondary | ICD-10-CM | POA: Diagnosis not present

## 2017-02-14 DIAGNOSIS — M545 Low back pain: Secondary | ICD-10-CM | POA: Diagnosis not present

## 2017-02-14 DIAGNOSIS — M2560 Stiffness of unspecified joint, not elsewhere classified: Secondary | ICD-10-CM | POA: Diagnosis not present

## 2017-02-14 DIAGNOSIS — R293 Abnormal posture: Secondary | ICD-10-CM | POA: Diagnosis not present

## 2017-02-16 ENCOUNTER — Other Ambulatory Visit: Payer: Self-pay | Admitting: "Endocrinology

## 2017-02-16 ENCOUNTER — Other Ambulatory Visit: Payer: Self-pay | Admitting: Family Medicine

## 2017-02-21 DIAGNOSIS — M545 Low back pain: Secondary | ICD-10-CM | POA: Diagnosis not present

## 2017-02-21 DIAGNOSIS — R293 Abnormal posture: Secondary | ICD-10-CM | POA: Diagnosis not present

## 2017-02-21 DIAGNOSIS — M2560 Stiffness of unspecified joint, not elsewhere classified: Secondary | ICD-10-CM | POA: Diagnosis not present

## 2017-02-21 DIAGNOSIS — G8929 Other chronic pain: Secondary | ICD-10-CM | POA: Diagnosis not present

## 2017-02-22 ENCOUNTER — Other Ambulatory Visit: Payer: Self-pay | Admitting: *Deleted

## 2017-02-22 ENCOUNTER — Other Ambulatory Visit: Payer: Self-pay | Admitting: "Endocrinology

## 2017-02-22 ENCOUNTER — Other Ambulatory Visit: Payer: Self-pay | Admitting: Family Medicine

## 2017-02-22 ENCOUNTER — Encounter: Payer: Self-pay | Admitting: Internal Medicine

## 2017-02-22 ENCOUNTER — Ambulatory Visit (INDEPENDENT_AMBULATORY_CARE_PROVIDER_SITE_OTHER): Payer: Medicare HMO | Admitting: Internal Medicine

## 2017-02-22 ENCOUNTER — Encounter: Payer: Self-pay | Admitting: *Deleted

## 2017-02-22 VITALS — BP 195/94 | HR 90 | Temp 98.1°F | Ht 61.5 in | Wt 165.2 lb

## 2017-02-22 DIAGNOSIS — K219 Gastro-esophageal reflux disease without esophagitis: Secondary | ICD-10-CM

## 2017-02-22 DIAGNOSIS — R1013 Epigastric pain: Secondary | ICD-10-CM

## 2017-02-22 NOTE — Progress Notes (Signed)
Primary Care Physician:  [name], MD Primary Gastroenterologist:  Dr. Gala Romney  Pre-Procedure History & Physical: HPI:  Christine Dominguez is a 66 y.o. female here for further evaluation of upper abdominal discomfort. Patient states for years she's had upper abdominal fullness burning some regurgitation reflux symptoms chronically. Has been on omeprazole for some time as she reports. Denies odynophagia or dysphagia. Denies early satiety. Denies weight loss. Has long-standing diabetes mellitus.  No prior upper GI tract evaluation. History of hepatic hemangiomas stable on prior ultrasound and CT. Gallbladder remains in situ. Colonoscopy by me back in 2012 demonstrated only diverticulosis.  Patient's symptoms have worsened since stopping omeprazole in preparation for an H pylori breath test which was ordered by Dr. Moshe Cipro.  Past Medical History:  Diagnosis Date  . Anemia   . Arthritis    Cervical spine  . 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 Chronic back pain 2007   Disabled   . Essential hypertension, benign 1980  . GERD (gastroesophageal reflux disease) 2000  . Hydrocephalus    Treated with VP shunt and then intracranial tumor resection (?Cholesteatoma); no longer followed actively by neurosurgery or otolaryngology  . Hyperlipidemia 2013  . Hypothyroidism 2013  . Neuropathy   . Obesity   . Type 2 diabetes mellitus ([place]) 1991    Past Surgical History:  Procedure Laterality Date  . CATARACT EXTRACTION W/PHACO Right 07/17/2013   Procedure: CATARACT EXTRACTION PHACO AND INTRAOCULAR LENS PLACEMENT (IOC);  Surgeon: [name], MD;  Location: [facility];  Service: Ophthalmology;  Laterality: Right;  CDE:7.24  . COLONOSCOPY  06/2010  . Hemorrhoidectomy    . Intracranial mass resected  1998   At [facility]; ? location-right middle ear  . Teeth pulled    . TRIGGER FINGER RELEASE     Rght thumb  . TUBAL LIGATION   1980  . TUBAL LIGATION    . VENTRICULOPERITONEAL [place]    Prior to Admission medications   Medication Sig Start Date End Date Taking? Authorizing Provider  ACCU-CHEK SMARTVIEW test strip TEST BLOOD SUGAR UP TO 4 TIMES DAILY. 01/05/17   Cassandria Anger, MD  Alcohol Swabs (B-D SINGLE USE SWABS REGULAR) PADS TEST FOUR TIMES A DAY 08/12/16   Nida, Marella Chimes, MD  amLODipine (NORVASC) 5 MG tablet Take 5 mg by mouth. 12/17/15   [provider]  aspirin EC 81 MG tablet Take 81 mg by mouth at bedtime.    [provider]  B-D ULTRAFINE III SHORT PEN 31G X 8 MM MISC USE  TO INJECT EVERY DAY  WITH  LANTUS 11/18/16   Cassandria Anger, MD  cloNIDine (CATAPRES) 0.1 MG tablet 1.5 tabs twice daily    [provider]  docusate sodium (STOOL SOFTENER) 100 MG capsule Take 100 mg by mouth as needed for mild constipation.    [provider]  ferrous sulfate 325 (65 FE) MG EC tablet Take 325 mg by mouth daily.    [provider]  gabapentin (NEURONTIN) 100 MG capsule Take 1 capsule (100 mg total) by mouth 2 (two) times daily. 11/17/16   [name], MD  glucose [facility] GLUCOSE) 4 GM chewable tablet Chew by mouth as needed.  07/29/11   [provider]  hydrochlorothiazide (HYDRODIURIL) 25 MG tablet TAKE 1 TABLET EVERY DAY  (INCREASED DOSE 06/09/2016) 02/22/17   [name], MD  LANTUS  SOLOSTAR 100 UNIT/ML Solostar Pen INJECT 50 UNITS INTO THE SKIN AT BEDTIME 01/05/17   Cassandria Anger, MD  levothyroxine (SYNTHROID, LEVOTHROID) 88 MCG tablet TAKE 1 TABLET EVERY DAY BEFORE BREAKFAST 02/17/17   Cassandria Anger, MD  nystatin (MYCOSTATIN/NYSTOP) powder APPLY TOPICALLY TO RASH ON LOWER ABDOMEN AT BEDTIME AS NEEDED 01/19/17   [name], MD  olmesartan (BENICAR) 40 MG tablet Take 1 tablet (40 mg total) by mouth daily. 11/24/16   [name], MD  omeprazole (PRILOSEC) 40 MG capsule TAKE 1 CAPSULE EVERY  DAY 11/18/16   [name], MD  potassium chloride (K-DUR) 10 MEQ tablet Take 1 tablet (10 mEq total) by mouth 2 (two) times daily. 06/09/16   [name], MD  Simethicone (GAS RELIEF DROPS PO) Take by mouth as needed.    [provider]  simvastatin (ZOCOR) 10 MG tablet TAKE 1 TABLET AT BEDTIME 02/09/17   [name], MD  sodium chloride (OCEAN) 0.65 % nasal spray 1 spray as needed.  12/04/13   [provider]  TRADJENTA 5 MG TABS tablet TAKE 1 TABLET EVERY DAY 01/26/17   Cassandria Anger, MD  Vitamin D, Ergocalciferol, (DRISDOL) 50000 units CAPS capsule TAKE 1 CAPSULE EVERY 7 DAYS 02/22/17   [name], MD    Allergies as of 02/22/2017 - Review Complete 01/25/2017  Allergen Reaction Noted  . Amlodipine Swelling 07/07/2016  . Amaryl  11/24/2010  . Avandia [rosiglitazone maleate]  10/13/2009  . Glimepiride Itching 10/13/2009  . Glipizide Itching 10/13/2009  . Invokana [canagliflozin]  08/01/2013  . Lyrica [pregabalin] Nausea And Vomiting 06/07/2015  . Novolog [insulin aspart]  02/01/2013  . Rosiglitazone  01/03/2015  . Sitagliptin  01/03/2015  . Aspirin Nausea Only and Other (See Comments) 11/24/2010    Family History  Problem Relation Age of Onset  . Lung cancer Mother   . Hypertension Mother 68  . Hypertension Brother   . Diabetes Brother   . Diabetes Sister   . Kidney failure Brother   . Hypertension Maternal Grandmother   . Heart disease Maternal Grandmother   . Other Son        stillborn  . Other Son        died at birth  . Diabetes Maternal Grandfather   . Other Son        was murdered    Social History   Socioeconomic History  . Marital status: Widowed    Spouse name: Not on file  . Number of children: 3  . Years of education: Not on file  . Highest education level: Not on file  Social Needs  . Financial resource strain: Not on file  . Food insecurity - worry: Not on file  . Food insecurity - inability: Not  on file  . Transportation needs - medical: Not on file  . Transportation needs - non-medical: Not on file  Occupational History  . Occupation: disabled   Tobacco Use  . Smoking status: Former Smoker    Packs/day: 0.50    Years: 15.00    Pack years: 7.50    Types: Cigarettes    Last attempt to quit: 07/07/2001    Years since quitting: 15.6  . Smokeless tobacco: Never Used  Substance and Sexual Activity  . Alcohol use: No    Alcohol/week: 0.0 oz  . Drug use: No  . Sexual activity: No    Birth control/protection: Surgical    Comment: tubal  Other Topics Concern  .  Not on file  Social History Narrative   Mother of 3 - all children are deceased          Review of Systems: See HPI, otherwise negative ROS  Physical Exam: There were no vitals taken for this visit. General:   Alert,  , pleasant and cooperative in NAD. Has she has disconjugate gaze. Neck:  Supple; no masses or thyromegaly. No significant cervical adenopathy. Lungs:  Clear throughout to auscultation.   No wheezes, crackles, or rhonchi. No acute distress. Heart:  Regular rate and rhythm; no murmurs, clicks, rubs,  or gallops. Abdomen: nondistended. Positive bowel sounds. She does have some epigastric tenderness to palpation. No appreciable mass or organomegaly. Pulses:  Normal pulses noted. Extremities:  Without clubbing or edema. Rectal: Good sphincter tone no masses in rectal vault scant dark stool was Hemoccult negative.  Impression: This is a very nice 66 year old lady with long-standing symptoms consistent with dyspepsia and possibly reflux dependent on PPI therapy. She does not have any alarm features at this time. Her symptoms, however, or long-standing. She has never had her upper GI tract evaluated. H pylori is being checked by Dr. Moshe Cipro who has appropriately held her acid suppression therapy and preparation for testing.  Recommendations:  In parallel with preparation for H. Pylori breath testing, I will go  ahead and offer the patient diagnostic EGD to further evaluate her long-standing symptoms.  The risks, benefits, limitations, alternatives and imponderables have been reviewed with the patient. Potential for esophageal dilation, biopsy, etc. have also been reviewed.  Questions have been answered. All parties agreeable.  Further recommendations to follow.        Notice: This dictation was prepared with Dragon dictation along with smaller phrase technology. Any transcriptional errors that result from this process are unintentional and may not be corrected upon review.

## 2017-02-22 NOTE — Patient Instructions (Addendum)
Schedule diagnostic EGD - Chronic dyspepsia /  GERD - conscious sedation  Need a 0730 appointment  Do not resume taking omeprazole until after EGD  Further recommendations to follow

## 2017-02-23 DIAGNOSIS — R293 Abnormal posture: Secondary | ICD-10-CM | POA: Diagnosis not present

## 2017-02-23 DIAGNOSIS — M2560 Stiffness of unspecified joint, not elsewhere classified: Secondary | ICD-10-CM | POA: Diagnosis not present

## 2017-02-23 DIAGNOSIS — G8929 Other chronic pain: Secondary | ICD-10-CM | POA: Diagnosis not present

## 2017-02-23 DIAGNOSIS — M545 Low back pain: Secondary | ICD-10-CM | POA: Diagnosis not present

## 2017-02-23 DIAGNOSIS — H02824 Cysts of left upper eyelid: Secondary | ICD-10-CM | POA: Diagnosis not present

## 2017-02-28 ENCOUNTER — Other Ambulatory Visit: Payer: Self-pay

## 2017-02-28 ENCOUNTER — Telehealth: Payer: Self-pay | Admitting: *Deleted

## 2017-02-28 ENCOUNTER — Telehealth: Payer: Self-pay | Admitting: Family Medicine

## 2017-02-28 DIAGNOSIS — M2560 Stiffness of unspecified joint, not elsewhere classified: Secondary | ICD-10-CM | POA: Diagnosis not present

## 2017-02-28 DIAGNOSIS — R293 Abnormal posture: Secondary | ICD-10-CM | POA: Diagnosis not present

## 2017-02-28 DIAGNOSIS — M545 Low back pain: Secondary | ICD-10-CM | POA: Diagnosis not present

## 2017-02-28 DIAGNOSIS — G8929 Other chronic pain: Secondary | ICD-10-CM | POA: Diagnosis not present

## 2017-02-28 MED ORDER — POTASSIUM CHLORIDE ER 10 MEQ PO TBCR: 10.0000 meq | EXTENDED_RELEASE_TABLET | Freq: Two times a day (BID) | ORAL | 3 refills | Status: DC

## 2017-02-28 NOTE — Telephone Encounter (Signed)
Patient called stating she is supposed to do a test or labs for Dr Moshe Cipro. Please advise patient

## 2017-02-28 NOTE — Telephone Encounter (Signed)
Will go tomorrow for breath test

## 2017-02-28 NOTE — Telephone Encounter (Signed)
Patient left a voicemail  requesting potassium be sent in to her pharmacy.

## 2017-02-28 NOTE — Telephone Encounter (Signed)
Med sent.

## 2017-03-01 DIAGNOSIS — R1013 Epigastric pain: Secondary | ICD-10-CM | POA: Diagnosis not present

## 2017-03-02 ENCOUNTER — Other Ambulatory Visit: Payer: Self-pay | Admitting: "Endocrinology

## 2017-03-02 ENCOUNTER — Other Ambulatory Visit: Payer: Self-pay

## 2017-03-02 LAB — H. PYLORI BREATH TEST: H. pylori Breath Test: NOT DETECTED

## 2017-03-02 MED ORDER — GLUCOSE BLOOD VI STRP: 1.0000 | ORAL_STRIP | 2 refills | Status: DC

## 2017-03-03 ENCOUNTER — Telehealth: Payer: Self-pay

## 2017-03-03 NOTE — Telephone Encounter (Signed)
Pt called office to cancel EGD with RMR for 04/01/17. She stated she had the breath test done and it was negative. She feels that she doesn't need procedure.   Routing to RMR as FYI.

## 2017-03-03 NOTE — Telephone Encounter (Signed)
Noted; her decision

## 2017-03-04 ENCOUNTER — Telehealth: Payer: Self-pay | Admitting: Family Medicine

## 2017-03-04 DIAGNOSIS — G8929 Other chronic pain: Secondary | ICD-10-CM | POA: Diagnosis not present

## 2017-03-04 DIAGNOSIS — R293 Abnormal posture: Secondary | ICD-10-CM | POA: Diagnosis not present

## 2017-03-04 DIAGNOSIS — M2560 Stiffness of unspecified joint, not elsewhere classified: Secondary | ICD-10-CM | POA: Diagnosis not present

## 2017-03-04 DIAGNOSIS — M545 Low back pain: Secondary | ICD-10-CM | POA: Diagnosis not present

## 2017-03-04 NOTE — Telephone Encounter (Signed)
Patient left a voicemail to discuss a script for diabetic shoes.  [facility] is where she gets them from at no cost.

## 2017-03-04 NOTE — Telephone Encounter (Signed)
Completed and will fax back once its signed by Dr

## 2017-03-07 ENCOUNTER — Other Ambulatory Visit: Payer: Self-pay

## 2017-03-07 DIAGNOSIS — R293 Abnormal posture: Secondary | ICD-10-CM | POA: Diagnosis not present

## 2017-03-07 DIAGNOSIS — G8929 Other chronic pain: Secondary | ICD-10-CM | POA: Diagnosis not present

## 2017-03-07 DIAGNOSIS — M2560 Stiffness of unspecified joint, not elsewhere classified: Secondary | ICD-10-CM | POA: Diagnosis not present

## 2017-03-07 DIAGNOSIS — M545 Low back pain: Secondary | ICD-10-CM | POA: Diagnosis not present

## 2017-03-07 MED ORDER — GLUCOSE BLOOD VI STRP: ORAL_STRIP | 1 refills | Status: DC

## 2017-03-09 ENCOUNTER — Telehealth: Payer: Self-pay | Admitting: Family Medicine

## 2017-03-09 ENCOUNTER — Other Ambulatory Visit: Payer: Self-pay

## 2017-03-09 ENCOUNTER — Encounter: Payer: Self-pay | Admitting: Family Medicine

## 2017-03-09 DIAGNOSIS — R293 Abnormal posture: Secondary | ICD-10-CM | POA: Diagnosis not present

## 2017-03-09 DIAGNOSIS — M545 Low back pain: Secondary | ICD-10-CM | POA: Diagnosis not present

## 2017-03-09 DIAGNOSIS — G8929 Other chronic pain: Secondary | ICD-10-CM | POA: Diagnosis not present

## 2017-03-09 DIAGNOSIS — M2560 Stiffness of unspecified joint, not elsewhere classified: Secondary | ICD-10-CM | POA: Diagnosis not present

## 2017-03-09 MED ORDER — CLONIDINE HCL 0.1 MG PO TABS: ORAL_TABLET | ORAL | 5 refills | Status: DC

## 2017-03-09 NOTE — Telephone Encounter (Signed)
Patient is requesting clonidine refill  [facility] Cb#: [phone]

## 2017-03-09 NOTE — Telephone Encounter (Signed)
Med sent.

## 2017-03-15 ENCOUNTER — Other Ambulatory Visit: Payer: Self-pay | Admitting: "Endocrinology

## 2017-03-16 ENCOUNTER — Other Ambulatory Visit: Payer: Self-pay | Admitting: "Endocrinology

## 2017-03-18 ENCOUNTER — Other Ambulatory Visit: Payer: Self-pay

## 2017-03-18 MED ORDER — GLUCOSE BLOOD VI STRP: ORAL_STRIP | 2 refills | Status: AC

## 2017-03-18 MED ORDER — INSULIN GLARGINE 100 UNIT/ML SOLOSTAR PEN: PEN_INJECTOR | SUBCUTANEOUS | 0 refills | Status: DC

## 2017-03-21 DIAGNOSIS — L84 Corns and callosities: Secondary | ICD-10-CM | POA: Diagnosis not present

## 2017-03-21 DIAGNOSIS — E119 Type 2 diabetes mellitus without complications: Secondary | ICD-10-CM | POA: Diagnosis not present

## 2017-03-21 DIAGNOSIS — M7671 Peroneal tendinitis, right leg: Secondary | ICD-10-CM | POA: Diagnosis not present

## 2017-03-21 DIAGNOSIS — B351 Tinea unguium: Secondary | ICD-10-CM | POA: Diagnosis not present

## 2017-03-23 DIAGNOSIS — R293 Abnormal posture: Secondary | ICD-10-CM | POA: Diagnosis not present

## 2017-03-23 DIAGNOSIS — M545 Low back pain: Secondary | ICD-10-CM | POA: Diagnosis not present

## 2017-03-23 DIAGNOSIS — G8929 Other chronic pain: Secondary | ICD-10-CM | POA: Diagnosis not present

## 2017-03-23 DIAGNOSIS — M2560 Stiffness of unspecified joint, not elsewhere classified: Secondary | ICD-10-CM | POA: Diagnosis not present

## 2017-03-25 DIAGNOSIS — M2560 Stiffness of unspecified joint, not elsewhere classified: Secondary | ICD-10-CM | POA: Diagnosis not present

## 2017-03-25 DIAGNOSIS — R293 Abnormal posture: Secondary | ICD-10-CM | POA: Diagnosis not present

## 2017-03-25 DIAGNOSIS — M545 Low back pain: Secondary | ICD-10-CM | POA: Diagnosis not present

## 2017-03-25 DIAGNOSIS — G8929 Other chronic pain: Secondary | ICD-10-CM | POA: Diagnosis not present

## 2017-03-28 DIAGNOSIS — G8929 Other chronic pain: Secondary | ICD-10-CM | POA: Diagnosis not present

## 2017-03-28 DIAGNOSIS — M2560 Stiffness of unspecified joint, not elsewhere classified: Secondary | ICD-10-CM | POA: Diagnosis not present

## 2017-03-28 DIAGNOSIS — M545 Low back pain: Secondary | ICD-10-CM | POA: Diagnosis not present

## 2017-03-28 DIAGNOSIS — R293 Abnormal posture: Secondary | ICD-10-CM | POA: Diagnosis not present

## 2017-03-30 ENCOUNTER — Telehealth: Payer: Self-pay | Admitting: Family Medicine

## 2017-03-30 DIAGNOSIS — M545 Low back pain: Secondary | ICD-10-CM | POA: Diagnosis not present

## 2017-03-30 DIAGNOSIS — G8929 Other chronic pain: Secondary | ICD-10-CM | POA: Diagnosis not present

## 2017-03-30 DIAGNOSIS — R293 Abnormal posture: Secondary | ICD-10-CM | POA: Diagnosis not present

## 2017-03-30 DIAGNOSIS — M2560 Stiffness of unspecified joint, not elsewhere classified: Secondary | ICD-10-CM | POA: Diagnosis not present

## 2017-03-30 NOTE — Telephone Encounter (Signed)
Kathlee Nations from Physical therapy is calling, states that she faxed Dr Moshe Cipro a form to complete and send back and she has not received the for.

## 2017-03-30 NOTE — Telephone Encounter (Signed)
I have not received anything for her but [facility] orders which have been sent back. If Kathlee Nations calls back, ask her to refax.

## 2017-04-01 ENCOUNTER — Ambulatory Visit (HOSPITAL_COMMUNITY): Admit: 2017-04-01 | Payer: Medicare HMO | Admitting: Internal Medicine

## 2017-04-01 ENCOUNTER — Encounter (HOSPITAL_COMMUNITY): Payer: Self-pay

## 2017-04-01 SURGERY — EGD (ESOPHAGOGASTRODUODENOSCOPY)
Anesthesia: Moderate Sedation

## 2017-04-04 DIAGNOSIS — M2560 Stiffness of unspecified joint, not elsewhere classified: Secondary | ICD-10-CM | POA: Diagnosis not present

## 2017-04-04 DIAGNOSIS — R293 Abnormal posture: Secondary | ICD-10-CM | POA: Diagnosis not present

## 2017-04-04 DIAGNOSIS — G8929 Other chronic pain: Secondary | ICD-10-CM | POA: Diagnosis not present

## 2017-04-04 DIAGNOSIS — M545 Low back pain: Secondary | ICD-10-CM | POA: Diagnosis not present

## 2017-04-06 DIAGNOSIS — R293 Abnormal posture: Secondary | ICD-10-CM | POA: Diagnosis not present

## 2017-04-06 DIAGNOSIS — G8929 Other chronic pain: Secondary | ICD-10-CM | POA: Diagnosis not present

## 2017-04-06 DIAGNOSIS — M545 Low back pain: Secondary | ICD-10-CM | POA: Diagnosis not present

## 2017-04-06 DIAGNOSIS — M2560 Stiffness of unspecified joint, not elsewhere classified: Secondary | ICD-10-CM | POA: Diagnosis not present

## 2017-04-11 ENCOUNTER — Telehealth: Payer: Self-pay | Admitting: Family Medicine

## 2017-04-13 ENCOUNTER — Ambulatory Visit (INDEPENDENT_AMBULATORY_CARE_PROVIDER_SITE_OTHER): Payer: Medicare HMO | Admitting: Family Medicine

## 2017-04-13 ENCOUNTER — Encounter: Payer: Self-pay | Admitting: Family Medicine

## 2017-04-13 VITALS — BP 180/80 | HR 86 | Resp 16 | Ht 61.5 in | Wt 165.0 lb

## 2017-04-13 DIAGNOSIS — E1122 Type 2 diabetes mellitus with diabetic chronic kidney disease: Secondary | ICD-10-CM

## 2017-04-13 DIAGNOSIS — N183 Chronic kidney disease, stage 3 (moderate): Secondary | ICD-10-CM | POA: Diagnosis not present

## 2017-04-13 DIAGNOSIS — G8929 Other chronic pain: Secondary | ICD-10-CM | POA: Diagnosis not present

## 2017-04-13 DIAGNOSIS — I10 Essential (primary) hypertension: Secondary | ICD-10-CM

## 2017-04-13 DIAGNOSIS — M545 Low back pain: Secondary | ICD-10-CM | POA: Diagnosis not present

## 2017-04-13 DIAGNOSIS — M2560 Stiffness of unspecified joint, not elsewhere classified: Secondary | ICD-10-CM | POA: Diagnosis not present

## 2017-04-13 DIAGNOSIS — E782 Mixed hyperlipidemia: Secondary | ICD-10-CM

## 2017-04-13 DIAGNOSIS — R293 Abnormal posture: Secondary | ICD-10-CM | POA: Diagnosis not present

## 2017-04-13 DIAGNOSIS — M5440 Lumbago with sciatica, unspecified side: Secondary | ICD-10-CM | POA: Diagnosis not present

## 2017-04-13 MED ORDER — CLONIDINE HCL 0.1 MG PO TABS: ORAL_TABLET | ORAL | 3 refills | Status: DC

## 2017-04-13 NOTE — Patient Instructions (Addendum)
F/u in 3 months, call if you need me before, please keep appt with Cardiology in February  Increase clonidine to three times daily, 1.5  Tablets twice daily , then 2 at night, take at 6 am , 2 pm and 10 pm     Continue Benicar 40 mg , HCTZ 25 mg daily for blood pressure   All the best for 2019!  It is important that you exercise regularly at least 30 minutes 5 times a week. If you develop chest pain, have severe difficulty breathing, or feel very tired, stop exercising immediately and seek medical attention

## 2017-04-15 DIAGNOSIS — M2560 Stiffness of unspecified joint, not elsewhere classified: Secondary | ICD-10-CM | POA: Diagnosis not present

## 2017-04-15 DIAGNOSIS — M545 Low back pain: Secondary | ICD-10-CM | POA: Diagnosis not present

## 2017-04-15 DIAGNOSIS — R293 Abnormal posture: Secondary | ICD-10-CM | POA: Diagnosis not present

## 2017-04-15 DIAGNOSIS — G8929 Other chronic pain: Secondary | ICD-10-CM | POA: Diagnosis not present

## 2017-04-20 DIAGNOSIS — G8929 Other chronic pain: Secondary | ICD-10-CM | POA: Diagnosis not present

## 2017-04-20 DIAGNOSIS — E1122 Type 2 diabetes mellitus with diabetic chronic kidney disease: Secondary | ICD-10-CM | POA: Diagnosis not present

## 2017-04-20 DIAGNOSIS — M2560 Stiffness of unspecified joint, not elsewhere classified: Secondary | ICD-10-CM | POA: Diagnosis not present

## 2017-04-20 DIAGNOSIS — N183 Chronic kidney disease, stage 3 (moderate): Secondary | ICD-10-CM | POA: Diagnosis not present

## 2017-04-20 DIAGNOSIS — R293 Abnormal posture: Secondary | ICD-10-CM | POA: Diagnosis not present

## 2017-04-20 DIAGNOSIS — M545 Low back pain: Secondary | ICD-10-CM | POA: Diagnosis not present

## 2017-04-21 LAB — COMPLETE METABOLIC PANEL WITH GFR
AG Ratio: 1.2 (calc) (ref 1.0–2.5)
ALBUMIN MSPROF: 4.1 g/dL (ref 3.6–5.1)
ALT: 14 U/L (ref 6–29)
AST: 14 U/L (ref 10–35)
Alkaline phosphatase (APISO): 71 U/L (ref 33–130)
BILIRUBIN TOTAL: 0.2 mg/dL (ref 0.2–1.2)
BUN / CREAT RATIO: 16 (calc) (ref 6–22)
BUN: 22 mg/dL (ref 7–25)
CHLORIDE: 103 mmol/L (ref 98–110)
CO2: 26 mmol/L (ref 20–32)
CREATININE: 1.37 mg/dL — AB (ref 0.50–0.99)
Calcium: 9.9 mg/dL (ref 8.6–10.4)
GFR, EST AFRICAN AMERICAN: 47 mL/min/{1.73_m2} — AB (ref >=60)
GFR, EST NON AFRICAN AMERICAN: 40 mL/min/{1.73_m2} — AB (ref >=60)
GLUCOSE: 163 mg/dL — AB (ref 65–139)
Globulin: 3.5 g/dL (calc) (ref 1.9–3.7)
Potassium: 4.4 mmol/L (ref 3.5–5.3)
Sodium: 137 mmol/L (ref 135–146)
TOTAL PROTEIN: 7.6 g/dL (ref 6.1–8.1)

## 2017-04-21 LAB — HEMOGLOBIN A1C
Hgb A1c MFr Bld: 9 % of total Hgb — ABNORMAL HIGH (ref <5.7)
Mean Plasma Glucose: 212 (calc)
eAG (mmol/L): 11.7 (calc)

## 2017-04-21 LAB — VITAMIN D 25 HYDROXY (VIT D DEFICIENCY, FRACTURES): Vit D, 25-Hydroxy: 57 ng/mL (ref 30–100)

## 2017-04-22 ENCOUNTER — Ambulatory Visit: Payer: Medicare HMO | Admitting: Cardiology

## 2017-04-22 DIAGNOSIS — M545 Low back pain: Secondary | ICD-10-CM | POA: Diagnosis not present

## 2017-04-22 DIAGNOSIS — G8929 Other chronic pain: Secondary | ICD-10-CM | POA: Diagnosis not present

## 2017-04-22 DIAGNOSIS — M2560 Stiffness of unspecified joint, not elsewhere classified: Secondary | ICD-10-CM | POA: Diagnosis not present

## 2017-04-22 DIAGNOSIS — R293 Abnormal posture: Secondary | ICD-10-CM | POA: Diagnosis not present

## 2017-04-26 ENCOUNTER — Telehealth: Payer: Self-pay | Admitting: Family Medicine

## 2017-04-26 NOTE — Telephone Encounter (Signed)
Kathlee Nations, [facility], left message on nurse lie asking for progress note dated 04/20/17 and plan of care dated 03/23/17 be faxed back to [phone]; or give he a call if you need a new copy at [phone]

## 2017-04-27 NOTE — Telephone Encounter (Signed)
I'm not understanding what they are needing. Can you confirm?

## 2017-04-28 ENCOUNTER — Ambulatory Visit (INDEPENDENT_AMBULATORY_CARE_PROVIDER_SITE_OTHER): Payer: Medicare HMO | Admitting: "Endocrinology

## 2017-04-28 ENCOUNTER — Encounter: Payer: Self-pay | Admitting: "Endocrinology

## 2017-04-28 VITALS — BP 180/93 | HR 93 | Ht 61.5 in | Wt 168.0 lb

## 2017-04-28 DIAGNOSIS — E039 Hypothyroidism, unspecified: Secondary | ICD-10-CM | POA: Diagnosis not present

## 2017-04-28 DIAGNOSIS — E782 Mixed hyperlipidemia: Secondary | ICD-10-CM

## 2017-04-28 DIAGNOSIS — N183 Chronic kidney disease, stage 3 unspecified: Secondary | ICD-10-CM

## 2017-04-28 DIAGNOSIS — E1122 Type 2 diabetes mellitus with diabetic chronic kidney disease: Secondary | ICD-10-CM | POA: Diagnosis not present

## 2017-04-28 DIAGNOSIS — I10 Essential (primary) hypertension: Secondary | ICD-10-CM

## 2017-04-28 MED ORDER — INSULIN GLARGINE 100 UNIT/ML SOLOSTAR PEN: PEN_INJECTOR | SUBCUTANEOUS | 0 refills | Status: DC

## 2017-04-28 NOTE — Progress Notes (Signed)
Subjective:    Patient ID: Christine Dominguez, female    DOB: 1950/11/18, PCP [name], MD   Past Medical History:  Diagnosis Date  . Anemia   . Arthritis    Cervical spine  . Cancer ([place]) 1998, recurred in 2012   Paraganglioma of jugular tympamicum, recurrent in 2012  . Chronic back pain 2007   Disabled   . Essential hypertension, benign 1980  . GERD (gastroesophageal reflux disease) 2000  . Hydrocephalus    Treated with VP shunt and then intracranial tumor resection (?Cholesteatoma); no longer followed actively by neurosurgery or otolaryngology  . Hyperlipidemia 2013  . Hypothyroidism 2013  . Neuropathy   . Obesity   . Type 2 diabetes mellitus ([place]) 1991   Past Surgical History:  Procedure Laterality Date  . CATARACT EXTRACTION W/PHACO Right 07/17/2013   Procedure: CATARACT EXTRACTION PHACO AND INTRAOCULAR LENS PLACEMENT (IOC);  Surgeon: [name], MD;  Location: [facility];  Service: Ophthalmology;  Laterality: Right;  CDE:7.24  . COLONOSCOPY  06/2010  . Hemorrhoidectomy    . Intracranial mass resected  1998   At [facility]; ? location-right middle ear  . Teeth pulled    . TRIGGER FINGER RELEASE     Rght thumb  . TUBAL LIGATION  1980  . TUBAL LIGATION    . VENTRICULOPERITONEAL SHUNT  1998   At [place] History   Socioeconomic History  . Marital status: Widowed    Spouse name: None  . Number of children: 3  . Years of education: None  . Highest education level: None  Social Needs  . Financial resource strain: None  . Food insecurity - worry: None  . Food insecurity - inability: None  . Transportation needs - medical: None  . Transportation needs - non-medical: None  Occupational History  . Occupation: disabled   Tobacco Use  . Smoking status: Former Smoker    Packs/day: 0.50    Years: 15.00    Pack years: 7.50    Types: Cigarettes    Last attempt to quit: 07/07/2001    Years since quitting: 15.8  . Smokeless tobacco: Never Used   Substance and Sexual Activity  . Alcohol use: No    Alcohol/week: 0.0 oz  . Drug use: No  . Sexual activity: No    Birth control/protection: Surgical    Comment: tubal  Other Topics Concern  . None  Social History Narrative   Mother of 3 - all children are deceased         Outpatient Encounter Medications as of 04/28/2017  Medication Sig  . ACCU-CHEK FASTCLIX LANCETS MISC TEST BLOOD SUGAR FOUR TIMES DAILY  . Alcohol Swabs (B-D SINGLE USE SWABS REGULAR) PADS TEST FOUR TIMES A DAY  . aspirin EC 81 MG tablet Take 81 mg by mouth at bedtime.  . B-D ULTRAFINE III SHORT PEN 31G X 8 MM MISC USE  TO INJECT EVERY DAY  WITH  LANTUS  . cloNIDine (CATAPRES) 0.1 MG tablet One and a half tablets at 6 am, then at 2 pm ,and two tablets at 10 pm  . docusate sodium (STOOL SOFTENER) 100 MG capsule Take 100 mg by mouth as needed for mild constipation.  . ferrous sulfate 325 (65 FE) MG EC tablet Take 325 mg by mouth daily.  Marland Kitchen gabapentin (NEURONTIN) 100 MG capsule Take 1 capsule (100 mg total) by mouth 2 (two) times daily.  Marland Kitchen glucose (SUNMARK GLUCOSE) 4 GM chewable tablet Chew  by mouth as needed.   Marland Kitchen glucose blood (ACCU-CHEK SMARTVIEW) test strip TEST 4 TIMES DAILY  . hydrochlorothiazide (HYDRODIURIL) 25 MG tablet TAKE 1 TABLET EVERY DAY  (INCREASED DOSE 06/09/2016)  . Insulin Glargine (LANTUS SOLOSTAR) 100 UNIT/ML Solostar Pen INJECT 70 UNITS INTO THE SKIN AT BEDTIME  . levothyroxine (SYNTHROID, LEVOTHROID) 88 MCG tablet TAKE 1 TABLET EVERY DAY BEFORE BREAKFAST  . nystatin (MYCOSTATIN/NYSTOP) powder APPLY TOPICALLY TO RASH ON LOWER ABDOMEN AT BEDTIME AS NEEDED  . olmesartan (BENICAR) 40 MG tablet Take 1 tablet (40 mg total) by mouth daily.  . potassium chloride (K-DUR) 10 MEQ tablet Take 1 tablet (10 mEq total) by mouth 2 (two) times daily.  . Simethicone (GAS RELIEF DROPS PO) Take by mouth as needed.  . simvastatin (ZOCOR) 10 MG tablet TAKE 1 TABLET AT BEDTIME  . sodium chloride (OCEAN) 0.65 % nasal  spray 1 spray as needed.   . TRADJENTA 5 MG TABS tablet TAKE 1 TABLET EVERY DAY  . [DISCONTINUED] Insulin Glargine (LANTUS SOLOSTAR) 100 UNIT/ML Solostar Pen INJECT 60 UNITS INTO THE SKIN AT BEDTIME  . [DISCONTINUED] omeprazole (PRILOSEC) 40 MG capsule TAKE 1 CAPSULE EVERY DAY (Patient not taking: Reported on 02/22/2017)  . [DISCONTINUED] Vitamin D, Ergocalciferol, (DRISDOL) 50000 units CAPS capsule TAKE 1 CAPSULE EVERY 7 DAYS   No facility-administered encounter medications on file as of 04/28/2017.    ALLERGIES: Allergies  Allergen Reactions  . Amlodipine Swelling    Itching and leg swelling  . Amaryl   . Avandia [Rosiglitazone Maleate]   . Glimepiride Itching  . Glipizide Itching  . Invokana [Canagliflozin]   . Lyrica [Pregabalin] Nausea And Vomiting  . Novolog [Insulin Aspart]   . Rosiglitazone     Other reaction(s): OTHER  . Sitagliptin     Other reaction(s): OTHER  . Aspirin Nausea Only and Other (See Comments)    Other Reaction: GI Upset; CAN TAKE 81 mg More of a burning feeling when she take it Burning in stomach, nausea   VACCINATION STATUS: Immunization History  Administered Date(s) Administered  . Influenza Split 02/15/2012  . Influenza Whole 01/06/2010  . Influenza,inj,Quad PF,6+ Mos 05/14/2013, 01/17/2014, 04/03/2015, 12/17/2015, 11/24/2016  . Pneumococcal Conjugate-13 08/08/2014  . Pneumococcal Polysaccharide-23 10/13/2009, 08/12/2015  . Tdap 08/24/2005  . Zoster 10/23/2012    Diabetes  She presents for her follow-up diabetic visit. She has type 2 diabetes mellitus. Her disease course has been worsening. There are no hypoglycemic associated symptoms. Pertinent negatives for hypoglycemia include no confusion, headaches, pallor or seizures. Associated symptoms include fatigue, polydipsia and polyuria. Pertinent negatives for diabetes include no chest pain and no polyphagia. There are no hypoglycemic complications. Symptoms are worsening. Diabetic complications  include nephropathy. Risk factors for coronary artery disease include dyslipidemia, diabetes mellitus, sedentary lifestyle and tobacco exposure. Current diabetic treatment includes insulin injections. She is compliant with treatment some of the time. Her weight is increasing steadily. She is following a generally unhealthy diet. When asked about meal planning, she reported none. She has had a previous visit with a dietitian. She rarely participates in exercise. Her home blood glucose trend is increasing steadily. Her breakfast blood glucose range is generally 180-200 mg/dl. Her bedtime blood glucose range is generally >200 mg/dl. Her overall blood glucose range is >200 mg/dl. (She denies hypoglycemia, but she worries about it . ) An ACE inhibitor/angiotensin II receptor blocker is being taken.  Hyperlipidemia  This is a chronic problem. The current episode started more than 1 year ago. Exacerbating diseases  include diabetes, hypothyroidism and obesity. Pertinent negatives include no chest pain, myalgias or shortness of breath. Current antihyperlipidemic treatment includes statins. Risk factors for coronary artery disease include dyslipidemia, diabetes mellitus, a sedentary lifestyle, post-menopausal and hypertension.  Hypertension  This is a chronic problem. The current episode started more than 1 year ago. The problem is uncontrolled. Pertinent negatives include no chest pain, headaches, palpitations or shortness of breath. Risk factors for coronary artery disease include diabetes mellitus, dyslipidemia, obesity and sedentary lifestyle. Past treatments include angiotensin blockers. Hypertensive end-organ damage includes kidney disease.      Review of Systems  Constitutional: Positive for fatigue. Negative for chills, fever and unexpected weight change.  HENT: Negative for trouble swallowing and voice change.   Eyes: Negative for visual disturbance.  Respiratory: Negative for cough, shortness of breath  and wheezing.   Cardiovascular: Negative for chest pain, palpitations and leg swelling.  Gastrointestinal: Negative for diarrhea, nausea and vomiting.  Endocrine: Positive for polydipsia and polyuria. Negative for cold intolerance, heat intolerance and polyphagia.  Musculoskeletal: Negative for arthralgias and myalgias.  Skin: Negative for color change, pallor, rash and wound.  Neurological: Negative for seizures and headaches.  Psychiatric/Behavioral: Negative for confusion and suicidal ideas.    Objective:    BP (!) 180/93   Pulse 93   Ht 5' 1.5" (1.562 m)   Wt 168 lb (76.2 kg)   BMI 31.23 kg/m   Wt Readings from Last 3 Encounters:  04/28/17 168 lb (76.2 kg)  04/13/17 165 lb (74.8 kg)  02/22/17 165 lb 3.2 oz (74.9 kg)    Physical Exam  Constitutional: She is oriented to person, place, and time. She appears well-developed.  HENT:  Head: Normocephalic and atraumatic.  Eyes: EOM are normal.  Neck: Normal range of motion. Neck supple. No tracheal deviation present. No thyromegaly present.  Cardiovascular: Normal rate and regular rhythm.  Pulmonary/Chest: Effort normal and breath sounds normal.  Abdominal: Soft. Bowel sounds are normal. There is no tenderness. There is no guarding.  Musculoskeletal: Normal range of motion. She exhibits no edema.  Neurological: She is alert and oriented to person, place, and time. She has normal reflexes. No cranial nerve deficit. Coordination normal.  Skin: Skin is warm and dry. No rash noted. No erythema. No pallor.  Psychiatric: She has a normal mood and affect. Judgment normal.    Results for orders placed or performed in visit on 02/08/17  H. pylori breath test  Result Value Ref Range   H. pylori Breath Test NOT DETECTED NOT DETECT   Diabetic Labs (most recent): Lab Results  Component Value Date   HGBA1C 9.0 (H) 04/20/2017   HGBA1C 8.7 (H) 01/18/2017   HGBA1C 8.9 (H) 10/13/2016   Lipid Panel     Component Value Date/Time   CHOL  109 01/18/2017 0736   TRIG 139 01/18/2017 0736   HDL 37 (L) 01/18/2017 0736   CHOLHDL 2.9 01/18/2017 0736   VLDL 21 08/19/2016 0708   LDLCALC 48 08/19/2016 0708     Assessment & Plan:   1. Diabetes mellitus with stage 3 chronic kidney disease ([place]) -She remains at a high risk for more acute and chronic complications of diabetes which include CAD, CVA, CKD, retinopathy, and neuropathy. These are all discussed in detail with the patient.  Patient came with  above target glucose profile, both fasting and postprandial. Her A1c is slowly increasing up to 9%. - She still insists that she feels symptomatic when her blood glucose drops below 150  and she is trying to avoid readings below 150.   Glucose logs and insulin administration records pertaining to this visit,  to be scanned into patient's records.  Recent labs reviewed.   - I have re-counseled the patient on diet management andweight loss  by adopting a carbohydrate restricted / protein rich  Diet.  -  Suggestion is made for her to avoid simple carbohydrates  from her diet including Cakes, Sweet Desserts / Pastries, Ice Cream, Soda (diet and regular), Sweet Tea, Candies, Chips, Cookies, Store Bought Juices, Alcohol in Excess of  1-2 drinks a day, Artificial Sweeteners, and "Sugar-free" Products. This will help patient to have stable blood glucose profile and potentially avoid unintended weight gain.  - Patient is advised to stick to a routine mealtimes to eat 3 meals  a day and avoid unnecessary snacks ( to snack only to correct hypoglycemia).   - I have approached patient with the following individualized plan to manage diabetes and patient agrees.  - She lives alone. She does not want to tighten control ,  due to fear of hypoglycemia.  - She wishes to keep her blood glucose readings above 150. - I decided to take diabetes control slowly with her , told her for her to be comfortable.  - She agrees to increase Lantus to 70 units at  bedtime associated with strict monitoring of blood glucose 2 times a day-daily before breakfast and at bedtime.  -Continue Tradjenta 5 mg by mouth every morning.  - Patient specific target  for A1c; LDL, HDL, Triglycerides, and  Waist Circumference were discussed in detail.  2) BP/HTN:  Her blood pressure remains to be uncontrolled.  She did have recent adjustment in her blood pressure medications. Her hydrochlorothiazide was increased to 25 mg by mouth daily this last week.  I have advised her to continue clonidine 0.1 mg by mouth twice a day as well, and keep close follow-up with her PMD Dr. Moshe Cipro.  3) Lipids/HPL: Her lipid panel shows controlled LDL at 50. I advised her to continue simvastatin 10 mg by mouth daily at bedtime. Side effects and precautions discussed with her.  4)  Weight/Diet: CDE consult in progress, exercise, and carbohydrates information provided.  5) hypothyroidism:  her thyroid function tests are consistent with appropriate replacement. I will  continue Levothyroxine 88 g by mouth every morning.   - We discussed about correct intake of levothyroxine, at fasting, with water, separated by at least 30 minutes from breakfast, and separated by more than 4 hours from calcium, iron, multivitamins, acid reflux medications (PPIs). -Patient is made aware of the fact that thyroid hormone replacement is needed for life, dose to be adjusted by periodic monitoring of thyroid function tests.  6) Chronic Care/Health Maintenance:  -Patient is on ACEI/ARB and Statin medications and encouraged to continue to follow up with Ophthalmology, Podiatrist at least yearly or according to recommendations, and advised to  stay away from smoking. I have recommended yearly flu vaccine and pneumonia vaccination at least every 5 years; moderate intensity exercise for up to 150 minutes weekly; and  sleep for at least 7 hours a day.  - Time spent with the patient: 25 min, of which >50% was spent in  reviewing her blood glucose logs , discussing her hypo- and hyper-glycemic episodes, reviewing her current and  previous labs and insulin doses and developing a plan to avoid hypo- and hyper-glycemia. Please refer to Patient Instructions for Blood Glucose Monitoring and Insulin/Medications Dosing Guide"  in  media tab for additional information.  - I advised patient to maintain close follow up with [name], MD for primary care needs.  Follow up plan: -Return in about 3 months (around 07/27/2017) for meter, and logs.  Glade Lloyd, MD Phone: [phone]  Fax: [phone]  -  This note was partially dictated with voice recognition software. Similar sounding words can be transcribed inadequately or may not  be corrected upon review.  04/28/2017, 9:24 AM

## 2017-04-28 NOTE — Patient Instructions (Signed)

## 2017-04-28 NOTE — Telephone Encounter (Signed)
Faxed a note requesting more information because I couldn't get them on the phone

## 2017-04-29 DIAGNOSIS — M545 Low back pain: Secondary | ICD-10-CM | POA: Diagnosis not present

## 2017-04-29 DIAGNOSIS — R293 Abnormal posture: Secondary | ICD-10-CM | POA: Diagnosis not present

## 2017-04-29 DIAGNOSIS — G8929 Other chronic pain: Secondary | ICD-10-CM | POA: Diagnosis not present

## 2017-04-29 DIAGNOSIS — M2560 Stiffness of unspecified joint, not elsewhere classified: Secondary | ICD-10-CM | POA: Diagnosis not present

## 2017-05-02 ENCOUNTER — Ambulatory Visit: Payer: Medicare HMO

## 2017-05-02 DIAGNOSIS — R293 Abnormal posture: Secondary | ICD-10-CM | POA: Diagnosis not present

## 2017-05-02 DIAGNOSIS — M545 Low back pain: Secondary | ICD-10-CM | POA: Diagnosis not present

## 2017-05-02 DIAGNOSIS — M2560 Stiffness of unspecified joint, not elsewhere classified: Secondary | ICD-10-CM | POA: Diagnosis not present

## 2017-05-02 DIAGNOSIS — G8929 Other chronic pain: Secondary | ICD-10-CM | POA: Diagnosis not present

## 2017-05-03 ENCOUNTER — Encounter: Payer: Self-pay | Admitting: Family Medicine

## 2017-05-03 NOTE — Assessment & Plan Note (Signed)
Hyperlipidemia:Low fat diet discussed and encouraged.   Lipid Panel  Lab Results  Component Value Date   CHOL 109 01/18/2017   HDL 37 (L) 01/18/2017   LDLCALC 48 08/19/2016   TRIG 139 01/18/2017   CHOLHDL 2.9 01/18/2017  Controlled, no change in medication

## 2017-05-03 NOTE — Assessment & Plan Note (Signed)
Uncontrolled, managed by endo.encouraged pt to become engaged with treatment plan Continues to report hypoglycemia and she remains markedly uncontrolled Christine Dominguez is reminded of the importance of commitment to daily physical activity for 30 minutes or more, as able and the need to limit carbohydrate intake to 30 to 60 grams per meal to help with blood sugar control.   The need to take medication as prescribed, test blood sugar as directed, and to call between visits if there is a concern that blood sugar is uncontrolled is also discussed.   Christine Dominguez is reminded of the importance of daily foot exam, annual eye examination, and good blood sugar, blood pressure and cholesterol control.  Diabetic Labs Latest Ref Rng & Units 04/20/2017 01/18/2017 10/13/2016 08/19/2016 06/16/2016  HbA1c <5.7 % of total Hgb 9.0(H) 8.7(H) 8.9(H) - 8.9(H)  Microalbumin Not estab mg/dL - - - - -  Micro/Creat Ratio <30 mcg/mg creat - - - - -  Chol <200 mg/dL - 109 - 106 -  HDL >50 mg/dL - 37(L) - 37(L) -  Calc LDL <100 mg/dL - - - 48 -  Triglycerides <150 mg/dL - 139 - 106 -  Creatinine 0.50 - 0.99 mg/dL 1.37(H) 1.49(H) 1.44(H) 1.41(H) 1.41(H)   BP/Weight 04/28/2017 04/13/2017 02/22/2017 01/25/2017 01/06/2017 11/24/2016 10/04/7791  Systolic BP 903 009 233 007 622 633 354  Diastolic BP 93 80 94 91 72 82 80  Wt. (Lbs) 168 165 165.2 168 169.5 167 169.75  BMI 31.23 30.67 30.71 31.74 32.03 31.55 32.07   Foot/eye exam completion dates Latest Ref Rng & Units 08/26/2016 11/25/2015  Eye Exam No Retinopathy - No Retinopathy  Foot exam Order - - -  Foot Form Completion - Done -

## 2017-05-03 NOTE — Assessment & Plan Note (Addendum)
Uncontrolled  Clonidine dose increased , pateint will f/u with cardiology DASH diet and commitment to daily physical activity for a minimum of 30 minutes discussed and encouraged, as a part of hypertension management. The importance of attaining a healthy weight is also discussed.  BP/Weight 04/28/2017 04/13/2017 02/22/2017 01/25/2017 01/06/2017 11/24/2016 0/96/2836  Systolic BP 629 476 546 503 546 568 127  Diastolic BP 93 80 94 91 72 82 80  Wt. (Lbs) 168 165 165.2 168 169.5 167 169.75  BMI 31.23 30.67 30.71 31.74 32.03 31.55 32.07

## 2017-05-03 NOTE — Progress Notes (Signed)
Christine Dominguez     MRN: [id number]      DOB: [birth year]   HPI Christine Dominguez is here for follow up and re-evaluation of chronic medical conditions, esp uncontrolled hypertension, medication management and review of any available recent lab and radiology data.     Questions or concerns regarding consultations or procedures which the PT has had in the interim are  addressed.    ROS Denies recent fever or chills. Denies sinus pressure, nasal congestion, ear pain or sore throat. Denies chest congestion, productive cough or wheezing. Denies chest pains, palpitations and leg swelling Denies abdominal pain, nausea, vomiting,diarrhea or constipation.   Denies dysuria, frequency, hesitancy or incontinence.  Denies headaches, seizures, numbness, or tingling. Denies depression, anxiety or insomnia. Denies skin break down or rash.   PE  BP (!) 180/80   Pulse 86   Resp 16   Ht 5' 1.5" (1.562 m)   Wt 165 lb (74.8 kg)   SpO2 98%   BMI 30.67 kg/m   Patient alert and oriented and in no cardiopulmonary distress.  HEENT: No facial asymmetry, EOMI,   oropharynx pink and moist.  Neck supple no JVD, no mass.  Chest: Clear to auscultation bilaterally.  CVS: S1, S2 no murmurs, no S3.Regular rate.  ABD: Soft non tender.   Ext: No edema  MS: decreased ROM spine, shoulders, hips and knees.  Skin: Intact, no ulcerations or rash noted.  Psych: Good eye contact, normal affect. Memory intact not anxious or depressed appearing.  CNS: CN 2-12 intact, power,  normal throughout.no focal deficits noted.   Assessment & Plan  Malignant hypertension Uncontrolled  Clonidine dose increased , pateint will f/u with cardiology DASH diet and commitment to daily physical activity for a minimum of 30 minutes discussed and encouraged, as a part of hypertension management. The importance of attaining a healthy weight is also discussed.  BP/Weight 04/28/2017 04/13/2017 02/22/2017 01/25/2017  01/06/2017 11/24/2016 4/76/5465  Systolic BP 035 465 681 275 170 017 494  Diastolic BP 93 80 94 91 72 82 80  Wt. (Lbs) 168 165 165.2 168 169.5 167 169.75  BMI 31.23 30.67 30.71 31.74 32.03 31.55 32.07       Back pain Back pain chronic and ongoing, has benefited from physical therapy to some extent  Diabetes mellitus with stage 3 chronic kidney disease ([facility]) Uncontrolled, managed by endo.encouraged pt to become engaged with treatment plan Continues to report hypoglycemia and she remains markedly uncontrolled Christine Dominguez is reminded of the importance of commitment to daily physical activity for 30 minutes or more, as able and the need to limit carbohydrate intake to 30 to 60 grams per meal to help with blood sugar control.   The need to take medication as prescribed, test blood sugar as directed, and to call between visits if there is a concern that blood sugar is uncontrolled is also discussed.   Christine Dominguez is reminded of the importance of daily foot exam, annual eye examination, and good blood sugar, blood pressure and cholesterol control.  Diabetic Labs Latest Ref Rng & Units 04/20/2017 01/18/2017 10/13/2016 08/19/2016 06/16/2016  HbA1c <5.7 % of total Hgb 9.0(H) 8.7(H) 8.9(H) - 8.9(H)  Microalbumin Not estab mg/dL - - - - -  Micro/Creat Ratio <30 mcg/mg creat - - - - -  Chol <200 mg/dL - 109 - 106 -  HDL >50 mg/dL - 37(L) - 37(L) -  Calc LDL <100 mg/dL - - - 48 -  Triglycerides <150 mg/dL -  139 - 106 -  Creatinine 0.50 - 0.99 mg/dL 1.37(H) 1.49(H) 1.44(H) 1.41(H) 1.41(H)   BP/Weight 04/28/2017 04/13/2017 02/22/2017 01/25/2017 01/06/2017 11/24/2016 2/50/5397  Systolic BP 673 419 379 024 097 353 299  Diastolic BP 93 80 94 91 72 82 80  Wt. (Lbs) 168 165 165.2 168 169.5 167 169.75  BMI 31.23 30.67 30.71 31.74 32.03 31.55 32.07   Foot/eye exam completion dates Latest Ref Rng & Units 08/26/2016 11/25/2015  Eye Exam No Retinopathy - No Retinopathy  Foot exam Order - - -  Foot  Form Completion - Done -         Mixed hyperlipidemia Hyperlipidemia:Low fat diet discussed and encouraged.   Lipid Panel  Lab Results  Component Value Date   CHOL 109 01/18/2017   HDL 37 (L) 01/18/2017   LDLCALC 48 08/19/2016   TRIG 139 01/18/2017   CHOLHDL 2.9 01/18/2017  Controlled, no change in medication

## 2017-05-03 NOTE — Assessment & Plan Note (Signed)
Back pain chronic and ongoing, has benefited from physical therapy to some extent

## 2017-05-04 DIAGNOSIS — R293 Abnormal posture: Secondary | ICD-10-CM | POA: Diagnosis not present

## 2017-05-04 DIAGNOSIS — G8929 Other chronic pain: Secondary | ICD-10-CM | POA: Diagnosis not present

## 2017-05-04 DIAGNOSIS — M2560 Stiffness of unspecified joint, not elsewhere classified: Secondary | ICD-10-CM | POA: Diagnosis not present

## 2017-05-04 DIAGNOSIS — M545 Low back pain: Secondary | ICD-10-CM | POA: Diagnosis not present

## 2017-05-05 ENCOUNTER — Telehealth: Payer: Self-pay | Admitting: *Deleted

## 2017-05-05 ENCOUNTER — Other Ambulatory Visit: Payer: Self-pay | Admitting: "Endocrinology

## 2017-05-05 NOTE — Telephone Encounter (Signed)
Patient called requesting to talk to [facility]. Please advise

## 2017-05-09 ENCOUNTER — Telehealth: Payer: Self-pay | Admitting: Family Medicine

## 2017-05-09 NOTE — Telephone Encounter (Signed)
Left patient a vm with referral options ([facility] or  [facility]) she is to call me back with her decision so we can move forward with this referral request

## 2017-05-09 NOTE — Telephone Encounter (Signed)
Christine Dominguez from [facility] [phone]) is calling in advised that they received the fax however it is missing.... The Diagnosis, DR signature, Condition & Date... They will need this faxed over to [phone]

## 2017-05-09 NOTE — Telephone Encounter (Signed)
Patient is requesting a referral to Dr.Althiemer for a 2nd opinion. She says Dr.Nida treats her like a child and doesn't listen to her. She states she is ready to get her A1c back to where its suppose to be. Fax [phone]

## 2017-05-09 NOTE — Telephone Encounter (Signed)
That was the fax telling them that it had already been done. Original from a month ago REfaxed

## 2017-05-09 NOTE — Telephone Encounter (Signed)
Wants referral to another endo. Message sent

## 2017-05-09 NOTE — Telephone Encounter (Signed)
Pls give her the option of the other endo clinic aas may be seen sooner if she wants to go there then oK to refer to that office also, pls let me know

## 2017-05-10 NOTE — Progress Notes (Signed)
Cardiology Office Note  Date: 05/11/2017   ID: Christine Dominguez, DOB 1951/02/12, MRN [id number]  PCP: Christine Helper, MD  Primary Cardiologist: Christine Lesches, MD   Chief Complaint  Patient presents with  . Hypertension    History of Present Illness: Christine Dominguez is a 67 y.o. female last seen in May 2018.  She presents for a routine follow-up visit.  I reviewed the chart, recent notes by Dr. Moshe Dominguez.  She had clonidine dose increased recently with suboptimally controlled blood pressures.  She states that she has been taking her medications regularly.  We went over her current medications and discussed changing from hydrochlorothiazide to chlorthalidone.  She might be able to change clonidine ultimately to 0.2 mg twice daily to make it a little easier to take.  Past Medical History:  Diagnosis Date  . Anemia   . Arthritis    Cervical spine  . Cancer ([place]) 1998, recurred in 2012   Paraganglioma of jugular tympamicum, recurrent in 2012  . Chronic back pain 2007   Disabled   . Essential hypertension, benign 1980  . GERD (gastroesophageal reflux disease) 2000  . Hydrocephalus    Treated with VP shunt and then intracranial tumor resection (?Cholesteatoma); no longer followed actively by neurosurgery or otolaryngology  . Hyperlipidemia 2013  . Hypothyroidism 2013  . Neuropathy   . Obesity   . Type 2 diabetes mellitus ([place]) 1991    Past Surgical History:  Procedure Laterality Date  . CATARACT EXTRACTION W/PHACO Right 07/17/2013   Procedure: CATARACT EXTRACTION PHACO AND INTRAOCULAR LENS PLACEMENT (IOC);  Surgeon: [name], MD;  Location: [facility];  Service: Ophthalmology;  Laterality: Right;  CDE:7.24  . COLONOSCOPY  06/2010  . Hemorrhoidectomy    . Intracranial mass resected  1998   At [facility]; ? location-right middle ear  . Teeth pulled    . TRIGGER FINGER RELEASE     Rght thumb  . TUBAL LIGATION  1980  . TUBAL LIGATION    .  VENTRICULOPERITONEAL SHUNT  1998   At [facility]    Current Outpatient Medications  Medication Sig Dispense Refill  . ACCU-CHEK FASTCLIX LANCETS MISC TEST BLOOD SUGAR FOUR TIMES DAILY 408 each 1  . Alcohol Swabs (B-D SINGLE USE SWABS REGULAR) PADS TEST FOUR TIMES A DAY 400 each 2  . aspirin EC 81 MG tablet Take 81 mg by mouth at bedtime.    . B-D ULTRAFINE III SHORT PEN 31G X 8 MM MISC USE  TO INJECT EVERY DAY  WITH  LANTUS 90 each 2  . cloNIDine (CATAPRES) 0.1 MG tablet One and a half tablets at 6 am, then at 2 pm ,and two tablets at 10 pm 450 tablet 3  . docusate sodium (STOOL SOFTENER) 100 MG capsule Take 100 mg by mouth as needed for mild constipation.    . ferrous sulfate 325 (65 FE) MG EC tablet Take 325 mg by mouth daily.    Marland Kitchen gabapentin (NEURONTIN) 100 MG capsule Take 1 capsule (100 mg total) by mouth 2 (two) times daily. 180 capsule 3  . glucose (SUNMARK GLUCOSE) 4 GM chewable tablet Chew by mouth as needed.     Marland Kitchen glucose blood (ACCU-CHEK SMARTVIEW) test strip TEST 4 TIMES DAILY 100 each 2  . Insulin Glargine (LANTUS SOLOSTAR) 100 UNIT/ML Solostar Pen INJECT 70 UNITS INTO THE SKIN AT BEDTIME 45 mL 0  . levothyroxine (SYNTHROID, LEVOTHROID) 88 MCG tablet TAKE 1 TABLET EVERY DAY BEFORE BREAKFAST 90 tablet  0  . nystatin (MYCOSTATIN/NYSTOP) powder APPLY TOPICALLY TO RASH ON LOWER ABDOMEN AT BEDTIME AS NEEDED 120 g 1  . olmesartan (BENICAR) 40 MG tablet Take 1 tablet (40 mg total) by mouth daily. 90 tablet 3  . potassium chloride (K-DUR) 10 MEQ tablet Take 1 tablet (10 mEq total) by mouth 2 (two) times daily. 180 tablet 3  . Simethicone (GAS RELIEF DROPS PO) Take by mouth as needed.    . simvastatin (ZOCOR) 10 MG tablet TAKE 1 TABLET AT BEDTIME 90 tablet 1  . sodium chloride (OCEAN) 0.65 % nasal spray 1 spray as needed.     . TRADJENTA 5 MG TABS tablet TAKE 1 TABLET EVERY DAY 90 tablet 0   No current facility-administered medications for this visit.    Allergies:  Amlodipine; Amaryl;  Avandia [rosiglitazone maleate]; Glimepiride; Glipizide; Invokana [canagliflozin]; Lyrica [pregabalin]; Novolog [insulin aspart]; Rosiglitazone; Sitagliptin; and Aspirin   Social History: The patient  reports that she quit smoking about 15 years ago. Her smoking use included cigarettes. She has a 7.50 pack-year smoking history. she has never used smokeless tobacco. She reports that she does not drink alcohol or use drugs.   ROS:  Please see the history of present illness. Otherwise, complete review of systems is positive for none.  All other systems are reviewed and negative.   Physical Exam: VS:  BP (!) 162/90 (BP Location: Left Arm)   Pulse 78   Ht 5' 1.5" (1.562 m)   Wt 166 lb (75.3 kg)   SpO2 95%   BMI 30.86 kg/m , BMI Body mass index is 30.86 kg/m.  Wt Readings from Last 3 Encounters:  05/11/17 166 lb (75.3 kg)  04/28/17 168 lb (76.2 kg)  04/13/17 165 lb (74.8 kg)    General: Patient appears comfortable at rest. HEENT: Conjunctiva and lids normal, oropharynx clear. Neck: Supple, no elevated JVP or carotid bruits, no thyromegaly. Lungs: Clear to auscultation, nonlabored breathing at rest. Cardiac: Regular rate and rhythm, no S3, soft systolic murmur. Abdomen: Soft, nontender, bowel sounds present. Extremities: Trace ankle edema, distal pulses 2+.  ECG: I personally reviewed the tracing from 12/15/2015 which shows sinus rhythm.  Recent Labwork: 06/16/2016: TSH 0.14 08/19/2016: Hemoglobin 10.8; Platelets 238 04/20/2017: ALT 14; AST 14; BUN 22; Creat 1.37; Potassium 4.4; Sodium 137     Component Value Date/Time   CHOL 109 01/18/2017 0736   TRIG 139 01/18/2017 0736   HDL 37 (L) 01/18/2017 0736   CHOLHDL 2.9 01/18/2017 0736   VLDL 21 08/19/2016 0708   LDLCALC 48 08/19/2016 0708   Assessment and Plan:  1.  Essential hypertension.  Plan to continue Benicar and clonidine at current doses, change hydrochlorothiazide to chlorthalidone 25 mg daily.  Hopefully this will provide  better blood pressure control, if so consider changing clonidine to 0.2 mg twice daily.  Keep follow-up with Dr. Moshe Dominguez.  2.  Mixed hyperlipidemia, she continues on Zocor.  Last LDL 48.  Current medicines were reviewed with the patient today.   Orders Placed This Encounter  Procedures  . EKG 12-Lead    Disposition: Follow-up in 6 months.  Signed, Satira Sark, MD, [facility] 05/11/2017 2:44 PM    [facility] [address] [place], [place] [address] Phone: [phone]; Fax: [phone]

## 2017-05-11 ENCOUNTER — Ambulatory Visit (INDEPENDENT_AMBULATORY_CARE_PROVIDER_SITE_OTHER): Payer: Medicare HMO | Admitting: Cardiology

## 2017-05-11 ENCOUNTER — Encounter: Payer: Self-pay | Admitting: Cardiology

## 2017-05-11 VITALS — BP 162/90 | HR 78 | Ht 61.5 in | Wt 166.0 lb

## 2017-05-11 DIAGNOSIS — I10 Essential (primary) hypertension: Secondary | ICD-10-CM

## 2017-05-11 DIAGNOSIS — E782 Mixed hyperlipidemia: Secondary | ICD-10-CM | POA: Diagnosis not present

## 2017-05-11 MED ORDER — CHLORTHALIDONE 25 MG PO TABS: 25.0000 mg | ORAL_TABLET | Freq: Every day | ORAL | 3 refills | Status: DC

## 2017-05-11 NOTE — Patient Instructions (Signed)
Medication Instructions:  STOP HYDROCHLOROTHIAZIDE   START CHLORTHALIDONE 25 MG DAILY   Labwork: NONE  Testing/Procedures: NONE  Follow-Up: Your physician wants you to follow-up in: 6 MONTHS.  You will receive a reminder letter in the mail two months in advance. If you don't receive a letter, please call our office to schedule the follow-up appointment.   Any Other Special Instructions Will Be Listed Below (If Applicable).     If you need a refill on your cardiac medications before your next appointment, please call your pharmacy.

## 2017-05-16 ENCOUNTER — Ambulatory Visit (INDEPENDENT_AMBULATORY_CARE_PROVIDER_SITE_OTHER): Payer: Medicare HMO

## 2017-05-16 VITALS — BP 160/70 | HR 97 | Temp 98.6°F | Resp 16 | Ht 61.5 in | Wt 166.0 lb

## 2017-05-16 DIAGNOSIS — Z0000 Encounter for general adult medical examination without abnormal findings: Secondary | ICD-10-CM | POA: Diagnosis not present

## 2017-05-16 NOTE — Patient Instructions (Signed)
Christine Dominguez , Thank you for taking time to come for your Medicare Wellness Visit. I appreciate your ongoing commitment to your health goals. Please review the following plan we discussed and let me know if I can assist you in the future.   Screening recommendations/referrals: Colonoscopy: Please discuss the need for this with Dr. Moshe Cipro Mammogram: Due 09/2017 Bone Density: Done Recommended yearly ophthalmology/optometry visit for glaucoma screening and checkup Recommended yearly dental visit for hygiene and checkup  Vaccinations: Influenza vaccine: Due fall 2019 Pneumococcal vaccine: Due 2022 Tdap vaccine: You are due for Tdap, please discuss this with Dr. Moshe Cipro at your next visit Shingles vaccine: You have had the Zostavax, it is recommended you update with Shingrix. Please discuss with Dr. Moshe Cipro at your next visit. Also, please call your insurance for coverage information    Advanced directives: Discussed today in office  Conditions/risks identified: None  Next appointment: 07/27/2017   Preventive Care 65 Years and Older, Female Preventive care refers to lifestyle choices and visits with your health care provider that can promote health and wellness. What does preventive care include?  A yearly physical exam. This is also called an annual well check.  Dental exams once or twice a year.  Routine eye exams. Ask your health care provider how often you should have your eyes checked.  Personal lifestyle choices, including:  Daily care of your teeth and gums.  Regular physical activity.  Eating a healthy diet.  Avoiding tobacco and drug use.  Limiting alcohol use.  Practicing safe sex.  Taking low-dose aspirin every day.  Taking vitamin and mineral supplements as recommended by your health care provider. What happens during an annual well check? The services and screenings done by your health care provider during your annual well check will depend on your  age, overall health, lifestyle risk factors, and family history of disease. Counseling  Your health care provider may ask you questions about your:  Alcohol use.  Tobacco use.  Drug use.  Emotional well-being.  Home and relationship well-being.  Sexual activity.  Eating habits.  History of falls.  Memory and ability to understand (cognition).  Work and work Statistician.  Reproductive health. Screening  You may have the following tests or measurements:  Height, weight, and BMI.  Blood pressure.  Lipid and cholesterol levels. These may be checked every 5 years, or more frequently if you are over 23 years old.  Skin check.  Lung cancer screening. You may have this screening every year starting at age 91 if you have a 30-pack-year history of smoking and currently smoke or have quit within the past 15 years.  Fecal occult blood test (FOBT) of the stool. You may have this test every year starting at age 57.  Flexible sigmoidoscopy or colonoscopy. You may have a sigmoidoscopy every 5 years or a colonoscopy every 10 years starting at age 10.  Hepatitis C blood test.  Hepatitis B blood test.  Sexually transmitted disease (STD) testing.  Diabetes screening. This is done by checking your blood sugar (glucose) after you have not eaten for a while (fasting). You may have this done every 1-3 years.  Bone density scan. This is done to screen for osteoporosis. You may have this done starting at age 93.  Mammogram. This may be done every 1-2 years. Talk to your health care provider about how often you should have regular mammograms. Talk with your health care provider about your test results, treatment options, and if necessary, the need for  more tests. Vaccines  Your health care provider may recommend certain vaccines, such as:  Influenza vaccine. This is recommended every year.  Tetanus, diphtheria, and acellular pertussis (Tdap, Td) vaccine. You may need a Td booster  every 10 years.  Zoster vaccine. You may need this after age 46.  Pneumococcal 13-valent conjugate (PCV13) vaccine. One dose is recommended after age 2.  Pneumococcal polysaccharide (PPSV23) vaccine. One dose is recommended after age 62. Talk to your health care provider about which screenings and vaccines you need and how often you need them. This information is not intended to replace advice given to you by your health care provider. Make sure you discuss any questions you have with your health care provider. Document Released: 04/18/2015 Document Revised: 12/10/2015 Document Reviewed: 01/21/2015 Elsevier Interactive Patient Education  2017 [place] Prevention in the Home Falls can cause injuries. They can happen to people of all ages. There are many things you can do to make your home safe and to help prevent falls. What can I do on the outside of my home?  Regularly fix the edges of walkways and driveways and fix any cracks.  Remove anything that might make you trip as you walk through a door, such as a raised step or threshold.  Trim any bushes or trees on the path to your home.  Use bright outdoor lighting.  Clear any walking paths of anything that might make someone trip, such as rocks or tools.  Regularly check to see if handrails are loose or broken. Make sure that both sides of any steps have handrails.  Any raised decks and porches should have guardrails on the edges.  Have any leaves, snow, or ice cleared regularly.  Use sand or salt on walking paths during winter.  Clean up any spills in your garage right away. This includes oil or grease spills. What can I do in the bathroom?  Use night lights.  Install grab bars by the toilet and in the tub and shower. Do not use towel bars as grab bars.  Use non-skid mats or decals in the tub or shower.  If you need to sit down in the shower, use a plastic, non-slip stool.  Keep the floor dry. Clean up any  water that spills on the floor as soon as it happens.  Remove soap buildup in the tub or shower regularly.  Attach bath mats securely with double-sided non-slip rug tape.  Do not have throw rugs and other things on the floor that can make you trip. What can I do in the bedroom?  Use night lights.  Make sure that you have a light by your bed that is easy to reach.  Do not use any sheets or blankets that are too big for your bed. They should not hang down onto the floor.  Have a firm chair that has side arms. You can use this for support while you get dressed.  Do not have throw rugs and other things on the floor that can make you trip. What can I do in the kitchen?  Clean up any spills right away.  Avoid walking on wet floors.  Keep items that you use a lot in easy-to-reach places.  If you need to reach something above you, use a strong step stool that has a grab bar.  Keep electrical cords out of the way.  Do not use floor polish or wax that makes floors slippery. If you must use wax, use non-skid  floor wax.  Do not have throw rugs and other things on the floor that can make you trip. What can I do with my stairs?  Do not leave any items on the stairs.  Make sure that there are handrails on both sides of the stairs and use them. Fix handrails that are broken or loose. Make sure that handrails are as long as the stairways.  Check any carpeting to make sure that it is firmly attached to the stairs. Fix any carpet that is loose or worn.  Avoid having throw rugs at the top or bottom of the stairs. If you do have throw rugs, attach them to the floor with carpet tape.  Make sure that you have a light switch at the top of the stairs and the bottom of the stairs. If you do not have them, ask someone to add them for you. What else can I do to help prevent falls?  Wear shoes that:  Do not have high heels.  Have rubber bottoms.  Are comfortable and fit you well.  Are closed  at the toe. Do not wear sandals.  If you use a stepladder:  Make sure that it is fully opened. Do not climb a closed stepladder.  Make sure that both sides of the stepladder are locked into place.  Ask someone to hold it for you, if possible.  Clearly mark and make sure that you can see:  Any grab bars or handrails.  First and last steps.  Where the edge of each step is.  Use tools that help you move around (mobility aids) if they are needed. These include:  Canes.  Walkers.  Scooters.  Crutches.  Turn on the lights when you go into a dark area. Replace any light bulbs as soon as they burn out.  Set up your furniture so you have a clear path. Avoid moving your furniture around.  If any of your floors are uneven, fix them.  If there are any pets around you, be aware of where they are.  Review your medicines with your doctor. Some medicines can make you feel dizzy. This can increase your chance of falling. Ask your doctor what other things that you can do to help prevent falls. This information is not intended to replace advice given to you by your health care provider. Make sure you discuss any questions you have with your health care provider. Document Released: 01/16/2009 Document Revised: 08/28/2015 Document Reviewed: 04/26/2014 Elsevier Interactive Patient Education  2017 [place] for Adults  A healthy lifestyle and preventive care can promote health and wellness. Preventive health guidelines for adults include the following key practices.  . A routine yearly physical is a good way to check with your health care provider about your health and preventive screening. It is a chance to share any concerns and updates on your health and to receive a thorough exam.  . Visit your dentist for a routine exam and preventive care every 6 months. Brush your teeth twice a day and floss once a day. Good oral hygiene prevents tooth decay and gum disease.  .  The frequency of eye exams is based on your age, health, family medical history, use  of contact lenses, and other factors. Follow your health care provider's ecommendations for frequency of eye exams.  . Eat a healthy diet. Foods like vegetables, fruits, whole grains, low-fat dairy products, and lean protein foods contain the nutrients you need without too many calories. Decrease  your intake of foods high in solid fats, added sugars, and salt. Eat the right amount of calories for you. Get information about a proper diet from your health care provider, if necessary.  . Regular physical exercise is one of the most important things you can do for your health. Most adults should get at least 150 minutes of moderate-intensity exercise (any activity that increases your heart rate and causes you to sweat) each week. In addition, most adults need muscle-strengthening exercises on 2 or more days a week.  Silver Sneakers may be a benefit available to you. To determine eligibility, you may visit the website: www.silversneakers.com or contact program at (573)255-8063 Mon-Fri between 8AM-8PM.   . Maintain a healthy weight. The body mass index (BMI) is a screening tool to identify possible weight problems. It provides an estimate of body fat based on height and weight. Your health care provider can find your BMI and can help you achieve or maintain a healthy weight.   For adults 20 years and older: ? A BMI below 18.5 is considered underweight. ? A BMI of 18.5 to 24.9 is normal. ? A BMI of 25 to 29.9 is considered overweight. ? A BMI of 30 and above is considered obese.   . Maintain normal blood lipids and cholesterol levels by exercising and minimizing your intake of saturated fat. Eat a balanced diet with plenty of fruit and vegetables. Blood tests for lipids and cholesterol should begin at age 23 and be repeated every 5 years. If your lipid or cholesterol levels are high, you are over 50, or you are at high  risk for heart disease, you may need your cholesterol levels checked more frequently. Ongoing high lipid and cholesterol levels should be treated with medicines if diet and exercise are not working.  . If you smoke, find out from your health care provider how to quit. If you do not use tobacco, please do not start.  . If you choose to drink alcohol, please do not consume more than 2 drinks per day. One drink is considered to be 12 ounces (355 mL) of beer, 5 ounces (148 mL) of wine, or 1.5 ounces (44 mL) of liquor.  . If you are 21-48 years old, ask your health care provider if you should take aspirin to prevent strokes.  . Use sunscreen. Apply sunscreen liberally and repeatedly throughout the day. You should seek shade when your shadow is shorter than you. Protect yourself by wearing long sleeves, pants, a wide-brimmed hat, and sunglasses year round, whenever you are outdoors.  . Once a month, do a whole body skin exam, using a mirror to look at the skin on your back. Tell your health care provider of new moles, moles that have irregular borders, moles that are larger than a pencil eraser, or moles that have changed in shape or color.

## 2017-05-16 NOTE — Progress Notes (Signed)
Subjective:   Christine Dominguez is a 67 y.o. female who presents for Medicare Annual (Subsequent) preventive examination.  Review of Systems:   Cardiac Risk Factors include: advanced age (>77men, >43 women);diabetes mellitus;hypertension;sedentary lifestyle     Objective:     Vitals: BP (!) 160/70 (BP Location: Left Arm, Patient Position: Sitting, Cuff Size: Normal)   Pulse 97   Temp 98.6 F (37 C) (Temporal)   Resp 16   Ht 5' 1.5" (1.562 m)   Wt 166 lb (75.3 kg)   SpO2 99%   BMI 30.86 kg/m   Body mass index is 30.86 kg/m.  Advanced Directives 05/16/2017 07/29/2016 04/28/2016 04/01/2016 12/15/2015 07/02/2015 06/16/2015  Does Patient Have a Medical Advance Directive? No No No No No - No  Would patient like information on creating a medical advance directive? No - Patient declined No - Patient declined Yes (MAU/Ambulatory/Procedural Areas - Information given) - No - patient declined information No - patient declined information No - patient declined information  Pre-existing out of facility DNR order (yellow form or pink MOST form) - - - - - - -    Tobacco Social History   Tobacco Use  Smoking Status Former Smoker  . Packs/day: 0.50  . Years: 15.00  . Pack years: 7.50  . Types: Cigarettes  . Last attempt to quit: 07/07/2001  . Years since quitting: 15.8  Smokeless Tobacco Never Used     Counseling given: Not Answered   Clinical Intake:  Pre-visit preparation completed: Yes  Pain : 0-10 Pain Score: 4  Pain Location: Back Pain Onset: More than a month ago Pain Frequency: Constant     Diabetes: Yes CBG done?: No Did pt. bring in CBG monitor from home?: No  How often do you need to have someone help you when you read instructions, pamphlets, or other written materials from your doctor or pharmacy?: 1 - Never  Interpreter Needed?: No     Past Medical History:  Diagnosis Date  . Anemia   . Arthritis    Cervical spine  . Cancer ([place]) 1998, recurred in  2012   Paraganglioma of jugular tympamicum, recurrent in 2012  . Chronic back pain 2007   Disabled   . Essential hypertension, benign 1980  . GERD (gastroesophageal reflux disease) 2000  . Hydrocephalus    Treated with VP shunt and then intracranial tumor resection (?Cholesteatoma); no longer followed actively by neurosurgery or otolaryngology  . Hyperlipidemia 2013  . Hypothyroidism 2013  . Neuropathy   . Obesity   . Type 2 diabetes mellitus ([place]) 1991   Past Surgical History:  Procedure Laterality Date  . CATARACT EXTRACTION W/PHACO Right 07/17/2013   Procedure: CATARACT EXTRACTION PHACO AND INTRAOCULAR LENS PLACEMENT (IOC);  Surgeon: [name], MD;  Location: [facility];  Service: Ophthalmology;  Laterality: Right;  CDE:7.24  . COLONOSCOPY  06/2010  . Hemorrhoidectomy    . Intracranial mass resected  1998   At [facility]; ? location-right middle ear  . Teeth pulled    . TRIGGER FINGER RELEASE     Rght thumb  . TUBAL LIGATION  1980  . TUBAL LIGATION    . VENTRICULOPERITONEAL SHUNT  1998   At [facility]   Family History  Problem Relation Age of Onset  . Lung cancer Mother   . Hypertension Mother 65  . Hypertension Brother   . Diabetes Brother   . Diabetes Sister   . Kidney failure Brother   . Hypertension Maternal Grandmother   .  Heart disease Maternal Grandmother   . Other Son        stillborn  . Other Son        died at birth  . Diabetes Maternal Grandfather   . Other Son        was murdered   Social History   Socioeconomic History  . Marital status: Widowed    Spouse name: None  . Number of children: 3  . Years of education: None  . Highest education level: None  Social Needs  . Financial resource strain: Not hard at all  . Food insecurity - worry: Never true  . Food insecurity - inability: Never true  . Transportation needs - medical: No  . Transportation needs - non-medical: No  Occupational History  . Occupation: disabled   Tobacco Use  . Smoking status:  Former Smoker    Packs/day: 0.50    Years: 15.00    Pack years: 7.50    Types: Cigarettes    Last attempt to quit: 07/07/2001    Years since quitting: 15.8  . Smokeless tobacco: Never Used  Substance and Sexual Activity  . Alcohol use: No    Alcohol/week: 0.0 oz  . Drug use: No  . Sexual activity: No    Birth control/protection: Surgical    Comment: tubal  Other Topics Concern  . None  Social History Narrative   Mother of 3 - all children are deceased          Outpatient Encounter Medications as of 05/16/2017  Medication Sig  . ACCU-CHEK FASTCLIX LANCETS MISC TEST BLOOD SUGAR FOUR TIMES DAILY  . Alcohol Swabs (B-D SINGLE USE SWABS REGULAR) PADS TEST FOUR TIMES A DAY  . aspirin EC 81 MG tablet Take 81 mg by mouth at bedtime.  . B-D ULTRAFINE III SHORT PEN 31G X 8 MM MISC USE  TO INJECT EVERY DAY  WITH  LANTUS  . chlorthalidone (HYGROTON) 25 MG tablet Take 1 tablet (25 mg total) by mouth daily.  . cloNIDine (CATAPRES) 0.1 MG tablet One and a half tablets at 6 am, then at 2 pm ,and two tablets at 10 pm  . docusate sodium (STOOL SOFTENER) 100 MG capsule Take 100 mg by mouth as needed for mild constipation.  . ferrous sulfate 325 (65 FE) MG EC tablet Take 325 mg by mouth daily.  Marland Kitchen gabapentin (NEURONTIN) 100 MG capsule Take 1 capsule (100 mg total) by mouth 2 (two) times daily.  Marland Kitchen glucose (SUNMARK GLUCOSE) 4 GM chewable tablet Chew by mouth as needed.   Marland Kitchen glucose blood (ACCU-CHEK SMARTVIEW) test strip TEST 4 TIMES DAILY  . Insulin Glargine (LANTUS SOLOSTAR) 100 UNIT/ML Solostar Pen INJECT 70 UNITS INTO THE SKIN AT BEDTIME  . levothyroxine (SYNTHROID, LEVOTHROID) 88 MCG tablet TAKE 1 TABLET EVERY DAY BEFORE BREAKFAST  . nystatin (MYCOSTATIN/NYSTOP) powder APPLY TOPICALLY TO RASH ON LOWER ABDOMEN AT BEDTIME AS NEEDED  . olmesartan (BENICAR) 40 MG tablet Take 1 tablet (40 mg total) by mouth daily.  . potassium chloride (K-DUR) 10 MEQ tablet Take 1 tablet (10 mEq total) by mouth 2 (two)  times daily.  . Simethicone (GAS RELIEF DROPS PO) Take by mouth as needed.  . simvastatin (ZOCOR) 10 MG tablet TAKE 1 TABLET AT BEDTIME  . sodium chloride (OCEAN) 0.65 % nasal spray 1 spray as needed.   . TRADJENTA 5 MG TABS tablet TAKE 1 TABLET EVERY DAY   No facility-administered encounter medications on file as of 05/16/2017.  Activities of Daily Living In your present state of health, do you have any difficulty performing the following activities: 05/16/2017  Hearing? Y  Vision? N  Difficulty concentrating or making decisions? N  Walking or climbing stairs? Y  Dressing or bathing? N  Doing errands, shopping? N  Preparing Food and eating ? N  Using the Toilet? N  In the past six months, have you accidently leaked urine? N  Do you have problems with loss of bowel control? N  Managing your Medications? N  Managing your Finances? N  Housekeeping or managing your Housekeeping? N  Some recent data might be hidden    Patient Care Team: [name], MD as PCP - General Rutherford Guys, MD as Consulting Physician (Ophthalmology) Satira Sark, MD as Consulting Physician (Cardiology) Scherry Ran, MD (Otolaryngology) Jonnie Kind, MD as Consulting Physician (Obstetrics and Gynecology) Cassandria Anger, MD as Consulting Physician (Endocrinology) Teena Irani, PTA as Physical Therapy Assistant (Physical Therapy) Gean Birchwood, DPM as Consulting Physician (Podiatry) Carlean Purl, MD as Referring Physician (Otolaryngology)    Assessment:   This is a routine wellness examination for Kanisha.  Exercise Activities and Dietary recommendations Current Exercise Habits: Home exercise routine, Type of exercise: walking, Time (Minutes): 10, Frequency (Times/Week): 5, Weekly Exercise (Minutes/Week): 50, Intensity: Mild, Exercise limited by: orthopedic condition(s)  Goals    . Exercise 3x per week (30 min per time)     Patient would like to increase her  exercise to 30 minutes a day for at least 4 days a week.       Fall Risk Fall Risk  05/16/2017 04/28/2017 01/25/2017 10/21/2016 07/06/2016  Falls in the past year? No No No No No  Risk for fall due to : - - - Impaired balance/gait -   Is the patient's home free of loose throw rugs in walkways, pet beds, electrical cords, etc?   no      Grab bars in the bathroom? yes      Handrails on the stairs?   No stairs in home      Adequate lighting?   yes   Depression Screen PHQ 2/9 Scores 05/16/2017 04/28/2017 01/25/2017 10/21/2016  PHQ - 2 Score 0 0 0 0     Cognitive Function MMSE - Mini Mental State Exam 01/13/2016  Orientation to time 5  Orientation to Place 5  Registration 3  Attention/ Calculation 5  Recall 3  Language- name 2 objects 2  Language- repeat 1  Language- follow 3 step command 3  Language- read & follow direction 1  Write a sentence 1  Copy design 1  Total score 30     6CIT Screen 04/28/2016  What Year? 0 points  What month? 0 points  What time? 0 points  Count back from 20 0 points  Months in reverse 0 points  Repeat phrase 0 points  Total Score 0    Immunization History  Administered Date(s) Administered  . Influenza Split 02/15/2012  . Influenza Whole 01/06/2010  . Influenza,inj,Quad PF,6+ Mos 05/14/2013, 01/17/2014, 04/03/2015, 12/17/2015, 11/24/2016  . Pneumococcal Conjugate-13 08/08/2014  . Pneumococcal Polysaccharide-23 10/13/2009, 08/12/2015  . Tdap 08/24/2005  . Zoster 10/23/2012    Qualifies for Shingles Vaccine? She has had Zostavax. Updating with Shingrix was discussed in the office. She will discuss the need fro this with Dr. Moshe Cipro  Screening Tests Health Maintenance  Topic Date Due  . TETANUS/TDAP  04/13/2018 (Originally 08/25/2015)  . FOOT EXAM  08/30/2017  . HEMOGLOBIN A1C  10/18/2017  . OPHTHALMOLOGY EXAM  11/23/2017  . MAMMOGRAM  09/21/2018  . PAP SMEAR  01/05/2019  . COLONOSCOPY  06/30/2020  . PNA vac Low Risk Adult (2 of 2 -  PPSV23) 08/11/2020  . INFLUENZA VACCINE  Completed  . DEXA SCAN  Completed  . Hepatitis C Screening  Completed  . HIV Screening  Completed    Cancer Screenings: Lung: Low Dose CT Chest recommended if Age 70-80 years, 30 pack-year currently smoking OR have quit w/in 15years. Patient does not qualify. Breast:  Up to date on Mammogram? Yes   Up to date of Bone Density/Dexa? Yes Colorectal: She will discuss the need for a colonoscopy with PCP/GI      Plan:      I have personally reviewed and noted the following in the patient's chart:   . Medical and social history . Use of alcohol, tobacco or illicit drugs  . Current medications and supplements . Functional ability and status . Nutritional status . Physical activity . Advanced directives . List of other physicians . Hospitalizations, surgeries, and ER visits in previous 12 months . Vitals . Screenings to include cognitive, depression, and falls . Referrals and appointments  In addition, I have reviewed and discussed with patient certain preventive protocols, quality metrics, and best practice recommendations. A written personalized care plan for preventive services as well as general preventive health recommendations were provided to patient.     Merceda Elks, LPN  09/23/3084

## 2017-05-20 ENCOUNTER — Other Ambulatory Visit: Payer: Self-pay | Admitting: Family Medicine

## 2017-05-20 DIAGNOSIS — L84 Corns and callosities: Secondary | ICD-10-CM | POA: Diagnosis not present

## 2017-05-20 DIAGNOSIS — E119 Type 2 diabetes mellitus without complications: Secondary | ICD-10-CM | POA: Diagnosis not present

## 2017-05-20 DIAGNOSIS — B351 Tinea unguium: Secondary | ICD-10-CM | POA: Diagnosis not present

## 2017-05-26 ENCOUNTER — Other Ambulatory Visit: Payer: Self-pay

## 2017-05-26 DIAGNOSIS — E119 Type 2 diabetes mellitus without complications: Secondary | ICD-10-CM | POA: Diagnosis not present

## 2017-05-26 MED ORDER — NYSTATIN 100000 UNIT/GM EX POWD: CUTANEOUS | 1 refills | Status: DC

## 2017-06-03 DIAGNOSIS — E1165 Type 2 diabetes mellitus with hyperglycemia: Secondary | ICD-10-CM | POA: Diagnosis not present

## 2017-06-03 DIAGNOSIS — N183 Chronic kidney disease, stage 3 (moderate): Secondary | ICD-10-CM | POA: Diagnosis not present

## 2017-06-03 DIAGNOSIS — E1140 Type 2 diabetes mellitus with diabetic neuropathy, unspecified: Secondary | ICD-10-CM | POA: Diagnosis not present

## 2017-06-03 DIAGNOSIS — I129 Hypertensive chronic kidney disease with stage 1 through stage 4 chronic kidney disease, or unspecified chronic kidney disease: Secondary | ICD-10-CM | POA: Diagnosis not present

## 2017-06-03 DIAGNOSIS — E1169 Type 2 diabetes mellitus with other specified complication: Secondary | ICD-10-CM | POA: Diagnosis not present

## 2017-06-03 DIAGNOSIS — Z794 Long term (current) use of insulin: Secondary | ICD-10-CM | POA: Diagnosis not present

## 2017-06-10 ENCOUNTER — Other Ambulatory Visit: Payer: Self-pay

## 2017-06-10 MED ORDER — CHLORTHALIDONE 25 MG PO TABS: 25.0000 mg | ORAL_TABLET | Freq: Every day | ORAL | 3 refills | Status: DC

## 2017-06-10 NOTE — Telephone Encounter (Signed)
Refilled to [facility] order spironolactone

## 2017-06-16 ENCOUNTER — Other Ambulatory Visit: Payer: Self-pay

## 2017-06-16 MED ORDER — CHLORTHALIDONE 25 MG PO TABS: 25.0000 mg | ORAL_TABLET | Freq: Every day | ORAL | 3 refills | Status: DC

## 2017-06-16 NOTE — Telephone Encounter (Signed)
E-scribed chlorthalidone to [facility] mail order

## 2017-06-29 ENCOUNTER — Other Ambulatory Visit: Payer: Self-pay | Admitting: Family Medicine

## 2017-07-04 ENCOUNTER — Other Ambulatory Visit: Payer: Self-pay

## 2017-07-04 ENCOUNTER — Emergency Department (HOSPITAL_COMMUNITY)
Admission: EM | Admit: 2017-07-04 | Discharge: 2017-07-04 | Disposition: A | Discharge status: Home / Self Care | Payer: Medicare HMO | Attending: Emergency Medicine | Admitting: Emergency Medicine

## 2017-07-04 ENCOUNTER — Encounter (HOSPITAL_COMMUNITY): Payer: Self-pay | Admitting: Emergency Medicine

## 2017-07-04 DIAGNOSIS — M545 Low back pain, unspecified: Secondary | ICD-10-CM

## 2017-07-04 DIAGNOSIS — I10 Essential (primary) hypertension: Secondary | ICD-10-CM | POA: Diagnosis not present

## 2017-07-04 DIAGNOSIS — R109 Unspecified abdominal pain: Secondary | ICD-10-CM | POA: Diagnosis not present

## 2017-07-04 LAB — URINALYSIS, ROUTINE W REFLEX MICROSCOPIC
Bilirubin Urine: NEGATIVE
Glucose, UA: NEGATIVE mg/dL
Hgb urine dipstick: NEGATIVE
Ketones, ur: NEGATIVE mg/dL
Leukocytes, UA: NEGATIVE
Nitrite: NEGATIVE
Protein, ur: NEGATIVE mg/dL
Specific Gravity, Urine: 1.005 (ref 1.005–1.030)
pH: 5 (ref 5.0–8.0)

## 2017-07-04 MED ORDER — TRAMADOL HCL 50 MG PO TABS: 50.0000 mg | ORAL_TABLET | Freq: Four times a day (QID) | ORAL | 0 refills | Status: DC | PRN

## 2017-07-04 MED ORDER — IBUPROFEN 400 MG PO TABS: 400.0000 mg | ORAL_TABLET | Freq: Four times a day (QID) | ORAL | 0 refills | Status: DC | PRN

## 2017-07-04 NOTE — Discharge Instructions (Signed)
Take 400mg  of ibuprofen every 6 hours as needed for pain. Use tramadol as needed if you are not getting enough improvement from the the ibuprofen.

## 2017-07-04 NOTE — ED Provider Notes (Signed)
[facility] EMERGENCY DEPARTMENT Provider Note   CSN: [id number] Arrival date & time: 07/04/17  4562     History   Chief Complaint Chief Complaint  Patient presents with  . Flank Pain    HPI Christine Dominguez is a 67 y.o. female.  HPI   67 year old female with right lower back pain.  Gradual onset about 4 days ago.  Denies any trauma.  Pain is been persistent since then.  Worse with certain movements such as walking and turning at the waist.  Denies any radiation.  No acute numbness, tingling or focal loss of strength.  No acute urinary complaints.  Past Medical History:  Diagnosis Date  . Anemia   . Arthritis    Cervical spine  . Cancer ([place]) 1998, recurred in 2012   Paraganglioma of jugular tympamicum, recurrent in 2012  . Chronic back pain 2007   Disabled   . Essential hypertension, benign 1980  . GERD (gastroesophageal reflux disease) 2000  . Hydrocephalus    Treated with VP shunt and then intracranial tumor resection (?Cholesteatoma); no longer followed actively by neurosurgery or otolaryngology  . Hyperlipidemia 2013  . Hypothyroidism 2013  . Neuropathy   . Obesity   . Type 2 diabetes mellitus ([place]) 1991    Patient Active Problem List   Diagnosis Date Noted  . Essential hypertension, benign 04/28/2017  . Hand pain, right 01/15/2017  . Chronic back pain greater than 3 months duration 11/27/2016  . Rectocele 12/31/2013  . Paraganglioma ([place]) 08/01/2013  . Primary hypothyroidism 11/14/2011  . Mixed hyperlipidemia 11/14/2011  . GERD (gastroesophageal reflux disease) 11/03/2011  . Hearing loss in right ear 11/24/2010  . Diabetes mellitus with stage 3 chronic kidney disease ([place]) 06/11/2010  . Back pain 11/18/2009  . CARPAL TUNNEL SYNDROME 10/19/2009  . Malignant hypertension 10/13/2009    Past Surgical History:  Procedure Laterality Date  . CATARACT EXTRACTION W/PHACO Right 07/17/2013   Procedure: CATARACT EXTRACTION PHACO AND INTRAOCULAR LENS  PLACEMENT (IOC);  Surgeon: [name], MD;  Location: [facility];  Service: Ophthalmology;  Laterality: Right;  CDE:7.24  . COLONOSCOPY  06/2010  . Hemorrhoidectomy    . Intracranial mass resected  1998   At [facility]; ? location-right middle ear  . Teeth pulled    . TRIGGER FINGER RELEASE     Rght thumb  . TUBAL LIGATION  1980  . TUBAL LIGATION    . VENTRICULOPERITONEAL SHUNT  1998   At [facility]     OB History    Gravida  3   Para  2   Term      Preterm      AB  1   Living  1     SAB      TAB      Ectopic  1   Multiple      Live Births  1            Home Medications    Prior to Admission medications   Medication Sig Start Date End Date Taking? Authorizing Provider  ACCU-CHEK FASTCLIX LANCETS MISC TEST BLOOD SUGAR FOUR TIMES DAILY 02/22/17   Cassandria Anger, MD  Alcohol Swabs (B-D SINGLE USE SWABS REGULAR) PADS TEST FOUR TIMES A DAY 02/22/17   Cassandria Anger, MD  aspirin EC 81 MG tablet Take 81 mg by mouth at bedtime.    [provider]  B-D ULTRAFINE III SHORT PEN 31G X 8 MM MISC USE  TO INJECT EVERY DAY  WITH  LANTUS 11/18/16   Cassandria Anger, MD  chlorthalidone (HYGROTON) 25 MG tablet Take 1 tablet (25 mg total) by mouth daily. 06/16/17 09/14/17  Satira Sark, MD  cloNIDine (CATAPRES) 0.1 MG tablet One and a half tablets at 6 am, then at 2 pm ,and two tablets at 10 pm 04/13/17   [name], MD  docusate sodium (STOOL SOFTENER) 100 MG capsule Take 100 mg by mouth as needed for mild constipation.    [provider]  ferrous sulfate 325 (65 FE) MG EC tablet Take 325 mg by mouth daily.    [provider]  gabapentin (NEURONTIN) 100 MG capsule Take 1 capsule (100 mg total) by mouth 2 (two) times daily. 11/17/16   [name], MD  glucose [facility] GLUCOSE) 4 GM chewable tablet Chew by mouth as needed.  07/29/11   [provider]  glucose blood (ACCU-CHEK SMARTVIEW) test strip TEST 4 TIMES DAILY  03/18/17   Cassandria Anger, MD  Insulin Glargine (LANTUS SOLOSTAR) 100 UNIT/ML Solostar Pen INJECT 70 UNITS INTO THE SKIN AT BEDTIME 04/28/17   Cassandria Anger, MD  levothyroxine (SYNTHROID, LEVOTHROID) 88 MCG tablet TAKE 1 TABLET EVERY DAY BEFORE BREAKFAST 05/06/17   Cassandria Anger, MD  nystatin (MYCOSTATIN/NYSTOP) powder APPLY TOPICALLY TO RASH ON LOWER ABDOMEN AT BEDTIME AS NEEDED 05/26/17   [name], MD  olmesartan (BENICAR) 40 MG tablet Take 1 tablet (40 mg total) by mouth daily. 11/24/16   [name], MD  potassium chloride (K-DUR) 10 MEQ tablet Take 1 tablet (10 mEq total) by mouth 2 (two) times daily. 02/28/17   [name], MD  Simethicone (GAS RELIEF DROPS PO) Take by mouth as needed.    [provider]  simvastatin (ZOCOR) 10 MG tablet TAKE 1 TABLET AT BEDTIME 06/29/17   [name], MD  sodium chloride (OCEAN) 0.65 % nasal spray 1 spray as needed.  12/04/13   [provider]  TRADJENTA 5 MG TABS tablet TAKE 1 TABLET EVERY DAY 01/26/17   Cassandria Anger, MD    Family History Family History  Problem Relation Age of Onset  . Lung cancer Mother   . Hypertension Mother 4  . Hypertension Brother   . Diabetes Brother   . Diabetes Sister   . Kidney failure Brother   . Hypertension Maternal Grandmother   . Heart disease Maternal Grandmother   . Other Son        stillborn  . Other Son        died at birth  . Diabetes Maternal Grandfather   . Other Son        was murdered    Social History Social History   Tobacco Use  . Smoking status: Former Smoker    Packs/day: 0.50    Years: 15.00    Pack years: 7.50    Types: Cigarettes    Last attempt to quit: 07/07/2001    Years since quitting: 16.0  . Smokeless tobacco: Never Used  Substance Use Topics  . Alcohol use: No    Alcohol/week: 0.0 oz  . Drug use: No     Allergies   Amlodipine; Amaryl; Avandia [rosiglitazone maleate]; Glimepiride; Glipizide;  Invokana [canagliflozin]; Lyrica [pregabalin]; Novolog [insulin aspart]; Rosiglitazone; Sitagliptin; and Aspirin   Review of Systems Review of Systems  All systems reviewed and negative, other than as noted in HPI.  Physical Exam Updated Vital Signs BP (!) 171/81 (BP Location: Right Arm)  Pulse 84   Temp 98.4 F (36.9 C) (Oral)   Resp 17   SpO2 100%   Physical Exam  Constitutional: She appears well-developed and well-nourished. No distress.  HENT:  Head: Normocephalic and atraumatic.  Eyes: Conjunctivae are normal. Right eye exhibits no discharge. Left eye exhibits no discharge.  Neck: Neck supple.  Cardiovascular: Normal rate, regular rhythm and normal heart sounds. Exam reveals no gallop and no friction rub.  No murmur heard. Pulmonary/Chest: Effort normal and breath sounds normal. No respiratory distress.  Abdominal: Soft. She exhibits no distension. There is no tenderness.  Musculoskeletal: She exhibits no edema or tenderness.  Tenderness palpation the right lower back and mid to upper lumbar region.  No midline spinal tenderness.  No concerning skin changes noted.  Positive straight leg test on the right.  Strength is 5 out of 5 bilateral lower extremities.  Sensation is intact to light touch.  Palpable DP pulse.  Neurological: She is alert.  Skin: Skin is warm and dry.  Psychiatric: She has a normal mood and affect. Her behavior is normal. Thought content normal.  Nursing note and vitals reviewed.    ED Treatments / Results  Labs (all labs ordered are listed, but only abnormal results are displayed) Labs Reviewed  URINALYSIS, ROUTINE W REFLEX MICROSCOPIC - Abnormal; Notable for the following components:      Result Value   Color, Urine STRAW (*)    All other components within normal limits    EKG None  Radiology No results found.  Procedures Procedures (including critical care time)  Medications Ordered in ED Medications - No data to display   Initial  Impression / Assessment and Plan / ED Course  I have reviewed the triage vital signs and the nursing notes.  Pertinent labs & imaging results that were available during my care of the patient were reviewed by me and considered in my medical decision making (see chart for details).     67 year old female who likely has musculoskeletal lower back pain.  No GU complaints.  UA looks fine.  She has a nonfocal neurological examination.  Plan symptomatic treatment.  Return precautions were discussed.  Outpatient follow-up otherwise.  Final Clinical Impressions(s) / ED Diagnoses   Final diagnoses:  Acute right-sided low back pain without sciatica    ED Discharge Orders    None       Virgel Manifold, MD 07/05/17 1219

## 2017-07-04 NOTE — ED Triage Notes (Signed)
Pt c/o right flank pain x 4 days. Denies gu sx. Denies v/d. C/o nausea. lnbm this am, denies black or bloody. Laying/sitting down makes pain worse

## 2017-07-06 ENCOUNTER — Ambulatory Visit (INDEPENDENT_AMBULATORY_CARE_PROVIDER_SITE_OTHER): Payer: Medicare HMO | Admitting: Family Medicine

## 2017-07-06 ENCOUNTER — Other Ambulatory Visit: Payer: Self-pay

## 2017-07-06 ENCOUNTER — Encounter: Payer: Self-pay | Admitting: Family Medicine

## 2017-07-06 VITALS — BP 160/60 | HR 96 | Temp 98.0°F | Resp 16 | Ht 61.5 in | Wt 167.5 lb

## 2017-07-06 DIAGNOSIS — M5440 Lumbago with sciatica, unspecified side: Secondary | ICD-10-CM

## 2017-07-06 NOTE — Patient Instructions (Signed)
You are doing well Stretch and exercise to tolerance every day  Take the tramadol for pain Caution drowsiness and constipation  Take the muscle relaxer as needed  Use ice or heat to area  Follow up with Dr Moshe Cipro as scheduled

## 2017-07-06 NOTE — Progress Notes (Signed)
Chief Complaint  Patient presents with  . Flank Pain   Patient is here for emergency room follow-up.  She was seen in the emergency room on July 04, 2017 for right flank and back pain.  She was worried about kidney infection.  No dysuria or frequency, no nausea or vomiting, no fever or chills. She was diagnosed as having low back pain with right sciatica.  She has a history of chronic low back pain from degenerative disc disease and "arthritis".  She has had extensive physical therapy for her back.  She knows stretching exercises to do for her back. She was prescribed ibuprofen and a an unknown muscle relaxer.  She also has tramadol for pain.  She states that in the last 2 days her pain is improved.  She now complete full weight on her right leg.  She is here for follow-up at the request of the emergency department.  The emergency room records are reviewed and discussed with patient.   Patient Active Problem List   Diagnosis Date Noted  . Essential hypertension, benign 04/28/2017  . Hand pain, right 01/15/2017  . Chronic back pain greater than 3 months duration 11/27/2016  . Rectocele 12/31/2013  . 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 Primary hypothyroidism 11/14/2011  . Mixed hyperlipidemia 11/14/2011  . GERD (gastroesophageal reflux disease) 11/03/2011  . Hearing loss in right ear 11/24/2010  . Diabetes mellitus with stage 3 chronic kidney disease ([place]) 06/11/2010  . Back pain 11/18/2009  . CARPAL TUNNEL SYNDROME 10/19/2009  . Malignant hypertension 10/13/2009    Outpatient Encounter Medications as of 07/06/2017  Medication Sig  . ACCU-CHEK FASTCLIX LANCETS MISC TEST BLOOD SUGAR FOUR TIMES DAILY  . Alcohol Swabs (B-D SINGLE USE SWABS REGULAR) PADS TEST FOUR TIMES A DAY  . aspirin EC 81 MG tablet Take 81 mg by mouth at bedtime.  . B-D ULTRAFINE III SHORT PEN 31G X 8 MM MISC USE  TO INJECT EVERY DAY  WITH  LANTUS  . chlorthalidone (HYGROTON) 25 MG tablet Take 1 tablet (25 mg total)  by mouth daily.  . cloNIDine (CATAPRES) 0.1 MG tablet One and a half tablets at 6 am, then at 2 pm ,and two tablets at 10 pm  . docusate sodium (STOOL SOFTENER) 100 MG capsule Take 100 mg by mouth as needed for mild constipation.  . ferrous sulfate 325 (65 FE) MG EC tablet Take 325 mg by mouth daily.  Marland Kitchen gabapentin (NEURONTIN) 100 MG capsule Take 1 capsule (100 mg total) by mouth 2 (two) times daily.  Marland Kitchen glucose (SUNMARK GLUCOSE) 4 GM chewable tablet Chew by mouth as needed.   Marland Kitchen glucose blood (ACCU-CHEK SMARTVIEW) test strip TEST 4 TIMES DAILY  . ibuprofen (ADVIL,MOTRIN) 400 MG tablet Take 1 tablet (400 mg total) by mouth every 6 (six) hours as needed.  . Insulin Glargine (LANTUS SOLOSTAR) 100 UNIT/ML Solostar Pen INJECT 70 UNITS INTO THE SKIN AT BEDTIME  . levothyroxine (SYNTHROID, LEVOTHROID) 88 MCG tablet TAKE 1 TABLET EVERY DAY BEFORE BREAKFAST  . nystatin (MYCOSTATIN/NYSTOP) powder APPLY TOPICALLY TO RASH ON LOWER ABDOMEN AT BEDTIME AS NEEDED  . olmesartan (BENICAR) 40 MG tablet Take 1 tablet (40 mg total) by mouth daily.  . potassium chloride (K-DUR) 10 MEQ tablet Take 1 tablet (10 mEq total) by mouth 2 (two) times daily.  . Simethicone (GAS RELIEF DROPS PO) Take by mouth as needed.  . simvastatin (ZOCOR) 10 MG tablet TAKE 1 TABLET AT BEDTIME  . sodium chloride (OCEAN) 0.65 %  nasal spray 1 spray as needed.   . TRADJENTA 5 MG TABS tablet TAKE 1 TABLET EVERY DAY  . traMADol (ULTRAM) 50 MG tablet Take 1 tablet (50 mg total) by mouth every 6 (six) hours as needed.   No facility-administered encounter medications on file as of 07/06/2017.     Allergies  Allergen Reactions  . Amlodipine Swelling    Itching and leg swelling  . Amaryl   . Avandia [Rosiglitazone Maleate]   . Glimepiride Itching  . Glipizide Itching  . Invokana [Canagliflozin]   . Lyrica [Pregabalin] Nausea And Vomiting  . Novolog [Insulin Aspart]   . Rosiglitazone     Other reaction(s): OTHER  . Sitagliptin     Other  reaction(s): OTHER  . Aspirin Nausea Only and Other (See Comments)    Other Reaction: GI Upset; CAN TAKE 81 mg More of a burning feeling when she take it Burning in stomach, nausea    Review of Systems  Constitutional: Negative for activity change, appetite change and unexpected weight change.  HENT: Negative for congestion and dental problem.   Eyes: Negative for photophobia and visual disturbance.  Respiratory: Negative for cough.   Cardiovascular: Negative for chest pain and palpitations.  Gastrointestinal: Negative for abdominal distention and abdominal pain.  Genitourinary: Negative for difficulty urinating, flank pain and frequency.  Musculoskeletal: Positive for back pain. Negative for gait problem.  Skin: Negative.  Negative for rash.  Neurological: Negative for dizziness, weakness, numbness and headaches.  Psychiatric/Behavioral: Negative for sleep disturbance. The patient is not nervous/anxious.     BP (!) 160/60 (BP Location: Left Arm, Patient Position: Sitting, Cuff Size: Normal)   Pulse 96   Temp 98 F (36.7 C) (Temporal)   Resp 16   Ht 5' 1.5" (1.562 m)   Wt 167 lb 8 oz (76 kg)   SpO2 97%   BMI 31.14 kg/m   Physical Exam  Constitutional: She is oriented to person, place, and time. She appears well-developed and well-nourished.  Pleasant.  Antalgic gait.  Mild face asymmetry.  No acute discomfort.  HENT:  Head: Normocephalic and atraumatic.  Eyes: Pupils are equal, round, and reactive to light. Conjunctivae are normal.  Neck: Normal range of motion. Neck supple.  Cardiovascular: Normal rate, regular rhythm and normal heart sounds.  Pulmonary/Chest: Effort normal. She has no wheezes.  Abdominal: Soft. Bowel sounds are normal. There is no tenderness.  Musculoskeletal:  Lumbar spine is straight and symmetric. Full functional range of motion.  Mild tenderness right lumbar muscles tenderness with no muscle spasm. Strength, sensation, range of motion, and reflexes  are normal in both lower extremities. Straight leg raise is negative bilateral.   Neurological: She is alert and oriented to person, place, and time.  Skin: Skin is warm and dry.  Psychiatric: She has a normal mood and affect. Her behavior is normal.    ASSESSMENT/PLAN:  1. Low back pain with sciatica, sciatica laterality unspecified, unspecified back pain laterality, unspecified chronicity- improved with conservative treatment   Patient Instructions  You are doing well Stretch and exercise to tolerance every day  Take the tramadol for pain Caution drowsiness and constipation  Take the muscle relaxer as needed  Use ice or heat to area  Follow up with Dr Moshe Cipro as scheduled    Raylene Everts, MD

## 2017-07-11 ENCOUNTER — Other Ambulatory Visit: Payer: Self-pay

## 2017-07-11 MED ORDER — POTASSIUM CHLORIDE ER 10 MEQ PO TBCR: 10.0000 meq | EXTENDED_RELEASE_TABLET | Freq: Two times a day (BID) | ORAL | 3 refills | Status: AC

## 2017-07-27 ENCOUNTER — Ambulatory Visit (INDEPENDENT_AMBULATORY_CARE_PROVIDER_SITE_OTHER): Payer: Medicare HMO | Admitting: Family Medicine

## 2017-07-27 ENCOUNTER — Encounter: Payer: Self-pay | Admitting: Family Medicine

## 2017-07-27 VITALS — BP 164/82 | HR 76 | Resp 16 | Ht 62.0 in | Wt 169.0 lb

## 2017-07-27 DIAGNOSIS — I10 Essential (primary) hypertension: Secondary | ICD-10-CM

## 2017-07-27 DIAGNOSIS — Z1231 Encounter for screening mammogram for malignant neoplasm of breast: Secondary | ICD-10-CM | POA: Diagnosis not present

## 2017-07-27 DIAGNOSIS — G8929 Other chronic pain: Secondary | ICD-10-CM

## 2017-07-27 DIAGNOSIS — E039 Hypothyroidism, unspecified: Secondary | ICD-10-CM | POA: Diagnosis not present

## 2017-07-27 DIAGNOSIS — N183 Chronic kidney disease, stage 3 unspecified: Secondary | ICD-10-CM

## 2017-07-27 DIAGNOSIS — E1122 Type 2 diabetes mellitus with diabetic chronic kidney disease: Secondary | ICD-10-CM | POA: Diagnosis not present

## 2017-07-27 DIAGNOSIS — E782 Mixed hyperlipidemia: Secondary | ICD-10-CM | POA: Diagnosis not present

## 2017-07-27 DIAGNOSIS — M5441 Lumbago with sciatica, right side: Secondary | ICD-10-CM

## 2017-07-27 LAB — LIPID PANEL
CHOLESTEROL: 104 mg/dL (ref <200)
HDL: 38 mg/dL — ABNORMAL LOW (ref >50)
LDL CHOLESTEROL (CALC): 47 mg/dL
Non-HDL Cholesterol (Calc): 66 mg/dL (calc) (ref <130)
Total CHOL/HDL Ratio: 2.7 (calc) (ref <5.0)
Triglycerides: 107 mg/dL (ref <150)

## 2017-07-27 LAB — TSH: TSH: 0.29 m[IU]/L — AB (ref 0.40–4.50)

## 2017-07-27 LAB — CBC
HCT: 32 % — ABNORMAL LOW (ref 35.0–45.0)
Hemoglobin: 10.5 g/dL — ABNORMAL LOW (ref 11.7–15.5)
MCH: 26.8 pg — AB (ref 27.0–33.0)
MCHC: 32.8 g/dL (ref 32.0–36.0)
MCV: 81.6 fL (ref 80.0–100.0)
MPV: 11.2 fL (ref 7.5–12.5)
PLATELETS: 218 10*3/uL (ref 140–400)
RBC: 3.92 10*6/uL (ref 3.80–5.10)
RDW: 12.7 % (ref 11.0–15.0)
WBC: 7.9 10*3/uL (ref 3.8–10.8)

## 2017-07-27 LAB — COMPLETE METABOLIC PANEL WITH GFR
AG Ratio: 1.2 (calc) (ref 1.0–2.5)
ALBUMIN MSPROF: 3.8 g/dL (ref 3.6–5.1)
ALKALINE PHOSPHATASE (APISO): 65 U/L (ref 33–130)
ALT: 15 U/L (ref 6–29)
AST: 12 U/L (ref 10–35)
BUN / CREAT RATIO: 16 (calc) (ref 6–22)
BUN: 20 mg/dL (ref 7–25)
CO2: 28 mmol/L (ref 20–32)
Calcium: 9.1 mg/dL (ref 8.6–10.4)
Chloride: 106 mmol/L (ref 98–110)
Creat: 1.27 mg/dL — ABNORMAL HIGH (ref 0.50–0.99)
GFR, EST AFRICAN AMERICAN: 51 mL/min/{1.73_m2} — AB (ref >=60)
GFR, EST NON AFRICAN AMERICAN: 44 mL/min/{1.73_m2} — AB (ref >=60)
GLOBULIN: 3.3 g/dL (ref 1.9–3.7)
Glucose, Bld: 132 mg/dL (ref 65–139)
Potassium: 4.1 mmol/L (ref 3.5–5.3)
SODIUM: 139 mmol/L (ref 135–146)
TOTAL PROTEIN: 7.1 g/dL (ref 6.1–8.1)
Total Bilirubin: 0.3 mg/dL (ref 0.2–1.2)

## 2017-07-27 NOTE — Patient Instructions (Addendum)
Physical Exam with MD end [place] or early August, call if you need me sooner  Please schedule mammogram due 6/19 or after at checkout Please cancel appointment with Dr Dorris Fetch, I not already done, the patient has established with a new Doctor  Do not lift heavy objects or do excessive housework to strain your back  Use medications that you already have for uncontrolled back pain and do home back strengthening exercises you were taught atphysical therapy   Labs today HBA1C, cmp and EGFR, TSH, lipid, CBC, please come next week Monday and collect a copy of the labs in an envelope to take  your diabetic Doctor's ap[pointment

## 2017-07-28 ENCOUNTER — Encounter: Payer: Self-pay | Admitting: Family Medicine

## 2017-07-28 LAB — HEMOGLOBIN A1C
EAG (MMOL/L): 11.1 (calc)
HEMOGLOBIN A1C: 8.6 %{Hb} — AB (ref <5.7)
MEAN PLASMA GLUCOSE: 200 (calc)

## 2017-07-29 ENCOUNTER — Ambulatory Visit: Payer: Medicare HMO | Admitting: "Endocrinology

## 2017-07-31 ENCOUNTER — Encounter: Payer: Self-pay | Admitting: Family Medicine

## 2017-07-31 NOTE — Assessment & Plan Note (Signed)
Uncontrolled but slightly improved, now being followed by endo in [place] and has upcoming appt next week Christine Dominguez is reminded of the importance of commitment to daily physical activity for 30 minutes or more, as able and the need to limit carbohydrate intake to 30 to 60 grams per meal to help with blood sugar control.   The need to take medication as prescribed, test blood sugar as directed, and to call between visits if there is a concern that blood sugar is uncontrolled is also discussed.   Christine Dominguez is reminded of the importance of daily foot exam, annual eye examination, and good blood sugar, blood pressure and cholesterol control.  Diabetic Labs Latest Ref Rng & Units 07/27/2017 04/20/2017 01/18/2017 10/13/2016 08/19/2016  HbA1c <5.7 % of total Hgb 8.6(H) 9.0(H) 8.7(H) 8.9(H) -  Microalbumin Not estab mg/dL - - - - -  Micro/Creat Ratio <30 mcg/mg creat - - - - -  Chol <200 mg/dL 104 - 109 - 106  HDL >50 mg/dL 38(L) - 37(L) - 37(L)  Calc LDL mg/dL (calc) 47 - 50 - 48  Triglycerides <150 mg/dL 107 - 139 - 106  Creatinine 0.50 - 0.99 mg/dL 1.27(H) 1.37(H) 1.49(H) 1.44(H) 1.41(H)   BP/Weight 07/27/2017 07/06/2017 07/04/2017 05/16/2017 05/11/2017 3/54/5625 09/06/8935  Systolic BP 342 876 811 572 620 355 974  Diastolic BP 82 60 81 70 90 93 80  Wt. (Lbs) 169 167.5 - 166 166 168 165  BMI 30.91 31.14 - 30.86 30.86 31.23 30.67   Foot/eye exam completion dates Latest Ref Rng & Units 08/26/2016 11/25/2015  Eye Exam No Retinopathy - No Retinopathy  Foot exam Order - - -  Foot Form Completion - Done -

## 2017-07-31 NOTE — Assessment & Plan Note (Signed)
Hyperlipidemia:Low fat diet discussed and encouraged.   Lipid Panel  Lab Results  Component Value Date   CHOL 104 07/27/2017   HDL 38 (L) 07/27/2017   LDLCALC 47 07/27/2017   TRIG 107 07/27/2017   CHOLHDL 2.7 07/27/2017   Increased exercise  As able encouraged, no change in medication

## 2017-07-31 NOTE — Assessment & Plan Note (Signed)
Medication management through endo adjustment in medication likely based on recent lab appears over corrected

## 2017-07-31 NOTE — Progress Notes (Signed)
Christine Dominguez     MRN: [id number]      DOB: 1950-08-06   HPI Christine Dominguez is here for follow up and re-evaluation of chronic medical conditions, medication management and review of any available recent lab and radiology data.  Preventive health is updated, specifically  Cancer screening and Immunization.   She was in the ED on 04/01 for acute sciatica, was seen in the office on 04/03 and is again here reporting continued improvement in her back pain. States she is barely able to do any house work without having a flare of pain but for the most part she has learned to live with it The PT denies any adverse reactions to current medications since the last visit.  She reports improved blood sugars, states she has less episodes of blood sugar lows and has an upcoming appointment with her new endo, so I will order her labs which she will have to take with her since they are due    ROS Denies recent fever or chills. Denies sinus pressure, nasal congestion, ear pain or sore throat. Denies chest congestion, productive cough or wheezing. Denies chest pains, palpitations and leg swelling Denies abdominal pain, nausea, vomiting,diarrhea or constipation.   Denies dysuria, frequency, hesitancy or incontinence.  Denies headaches, seizures, c/o lower extremity  numbness, and  tingling. Denies depression, anxiety or insomnia. Denies skin break down or rash.   PE  BP (!) 164/82   Pulse 76   Resp 16   Ht 5\' 2"  (1.575 m)   Wt 169 lb (76.7 kg)   SpO2 98%   BMI 30.91 kg/m   Patient alert and oriented and in no cardiopulmonary distress.  HEENT: No facial asymmetry, EOMI,   oropharynx pink and moist.  Neck supple no JVD, no mass.  Chest: Clear to auscultation bilaterally.  CVS: S1, S2 no murmurs, no S3.Regular rate.  ABD: Soft non tender.   Ext: No edema  MS: decreased  ROM lumbar  Spine,adequate in  shoulders, hips and knees.  Skin: Intact, no ulcerations or rash  noted.  Psych: Good eye contact, normal affect. Memory intact not anxious or depressed appearing.  CNS: CN 2-12 intact, power,  normal throughout.no focal deficits noted.   Assessment & Plan  Malignant hypertension Improved control though not at goal, no medication change  DASH diet and commitment to daily physical activity for a minimum of 30 minutes discussed and encouraged, as a part of hypertension management. The importance of attaining a healthy weight is also discussed.  BP/Weight 07/27/2017 07/06/2017 07/04/2017 05/16/2017 05/11/2017 1/32/4401 0/05/7251  Systolic BP 664 403 474 259 563 875 643  Diastolic BP 82 60 81 70 90 93 80  Wt. (Lbs) 169 167.5 - 166 166 168 165  BMI 30.91 31.14 - 30.86 30.86 31.23 30.67       Diabetes mellitus with stage 3 chronic kidney disease ([facility]) Uncontrolled but slightly improved, now being followed by endo in [place] and has upcoming appt next week Christine Dominguez is reminded of the importance of commitment to daily physical activity for 30 minutes or more, as able and the need to limit carbohydrate intake to 30 to 60 grams per meal to help with blood sugar control.   The need to take medication as prescribed, test blood sugar as directed, and to call between visits if there is a concern that blood sugar is uncontrolled is also discussed.   Christine Dominguez is reminded of the importance of daily foot exam, annual eye  examination, and good blood sugar, blood pressure and cholesterol control.  Diabetic Labs Latest Ref Rng & Units 07/27/2017 04/20/2017 01/18/2017 10/13/2016 08/19/2016  HbA1c <5.7 % of total Hgb 8.6(H) 9.0(H) 8.7(H) 8.9(H) -  Microalbumin Not estab mg/dL - - - - -  Micro/Creat Ratio <30 mcg/mg creat - - - - -  Chol <200 mg/dL 104 - 109 - 106  HDL >50 mg/dL 38(L) - 37(L) - 37(L)  Calc LDL mg/dL (calc) 47 - 50 - 48  Triglycerides <150 mg/dL 107 - 139 - 106  Creatinine 0.50 - 0.99 mg/dL 1.27(H) 1.37(H) 1.49(H) 1.44(H) 1.41(H)    BP/Weight 07/27/2017 07/06/2017 07/04/2017 05/16/2017 05/11/2017 04/20/5788 06/10/3336  Systolic BP 329 191 660 600 459 977 414  Diastolic BP 82 60 81 70 90 93 80  Wt. (Lbs) 169 167.5 - 166 166 168 165  BMI 30.91 31.14 - 30.86 30.86 31.23 30.67   Foot/eye exam completion dates Latest Ref Rng & Units 08/26/2016 11/25/2015  Eye Exam No Retinopathy - No Retinopathy  Foot exam Order - - -  Foot Form Completion - Done -        Primary hypothyroidism Medication management through endo adjustment in medication likely based on recent lab appears over corrected  Mixed hyperlipidemia Hyperlipidemia:Low fat diet discussed and encouraged.   Lipid Panel  Lab Results  Component Value Date   CHOL 104 07/27/2017   HDL 38 (L) 07/27/2017   LDLCALC 47 07/27/2017   TRIG 107 07/27/2017   CHOLHDL 2.7 07/27/2017   Increased exercise  As able encouraged, no change in medication    Back pain Encouraged to continue home exercises for back strengthening and to modify activities to limit injury to back

## 2017-07-31 NOTE — Assessment & Plan Note (Signed)
Improved control though not at goal, no medication change  DASH diet and commitment to daily physical activity for a minimum of 30 minutes discussed and encouraged, as a part of hypertension management. The importance of attaining a healthy weight is also discussed.  BP/Weight 07/27/2017 07/06/2017 07/04/2017 05/16/2017 05/11/2017 0/21/1155 2/0/8022  Systolic BP 336 122 449 753 005 110 211  Diastolic BP 82 60 81 70 90 93 80  Wt. (Lbs) 169 167.5 - 166 166 168 165  BMI 30.91 31.14 - 30.86 30.86 31.23 30.67

## 2017-07-31 NOTE — Assessment & Plan Note (Signed)
Encouraged to continue home exercises for back strengthening and to modify activities to limit injury to back

## 2017-08-05 DIAGNOSIS — E1122 Type 2 diabetes mellitus with diabetic chronic kidney disease: Secondary | ICD-10-CM | POA: Diagnosis not present

## 2017-08-05 DIAGNOSIS — E1140 Type 2 diabetes mellitus with diabetic neuropathy, unspecified: Secondary | ICD-10-CM | POA: Diagnosis not present

## 2017-08-05 DIAGNOSIS — E039 Hypothyroidism, unspecified: Secondary | ICD-10-CM | POA: Diagnosis not present

## 2017-08-05 DIAGNOSIS — Z794 Long term (current) use of insulin: Secondary | ICD-10-CM | POA: Diagnosis not present

## 2017-08-05 DIAGNOSIS — N183 Chronic kidney disease, stage 3 (moderate): Secondary | ICD-10-CM | POA: Diagnosis not present

## 2017-08-12 DIAGNOSIS — B351 Tinea unguium: Secondary | ICD-10-CM | POA: Diagnosis not present

## 2017-08-12 DIAGNOSIS — E119 Type 2 diabetes mellitus without complications: Secondary | ICD-10-CM | POA: Diagnosis not present

## 2017-08-12 DIAGNOSIS — L84 Corns and callosities: Secondary | ICD-10-CM | POA: Diagnosis not present

## 2017-09-15 ENCOUNTER — Telehealth: Payer: Self-pay | Admitting: Family Medicine

## 2017-09-15 NOTE — Telephone Encounter (Signed)
Pt is calling with Back pain discomfort--wants to know if you will call in some Tramadol

## 2017-09-16 ENCOUNTER — Telehealth: Payer: Self-pay | Admitting: Family Medicine

## 2017-09-16 ENCOUNTER — Encounter: Payer: Self-pay | Admitting: Family Medicine

## 2017-09-16 ENCOUNTER — Other Ambulatory Visit: Payer: Self-pay | Admitting: Family Medicine

## 2017-09-16 MED ORDER — TRAMADOL HCL 50 MG PO TABS: ORAL_TABLET | ORAL | 0 refills | Status: AC

## 2017-09-16 NOTE — Telephone Encounter (Signed)
medication has been sent to her pharmavcy

## 2017-09-16 NOTE — Telephone Encounter (Signed)
Patient sent mychart message to dr and I am waiting on dr to respond

## 2017-09-16 NOTE — Telephone Encounter (Signed)
NH#[phone]  Patient left voicemail requesting prescription for back pain. States she has a pinched nerve & spasms

## 2017-09-16 NOTE — Telephone Encounter (Signed)
Dr Moshe Cipro sent med in for patient and responded to her mychart message

## 2017-09-21 ENCOUNTER — Ambulatory Visit (HOSPITAL_COMMUNITY)
Admission: RE | Admit: 2017-09-21 | Discharge: 2017-09-21 | Disposition: A | Discharge status: Home / Self Care | Payer: Medicare HMO | Source: Ambulatory Visit | Attending: Family Medicine | Admitting: Family Medicine

## 2017-09-21 DIAGNOSIS — Z1231 Encounter for screening mammogram for malignant neoplasm of breast: Secondary | ICD-10-CM | POA: Insufficient documentation

## 2017-09-28 ENCOUNTER — Other Ambulatory Visit: Payer: Self-pay | Admitting: Family Medicine

## 2017-10-07 DIAGNOSIS — E039 Hypothyroidism, unspecified: Secondary | ICD-10-CM | POA: Diagnosis not present

## 2017-10-14 DIAGNOSIS — E1122 Type 2 diabetes mellitus with diabetic chronic kidney disease: Secondary | ICD-10-CM | POA: Diagnosis not present

## 2017-10-14 DIAGNOSIS — Z7982 Long term (current) use of aspirin: Secondary | ICD-10-CM | POA: Diagnosis not present

## 2017-10-14 DIAGNOSIS — E039 Hypothyroidism, unspecified: Secondary | ICD-10-CM | POA: Diagnosis not present

## 2017-10-14 DIAGNOSIS — Z794 Long term (current) use of insulin: Secondary | ICD-10-CM | POA: Diagnosis not present

## 2017-10-14 DIAGNOSIS — N183 Chronic kidney disease, stage 3 (moderate): Secondary | ICD-10-CM | POA: Diagnosis not present

## 2017-10-14 DIAGNOSIS — E1140 Type 2 diabetes mellitus with diabetic neuropathy, unspecified: Secondary | ICD-10-CM | POA: Diagnosis not present

## 2017-10-14 DIAGNOSIS — Z79890 Hormone replacement therapy (postmenopausal): Secondary | ICD-10-CM | POA: Diagnosis not present

## 2017-10-14 DIAGNOSIS — I129 Hypertensive chronic kidney disease with stage 1 through stage 4 chronic kidney disease, or unspecified chronic kidney disease: Secondary | ICD-10-CM | POA: Diagnosis not present

## 2017-10-14 DIAGNOSIS — E1165 Type 2 diabetes mellitus with hyperglycemia: Secondary | ICD-10-CM | POA: Diagnosis not present

## 2017-10-19 ENCOUNTER — Telehealth: Payer: Self-pay | Admitting: Family Medicine

## 2017-10-19 NOTE — Telephone Encounter (Signed)
The Pt is calling to talk with the nurse, as her BP medicine, and Potassium was lost in mail transit.  Please call the pt .

## 2017-10-20 NOTE — Telephone Encounter (Signed)
Called to speak to patient regarding her message about her medications lost in transit. She will need to call [facility] and tell them they were lost in transit and she never received them and they will help her from there.

## 2017-10-20 NOTE — Telephone Encounter (Signed)
Spoke with patient and she spoke with insurance company and they are resending her medication

## 2017-10-23 ENCOUNTER — Encounter: Payer: Self-pay | Admitting: Family Medicine

## 2017-10-26 ENCOUNTER — Ambulatory Visit (INDEPENDENT_AMBULATORY_CARE_PROVIDER_SITE_OTHER): Payer: Medicare HMO | Admitting: Family Medicine

## 2017-10-26 ENCOUNTER — Encounter: Payer: Self-pay | Admitting: Family Medicine

## 2017-10-26 ENCOUNTER — Other Ambulatory Visit: Payer: Self-pay

## 2017-10-26 VITALS — BP 142/86 | HR 80 | Resp 12 | Ht 61.5 in | Wt 169.0 lb

## 2017-10-26 DIAGNOSIS — E669 Obesity, unspecified: Secondary | ICD-10-CM | POA: Diagnosis not present

## 2017-10-26 DIAGNOSIS — N183 Chronic kidney disease, stage 3 unspecified: Secondary | ICD-10-CM

## 2017-10-26 DIAGNOSIS — E1122 Type 2 diabetes mellitus with diabetic chronic kidney disease: Secondary | ICD-10-CM

## 2017-10-26 DIAGNOSIS — E782 Mixed hyperlipidemia: Secondary | ICD-10-CM

## 2017-10-26 DIAGNOSIS — K219 Gastro-esophageal reflux disease without esophagitis: Secondary | ICD-10-CM

## 2017-10-26 DIAGNOSIS — E66811 Obesity, class 1: Secondary | ICD-10-CM

## 2017-10-26 DIAGNOSIS — I10 Essential (primary) hypertension: Secondary | ICD-10-CM

## 2017-10-26 MED ORDER — OMEPRAZOLE 40 MG PO CPDR: 40.0000 mg | DELAYED_RELEASE_CAPSULE | Freq: Every day | ORAL | 3 refills | Status: DC

## 2017-10-26 MED ORDER — OMEPRAZOLE 40 MG PO CPDR: 40.0000 mg | DELAYED_RELEASE_CAPSULE | Freq: Every day | ORAL | 3 refills | Status: AC

## 2017-10-26 NOTE — Progress Notes (Signed)
Christine Dominguez     MRN: [id number]      DOB: May 06, 1950   HPI Christine Dominguez is here with 1 week h/o epigastric and RUQ pain starting 1 week ago at it's worst  Rated at a 7  Today not hurting, today with cereal no pain, but tlast night with fish meal had pain up to a 7  And reflux symptoms and growling She denies change in bowel movements, visible blood in the stool or any black stool. She denies any hematemesis, feels as though symptoms are because she has recently discontinued her PPI and she just needs them back. Denies dysphagia ROS Denies recent fever or chills. Denies sinus pressure, nasal congestion, ear pain or sore throat. Denies chest congestion, productive cough or wheezing. Denies chest pains, palpitations and leg swelling .   Denies dysuria, frequency, hesitancy or incontinence. Denies uncontrolled  joint pain, swelling and limitation in mobility. Denies headaches, seizures, numbness, or tingling. Denies depression, anxiety or insomnia. Denies skin break down or rash.   PE  BP (!) 142/86 (BP Location: Left Arm, Patient Position: Sitting, Cuff Size: Normal)   Pulse 80   Ht 5' 1.5" (1.562 m)   Wt 169 lb (76.7 kg)   SpO2 99%   BMI 31.42 kg/m   Patient alert and oriented and in no cardiopulmonary distress.  HEENT: No facial asymmetry, EOMI,   oropharynx pink and moist.  Neck supple no JVD, no mass.  Chest: Clear to auscultation bilaterally.  CVS: S1, S2 no murmurs, no S3.Regular rate.  ABD: Soft mild epigastric tenderness, no guarding or rebound , normal BS, no organomegaly or mass  Ext: No edema  MS: Adequate though reduced  ROM spine, shoulders, hips and knees.  Skin: Intact, no ulcerations or rash noted.  Psych: Good eye contact, normal affect. Memory intact not anxious or depressed appearing.  CNS: CN 2-12 intact, power,  normal throughout.no focal deficits noted.   Assessment & Plan  GERD (gastroesophageal reflux  disease) Uncontrolled and symptomatic, resume PPI, omeprazole prescribed, pt will call if she continues to have abdominal pain and I will refer her to GI at that time  Mixed hyperlipidemia Hyperlipidemia:Low fat diet discussed and encouraged and literature also given. Updated lab needed at/ before next visit.   Diabetes mellitus with stage 3 chronic kidney disease ([place]) Managed by endo, and improved, still uncontrolled  Christine Dominguez is reminded of the importance of commitment to daily physical activity for 30 minutes or more, as able and the need to limit carbohydrate intake to 30 to 60 grams per meal to help with blood sugar control.   The need to take medication as prescribed, test blood sugar as directed, and to call between visits if there is a concern that blood sugar is uncontrolled is also discussed.   Christine Dominguez is reminded of the importance of daily foot exam, annual eye examination, and good blood sugar, blood pressure and cholesterol control.  Diabetic Labs Latest Ref Rng & Units 10/29/2017 07/27/2017 04/20/2017 01/18/2017 10/13/2016  HbA1c <5.7 % of total Hgb - 8.6(H) 9.0(H) 8.7(H) 8.9(H)  Microalbumin Not estab mg/dL - - - - -  Micro/Creat Ratio <30 mcg/mg creat - - - - -  Chol <200 mg/dL - 104 - 109 -  HDL >50 mg/dL - 38(L) - 37(L) -  Calc LDL mg/dL (calc) - 47 - 50 -  Triglycerides <150 mg/dL - 107 - 139 -  Creatinine 0.44 - 1.00 mg/dL 1.29(H) 1.27(H) 1.37(H) 1.49(H) 1.44(H)  BP/Weight 10/29/2017 10/26/2017 07/27/2017 07/06/2017 07/04/2017 3/88/7195 12/10/4716  Systolic BP 550 158 682 574 935 521 747  Diastolic BP 70 86 82 60 81 70 90  Wt. (Lbs) 169 169 169 167.5 - 166 166  BMI 31.42 31.42 30.91 31.14 - 30.86 30.86   Foot/eye exam completion dates Latest Ref Rng & Units 10/26/2017 08/26/2016  Eye Exam No Retinopathy - -  Foot exam Order - - -  Foot Form Completion - Done Done        Malignant hypertension Improved, no change in medication DASH diet and  commitment to daily physical activity for a minimum of 30 minutes discussed and encouraged, as a part of hypertension management. The importance of attaining a healthy weight is also discussed.  BP/Weight 10/29/2017 10/26/2017 07/27/2017 07/06/2017 07/04/2017 1/59/5396 10/04/8977  Systolic BP 150 413 643 837 793 968 864  Diastolic BP 70 86 82 60 81 70 90  Wt. (Lbs) 169 169 169 167.5 - 166 166  BMI 31.42 31.42 30.91 31.14 - 30.86 30.86       Obesity (BMI 30.0-34.9) Unchanged. Patient re-educated about  the importance of commitment to a  minimum of 150 minutes of exercise per week.  The importance of healthy food choices with portion control discussed. Encouraged to start a food diary, count calories and to consider  joining a support group. Sample diet sheets offered. Goals set by the patient for the next several months.   Weight /BMI 10/29/2017 10/26/2017 07/27/2017  WEIGHT 169 lb 169 lb 169 lb  HEIGHT 5' 1.5" 5' 1.5" 5\' 2"   BMI 31.42 kg/m2 31.42 kg/m2 30.91 kg/m2

## 2017-10-26 NOTE — Patient Instructions (Addendum)
Please cancel physical exam in the office patient is going [place] physical exam  Schedule follow up with MD end November  Please give patient requisition for fasting lipid, cmp and EGFR to be drawn 1 week before her next follow up at checkout  ([employer])  All the best!

## 2017-10-29 ENCOUNTER — Emergency Department (HOSPITAL_COMMUNITY): Payer: Medicare HMO

## 2017-10-29 ENCOUNTER — Encounter (HOSPITAL_COMMUNITY): Payer: Self-pay | Admitting: *Deleted

## 2017-10-29 ENCOUNTER — Other Ambulatory Visit: Payer: Self-pay

## 2017-10-29 ENCOUNTER — Emergency Department (HOSPITAL_COMMUNITY)
Admission: EM | Admit: 2017-10-29 | Discharge: 2017-10-29 | Disposition: A | Discharge status: Home / Self Care | Payer: Medicare HMO | Attending: Emergency Medicine | Admitting: Emergency Medicine

## 2017-10-29 DIAGNOSIS — Z7982 Long term (current) use of aspirin: Secondary | ICD-10-CM | POA: Insufficient documentation

## 2017-10-29 DIAGNOSIS — I129 Hypertensive chronic kidney disease with stage 1 through stage 4 chronic kidney disease, or unspecified chronic kidney disease: Secondary | ICD-10-CM | POA: Diagnosis not present

## 2017-10-29 DIAGNOSIS — E039 Hypothyroidism, unspecified: Secondary | ICD-10-CM | POA: Insufficient documentation

## 2017-10-29 DIAGNOSIS — N183 Chronic kidney disease, stage 3 (moderate): Secondary | ICD-10-CM | POA: Diagnosis not present

## 2017-10-29 DIAGNOSIS — E1122 Type 2 diabetes mellitus with diabetic chronic kidney disease: Secondary | ICD-10-CM | POA: Insufficient documentation

## 2017-10-29 DIAGNOSIS — Z79899 Other long term (current) drug therapy: Secondary | ICD-10-CM | POA: Insufficient documentation

## 2017-10-29 DIAGNOSIS — R002 Palpitations: Secondary | ICD-10-CM | POA: Diagnosis not present

## 2017-10-29 DIAGNOSIS — Z87891 Personal history of nicotine dependence: Secondary | ICD-10-CM | POA: Insufficient documentation

## 2017-10-29 DIAGNOSIS — Z794 Long term (current) use of insulin: Secondary | ICD-10-CM | POA: Diagnosis not present

## 2017-10-29 LAB — CBC
HCT: 33.5 % — ABNORMAL LOW (ref 36.0–46.0)
Hemoglobin: 10.8 g/dL — ABNORMAL LOW (ref 12.0–15.0)
MCH: 27.4 pg (ref 26.0–34.0)
MCHC: 32.2 g/dL (ref 30.0–36.0)
MCV: 85 fL (ref 78.0–100.0)
PLATELETS: 239 10*3/uL (ref 150–400)
RBC: 3.94 MIL/uL (ref 3.87–5.11)
RDW: 12.9 % (ref 11.5–15.5)
WBC: 9.9 10*3/uL (ref 4.0–10.5)

## 2017-10-29 LAB — BASIC METABOLIC PANEL
Anion gap: 6 (ref 5–15)
BUN: 20 mg/dL (ref 8–23)
CO2: 25 mmol/L (ref 22–32)
CREATININE: 1.29 mg/dL — AB (ref 0.44–1.00)
Calcium: 9 mg/dL (ref 8.9–10.3)
Chloride: 102 mmol/L (ref 98–111)
GFR calc Af Amer: 49 mL/min — ABNORMAL LOW (ref >60)
GFR calc non Af Amer: 42 mL/min — ABNORMAL LOW (ref >60)
GLUCOSE: 215 mg/dL — AB (ref 70–99)
Potassium: 4 mmol/L (ref 3.5–5.1)
Sodium: 133 mmol/L — ABNORMAL LOW (ref 135–145)

## 2017-10-29 LAB — CBG MONITORING, ED: Glucose-Capillary: 184 mg/dL — ABNORMAL HIGH (ref 70–99)

## 2017-10-29 LAB — MAGNESIUM: Magnesium: 1.7 mg/dL (ref 1.7–2.4)

## 2017-10-29 NOTE — Discharge Instructions (Addendum)
All the results in the ER normal, however 4-hour heart rhythm monitoring did not reveal any abnormalities either.  If you continue to have intermittent episodes of palpitations, then consider asking your primary care doctor to put you on a Holter monitor /or cardiac event monitor for short period of time to see if you are having any concerning rhythms.

## 2017-10-29 NOTE — ED Triage Notes (Signed)
Pt c/o feeling like her heart is having some palpitations; pt denies any pain; pt denies any sob

## 2017-10-29 NOTE — ED Provider Notes (Signed)
[facility] EMERGENCY DEPARTMENT Provider Note   CSN: [id number] Arrival date & time: 10/29/17  0037     History   Chief Complaint Chief Complaint  Patient presents with  . Palpitations    HPI Christine Dominguez is a 67 y.o. female.  HPI 67 year old female comes in a chief complaint of palpitation. Patient has history of hyperlipidemia, GERD, arthritis, diabetes.  She denies any history of CAD or CHF.  Patient reports that she has had some intermittent palpitations over the past several days.  Today at around 11:00 however she noticed that her episodes were lasting about 10 minutes.  Patient had about 2 or 3 episodes prior to ED arrival, described as palpitations with heart rate racing in the 120s.  Patient is unsure if her heart rate was irregular.  Patient denies any associated chest pain, shortness of breath, dizziness, near fainting or fainting.    Patient denies any new medications. Pt has no hx of PE, DVT and denies any exogenous hormone (testosterone / estrogen) use, long distance travels or surgery in the past 6 weeks, active cancer, recent immobilization.   Past Medical History:  Diagnosis Date  . Anemia   . Arthritis    Cervical spine  . Cancer ([place]) 1998, recurred in 2012   Paraganglioma of jugular tympamicum, recurrent in 2012  . Chronic back pain 2007   Disabled   . Essential hypertension, benign 1980  . GERD (gastroesophageal reflux disease) 2000  . Hydrocephalus    Treated with VP shunt and then intracranial tumor resection (?Cholesteatoma); no longer followed actively by neurosurgery or otolaryngology  . Hyperlipidemia 2013  . Hypothyroidism 2013  . Neuropathy   . Obesity   . Type 2 diabetes mellitus ([place]) 1991    Patient Active Problem List   Diagnosis Date Noted  . Hand pain, right 01/15/2017  . Chronic back pain greater than 3 months duration 11/27/2016  . Rectocele 12/31/2013  . Paraganglioma ([place]) 08/01/2013  . Primary hypothyroidism  11/14/2011  . Mixed hyperlipidemia 11/14/2011  . GERD (gastroesophageal reflux disease) 11/03/2011  . Hearing loss in right ear 11/24/2010  . Diabetes mellitus with stage 3 chronic kidney disease ([place]) 06/11/2010  . Back pain 11/18/2009  . CARPAL TUNNEL SYNDROME 10/19/2009  . Malignant hypertension 10/13/2009    Past Surgical History:  Procedure Laterality Date  . CATARACT EXTRACTION W/PHACO Right 07/17/2013   Procedure: CATARACT EXTRACTION PHACO AND INTRAOCULAR LENS PLACEMENT (IOC);  Surgeon: [name], MD;  Location: [facility];  Service: Ophthalmology;  Laterality: Right;  CDE:7.24  . COLONOSCOPY  06/2010  . Hemorrhoidectomy    . Intracranial mass resected  1998   At [facility]; ? location-right middle ear  . Teeth pulled    . TRIGGER FINGER RELEASE     Rght thumb  . TUBAL LIGATION  1980  . TUBAL LIGATION    . VENTRICULOPERITONEAL SHUNT  1998   At [facility]     OB History    Gravida  3   Para  2   Term      Preterm      AB  1   Living  1     SAB      TAB      Ectopic  1   Multiple      Live Births  1            Home Medications    Prior to Admission medications   Medication Sig Start Date End Date Taking?  Authorizing Provider  ACCU-CHEK FASTCLIX LANCETS MISC TEST BLOOD SUGAR FOUR TIMES DAILY 02/22/17   Cassandria Anger, MD  Alcohol Swabs (B-D SINGLE USE SWABS REGULAR) PADS TEST FOUR TIMES A DAY 02/22/17   Cassandria Anger, MD  aspirin EC 81 MG tablet Take 81 mg by mouth at bedtime.    [provider]  B-D ULTRAFINE III SHORT PEN 31G X 8 MM MISC USE  TO INJECT EVERY DAY  WITH  LANTUS 11/18/16   Cassandria Anger, MD  chlorthalidone (HYGROTON) 25 MG tablet Take 1 tablet (25 mg total) by mouth daily. 06/16/17 09/14/17  Satira Sark, MD  cloNIDine (CATAPRES) 0.1 MG tablet One and a half tablets at 6 am, then at 2 pm ,and two tablets at 10 pm 04/13/17   [name], MD  docusate sodium (STOOL SOFTENER) 100 MG capsule Take 100 mg by  mouth as needed for mild constipation.    [provider]  ferrous sulfate 325 (65 FE) MG EC tablet Take 325 mg by mouth daily.    [provider]  gabapentin (NEURONTIN) 100 MG capsule Take 1 capsule (100 mg total) by mouth 2 (two) times daily. 11/17/16   [name], MD  glucose [facility] GLUCOSE) 4 GM chewable tablet Chew by mouth as needed.  07/29/11   [provider]  glucose blood (ACCU-CHEK SMARTVIEW) test strip TEST 4 TIMES DAILY 03/18/17   Cassandria Anger, MD  Insulin Glargine (LANTUS SOLOSTAR) 100 UNIT/ML Solostar Pen INJECT 70 UNITS INTO THE SKIN AT BEDTIME Patient taking differently: Inject 52 Units into the skin daily. INJECT 70 UNITS INTO THE SKIN AT BEDTIME 04/28/17   Cassandria Anger, MD  levothyroxine (SYNTHROID, LEVOTHROID) 75 MCG tablet Take by mouth. 08/05/17   [provider]  nystatin (MYCOSTATIN/NYSTOP) powder APPLY  TOPICALLY  TO  RASH ON LOWER ABDOMEN AT BEDTIME AS NEEDED 09/28/17   [name], MD  olmesartan (BENICAR) 40 MG tablet Take 1 tablet (40 mg total) by mouth daily. 11/24/16   [name], MD  omeprazole (PRILOSEC) 40 MG capsule Take 1 capsule (40 mg total) by mouth daily. 10/26/17   [name], MD  omeprazole (PRILOSEC) 40 MG capsule Take 1 capsule (40 mg total) by mouth daily. 10/26/17   [name], MD  potassium chloride (K-DUR) 10 MEQ tablet Take 1 tablet (10 mEq total) by mouth 2 (two) times daily. 07/11/17   [name], MD  Simethicone (GAS RELIEF DROPS PO) Take by mouth as needed.    [provider]  simvastatin (ZOCOR) 10 MG tablet TAKE 1 TABLET AT BEDTIME 06/29/17   [name], MD  sodium chloride (OCEAN) 0.65 % nasal spray 1 spray as needed.  12/04/13   [provider]  TRADJENTA 5 MG TABS tablet TAKE 1 TABLET EVERY DAY 01/26/17   Cassandria Anger, MD  traMADol Veatrice Bourbon) 50 MG tablet One tablet two times daily , as needed, for severe back pain  09/16/17   [name], MD    Family History Family History  Problem Relation Age of Onset  . Lung cancer Mother   . Hypertension Mother 67  . Hypertension Brother   . Diabetes Brother   . Diabetes Sister   . Kidney failure Brother   . Hypertension Maternal Grandmother   . Heart disease Maternal Grandmother   . Other Son        stillborn  . Other Son  died at birth  . Diabetes Maternal Grandfather   . Other Son        was murdered    Social History Social History   Tobacco Use  . Smoking status: Former Smoker    Packs/day: 0.50    Years: 15.00    Pack years: 7.50    Types: Cigarettes    Last attempt to quit: 07/07/2001    Years since quitting: 16.3  . Smokeless tobacco: Never Used  Substance Use Topics  . Alcohol use: No    Alcohol/week: 0.0 oz  . Drug use: No     Allergies   Amlodipine; Amaryl; Avandia [rosiglitazone maleate]; Glimepiride; Glipizide; Invokana [canagliflozin]; Lyrica [pregabalin]; Novolog [insulin aspart]; Rosiglitazone; Sitagliptin; and Aspirin   Review of Systems Review of Systems  Constitutional: Positive for activity change.  Respiratory: Negative for shortness of breath.   Cardiovascular: Positive for palpitations. Negative for chest pain.  Neurological: Negative for syncope and light-headedness.     Physical Exam Updated Vital Signs BP (!) 187/70   Pulse 80   Temp 98.3 F (36.8 C) (Oral)   Resp 18   Ht 5' 1.5" (1.562 m)   Wt 76.7 kg (169 lb)   SpO2 100%   BMI 31.42 kg/m   Physical Exam  Constitutional: She is oriented to person, place, and time. She appears well-developed.  HENT:  Head: Normocephalic and atraumatic.  Eyes: EOM are normal.  Neck: Normal range of motion. Neck supple.  Cardiovascular: Normal rate and intact distal pulses.  Pulmonary/Chest: Effort normal.  Abdominal: Bowel sounds are normal.  Neurological: She is alert and oriented to person, place, and time.  Skin: Skin is warm and dry.    Nursing note and vitals reviewed.    ED Treatments / Results  Labs (all labs ordered are listed, but only abnormal results are displayed) Labs Reviewed  BASIC METABOLIC PANEL - Abnormal; Notable for the following components:      Result Value   Sodium 133 (*)    Glucose, Bld 215 (*)    Creatinine, Ser 1.29 (*)    GFR calc non Af Amer 42 (*)    GFR calc Af Amer 49 (*)    All other components within normal limits  CBC - Abnormal; Notable for the following components:   Hemoglobin 10.8 (*)    HCT 33.5 (*)    All other components within normal limits  CBG MONITORING, ED - Abnormal; Notable for the following components:   Glucose-Capillary 184 (*)    All other components within normal limits  MAGNESIUM    EKG EKG Interpretation  Date/Time:  Saturday October 29 2017 00:48:43 EDT Ventricular Rate:  96 PR Interval:    QRS Duration: 101 QT Interval:  364 QTC Calculation: 460 R Axis:   18 Text Interpretation:  Sinus rhythm No acute changes No significant change since last tracing Confirmed by Varney Biles ([id number]) on 10/29/2017 1:31:17 AM   Radiology Dg Chest 2 View  Result Date: 10/29/2017 CLINICAL DATA:  Palpitations since Friday. History of diabetes and hypertension. EXAM: CHEST - 2 VIEW COMPARISON:  06/17/2015 FINDINGS: The heart size and mediastinal contours are within normal limits. Both lungs are clear. The visualized skeletal structures are unremarkable. IMPRESSION: No active cardiopulmonary disease. Electronically Signed   By: Lucienne Capers M.D.   On: 10/29/2017 01:23    Procedures Procedures (including critical care time)  Medications Ordered in ED Medications - No data to display   Initial Impression / Assessment and Plan /  ED Course  I have reviewed the triage vital signs and the nursing notes.  Pertinent labs & imaging results that were available during my care of the patient were reviewed by me and considered in my medical decision making (see chart for  details).  Clinical Course as of Oct 30 638  Sat Oct 29, 2017  0439 Patient's lab results and her telemetry monitoring have been unremarkable.  She did not have any alarms that were concerning on cardiac monitor.  Patient advised to follow-up with her PCP if her symptoms continue  to see if she needs Holter monitoring.  She will return to the ER if she starts having chest pain, shortness of breath or near fainting/fainting.   [AN]    Clinical Course User Index [AN] Varney Biles, MD    67 year old female comes in with chief complaint of palpitations.  Patient noted 2 or 3 episodes of palpitations prior to ED arrival, with heart rate in the 120s.  Patient's symptoms lasted longer than they normally do which got her concerned.  Fortunately, patient did not have any associated symptoms such as chest pain, dizziness, shortness of breath or near syncope.  Patient does not have any PE risk factors, and leg exam does not show any signs of DVT.   PSVT, A. fib, sinus tachycardia are all possibilities.  We will monitor patient in the ED over telemetry.  Basic labs have been ordered.  Final Clinical Impressions(s) / ED Diagnoses   Final diagnoses:  Heart palpitations    ED Discharge Orders    None       Varney Biles, MD 10/29/17 [phone]

## 2017-11-06 ENCOUNTER — Encounter: Payer: Self-pay | Admitting: Family Medicine

## 2017-11-06 DIAGNOSIS — E669 Obesity, unspecified: Secondary | ICD-10-CM | POA: Insufficient documentation

## 2017-11-06 NOTE — Assessment & Plan Note (Signed)
Uncontrolled and symptomatic, resume PPI, omeprazole prescribed, pt will call if she continues to have abdominal pain and I will refer her to GI at that time

## 2017-11-06 NOTE — Assessment & Plan Note (Signed)
Hyperlipidemia:Low fat diet discussed and encouraged and literature also given. Updated lab needed at/ before next visit.

## 2017-11-06 NOTE — Assessment & Plan Note (Signed)
Improved, no change in medication DASH diet and commitment to daily physical activity for a minimum of 30 minutes discussed and encouraged, as a part of hypertension management. The importance of attaining a healthy weight is also discussed.  BP/Weight 10/29/2017 10/26/2017 07/27/2017 07/06/2017 07/04/2017 6/80/8811 0/06/1592  Systolic BP 585 929 244 628 638 177 116  Diastolic BP 70 86 82 60 81 70 90  Wt. (Lbs) 169 169 169 167.5 - 166 166  BMI 31.42 31.42 30.91 31.14 - 30.86 30.86

## 2017-11-06 NOTE — Assessment & Plan Note (Signed)
Unchanged. Patient re-educated about  the importance of commitment to a  minimum of 150 minutes of exercise per week.  The importance of healthy food choices with portion control discussed. Encouraged to start a food diary, count calories and to consider  joining a support group. Sample diet sheets offered. Goals set by the patient for the next several months.   Weight /BMI 10/29/2017 10/26/2017 07/27/2017  WEIGHT 169 lb 169 lb 169 lb  HEIGHT 5' 1.5" 5' 1.5" 5\' 2"   BMI 31.42 kg/m2 31.42 kg/m2 30.91 kg/m2

## 2017-11-06 NOTE — Assessment & Plan Note (Addendum)
Managed by endo, and improved, still uncontrolled  Christine Dominguez is reminded of the importance of commitment to daily physical activity for 30 minutes or more, as able and the need to limit carbohydrate intake to 30 to 60 grams per meal to help with blood sugar control.   The need to take medication as prescribed, test blood sugar as directed, and to call between visits if there is a concern that blood sugar is uncontrolled is also discussed.   Christine Dominguez is reminded of the importance of daily foot exam, annual eye examination, and good blood sugar, blood pressure and cholesterol control.  Diabetic Labs Latest Ref Rng & Units 10/29/2017 07/27/2017 04/20/2017 01/18/2017 10/13/2016  HbA1c <5.7 % of total Hgb - 8.6(H) 9.0(H) 8.7(H) 8.9(H)  Microalbumin Not estab mg/dL - - - - -  Micro/Creat Ratio <30 mcg/mg creat - - - - -  Chol <200 mg/dL - 104 - 109 -  HDL >50 mg/dL - 38(L) - 37(L) -  Calc LDL mg/dL (calc) - 47 - 50 -  Triglycerides <150 mg/dL - 107 - 139 -  Creatinine 0.44 - 1.00 mg/dL 1.29(H) 1.27(H) 1.37(H) 1.49(H) 1.44(H)   BP/Weight 10/29/2017 10/26/2017 07/27/2017 07/06/2017 07/04/2017 9/43/2761 07/11/927  Systolic BP 574 734 037 096 438 381 840  Diastolic BP 70 86 82 60 81 70 90  Wt. (Lbs) 169 169 169 167.5 - 166 166  BMI 31.42 31.42 30.91 31.14 - 30.86 30.86   Foot/eye exam completion dates Latest Ref Rng & Units 10/26/2017 08/26/2016  Eye Exam No Retinopathy - -  Foot exam Order - - -  Foot Form Completion - Done Done

## 2017-11-08 ENCOUNTER — Encounter: Payer: Medicare HMO | Admitting: Family Medicine

## 2017-11-12 NOTE — Progress Notes (Signed)
Cardiology Office Note  Date: 11/14/2017   ID: Christine Dominguez, DOB [birth year], MRN [id number]  PCP: [name], MD  Primary Cardiologist: [name], MD   Chief Complaint  Patient presents with  . Hypertension    History of Present Illness: Christine Dominguez is a 67 y.o. female last seen in February.  She is here for a routine follow-up visit.  Reports compliance with her medications, no exertional chest pain or unusual shortness of breath.  She was seen in the ER on July 27 for assessment of palpitations. ECG showed sinus rhythm and telemetry was reportedly unremarkable.  He states that she feels palpitations perhaps once or twice a year, no lightheadedness or syncope.  We did discuss considering an outpatient cardiac monitor if symptoms increase in frequency.  I reviewed her current medications which are outlined below.  She continues to follow with Dr. Moshe Cipro.  Past Medical History:  Diagnosis Date  . Anemia   . Arthritis    Cervical spine  . Cancer ([place]) 1998, recurred in 2012   Paraganglioma of jugular tympamicum, recurrent in 2012  . Chronic back pain 2007   Disabled   . Essential hypertension, benign 1980  . GERD (gastroesophageal reflux disease) 2000  . Hydrocephalus    Treated with VP shunt and then intracranial tumor resection (?Cholesteatoma); no longer followed actively by neurosurgery or otolaryngology  . Hyperlipidemia 2013  . Hypothyroidism 2013  . Neuropathy   . Obesity   . Type 2 diabetes mellitus ([place]) 1991    Past Surgical History:  Procedure Laterality Date  . CATARACT EXTRACTION W/PHACO Right 07/17/2013   Procedure: CATARACT EXTRACTION PHACO AND INTRAOCULAR LENS PLACEMENT (IOC);  Surgeon: [name], MD;  Location: [facility];  Service: Ophthalmology;  Laterality: Right;  CDE:7.24  . COLONOSCOPY  06/2010  . Hemorrhoidectomy    . Intracranial mass resected  1998   At [facility]; ? location-right middle ear  . Teeth pulled     . TRIGGER FINGER RELEASE     Rght thumb  . TUBAL LIGATION  1980  . TUBAL LIGATION    . VENTRICULOPERITONEAL SHUNT  1998   At [facility]    Current Outpatient Medications  Medication Sig Dispense Refill  . ACCU-CHEK FASTCLIX LANCETS MISC TEST BLOOD SUGAR FOUR TIMES DAILY 408 each 1  . Alcohol Swabs (B-D SINGLE USE SWABS REGULAR) PADS TEST FOUR TIMES A DAY 400 each 2  . aspirin EC 81 MG tablet Take 81 mg by mouth at bedtime.    . B-D ULTRAFINE III SHORT PEN 31G X 8 MM MISC USE  TO INJECT EVERY DAY  WITH  LANTUS 90 each 2  . cloNIDine (CATAPRES) 0.1 MG tablet One and a half tablets at 6 am, then at 2 pm ,and two tablets at 10 pm 450 tablet 3  . docusate sodium (STOOL SOFTENER) 100 MG capsule Take 100 mg by mouth as needed for mild constipation.    . ferrous sulfate 325 (65 FE) MG EC tablet Take 325 mg by mouth daily.    Marland Kitchen gabapentin (NEURONTIN) 100 MG capsule Take 1 capsule (100 mg total) by mouth 2 (two) times daily. 180 capsule 3  . glucose (SUNMARK GLUCOSE) 4 GM chewable tablet Chew by mouth as needed.     Marland Kitchen glucose blood (ACCU-CHEK SMARTVIEW) test strip TEST 4 TIMES DAILY 100 each 2  . Insulin Glargine (LANTUS SOLOSTAR) 100 UNIT/ML Solostar Pen INJECT 70 UNITS INTO THE SKIN AT BEDTIME (Patient taking  differently: Inject 52 Units into the skin daily. INJECT 70 UNITS INTO THE SKIN AT BEDTIME) 45 mL 0  . levothyroxine (SYNTHROID, LEVOTHROID) 75 MCG tablet Take by mouth.    . nystatin (MYCOSTATIN/NYSTOP) powder APPLY  TOPICALLY  TO  RASH ON LOWER ABDOMEN AT BEDTIME AS NEEDED 120 g 1  . olmesartan (BENICAR) 40 MG tablet Take 1 tablet (40 mg total) by mouth daily. 90 tablet 3  . omeprazole (PRILOSEC) 40 MG capsule Take 1 capsule (40 mg total) by mouth daily. 90 capsule 3  . potassium chloride (K-DUR) 10 MEQ tablet Take 1 tablet (10 mEq total) by mouth 2 (two) times daily. 180 tablet 3  . Simethicone (GAS RELIEF DROPS PO) Take by mouth as needed.    . simvastatin (ZOCOR) 10 MG tablet TAKE 1 TABLET  AT BEDTIME 90 tablet 1  . sodium chloride (OCEAN) 0.65 % nasal spray 1 spray as needed.     . TRADJENTA 5 MG TABS tablet TAKE 1 TABLET EVERY DAY 90 tablet 0  . traMADol (ULTRAM) 50 MG tablet One tablet two times daily , as needed, for severe back pain 20 tablet 0  . chlorthalidone (HYGROTON) 25 MG tablet Take 1 tablet (25 mg total) by mouth daily. 90 tablet 3   No current facility-administered medications for this visit.    Allergies:  Amlodipine; Amaryl; Avandia [rosiglitazone maleate]; Glimepiride; Glipizide; Invokana [canagliflozin]; Lyrica [pregabalin]; Novolog [insulin aspart]; Rosiglitazone; Sitagliptin; and Aspirin   Social History: The patient  reports that she quit smoking about 16 years ago. Her smoking use included cigarettes. She has a 7.50 pack-year smoking history. She has never used smokeless tobacco. She reports that she does not drink alcohol or use drugs.   ROS:  Please see the history of present illness. Otherwise, complete review of systems is positive for intermittent ankle swelling.  All other systems are reviewed and negative.   Physical Exam: VS:  BP (!) 152/72 (BP Location: Left Arm, Patient Position: Sitting)   Pulse 88   Ht 5\' 1"  (1.549 m)   Wt 171 lb 12.8 oz (77.9 kg)   SpO2 99%   BMI 32.46 kg/m , BMI Body mass index is 32.46 kg/m.  Wt Readings from Last 3 Encounters:  11/14/17 171 lb 12.8 oz (77.9 kg)  10/29/17 169 lb (76.7 kg)  10/26/17 169 lb (76.7 kg)    General: Patient appears comfortable at rest. HEENT: Conjunctiva and lids normal, oropharynx clear. Neck: Supple, no elevated JVP or carotid bruits, no thyromegaly. Lungs: Clear to auscultation, nonlabored breathing at rest. Cardiac: Regular rate and rhythm, no S3, soft systolic murmur. Abdomen: Soft, nontender, bowel sounds present. Extremities: Trace ankle edema, distal pulses 2+.  ECG: I personally reviewed the tracing from 10/29/2017 which showed sinus rhythm.  Recent Labwork: 07/27/2017: ALT  15; AST 12; TSH 0.29 10/29/2017: BUN 20; Creatinine, Ser 1.29; Hemoglobin 10.8; Magnesium 1.7; Platelets 239; Potassium 4.0; Sodium 133     Component Value Date/Time   CHOL 104 07/27/2017 1242   TRIG 107 07/27/2017 1242   HDL 38 (L) 07/27/2017 1242   CHOLHDL 2.7 07/27/2017 1242   VLDL 21 08/19/2016 0708   LDLCALC 47 07/27/2017 1242    Other Studies Reviewed Today:  Chest x-ray 10/29/2017: FINDINGS: The heart size and mediastinal contours are within normal limits. Both lungs are clear. The visualized skeletal structures are unremarkable.  IMPRESSION: No active cardiopulmonary disease.  Assessment and Plan:  1.  Essential hypertension.  Plan to continue present regimen which she  seems to be tolerating well.  We discussed sodium restriction in the diet.  Keep follow-up with Dr. Moshe Cipro.  2.  Interval palpitations, no documented arrhythmia.  She reports infrequent symptoms and we will continue with observation for now.  Outpatient cardiac monitor could always be considered if symptoms increase in frequency.  3.  Mixed hyperlipidemia, on Zocor.  LDL 47.  Current medicines were reviewed with the patient today.  Disposition: Follow-up in 6 months.  Signed, Satira Sark, MD, [facility] 11/14/2017 11:43 AM    [facility] [place]. [address] [place], [place] [address] Phone: [phone]; Fax: [phone]

## 2017-11-14 ENCOUNTER — Ambulatory Visit (INDEPENDENT_AMBULATORY_CARE_PROVIDER_SITE_OTHER): Payer: Medicare HMO | Admitting: Cardiology

## 2017-11-14 ENCOUNTER — Other Ambulatory Visit: Payer: Self-pay | Admitting: Family Medicine

## 2017-11-14 ENCOUNTER — Encounter: Payer: Self-pay | Admitting: Cardiology

## 2017-11-14 VITALS — BP 152/72 | HR 88 | Ht 61.0 in | Wt 171.8 lb

## 2017-11-14 DIAGNOSIS — E782 Mixed hyperlipidemia: Secondary | ICD-10-CM

## 2017-11-14 DIAGNOSIS — I10 Essential (primary) hypertension: Secondary | ICD-10-CM | POA: Diagnosis not present

## 2017-11-14 DIAGNOSIS — R002 Palpitations: Secondary | ICD-10-CM | POA: Diagnosis not present

## 2017-11-14 NOTE — Patient Instructions (Addendum)
Your physician wants you to follow-up in:6 months  With Dr.McDowell You will receive a reminder letter in the mail two months in advance. If you don't receive a letter, please call our office to schedule the follow-up appointment.    Your physician recommends that you continue on your current medications as directed. Please refer to the Current Medication list given to you today.    If you need a refill on your cardiac medications before your next appointment, please call your pharmacy.     No labs or tests today.      Thank you for choosing [facility] !

## 2017-11-17 ENCOUNTER — Encounter (HOSPITAL_COMMUNITY): Payer: Self-pay | Admitting: Emergency Medicine

## 2017-11-17 ENCOUNTER — Emergency Department (HOSPITAL_COMMUNITY)
Admission: EM | Admit: 2017-11-17 | Discharge: 2017-11-17 | Disposition: A | Discharge status: Home / Self Care | Payer: Medicare HMO | Attending: Emergency Medicine | Admitting: Emergency Medicine

## 2017-11-17 ENCOUNTER — Emergency Department (HOSPITAL_COMMUNITY): Payer: Medicare HMO

## 2017-11-17 ENCOUNTER — Other Ambulatory Visit: Payer: Self-pay

## 2017-11-17 DIAGNOSIS — Z87891 Personal history of nicotine dependence: Secondary | ICD-10-CM | POA: Insufficient documentation

## 2017-11-17 DIAGNOSIS — E1122 Type 2 diabetes mellitus with diabetic chronic kidney disease: Secondary | ICD-10-CM | POA: Insufficient documentation

## 2017-11-17 DIAGNOSIS — I129 Hypertensive chronic kidney disease with stage 1 through stage 4 chronic kidney disease, or unspecified chronic kidney disease: Secondary | ICD-10-CM | POA: Diagnosis not present

## 2017-11-17 DIAGNOSIS — N183 Chronic kidney disease, stage 3 (moderate): Secondary | ICD-10-CM | POA: Diagnosis not present

## 2017-11-17 DIAGNOSIS — R002 Palpitations: Secondary | ICD-10-CM | POA: Diagnosis not present

## 2017-11-17 DIAGNOSIS — R946 Abnormal results of thyroid function studies: Secondary | ICD-10-CM | POA: Insufficient documentation

## 2017-11-17 DIAGNOSIS — E039 Hypothyroidism, unspecified: Secondary | ICD-10-CM | POA: Diagnosis not present

## 2017-11-17 DIAGNOSIS — R0602 Shortness of breath: Secondary | ICD-10-CM | POA: Diagnosis not present

## 2017-11-17 DIAGNOSIS — R7989 Other specified abnormal findings of blood chemistry: Secondary | ICD-10-CM

## 2017-11-17 LAB — CBC WITH DIFFERENTIAL/PLATELET
Basophils Absolute: 0 10*3/uL (ref 0.0–0.1)
Basophils Relative: 0 %
Eosinophils Absolute: 0.2 10*3/uL (ref 0.0–0.7)
Eosinophils Relative: 2 %
HCT: 33.8 % — ABNORMAL LOW (ref 36.0–46.0)
Hemoglobin: 10.6 g/dL — ABNORMAL LOW (ref 12.0–15.0)
Lymphocytes Relative: 22 %
Lymphs Abs: 1.9 10*3/uL (ref 0.7–4.0)
MCH: 26.9 pg (ref 26.0–34.0)
MCHC: 31.4 g/dL (ref 30.0–36.0)
MCV: 85.8 fL (ref 78.0–100.0)
Monocytes Absolute: 0.5 10*3/uL (ref 0.1–1.0)
Monocytes Relative: 6 %
Neutro Abs: 6.3 10*3/uL (ref 1.7–7.7)
Neutrophils Relative %: 70 %
Platelets: 210 10*3/uL (ref 150–400)
RBC: 3.94 MIL/uL (ref 3.87–5.11)
RDW: 13.4 % (ref 11.5–15.5)
WBC: 8.9 10*3/uL (ref 4.0–10.5)

## 2017-11-17 LAB — I-STAT TROPONIN, ED: TROPONIN I, POC: 0 ng/mL (ref 0.00–0.08)

## 2017-11-17 LAB — COMPREHENSIVE METABOLIC PANEL
ALT: 14 U/L (ref 0–44)
AST: 14 U/L — ABNORMAL LOW (ref 15–41)
Albumin: 3.8 g/dL (ref 3.5–5.0)
Alkaline Phosphatase: 57 U/L (ref 38–126)
Anion gap: 7 (ref 5–15)
BUN: 17 mg/dL (ref 8–23)
CO2: 24 mmol/L (ref 22–32)
Calcium: 9.2 mg/dL (ref 8.9–10.3)
Chloride: 106 mmol/L (ref 98–111)
Creatinine, Ser: 1.23 mg/dL — ABNORMAL HIGH (ref 0.44–1.00)
GFR calc Af Amer: 52 mL/min — ABNORMAL LOW (ref >60)
GFR calc non Af Amer: 45 mL/min — ABNORMAL LOW (ref >60)
Glucose, Bld: 192 mg/dL — ABNORMAL HIGH (ref 70–99)
Potassium: 4 mmol/L (ref 3.5–5.1)
Sodium: 137 mmol/L (ref 135–145)
Total Bilirubin: 0.4 mg/dL (ref 0.3–1.2)
Total Protein: 7.8 g/dL (ref 6.5–8.1)

## 2017-11-17 LAB — TSH: TSH: 0.332 u[IU]/mL — ABNORMAL LOW (ref 0.350–4.500)

## 2017-11-17 MED ORDER — SODIUM CHLORIDE 0.9 % IV BOLUS
500.0000 mL | Freq: Once | INTRAVENOUS | Status: AC
Start: 2017-11-17 — End: 2017-11-17
  Administered 2017-11-17: 500 mL via INTRAVENOUS

## 2017-11-17 NOTE — ED Triage Notes (Signed)
Pt states it feels like her heart is racing, she states it started x 10 minutes ago.

## 2017-11-17 NOTE — Discharge Instructions (Signed)
You were seen in the ED today with heart palpitations. Your labs were normal except for your TSH. This is a thyroid lab and your PCP will need to do some more testing in the office. Call tomorrow to schedule an appointment. You should also call your Cardiologist tomorrow to schedule a follow up appointment.

## 2017-11-17 NOTE — ED Provider Notes (Signed)
Emergency Department Provider Note   I have reviewed the triage vital signs and the nursing notes.   HISTORY  Chief Complaint Palpitations   HPI Christine Dominguez is a 67 y.o. female with PMH of HTN, GERD, HLD, and DM presents to the emergency department for evaluation of heart palpitations.  She reports approximately 10 minutes prior to arrival she experienced a palpitation like sensation in her left chest.  States it lasted for several minutes and was recorded on her Fitbit.  He said the maximum heart rate reached into the 130s.  She denies any chest pain or dyspnea.  No lightheadedness.  She had an episode similar to this in late July and came to the emergency department with no findings at that time.  She is followed up with her cardiologist, Dr. Domenic Polite, who evaluated the patient and advised outpatient monitoring if symptoms became more frequent. Denies any new medications.  Past Medical History:  Diagnosis Date  . Anemia   . Arthritis    Cervical spine  . Cancer ([place]) 1998, recurred in 2012   Paraganglioma of jugular tympamicum, recurrent in 2012  . Chronic back pain 2007   Disabled   . Essential hypertension, benign 1980  . GERD (gastroesophageal reflux disease) 2000  . Hydrocephalus    Treated with VP shunt and then intracranial tumor resection (?Cholesteatoma); no longer followed actively by neurosurgery or otolaryngology  . Hyperlipidemia 2013  . Hypothyroidism 2013  . Neuropathy   . Obesity   . Type 2 diabetes mellitus ([place]) 1991    Patient Active Problem List   Diagnosis Date Noted  . Obesity (BMI 30.0-34.9) 11/06/2017  . Chronic back pain greater than 3 months duration 11/27/2016  . Rectocele 12/31/2013  . Paraganglioma ([place]) 08/01/2013  . Primary hypothyroidism 11/14/2011  . Mixed hyperlipidemia 11/14/2011  . GERD (gastroesophageal reflux disease) 11/03/2011  . Hearing loss in right ear 11/24/2010  . Diabetes mellitus with stage 3 chronic kidney  disease ([place]) 06/11/2010  . Back pain 11/18/2009  . CARPAL TUNNEL SYNDROME 10/19/2009  . Malignant hypertension 10/13/2009    Past Surgical History:  Procedure Laterality Date  . CATARACT EXTRACTION W/PHACO Right 07/17/2013   Procedure: CATARACT EXTRACTION PHACO AND INTRAOCULAR LENS PLACEMENT (IOC);  Surgeon: [name], MD;  Location: [facility];  Service: Ophthalmology;  Laterality: Right;  CDE:7.24  . COLONOSCOPY  06/2010  . Hemorrhoidectomy    . Intracranial mass resected  1998   At [facility]; ? location-right middle ear  . Teeth pulled    . TRIGGER FINGER RELEASE     Rght thumb  . TUBAL LIGATION  1980  . TUBAL LIGATION    . VENTRICULOPERITONEAL SHUNT  1998   At [facility]    Allergies Amlodipine; Avandia [rosiglitazone maleate]; Glimepiride; Glipizide; Invokana [canagliflozin]; Lyrica [pregabalin]; Novolog [insulin aspart]; Rosiglitazone; Sitagliptin; and Aspirin  Family History  Problem Relation Age of Onset  . Lung cancer Mother   . Hypertension Mother 67  . Hypertension Brother   . Diabetes Brother   . Diabetes Sister   . Kidney failure Brother   . Hypertension Maternal Grandmother   . Heart disease Maternal Grandmother   . Other Son        stillborn  . Other Son        died at birth  . Diabetes Maternal Grandfather   . Other Son        was murdered    Social History Social History   Tobacco Use  .  Smoking status: Former Smoker    Packs/day: 0.50    Years: 15.00    Pack years: 7.50    Types: Cigarettes    Last attempt to quit: 07/07/2001    Years since quitting: 16.3  . Smokeless tobacco: Never Used  Substance Use Topics  . Alcohol use: No    Alcohol/week: 0.0 standard drinks  . Drug use: No    Review of Systems  Constitutional: No fever/chills Eyes: No visual changes. ENT: No sore throat. Cardiovascular: Denies chest pain. Positive heart palpitations.  Respiratory: Denies shortness of breath. Gastrointestinal: No abdominal pain.  No nausea, no  vomiting.  No diarrhea.  No constipation. Genitourinary: Negative for dysuria. Musculoskeletal: Negative for back pain. Skin: Negative for rash. Neurological: Negative for headaches, focal weakness or numbness.  10-point ROS otherwise negative.  ____________________________________________   PHYSICAL EXAM:  VITAL SIGNS: ED Triage Vitals  Enc Vitals Group     BP 11/17/17 2022 (!) 184/92     Pulse Rate 11/17/17 2022 98     Resp 11/17/17 2022 18     Temp 11/17/17 2022 98.1 F (36.7 C)     Temp src --      SpO2 11/17/17 2022 99 %     Weight 11/17/17 2019 171 lb (77.6 kg)     Height 11/17/17 2019 5\' 1"  (1.549 m)     Pain Score 11/17/17 2019 1   Constitutional: Alert and oriented. Well appearing and in no acute distress. Eyes: Conjunctivae are normal.  Head: Atraumatic. Nose: No congestion/rhinnorhea. Mouth/Throat: Mucous membranes are moist. Neck: No stridor.  Cardiovascular: Normal rate, regular rhythm. Good peripheral circulation. Grossly normal heart sounds.   Respiratory: Normal respiratory effort.  No retractions. Lungs CTAB. Gastrointestinal: Soft and nontender. No distention.  Musculoskeletal: No lower extremity tenderness nor edema. No gross deformities of extremities. Neurologic:  Normal speech and language. No gross focal neurologic deficits are appreciated.  Skin:  Skin is warm, dry and intact. No rash noted.  ____________________________________________   LABS (all labs ordered are listed, but only abnormal results are displayed)  Labs Reviewed  COMPREHENSIVE METABOLIC PANEL - Abnormal; Notable for the following components:      Result Value   Glucose, Bld 192 (*)    Creatinine, Ser 1.23 (*)    AST 14 (*)    GFR calc non Af Amer 45 (*)    GFR calc Af Amer 52 (*)    All other components within normal limits  CBC WITH DIFFERENTIAL/PLATELET - Abnormal; Notable for the following components:   Hemoglobin 10.6 (*)    HCT 33.8 (*)    All other components  within normal limits  TSH - Abnormal; Notable for the following components:   TSH 0.332 (*)    All other components within normal limits  I-STAT TROPONIN, ED   ____________________________________________  EKG   EKG Interpretation  Date/Time:  Thursday November 17 2017 20:18:45 EDT Ventricular Rate:  98 PR Interval:  174 QRS Duration: 90 QT Interval:  334 QTC Calculation: 426 R Axis:   60 Text Interpretation:  Normal sinus rhythm Nonspecific T wave abnormality Abnormal ECG No STEMI  Confirmed by Nanda Quinton [phone]) on 11/17/2017 8:33:58 PM       ____________________________________________  RADIOLOGY  Dg Chest 2 View  Result Date: 11/17/2017 CLINICAL DATA:  Palpitations and shortness of breath EXAM: CHEST - 2 VIEW COMPARISON:  10/30/2017 FINDINGS: Cardiac shadow is prominent but stable. Ventricular peritoneal shunt catheter is noted on the left stable from  the prior exam. The lungs are clear bilaterally. No focal infiltrate or sizable effusion is seen. No bony abnormality is noted. IMPRESSION: No acute abnormality noted. Electronically Signed   By: Inez Catalina M.D.   On: 11/17/2017 21:44    ____________________________________________   PROCEDURES  Procedure(s) performed:   Procedures  None ____________________________________________   INITIAL IMPRESSION / ASSESSMENT AND PLAN / ED COURSE  Pertinent labs & imaging results that were available during my care of the patient were reviewed by me and considered in my medical decision making (see chart for details).  Patient presents to the emergency department for evaluation of heart palpitations.  This is recorded by her Fitbit as a rate 130s but obviously no tracing is available.  Patient is asymptomatic on arrival to the emergency department.  She is in sinus rhythm with normal intervals and no evidence of ischemia.  For screening labs and chest x-ray.  We will keep on telemetry here to monitor for arrhythmias.  Labs  unremarkable. Patient to f/u with PCP regarding TSH. No arrhythmia in the ED.   At this time, I do not feel there is any life-threatening condition present. I have reviewed and discussed all results (EKG, imaging, lab, urine as appropriate), exam findings with patient. I have reviewed nursing notes and appropriate previous records.  I feel the patient is safe to be discharged home without further emergent workup. Discussed usual and customary return precautions. Patient and family (if present) verbalize understanding and are comfortable with this plan.  Patient will follow-up with their primary care provider. If they do not have a primary care provider, information for follow-up has been provided to them. All questions have been answered.  ____________________________________________  FINAL CLINICAL IMPRESSION(S) / ED DIAGNOSES  Final diagnoses:  Heart palpitations  Abnormal TSH     MEDICATIONS GIVEN DURING THIS VISIT:  Medications  sodium chloride 0.9 % bolus 500 mL (0 mLs Intravenous Stopped 11/17/17 2204)    Note:  This document was prepared using Dragon voice recognition software and may include unintentional dictation errors.  Nanda Quinton, MD Emergency Medicine    Elina Streng, [facility], MD 11/17/17 [phone]

## 2017-11-18 ENCOUNTER — Telehealth: Payer: Self-pay | Admitting: Cardiology

## 2017-11-18 ENCOUNTER — Encounter: Payer: Self-pay | Admitting: Family Medicine

## 2017-11-18 NOTE — Telephone Encounter (Signed)
Returned pt call. She stated she was seen in ER for palpitations. They told her her that her  thyroid medication needed to be adjusted. I advised her to contact her pcp to discuss that. Pt voiced understanding.

## 2017-11-18 NOTE — Telephone Encounter (Signed)
Pt was seen in the ER last night and would like someone to please give her a call

## 2017-12-07 ENCOUNTER — Other Ambulatory Visit: Payer: Self-pay | Admitting: Family Medicine

## 2018-01-05 ENCOUNTER — Encounter: Payer: Self-pay | Admitting: Adult Health

## 2018-01-05 ENCOUNTER — Ambulatory Visit (INDEPENDENT_AMBULATORY_CARE_PROVIDER_SITE_OTHER): Payer: Medicare HMO | Admitting: Adult Health

## 2018-01-05 ENCOUNTER — Other Ambulatory Visit (HOSPITAL_COMMUNITY)
Admission: RE | Admit: 2018-01-05 | Discharge: 2018-01-05 | Disposition: A | Discharge status: Home / Self Care | Payer: Medicare HMO | Source: Ambulatory Visit | Attending: Adult Health | Admitting: Adult Health

## 2018-01-05 VITALS — BP 179/80 | HR 80 | Ht 61.0 in | Wt 171.0 lb

## 2018-01-05 DIAGNOSIS — Z1212 Encounter for screening for malignant neoplasm of rectum: Secondary | ICD-10-CM | POA: Diagnosis not present

## 2018-01-05 DIAGNOSIS — Z1211 Encounter for screening for malignant neoplasm of colon: Secondary | ICD-10-CM | POA: Diagnosis not present

## 2018-01-05 DIAGNOSIS — Z01419 Encounter for gynecological examination (general) (routine) without abnormal findings: Secondary | ICD-10-CM | POA: Insufficient documentation

## 2018-01-05 LAB — HEMOCCULT GUIAC POC 1CARD (OFFICE): FECAL OCCULT BLD: NEGATIVE

## 2018-01-05 NOTE — Progress Notes (Signed)
  Subjective:     Patient ID: Christine Dominguez, female   DOB: 06-Sep-1950, 67 y.o.   MRN: [id number]  HPI Baelynn is a 67 year old black female, widowed in for a pap and pelvic exam, Dr Moshe Cipro her physical and labs. PCP is Dr Moshe Cipro.   Review of Systems Patient denies any headaches, hearing loss, fatigue, blurred vision, shortness of breath, chest pain, abdominal pain, problems with bowel movements, urination, or intercourse(not active). No joint pain or mood swings.Has sciatic back pain. But she is active and uses a cane.She goes to senior center often to eat and play games.   Reviewed past medical,surgical, social and family history. Reviewed medications and allergies.     Objective:   Physical Exam BP (!) 179/80 (BP Location: Left Arm, Patient Position: Sitting, Cuff Size: Normal)   Pulse 80   Ht 5\' 1"  (1.549 m)   Wt 171 lb (77.6 kg)   BMI 32.31 kg/m    Skin warm and dry.Pelvic: external genitalia is normal in appearance no lesions, vagina: pale with loss of moisture and rugae,urethra has no lesions or masses noted, cervix:smooth, pap with HPV performed, uterus: normal size, shape and contour, non tender, no masses felt, adnexa: no masses or tenderness noted. Bladder is non tender and no masses felt.On rectal exam has good tone, no masses felt and hemoccult was negative. PHQ 2 score 0. Examination chaperoned by Estill Bamberg Rash LPN.  Assessment:     1. Encounter for gynecological examination with Papanicolaou smear of cervix   2. Encounter for colorectal cancer screening       Plan:     Pap in 2-3 years Labs with PCP Mammogram  Every year Colonoscopy per GI

## 2018-01-06 LAB — CYTOLOGY - PAP
Diagnosis: NEGATIVE
HPV: NOT DETECTED

## 2018-01-13 DIAGNOSIS — E1165 Type 2 diabetes mellitus with hyperglycemia: Secondary | ICD-10-CM | POA: Diagnosis not present

## 2018-01-13 DIAGNOSIS — Z794 Long term (current) use of insulin: Secondary | ICD-10-CM | POA: Diagnosis not present

## 2018-01-13 DIAGNOSIS — E039 Hypothyroidism, unspecified: Secondary | ICD-10-CM | POA: Diagnosis not present

## 2018-01-13 LAB — BASIC METABOLIC PANEL WITH GFR: Glucose: 131

## 2018-01-13 LAB — TSH: TSH: 1.72 (ref <=5.90)

## 2018-01-13 LAB — HEMOGLOBIN A1C: Hgb A1c MFr Bld: 8.3 — AB (ref 4.0–6.0)

## 2018-01-14 ENCOUNTER — Emergency Department (HOSPITAL_COMMUNITY)
Admission: EM | Admit: 2018-01-14 | Discharge: 2018-01-14 | Disposition: A | Discharge status: Home / Self Care | Payer: Medicare HMO | Attending: Emergency Medicine | Admitting: Emergency Medicine

## 2018-01-14 ENCOUNTER — Encounter (HOSPITAL_COMMUNITY): Payer: Self-pay | Admitting: Emergency Medicine

## 2018-01-14 ENCOUNTER — Other Ambulatory Visit: Payer: Self-pay

## 2018-01-14 DIAGNOSIS — Z794 Long term (current) use of insulin: Secondary | ICD-10-CM | POA: Diagnosis not present

## 2018-01-14 DIAGNOSIS — Z79899 Other long term (current) drug therapy: Secondary | ICD-10-CM | POA: Insufficient documentation

## 2018-01-14 DIAGNOSIS — E1165 Type 2 diabetes mellitus with hyperglycemia: Secondary | ICD-10-CM | POA: Diagnosis not present

## 2018-01-14 DIAGNOSIS — E039 Hypothyroidism, unspecified: Secondary | ICD-10-CM | POA: Diagnosis not present

## 2018-01-14 DIAGNOSIS — Z86018 Personal history of other benign neoplasm: Secondary | ICD-10-CM | POA: Insufficient documentation

## 2018-01-14 DIAGNOSIS — E1122 Type 2 diabetes mellitus with diabetic chronic kidney disease: Secondary | ICD-10-CM | POA: Insufficient documentation

## 2018-01-14 DIAGNOSIS — R739 Hyperglycemia, unspecified: Secondary | ICD-10-CM | POA: Diagnosis not present

## 2018-01-14 DIAGNOSIS — Z87891 Personal history of nicotine dependence: Secondary | ICD-10-CM | POA: Insufficient documentation

## 2018-01-14 DIAGNOSIS — N183 Chronic kidney disease, stage 3 (moderate): Secondary | ICD-10-CM | POA: Insufficient documentation

## 2018-01-14 DIAGNOSIS — E1140 Type 2 diabetes mellitus with diabetic neuropathy, unspecified: Secondary | ICD-10-CM | POA: Insufficient documentation

## 2018-01-14 LAB — COMPREHENSIVE METABOLIC PANEL
ALBUMIN: 3.8 g/dL (ref 3.5–5.0)
ALT: 15 U/L (ref 0–44)
AST: 16 U/L (ref 15–41)
Alkaline Phosphatase: 57 U/L (ref 38–126)
Anion gap: 4 — ABNORMAL LOW (ref 5–15)
BILIRUBIN TOTAL: 0.3 mg/dL (ref 0.3–1.2)
BUN: 20 mg/dL (ref 8–23)
CO2: 26 mmol/L (ref 22–32)
Calcium: 8.9 mg/dL (ref 8.9–10.3)
Chloride: 107 mmol/L (ref 98–111)
Creatinine, Ser: 1.35 mg/dL — ABNORMAL HIGH (ref 0.44–1.00)
GFR calc Af Amer: 46 mL/min — ABNORMAL LOW (ref >60)
GFR calc non Af Amer: 40 mL/min — ABNORMAL LOW (ref >60)
GLUCOSE: 163 mg/dL — AB (ref 70–99)
POTASSIUM: 4 mmol/L (ref 3.5–5.1)
Sodium: 137 mmol/L (ref 135–145)
TOTAL PROTEIN: 7.7 g/dL (ref 6.5–8.1)

## 2018-01-14 LAB — CBG MONITORING, ED: Glucose-Capillary: 140 mg/dL — ABNORMAL HIGH (ref 70–99)

## 2018-01-14 LAB — CBC WITH DIFFERENTIAL/PLATELET
ABS IMMATURE GRANULOCYTES: 0.05 10*3/uL (ref 0.00–0.07)
BASOS ABS: 0 10*3/uL (ref 0.0–0.1)
Basophils Relative: 1 %
Eosinophils Absolute: 0.2 10*3/uL (ref 0.0–0.5)
Eosinophils Relative: 3 %
HEMATOCRIT: 35 % — AB (ref 36.0–46.0)
HEMOGLOBIN: 10.5 g/dL — AB (ref 12.0–15.0)
IMMATURE GRANULOCYTES: 1 %
LYMPHS ABS: 1.7 10*3/uL (ref 0.7–4.0)
LYMPHS PCT: 22 %
MCH: 26.4 pg (ref 26.0–34.0)
MCHC: 30 g/dL (ref 30.0–36.0)
MCV: 87.9 fL (ref 80.0–100.0)
Monocytes Absolute: 0.5 10*3/uL (ref 0.1–1.0)
Monocytes Relative: 7 %
NEUTROS PCT: 66 %
Neutro Abs: 5.2 10*3/uL (ref 1.7–7.7)
Platelets: 221 10*3/uL (ref 150–400)
RBC: 3.98 MIL/uL (ref 3.87–5.11)
RDW: 13.8 % (ref 11.5–15.5)
WBC: 7.7 10*3/uL (ref 4.0–10.5)
nRBC: 0 % (ref 0.0–0.2)

## 2018-01-14 NOTE — Discharge Instructions (Addendum)
Continue using the insulin as prescribed by your doctor

## 2018-01-14 NOTE — ED Triage Notes (Signed)
Pt reports that she took 34 units of Lantus this morning but has been noticing her blood sugar is steadily dropping. States last checked 10 minutes ago and was 117, took glucose tablets, drank juice, and coke. Educated pt on normal CBG readings.

## 2018-01-14 NOTE — ED Provider Notes (Signed)
[facility] EMERGENCY DEPARTMENT Provider Note   CSN: [id number] Arrival date & time: 01/14/18  1630     History   Chief Complaint Chief Complaint  Patient presents with  . Hypoglycemia    HPI Christine Dominguez is a 67 y.o. female.  Patient states they increased her insulin yesterday and she felt like her sugar was low.  Patient checked her sugar and it was still over 100.  The history is provided by the patient. No language interpreter was used.  Illness  This is a recurrent problem. The current episode started 3 to 5 hours ago. The problem occurs constantly. The problem has not changed since onset.Pertinent negatives include no chest pain, no abdominal pain and no headaches. Nothing aggravates the symptoms. Nothing relieves the symptoms. She has tried nothing for the symptoms. The treatment provided no relief.    Past Medical History:  Diagnosis Date  . Anemia   . Arthritis    Cervical spine  . Cancer ([place]) 1998, recurred in 2012   Paraganglioma of jugular tympamicum, recurrent in 2012  . Chronic back pain 2007   Disabled   . Essential hypertension, benign 1980  . GERD (gastroesophageal reflux disease) 2000  . Hydrocephalus ([facility])    Treated with VP shunt and then intracranial tumor resection (?Cholesteatoma); no longer followed actively by neurosurgery or otolaryngology  . Hyperlipidemia 2013  . Hypothyroidism 2013  . Neuropathy   . Obesity   . Type 2 diabetes mellitus ([place]) 1991    Patient Active Problem List   Diagnosis Date Noted  . Encounter for colorectal cancer screening 01/05/2018  . Encounter for gynecological examination with Papanicolaou smear of cervix 01/05/2018  . Obesity (BMI 30.0-34.9) 11/06/2017  . Chronic back pain greater than 3 months duration 11/27/2016  . Rectocele 12/31/2013  . Paraganglioma ([place]) 08/01/2013  . Primary hypothyroidism 11/14/2011  . Mixed hyperlipidemia 11/14/2011  . GERD (gastroesophageal reflux disease)  11/03/2011  . Hearing loss in right ear 11/24/2010  . Diabetes mellitus with stage 3 chronic kidney disease ([place]) 06/11/2010  . Back pain 11/18/2009  . CARPAL TUNNEL SYNDROME 10/19/2009  . Malignant hypertension 10/13/2009    Past Surgical History:  Procedure Laterality Date  . CATARACT EXTRACTION W/PHACO Right 07/17/2013   Procedure: CATARACT EXTRACTION PHACO AND INTRAOCULAR LENS PLACEMENT (IOC);  Surgeon: [name], MD;  Location: [facility];  Service: Ophthalmology;  Laterality: Right;  CDE:7.24  . COLONOSCOPY  06/2010  . Hemorrhoidectomy    . Intracranial mass resected  1998   At [facility]; ? location-right middle ear  . Teeth pulled    . TRIGGER FINGER RELEASE     Rght thumb  . TUBAL LIGATION  1980  . TUBAL LIGATION    . VENTRICULOPERITONEAL SHUNT  1998   At [facility]     OB History    Gravida  3   Para  2   Term      Preterm      AB  1   Living  1     SAB      TAB      Ectopic  1   Multiple      Live Births  1            Home Medications    Prior to Admission medications   Medication Sig Start Date End Date Taking? Authorizing Provider  chlorthalidone (HYGROTON) 25 MG tablet Take 1 tablet (25 mg total) by mouth daily. 06/16/17 01/14/18 Yes Satira Sark,  MD  cloNIDine (CATAPRES) 0.1 MG tablet One and a half tablets at 6 am, then at 2 pm ,and two tablets at 10 pm Patient taking differently: Take by mouth See admin instructions. One and a half tablets at 6 am, then at 2 pm ,and two tablets at 10 pm 04/13/17  Yes [name], MD  docusate sodium (STOOL SOFTENER LAXATIVE) 100 MG capsule Take 100 mg by mouth daily as needed for mild constipation.   Yes [provider]  ferrous sulfate 325 (65 FE) MG EC tablet Take 325 mg by mouth every evening.    Yes [provider]  gabapentin (NEURONTIN) 100 MG capsule TAKE 1 CAPSULE (100 MG TOTAL) BY MOUTH 2 (TWO) TIMES DAILY. 12/07/17  Yes [name], MD  glucose [facility] GLUCOSE) 4 GM  chewable tablet Chew 1-2 tablets by mouth 2 (two) times daily as needed for low blood sugar.  07/29/11  Yes [provider]  Insulin Glargine (LANTUS SOLOSTAR) 100 UNIT/ML Solostar Pen INJECT 70 UNITS INTO THE SKIN AT BEDTIME Patient taking differently: Inject 54 Units into the skin every morning. INJECT 70 UNITS INTO THE SKIN AT BEDTIME 04/28/17  Yes Nida, Marella Chimes, MD  levothyroxine (SYNTHROID, LEVOTHROID) 75 MCG tablet Take 50 mcg by mouth daily before breakfast.  08/05/17  Yes [provider]  nystatin (MYCOSTATIN/NYSTOP) powder APPLY  TOPICALLY  TO  RASH ON LOWER ABDOMEN AT BEDTIME AS NEEDED Patient taking differently: Apply topically at bedtime as needed (for RASH ON LOWER ABDOMEN). APPLY TOPICALLY TO RASH ON LOWER ABDOMEN AT BEDTIME AS NEEDED 09/28/17  Yes [name], MD  olmesartan (BENICAR) 40 MG tablet TAKE 1 TABLET EVERY DAY Patient taking differently: Take 40 mg by mouth at bedtime.  12/07/17  Yes [name], MD  omeprazole (PRILOSEC) 40 MG capsule Take 1 capsule (40 mg total) by mouth daily. 10/26/17  Yes [name], MD  potassium chloride (K-DUR) 10 MEQ tablet Take 1 tablet (10 mEq total) by mouth 2 (two) times daily. 07/11/17  Yes [name], MD  simvastatin (ZOCOR) 10 MG tablet TAKE 1 TABLET AT BEDTIME Patient taking differently: Take 10 mg by mouth at bedtime.  11/15/17  Yes [name], MD  sodium chloride (OCEAN) 0.65 % nasal spray Place 1 spray into the nose daily as needed for congestion.  12/04/13  Yes [provider]  TRADJENTA 5 MG TABS tablet TAKE 1 TABLET EVERY DAY Patient taking differently: Take 5 mg by mouth daily.  01/26/17  Yes Nida, Marella Chimes, MD  traMADol (ULTRAM) 50 MG tablet One tablet two times daily , as needed, for severe back pain Patient taking differently: Take 50 mg by mouth 2 (two) times daily as needed for moderate pain or severe pain (severe back pain).  09/16/17  Yes [name], MD  ACCU-CHEK FASTCLIX LANCETS MISC TEST BLOOD SUGAR FOUR TIMES DAILY 02/22/17   Cassandria Anger, MD  Alcohol Swabs (B-D SINGLE USE SWABS REGULAR) PADS TEST FOUR TIMES A DAY 02/22/17   Nida, Marella Chimes, MD  B-D ULTRAFINE III SHORT PEN 31G X 8 MM MISC USE  TO INJECT EVERY DAY  WITH  LANTUS 11/18/16   Cassandria Anger, MD  glucose blood (ACCU-CHEK SMARTVIEW) test strip TEST 4 TIMES DAILY 03/18/17   Cassandria Anger, MD    Family History Family History  Problem Relation Age of Onset  . Lung cancer Mother   . Hypertension Mother 79  .  Hypertension Brother   . Diabetes Brother   . Diabetes Sister   . Kidney failure Brother   . Hypertension Maternal Grandmother   . Heart disease Maternal Grandmother   . Other Son        stillborn  . Other Son        died at birth  . Diabetes Maternal Grandfather   . Other Son        was murdered    Social History Social History   Tobacco Use  . Smoking status: Former Smoker    Packs/day: 0.50    Years: 15.00    Pack years: 7.50    Types: Cigarettes    Last attempt to quit: 07/07/2001    Years since quitting: 16.5  . Smokeless tobacco: Never Used  Substance Use Topics  . Alcohol use: No    Alcohol/week: 0.0 standard drinks  . Drug use: No     Allergies   Amlodipine; Avandia [rosiglitazone maleate]; Glimepiride; Glipizide; Invokana [canagliflozin]; Lyrica [pregabalin]; Novolog [insulin aspart]; Rosiglitazone; Sitagliptin; and Aspirin   Review of Systems Review of Systems  Constitutional: Negative for appetite change and fatigue.  HENT: Negative for congestion, ear discharge and sinus pressure.   Eyes: Negative for discharge.  Respiratory: Negative for cough.   Cardiovascular: Negative for chest pain.  Gastrointestinal: Negative for abdominal pain and diarrhea.  Genitourinary: Negative for frequency and hematuria.  Musculoskeletal: Negative for back pain.  Skin: Negative for rash.  Neurological: Negative for  seizures and headaches.  Psychiatric/Behavioral: Negative for hallucinations.     Physical Exam Updated Vital Signs BP (!) 167/66   Pulse 80   Temp 98.6 F (37 C) (Oral)   Resp 16   Ht 5\' 1"  (1.549 m)   Wt 77.6 kg   SpO2 100%   BMI 32.31 kg/m   Physical Exam  Constitutional: She is oriented to person, place, and time. She appears well-developed.  HENT:  Head: Normocephalic.  Eyes: Conjunctivae and EOM are normal. No scleral icterus.  Neck: Neck supple. No thyromegaly present.  Cardiovascular: Normal rate and regular rhythm. Exam reveals no gallop and no friction rub.  No murmur heard. Pulmonary/Chest: No stridor. She has no wheezes. She has no rales. She exhibits no tenderness.  Abdominal: She exhibits no distension. There is no tenderness. There is no rebound.  Musculoskeletal: Normal range of motion. She exhibits no edema.  Lymphadenopathy:    She has no cervical adenopathy.  Neurological: She is oriented to person, place, and time. She exhibits normal muscle tone. Coordination normal.  Skin: No rash noted. No erythema.  Psychiatric: She has a normal mood and affect. Her behavior is normal.     ED Treatments / Results  Labs (all labs ordered are listed, but only abnormal results are displayed) Labs Reviewed  CBC WITH DIFFERENTIAL/PLATELET - Abnormal; Notable for the following components:      Result Value   Hemoglobin 10.5 (*)    HCT 35.0 (*)    All other components within normal limits  COMPREHENSIVE METABOLIC PANEL - Abnormal; Notable for the following components:   Glucose, Bld 163 (*)    Creatinine, Ser 1.35 (*)    GFR calc non Af Amer 40 (*)    GFR calc Af Amer 46 (*)    Anion gap 4 (*)    All other components within normal limits  CBG MONITORING, ED - Abnormal; Notable for the following components:   Glucose-Capillary 140 (*)    All other components within  normal limits    EKG None  Radiology No results found.  Procedures Procedures (including  critical care time)  Medications Ordered in ED Medications - No data to display   Initial Impression / Assessment and Plan / ED Course  I have reviewed the triage vital signs and the nursing notes.  Pertinent labs & imaging results that were available during my care of the patient were reviewed by me and considered in my medical decision making (see chart for details).     His glucose here was 163.  I told him to continue taking the insulin as prescribed by her doctor and follow-up with her doctor if any problems.  I told her that sugar of 100 is fine Final Clinical Impressions(s) / ED Diagnoses   Final diagnoses:  Hyperglycemia    ED Discharge Orders    None       Milton Ferguson, MD 01/14/18 1920

## 2018-01-24 ENCOUNTER — Other Ambulatory Visit: Payer: Self-pay | Admitting: Family Medicine

## 2018-01-27 DIAGNOSIS — E119 Type 2 diabetes mellitus without complications: Secondary | ICD-10-CM | POA: Diagnosis not present

## 2018-01-27 DIAGNOSIS — L84 Corns and callosities: Secondary | ICD-10-CM | POA: Diagnosis not present

## 2018-01-27 DIAGNOSIS — M79675 Pain in left toe(s): Secondary | ICD-10-CM | POA: Diagnosis not present

## 2018-01-27 DIAGNOSIS — B351 Tinea unguium: Secondary | ICD-10-CM | POA: Diagnosis not present

## 2018-01-27 DIAGNOSIS — H5200 Hypermetropia, unspecified eye: Secondary | ICD-10-CM | POA: Diagnosis not present

## 2018-02-01 ENCOUNTER — Encounter: Payer: Self-pay | Admitting: Family Medicine

## 2018-02-13 ENCOUNTER — Other Ambulatory Visit: Payer: Self-pay | Admitting: Family Medicine

## 2018-02-28 ENCOUNTER — Ambulatory Visit: Payer: Medicare HMO | Admitting: Family Medicine

## 2018-03-01 DIAGNOSIS — E1122 Type 2 diabetes mellitus with diabetic chronic kidney disease: Secondary | ICD-10-CM | POA: Diagnosis not present

## 2018-03-01 DIAGNOSIS — N183 Chronic kidney disease, stage 3 (moderate): Secondary | ICD-10-CM | POA: Diagnosis not present

## 2018-03-01 DIAGNOSIS — E782 Mixed hyperlipidemia: Secondary | ICD-10-CM | POA: Diagnosis not present

## 2018-03-02 LAB — COMPLETE METABOLIC PANEL WITH GFR
AG RATIO: 1.3 (calc) (ref 1.0–2.5)
ALKALINE PHOSPHATASE (APISO): 63 U/L (ref 33–130)
ALT: 11 U/L (ref 6–29)
AST: 12 U/L (ref 10–35)
Albumin: 4.1 g/dL (ref 3.6–5.1)
BILIRUBIN TOTAL: 0.3 mg/dL (ref 0.2–1.2)
BUN/Creatinine Ratio: 11 (calc) (ref 6–22)
BUN: 17 mg/dL (ref 7–25)
CHLORIDE: 104 mmol/L (ref 98–110)
CO2: 26 mmol/L (ref 20–32)
Calcium: 9.2 mg/dL (ref 8.6–10.4)
Creat: 1.53 mg/dL — ABNORMAL HIGH (ref 0.50–0.99)
GFR, EST AFRICAN AMERICAN: 41 mL/min/{1.73_m2} — AB (ref >=60)
GFR, Est Non African American: 35 mL/min/{1.73_m2} — ABNORMAL LOW (ref >=60)
GLUCOSE: 186 mg/dL — AB (ref 65–99)
Globulin: 3.2 g/dL (calc) (ref 1.9–3.7)
POTASSIUM: 4.3 mmol/L (ref 3.5–5.3)
Sodium: 138 mmol/L (ref 135–146)
TOTAL PROTEIN: 7.3 g/dL (ref 6.1–8.1)

## 2018-03-02 LAB — LIPID PANEL
CHOL/HDL RATIO: 2.9 (calc) (ref <5.0)
Cholesterol: 111 mg/dL (ref <200)
HDL: 38 mg/dL — AB (ref >50)
LDL CHOLESTEROL (CALC): 52 mg/dL
Non-HDL Cholesterol (Calc): 73 mg/dL (calc) (ref <130)
TRIGLYCERIDES: 125 mg/dL (ref <150)

## 2018-03-09 ENCOUNTER — Encounter: Payer: Self-pay | Admitting: Family Medicine

## 2018-03-09 ENCOUNTER — Ambulatory Visit (INDEPENDENT_AMBULATORY_CARE_PROVIDER_SITE_OTHER): Payer: Medicare HMO | Admitting: Family Medicine

## 2018-03-09 VITALS — BP 148/78 | HR 88 | Resp 15 | Ht 61.0 in | Wt 169.0 lb

## 2018-03-09 DIAGNOSIS — E669 Obesity, unspecified: Secondary | ICD-10-CM

## 2018-03-09 DIAGNOSIS — I10 Essential (primary) hypertension: Secondary | ICD-10-CM | POA: Diagnosis not present

## 2018-03-09 DIAGNOSIS — Z23 Encounter for immunization: Secondary | ICD-10-CM | POA: Diagnosis not present

## 2018-03-09 DIAGNOSIS — Z780 Asymptomatic menopausal state: Secondary | ICD-10-CM | POA: Diagnosis not present

## 2018-03-09 DIAGNOSIS — E1122 Type 2 diabetes mellitus with diabetic chronic kidney disease: Secondary | ICD-10-CM | POA: Diagnosis not present

## 2018-03-09 DIAGNOSIS — N183 Chronic kidney disease, stage 3 unspecified: Secondary | ICD-10-CM

## 2018-03-09 DIAGNOSIS — E782 Mixed hyperlipidemia: Secondary | ICD-10-CM

## 2018-03-09 MED ORDER — CLONIDINE HCL 0.2 MG PO TABS: 0.2000 mg | ORAL_TABLET | Freq: Two times a day (BID) | ORAL | 3 refills | Status: AC

## 2018-03-09 NOTE — Assessment & Plan Note (Signed)
Uncontrolled, increase clonidine dose DASH diet and commitment to daily physical activity for a minimum of 30 minutes discussed and encouraged, as a part of hypertension management. The importance of attaining a healthy weight is also discussed.  BP/Weight 03/09/2018 01/14/2018 01/05/2018 11/17/2017 11/14/2017 10/29/2017 07/15/9045  Systolic BP 533 917 921 783 754 237 023  Diastolic BP 78 71 80 92 72 70 86  Wt. (Lbs) 169 171 171 171 171.8 169 169  BMI 31.93 32.31 32.31 32.31 32.46 31.42 31.42

## 2018-03-09 NOTE — Patient Instructions (Addendum)
F/U in 5 months with MD, call if you need me before  Fasting lipid, cmp and EGFGr, CBC, and vit D 1 week before May appt with MD  Mathews [place] with nurse due in February , please schedule   It is important that you exercise regularly at least 30 minutes 5 times a week. If you develop chest pain, have severe difficulty breathing, or feel very tired, stop exercising immediately and seek medical attention     Blood pressure much improved, but have to increase clonidine to 0.2 mg  two times daily. You may take clonidine 0.1 mg two tablets two times daily , 7am and 7 pm until you get the 0.2 mg tablet   Cholesterol is good  Nurse to  Send for hBa1C and notes from Endo,   Thanks for choosing [facility], we consider it a privelige to serve you.

## 2018-03-13 ENCOUNTER — Other Ambulatory Visit: Payer: Self-pay

## 2018-03-13 ENCOUNTER — Telehealth: Payer: Self-pay | Admitting: Family Medicine

## 2018-03-13 DIAGNOSIS — G8929 Other chronic pain: Secondary | ICD-10-CM

## 2018-03-13 DIAGNOSIS — M5441 Lumbago with sciatica, right side: Principal | ICD-10-CM

## 2018-03-13 MED ORDER — GABAPENTIN 100 MG PO CAPS: 100.0000 mg | ORAL_CAPSULE | Freq: Two times a day (BID) | ORAL | 3 refills | Status: AC

## 2018-03-13 NOTE — Telephone Encounter (Signed)
Please call in Gabapentin to [facility]

## 2018-03-13 NOTE — Progress Notes (Signed)
Gabapentin ordered

## 2018-03-13 NOTE — Telephone Encounter (Signed)
Done

## 2018-03-15 NOTE — Progress Notes (Signed)
HBA1C entered

## 2018-03-24 ENCOUNTER — Encounter: Payer: Self-pay | Admitting: Family Medicine

## 2018-03-24 NOTE — Assessment & Plan Note (Signed)
Improved but still not at goal, managed by endo Ms. Shortridge is reminded of the importance of commitment to daily physical activity for 30 minutes or more, as able and the need to limit carbohydrate intake to 30 to 60 grams per meal to help with blood sugar control.   The need to take medication as prescribed, test blood sugar as directed, and to call between visits if there is a concern that blood sugar is uncontrolled is also discussed.   Ms. Corter is reminded of the importance of daily foot exam, annual eye examination, and good blood sugar, blood pressure and cholesterol control.  Diabetic Labs Latest Ref Rng & Units 03/01/2018 01/14/2018 01/13/2018 11/17/2017 10/29/2017  HbA1c 4.0 - 6.0 - - 8.3(A) - -  Microalbumin Not estab mg/dL - - - - -  Micro/Creat Ratio <30 mcg/mg creat - - - - -  Chol <200 mg/dL 111 - - - -  HDL >50 mg/dL 38(L) - - - -  Calc LDL mg/dL (calc) 52 - - - -  Triglycerides <150 mg/dL 125 - - - -  Creatinine 0.50 - 0.99 mg/dL 1.53(H) 1.35(H) - 1.23(H) 1.29(H)   BP/Weight 03/09/2018 01/14/2018 01/05/2018 11/17/2017 11/14/2017 10/29/2017 4/49/2010  Systolic BP 071 219 758 832 549 826 415  Diastolic BP 78 71 80 92 72 70 86  Wt. (Lbs) 169 171 171 171 171.8 169 169  BMI 31.93 32.31 32.31 32.31 32.46 31.42 31.42   Foot/eye exam completion dates Latest Ref Rng & Units 10/26/2017 08/26/2016  Eye Exam No Retinopathy - -  Foot exam Order - - -  Foot Form Completion - Done Done

## 2018-03-24 NOTE — Progress Notes (Signed)
Christine Dominguez     MRN: [id number]      DOB: [birth year]   HPI Christine Dominguez is here for follow up and re-evaluation of chronic medical conditions, medication management and review of any available recent lab and radiology data.  Preventive health is updated, specifically  Cancer screening and Immunization.   Questions or concerns regarding consultations or procedures which the PT has had in the interim are  addressed. The PT denies any adverse reactions to current medications since the last visit.  There are no new concerns.  There are no specific complaints  Denies polyuria, polydipsia, blurred vision , or hypoglycemic episodes.   ROS Denies recent fever or chills. Denies sinus pressure, nasal congestion, ear pain or sore throat. Denies chest congestion, productive cough or wheezing. Denies chest pains, palpitations and leg swelling Denies abdominal pain, nausea, vomiting,diarrhea or constipation.   Denies dysuria, frequency, hesitancy or incontinence. Denies joint pain, swelling and limitation in mobility. Denies headaches, seizures, numbness, or tingling. Denies depression, anxiety or insomnia. Denies skin break down or rash.   PE  BP (!) 148/78   Pulse 88   Resp 15   Ht 5\' 1"  (1.549 m)   Wt 169 lb (76.7 kg)   SpO2 97%   BMI 31.93 kg/m   Patient alert and oriented and in no cardiopulmonary distress.  HEENT: No facial asymmetry, EOMI,   oropharynx pink and moist.  Neck supple no JVD, no mass.  Chest: Clear to auscultation bilaterally.  CVS: S1, S2 no murmurs, no S3.Regular rate.  ABD: Soft non tender.   Ext: No edema  MS: Adequate ROM spine, shoulders, hips and knees.  Skin: Intact, no ulcerations or rash noted.  Psych: Good eye contact, normal affect. Memory intact not anxious or depressed appearing.  CNS: CN 2-12 intact, power,  normal throughout.no focal deficits noted.   Assessment & Plan  Malignant hypertension Uncontrolled,  increase clonidine dose DASH diet and commitment to daily physical activity for a minimum of 30 minutes discussed and encouraged, as a part of hypertension management. The importance of attaining a healthy weight is also discussed.  BP/Weight 03/09/2018 01/14/2018 01/05/2018 11/17/2017 11/14/2017 10/29/2017 08/21/8414  Systolic BP 606 301 601 093 235 573 220  Diastolic BP 78 71 80 92 72 70 86  Wt. (Lbs) 169 171 171 171 171.8 169 169  BMI 31.93 32.31 32.31 32.31 32.46 31.42 31.42       Obesity (BMI 30.0-34.9) Unchanged. Patient re-educated about  the importance of commitment to a  minimum of 150 minutes of exercise per week.  The importance of healthy food choices with portion control discussed. Encouraged to start a food diary, count calories and to consider  joining a support group. Sample diet sheets offered. Goals set by the patient for the next several months.   Weight /BMI 03/09/2018 01/14/2018 01/05/2018  WEIGHT 169 lb 171 lb 171 lb  HEIGHT 5\' 1"  5\' 1"  5\' 1"   BMI 31.93 kg/m2 32.31 kg/m2 32.31 kg/m2      Mixed hyperlipidemia Hyperlipidemia:Low fat diet discussed and encouraged.   Lipid Panel  Lab Results  Component Value Date   CHOL 111 03/01/2018   HDL 38 (L) 03/01/2018   LDLCALC 52 03/01/2018   TRIG 125 03/01/2018   CHOLHDL 2.9 03/01/2018   Needs to increase exercise to improve HDL    Diabetes mellitus with stage 3 chronic kidney disease (HCC) Improved but still not at goal, managed by endo Christine Dominguez is reminded of the  importance of commitment to daily physical activity for 30 minutes or more, as able and the need to limit carbohydrate intake to 30 to 60 grams per meal to help with blood sugar control.   The need to take medication as prescribed, test blood sugar as directed, and to call between visits if there is a concern that blood sugar is uncontrolled is also discussed.   Christine Dominguez is reminded of the importance of daily foot exam, annual  eye examination, and good blood sugar, blood pressure and cholesterol control.  Diabetic Labs Latest Ref Rng & Units 03/01/2018 01/14/2018 01/13/2018 11/17/2017 10/29/2017  HbA1c 4.0 - 6.0 - - 8.3(A) - -  Microalbumin Not estab mg/dL - - - - -  Micro/Creat Ratio <30 mcg/mg creat - - - - -  Chol <200 mg/dL 111 - - - -  HDL >50 mg/dL 38(L) - - - -  Calc LDL mg/dL (calc) 52 - - - -  Triglycerides <150 mg/dL 125 - - - -  Creatinine 0.50 - 0.99 mg/dL 1.53(H) 1.35(H) - 1.23(H) 1.29(H)   BP/Weight 03/09/2018 01/14/2018 01/05/2018 11/17/2017 11/14/2017 10/29/2017 7/67/3419  Systolic BP 379 024 097 353 299 242 683  Diastolic BP 78 71 80 92 72 70 86  Wt. (Lbs) 169 171 171 171 171.8 169 169  BMI 31.93 32.31 32.31 32.31 32.46 31.42 31.42   Foot/eye exam completion dates Latest Ref Rng & Units 10/26/2017 08/26/2016  Eye Exam No Retinopathy - -  Foot exam Order - - -  Foot Form Completion - Done Done

## 2018-03-24 NOTE — Assessment & Plan Note (Signed)
After obtaining informed consent, the vaccine is  administered by LPN.  

## 2018-03-24 NOTE — Assessment & Plan Note (Signed)
Hyperlipidemia:Low fat diet discussed and encouraged.   Lipid Panel  Lab Results  Component Value Date   CHOL 111 03/01/2018   HDL 38 (L) 03/01/2018   LDLCALC 52 03/01/2018   TRIG 125 03/01/2018   CHOLHDL 2.9 03/01/2018   Needs to increase exercise to improve HDL

## 2018-03-24 NOTE — Assessment & Plan Note (Signed)
Unchanged. Patient re-educated about  the importance of commitment to a  minimum of 150 minutes of exercise per week.  The importance of healthy food choices with portion control discussed. Encouraged to start a food diary, count calories and to consider  joining a support group. Sample diet sheets offered. Goals set by the patient for the next several months.   Weight /BMI 03/09/2018 01/14/2018 01/05/2018  WEIGHT 169 lb 171 lb 171 lb  HEIGHT 5\' 1"  5\' 1"  5\' 1"   BMI 31.93 kg/m2 32.31 kg/m2 32.31 kg/m2

## 2018-03-29 ENCOUNTER — Encounter (HOSPITAL_COMMUNITY): Payer: Self-pay

## 2018-03-29 ENCOUNTER — Other Ambulatory Visit: Payer: Self-pay

## 2018-03-29 ENCOUNTER — Emergency Department (HOSPITAL_COMMUNITY)
Admission: EM | Admit: 2018-03-29 | Discharge: 2018-03-30 | Disposition: A | Discharge status: Home / Self Care | Payer: Medicare HMO | Attending: Emergency Medicine | Admitting: Emergency Medicine

## 2018-03-29 DIAGNOSIS — I129 Hypertensive chronic kidney disease with stage 1 through stage 4 chronic kidney disease, or unspecified chronic kidney disease: Secondary | ICD-10-CM | POA: Diagnosis not present

## 2018-03-29 DIAGNOSIS — Z87891 Personal history of nicotine dependence: Secondary | ICD-10-CM | POA: Diagnosis not present

## 2018-03-29 DIAGNOSIS — Y939 Activity, unspecified: Secondary | ICD-10-CM | POA: Diagnosis not present

## 2018-03-29 DIAGNOSIS — N183 Chronic kidney disease, stage 3 (moderate): Secondary | ICD-10-CM | POA: Insufficient documentation

## 2018-03-29 DIAGNOSIS — Y999 Unspecified external cause status: Secondary | ICD-10-CM | POA: Diagnosis not present

## 2018-03-29 DIAGNOSIS — Z79899 Other long term (current) drug therapy: Secondary | ICD-10-CM | POA: Insufficient documentation

## 2018-03-29 DIAGNOSIS — Y929 Unspecified place or not applicable: Secondary | ICD-10-CM | POA: Insufficient documentation

## 2018-03-29 DIAGNOSIS — S46811A Strain of other muscles, fascia and tendons at shoulder and upper arm level, right arm, initial encounter: Secondary | ICD-10-CM

## 2018-03-29 DIAGNOSIS — X58XXXA Exposure to other specified factors, initial encounter: Secondary | ICD-10-CM | POA: Insufficient documentation

## 2018-03-29 DIAGNOSIS — G8929 Other chronic pain: Secondary | ICD-10-CM | POA: Insufficient documentation

## 2018-03-29 DIAGNOSIS — S29012A Strain of muscle and tendon of back wall of thorax, initial encounter: Secondary | ICD-10-CM | POA: Diagnosis not present

## 2018-03-29 DIAGNOSIS — Z859 Personal history of malignant neoplasm, unspecified: Secondary | ICD-10-CM | POA: Diagnosis not present

## 2018-03-29 DIAGNOSIS — E119 Type 2 diabetes mellitus without complications: Secondary | ICD-10-CM | POA: Insufficient documentation

## 2018-03-29 DIAGNOSIS — E039 Hypothyroidism, unspecified: Secondary | ICD-10-CM | POA: Insufficient documentation

## 2018-03-29 DIAGNOSIS — M549 Dorsalgia, unspecified: Secondary | ICD-10-CM | POA: Diagnosis not present

## 2018-03-29 DIAGNOSIS — M542 Cervicalgia: Secondary | ICD-10-CM | POA: Diagnosis present

## 2018-03-29 LAB — CBG MONITORING, ED: Glucose-Capillary: 143 mg/dL — ABNORMAL HIGH (ref 70–99)

## 2018-03-29 MED ORDER — CYCLOBENZAPRINE HCL 10 MG PO TABS
5.0000 mg | ORAL_TABLET | Freq: Once | ORAL | Status: AC
  Administered 2018-03-29: 5 mg via ORAL
  Filled 2018-03-29: qty 1

## 2018-03-29 NOTE — ED Triage Notes (Signed)
Neck pain began yesterday, muscle relaxer taken yesterday evening with no relief. Denies injury

## 2018-03-29 NOTE — ED Notes (Signed)
Pt reports R neck pain   States she took her BP meds just as she arrived here   Also states that she took a muscle relaxant that did not help her neck pain

## 2018-03-29 NOTE — ED Notes (Signed)
Pt reports that when she ambulates her pressures go up   When she stops, and rest, they go down

## 2018-03-30 MED ORDER — METHOCARBAMOL 500 MG PO TABS: 500.0000 mg | ORAL_TABLET | Freq: Three times a day (TID) | ORAL | 0 refills | Status: AC | PRN

## 2018-03-30 NOTE — Discharge Instructions (Addendum)
Use ice and heat as needed for comfort.  Take the medication as prescribed.  Follow-up with Dr. Moshe Cipro if not improving over the next couple of days.

## 2018-03-30 NOTE — ED Notes (Signed)
Pt request crackers   provided

## 2018-03-30 NOTE — ED Provider Notes (Signed)
[facility] EMERGENCY DEPARTMENT Provider Note   CSN: [id number] Arrival date & time: 03/29/18  2042  Time seen 11:40 PM   History   Chief Complaint Neck pain  HPI Christine Dominguez is a 67 y.o. female.  HPI patient states 10 PM yesterday evening she was watching TV when she started having pain in her right neck muscle and points to her trapezius muscle.  She states turning her head, sitting and laying flat makes the pain worse, propping pillows under her muscle makes it feel better and putting pressure on it.  She states using ice and heat did not help.  She states she feels like she is getting spasms in the neck.  She states she took a "muscle relaxer" but it was tramadol without relief.  She states she has had this before many years ago.  She denies any change of her activity or any injury.  She denies any numbness or weakness in her hand.  Patient states she has a pinched nerve in her left buttock and has chronic back pain.  That is managed by her PCP.   PCP [name], MD   Past Medical History:  Diagnosis Date  . Anemia   . Arthritis    Cervical spine  . Cancer ([place]) 1998, recurred in 2012   Paraganglioma of jugular tympamicum, recurrent in 2012  . Chronic back pain 2007   Disabled   . Essential hypertension, benign 1980  . GERD (gastroesophageal reflux disease) 2000  . Hydrocephalus (HCC)    Treated with VP shunt and then intracranial tumor resection (?Cholesteatoma); no longer followed actively by neurosurgery or otolaryngology  . Hyperlipidemia 2013  . Hypothyroidism 2013  . Neuropathy   . Obesity   . Type 2 diabetes mellitus ([place] [place]) 1991    Patient Active Problem List   Diagnosis Date Noted  . Obesity (BMI 30.0-34.9) 11/06/2017  . Chronic back pain greater than 3 months duration 11/27/2016  . Need for immunization against influenza 01/19/2014  . Rectocele 12/31/2013  . Paraganglioma ([place]) 08/01/2013  . Primary hypothyroidism 11/14/2011  .  Mixed hyperlipidemia 11/14/2011  . GERD (gastroesophageal reflux disease) 11/03/2011  . Hearing loss in right ear 11/24/2010  . Diabetes mellitus with stage 3 chronic kidney disease ([place]) 06/11/2010  . Back pain 11/18/2009  . CARPAL TUNNEL SYNDROME 10/19/2009  . Malignant hypertension 10/13/2009    Past Surgical History:  Procedure Laterality Date  . CATARACT EXTRACTION W/PHACO Right 07/17/2013   Procedure: CATARACT EXTRACTION PHACO AND INTRAOCULAR LENS PLACEMENT (IOC);  Surgeon: [name], MD;  Location: [facility];  Service: Ophthalmology;  Laterality: Right;  CDE:7.24  . COLONOSCOPY  06/2010  . Hemorrhoidectomy    . Intracranial mass resected  1998   At [facility]; ? location-right middle ear  . Teeth pulled    . TRIGGER FINGER RELEASE     Rght thumb  . TUBAL LIGATION  1980  . TUBAL LIGATION    . VENTRICULOPERITONEAL SHUNT  1998   At [facility]     OB History    Gravida  3   Para  2   Term      Preterm      AB  1   Living  1     SAB      TAB      Ectopic  1   Multiple      Live Births  1            Home Medications  Prior to Admission medications   Medication Sig Start Date End Date Taking? Authorizing Provider  ACCU-CHEK FASTCLIX LANCETS MISC TEST BLOOD SUGAR FOUR TIMES DAILY 02/22/17   Cassandria Anger, MD  Alcohol Swabs (B-D SINGLE USE SWABS REGULAR) PADS TEST FOUR TIMES A DAY 02/22/17   Nida, Marella Chimes, MD  B-D ULTRAFINE III SHORT PEN 31G X 8 MM MISC USE  TO INJECT EVERY DAY  WITH  LANTUS 11/18/16   Cassandria Anger, MD  cloNIDine (CATAPRES) 0.2 MG tablet Take 1 tablet (0.2 mg total) by mouth 2 (two) times daily. 03/09/18   [name], MD  docusate sodium (STOOL SOFTENER LAXATIVE) 100 MG capsule Take 100 mg by mouth daily as needed for mild constipation.    [provider]  ferrous sulfate 325 (65 FE) MG EC tablet Take 325 mg by mouth every evening.     [provider]  gabapentin (NEURONTIN) 100 MG capsule Take 1  capsule (100 mg total) by mouth 2 (two) times daily. 03/13/18   [name], MD  glucose blood (ACCU-CHEK SMARTVIEW) test strip TEST 4 TIMES DAILY 03/18/17   Cassandria Anger, MD  Insulin Glargine (LANTUS SOLOSTAR) 100 UNIT/ML Solostar Pen INJECT 70 UNITS INTO THE SKIN AT BEDTIME Patient taking differently: Inject 54 Units into the skin every morning. INJECT 70 UNITS INTO THE SKIN AT BEDTIME 04/28/17   Cassandria Anger, MD  methocarbamol (ROBAXIN) 500 MG tablet Take 1 tablet (500 mg total) by mouth every 8 (eight) hours as needed for muscle spasms (muscle pain). 03/30/18   Rolland Porter, MD  nystatin (MYCOSTATIN/NYSTOP) powder APPLY  TOPICALLY  TO  RASH ON LOWER ABDOMEN AT BEDTIME AS NEEDED 02/14/18   [name], MD  olmesartan (BENICAR) 40 MG tablet TAKE 1 TABLET EVERY DAY Patient taking differently: Take 40 mg by mouth at bedtime.  12/07/17   [name], MD  omeprazole (PRILOSEC) 40 MG capsule Take 1 capsule (40 mg total) by mouth daily. 10/26/17   [name], MD  potassium chloride (K-DUR) 10 MEQ tablet Take 1 tablet (10 mEq total) by mouth 2 (two) times daily. 07/11/17   [name], MD  simvastatin (ZOCOR) 10 MG tablet TAKE 1 TABLET AT BEDTIME Patient taking differently: Take 10 mg by mouth at bedtime.  11/15/17   [name], MD  sodium chloride (OCEAN) 0.65 % nasal spray Place 1 spray into the nose daily as needed for congestion.  12/04/13   [provider]  TRADJENTA 5 MG TABS tablet TAKE 1 TABLET EVERY DAY Patient taking differently: Take 5 mg by mouth daily.  01/26/17   Cassandria Anger, MD  traMADol (ULTRAM) 50 MG tablet One tablet two times daily , as needed, for severe back pain Patient taking differently: Take 50 mg by mouth 2 (two) times daily as needed for moderate pain or severe pain (severe back pain).  09/16/17   [name], MD    Family History Family History  Problem Relation Age of Onset  . Lung cancer  Mother   . Hypertension Mother 55  . Hypertension Brother   . Diabetes Brother   . Diabetes Sister   . Kidney failure Brother   . Hypertension Maternal Grandmother   . Heart disease Maternal Grandmother   . Other Son        stillborn  . Other Son        died at birth  . Diabetes Maternal Grandfather   .  Other Son        was murdered    Social History Social History   Tobacco Use  . Smoking status: Former Smoker    Packs/day: 0.50    Years: 15.00    Pack years: 7.50    Types: Cigarettes    Last attempt to quit: 07/07/2001    Years since quitting: 16.7  . Smokeless tobacco: Never Used  Substance Use Topics  . Alcohol use: No    Alcohol/week: 0.0 standard drinks  . Drug use: No  Uses a cane when her back flares up Lives home alone   Allergies   Amlodipine; Avandia [rosiglitazone maleate]; Glimepiride; Glipizide; Invokana [canagliflozin]; Lyrica [pregabalin]; Novolog [insulin aspart]; Rosiglitazone; Sitagliptin; and Aspirin   Review of Systems Review of Systems  All other systems reviewed and are negative.    Physical Exam Updated Vital Signs BP (!) 176/68   Pulse 78   Temp 98.3 F (36.8 C) (Oral)   Resp 15   Ht '5\' 1"'$  (1.549 m)   Wt 76.6 kg   SpO2 100%   BMI 31.91 kg/m   Vital signs normal except hypertension   Physical Exam Vitals signs and nursing note reviewed.  Constitutional:      General: She is not in acute distress.    Appearance: Normal appearance. She is well-developed. She is not ill-appearing or toxic-appearing.  HENT:     Head: Normocephalic and atraumatic.     Right Ear: External ear normal.     Left Ear: External ear normal.     Nose: Nose normal. No mucosal edema or rhinorrhea.     Mouth/Throat:     Dentition: No dental abscesses.     Pharynx: No uvula swelling.  Eyes:     Conjunctiva/sclera: Conjunctivae normal.     Pupils: Pupils are equal, round, and reactive to light.  Neck:     Musculoskeletal: Full passive range of motion  without pain, normal range of motion and neck supple. Muscular tenderness present.     Comments: Patient is nontender in the midline cervical spine, she is nontender in the immediate paraspinous muscles of the cervical spine.  However she is tender to the superior portion of the right trapezius that reproduces her complaints of pain.  She has pain when she moves her head from left to right and it is worse when she looks to the left.  She does not have pain on movement of her right upper extremity. Cardiovascular:     Rate and Rhythm: Normal rate and regular rhythm.     Heart sounds: Normal heart sounds. No murmur. No friction rub. No gallop.   Pulmonary:     Effort: Pulmonary effort is normal. No respiratory distress.  Chest:     Chest wall: No crepitus.  Musculoskeletal: Normal range of motion.        General: No tenderness.     Comments: Moves all extremities well.   Skin:    General: Skin is warm and dry.     Coloration: Skin is not pale.     Findings: No erythema or rash.  Neurological:     General: No focal deficit present.     Mental Status: She is alert and oriented to person, place, and time.     Cranial Nerves: No cranial nerve deficit.  Psychiatric:        Mood and Affect: Mood normal. Mood is not anxious.        Speech: Speech normal.  Behavior: Behavior normal.        Thought Content: Thought content normal.      ED Treatments / Results  Labs (all labs ordered are listed, but only abnormal results are displayed) Labs Reviewed  CBG MONITORING, ED - Abnormal; Notable for the following components:      Result Value   Glucose-Capillary 143 (*)    All other components within normal limits   Laboratory interpretation all normal except mild hyperglycemia   EKG None  Radiology No results found.  Procedures Procedures (including critical care time)  Medications Ordered in ED Medications  cyclobenzaprine (FLEXERIL) tablet 5 mg (5 mg Oral Given 03/29/18  2353)     Initial Impression / Assessment and Plan / ED Course  I have reviewed the triage vital signs and the nursing notes.  Pertinent labs & imaging results that were available during my care of the patient were reviewed by me and considered in my medical decision making (see chart for details).    Patient was given Flexeril for her complaints of pain.  CBG was done.  When I look at her lab work she had a c-Met done on November 27, her BUN was 17 however her creatinine was 1.53.  I did not give her a nonsteroidal anti-inflammatory drug, I was considering putting her on steroids however with her diabetes I am a little bit reluctant.  Recheck at 2 AM patient states she is feeling much better.  She was discharged home with Robaxin, she was advised to continue using the ice and heat for comfort.  Final Clinical Impressions(s) / ED Diagnoses   Final diagnoses:  Trapezius muscle strain, right, initial encounter    ED Discharge Orders         Ordered    methocarbamol (ROBAXIN) 500 MG tablet  Every 8 hours PRN     03/30/18 0209         Plan discharge  Rolland Porter, MD, Barbette Or, MD 03/30/18 [phone]

## 2018-04-03 ENCOUNTER — Other Ambulatory Visit: Payer: Self-pay | Admitting: Family Medicine

## 2018-04-03 ENCOUNTER — Encounter: Payer: Self-pay | Admitting: Family Medicine

## 2018-04-06 ENCOUNTER — Telehealth: Payer: Self-pay | Admitting: Family Medicine

## 2018-04-06 NOTE — Telephone Encounter (Signed)
Pls arrange referral to physical therapy per her message . ?? pls ask!

## 2018-04-10 ENCOUNTER — Other Ambulatory Visit: Payer: Self-pay | Admitting: "Endocrinology

## 2018-04-12 DIAGNOSIS — M2560 Stiffness of unspecified joint, not elsewhere classified: Secondary | ICD-10-CM | POA: Diagnosis not present

## 2018-04-12 DIAGNOSIS — M546 Pain in thoracic spine: Secondary | ICD-10-CM | POA: Diagnosis not present

## 2018-04-12 DIAGNOSIS — M25511 Pain in right shoulder: Secondary | ICD-10-CM | POA: Diagnosis not present

## 2018-04-12 DIAGNOSIS — M542 Cervicalgia: Secondary | ICD-10-CM | POA: Diagnosis not present

## 2018-04-12 NOTE — Telephone Encounter (Signed)
Done

## 2018-04-14 DIAGNOSIS — M2560 Stiffness of unspecified joint, not elsewhere classified: Secondary | ICD-10-CM | POA: Diagnosis not present

## 2018-04-14 DIAGNOSIS — M542 Cervicalgia: Secondary | ICD-10-CM | POA: Diagnosis not present

## 2018-04-14 DIAGNOSIS — M25511 Pain in right shoulder: Secondary | ICD-10-CM | POA: Diagnosis not present

## 2018-04-14 DIAGNOSIS — M546 Pain in thoracic spine: Secondary | ICD-10-CM | POA: Diagnosis not present

## 2018-04-17 DIAGNOSIS — M25511 Pain in right shoulder: Secondary | ICD-10-CM | POA: Diagnosis not present

## 2018-04-17 DIAGNOSIS — E1165 Type 2 diabetes mellitus with hyperglycemia: Secondary | ICD-10-CM | POA: Diagnosis not present

## 2018-04-17 DIAGNOSIS — Z794 Long term (current) use of insulin: Secondary | ICD-10-CM | POA: Diagnosis not present

## 2018-04-17 DIAGNOSIS — M546 Pain in thoracic spine: Secondary | ICD-10-CM | POA: Diagnosis not present

## 2018-04-17 DIAGNOSIS — M542 Cervicalgia: Secondary | ICD-10-CM | POA: Diagnosis not present

## 2018-04-17 DIAGNOSIS — E039 Hypothyroidism, unspecified: Secondary | ICD-10-CM | POA: Diagnosis not present

## 2018-04-17 DIAGNOSIS — M2560 Stiffness of unspecified joint, not elsewhere classified: Secondary | ICD-10-CM | POA: Diagnosis not present

## 2018-04-18 DIAGNOSIS — M542 Cervicalgia: Secondary | ICD-10-CM | POA: Diagnosis not present

## 2018-04-18 DIAGNOSIS — M546 Pain in thoracic spine: Secondary | ICD-10-CM | POA: Diagnosis not present

## 2018-04-18 DIAGNOSIS — M2560 Stiffness of unspecified joint, not elsewhere classified: Secondary | ICD-10-CM | POA: Diagnosis not present

## 2018-04-18 DIAGNOSIS — M25511 Pain in right shoulder: Secondary | ICD-10-CM | POA: Diagnosis not present

## 2018-04-20 DIAGNOSIS — M542 Cervicalgia: Secondary | ICD-10-CM | POA: Diagnosis not present

## 2018-04-20 DIAGNOSIS — M25511 Pain in right shoulder: Secondary | ICD-10-CM | POA: Diagnosis not present

## 2018-04-20 DIAGNOSIS — M546 Pain in thoracic spine: Secondary | ICD-10-CM | POA: Diagnosis not present

## 2018-04-20 DIAGNOSIS — M2560 Stiffness of unspecified joint, not elsewhere classified: Secondary | ICD-10-CM | POA: Diagnosis not present

## 2018-04-25 DIAGNOSIS — M546 Pain in thoracic spine: Secondary | ICD-10-CM | POA: Diagnosis not present

## 2018-04-25 DIAGNOSIS — M25511 Pain in right shoulder: Secondary | ICD-10-CM | POA: Diagnosis not present

## 2018-04-25 DIAGNOSIS — M2560 Stiffness of unspecified joint, not elsewhere classified: Secondary | ICD-10-CM | POA: Diagnosis not present

## 2018-04-25 DIAGNOSIS — M542 Cervicalgia: Secondary | ICD-10-CM | POA: Diagnosis not present

## 2018-04-27 DIAGNOSIS — M25511 Pain in right shoulder: Secondary | ICD-10-CM | POA: Diagnosis not present

## 2018-04-27 DIAGNOSIS — M546 Pain in thoracic spine: Secondary | ICD-10-CM | POA: Diagnosis not present

## 2018-04-27 DIAGNOSIS — M542 Cervicalgia: Secondary | ICD-10-CM | POA: Diagnosis not present

## 2018-04-27 DIAGNOSIS — M2560 Stiffness of unspecified joint, not elsewhere classified: Secondary | ICD-10-CM | POA: Diagnosis not present

## 2018-05-02 DIAGNOSIS — M2560 Stiffness of unspecified joint, not elsewhere classified: Secondary | ICD-10-CM | POA: Diagnosis not present

## 2018-05-02 DIAGNOSIS — M25511 Pain in right shoulder: Secondary | ICD-10-CM | POA: Diagnosis not present

## 2018-05-02 DIAGNOSIS — M546 Pain in thoracic spine: Secondary | ICD-10-CM | POA: Diagnosis not present

## 2018-05-02 DIAGNOSIS — M542 Cervicalgia: Secondary | ICD-10-CM | POA: Diagnosis not present

## 2018-05-04 DIAGNOSIS — M2560 Stiffness of unspecified joint, not elsewhere classified: Secondary | ICD-10-CM | POA: Diagnosis not present

## 2018-05-04 DIAGNOSIS — M25511 Pain in right shoulder: Secondary | ICD-10-CM | POA: Diagnosis not present

## 2018-05-04 DIAGNOSIS — M546 Pain in thoracic spine: Secondary | ICD-10-CM | POA: Diagnosis not present

## 2018-05-04 DIAGNOSIS — M542 Cervicalgia: Secondary | ICD-10-CM | POA: Diagnosis not present

## 2018-05-09 DIAGNOSIS — M546 Pain in thoracic spine: Secondary | ICD-10-CM | POA: Diagnosis not present

## 2018-05-09 DIAGNOSIS — M2560 Stiffness of unspecified joint, not elsewhere classified: Secondary | ICD-10-CM | POA: Diagnosis not present

## 2018-05-09 DIAGNOSIS — M25511 Pain in right shoulder: Secondary | ICD-10-CM | POA: Diagnosis not present

## 2018-05-09 DIAGNOSIS — M542 Cervicalgia: Secondary | ICD-10-CM | POA: Diagnosis not present

## 2018-05-10 ENCOUNTER — Other Ambulatory Visit: Payer: Self-pay | Admitting: Family Medicine

## 2018-05-11 NOTE — Progress Notes (Signed)
Cardiology Office Note  Date: 05/15/2018   ID: Christine Dominguez, DOB 1950/07/18, MRN [id number]  PCP: [name], MD  Primary Cardiologist: [name], MD   Chief Complaint  Patient presents with  . Hypertension    History of Present Illness: Christine Dominguez is a 68 y.o. female last seen in August 2019.  She is here for a routine visit.  We went over her antihypertensive medications which are outlined below.  She reports compliance, blood pressure is actually better controlled today in the last few visits.  She does not report any chest pain or shortness of breath with typical activities.  We discussed her previously reported palpitations, she has not had any major recurring events, no dizziness or syncope.  Mains on Zocor, recent lab work in November 2019 showed LDL 52.  Otherwise following diabetes with an endocrinologist through [facility].  Past Medical History:  Diagnosis Date  . Anemia   . Arthritis    Cervical spine  . Cancer ([facility]) 1998, recurred in 2012   Paraganglioma of jugular tympamicum, recurrent in 2012  . Chronic back pain 2007   Disabled   . Essential hypertension, benign 1980  . GERD (gastroesophageal reflux disease) 2000  . Hydrocephalus ([facility])    Treated with VP shunt and then intracranial tumor resection (?Cholesteatoma); no longer followed actively by neurosurgery or otolaryngology  . Hyperlipidemia 2013  . Hypothyroidism 2013  . Neuropathy   . Obesity   . Type 2 diabetes mellitus ([place]) 1991    Past Surgical History:  Procedure Laterality Date  . CATARACT EXTRACTION W/PHACO Right 07/17/2013   Procedure: CATARACT EXTRACTION PHACO AND INTRAOCULAR LENS PLACEMENT (IOC);  Surgeon: [name], MD;  Location: [facility];  Service: Ophthalmology;  Laterality: Right;  CDE:7.24  . COLONOSCOPY  06/2010  . Hemorrhoidectomy    . Intracranial mass resected  1998   At [facility]; ? location-right middle ear  . Teeth pulled    . TRIGGER  FINGER RELEASE     Rght thumb  . TUBAL LIGATION  1980  . TUBAL LIGATION    . VENTRICULOPERITONEAL SHUNT  1998   At [facility]    Current Outpatient Medications  Medication Sig Dispense Refill  . ACCU-CHEK FASTCLIX LANCETS MISC TEST BLOOD SUGAR FOUR TIMES DAILY 408 each 1  . Alcohol Swabs (B-D SINGLE USE SWABS REGULAR) PADS TEST FOUR TIMES A DAY 400 each 2  . cloNIDine (CATAPRES) 0.2 MG tablet Take 1 tablet (0.2 mg total) by mouth 2 (two) times daily. 180 tablet 3  . docusate sodium (STOOL SOFTENER LAXATIVE) 100 MG capsule Take 100 mg by mouth daily as needed for mild constipation.    . DROPLET PEN NEEDLES 31G X 8 MM MISC USE  TO INJECT EVERY DAY  WITH  LANTUS 100 each 5  . ferrous sulfate 325 (65 FE) MG EC tablet Take 325 mg by mouth every evening.     . gabapentin (NEURONTIN) 100 MG capsule Take 1 capsule (100 mg total) by mouth 2 (two) times daily. 180 capsule 3  . glucose blood (ACCU-CHEK SMARTVIEW) test strip TEST 4 TIMES DAILY 100 each 2  . Insulin Glargine (LANTUS SOLOSTAR) 100 UNIT/ML Solostar Pen Inject 28 Units into the skin 2 (two) times daily.     . methocarbamol (ROBAXIN) 500 MG tablet Take 1 tablet (500 mg total) by mouth every 8 (eight) hours as needed for muscle spasms (muscle pain). 20 tablet 0  . nystatin (MYCOSTATIN/NYSTOP) powder APPLY TOPICALLY TO  RASH ON LOWER ABDOMEN AT BEDTIME AS NEEDED 120 g 0  . olmesartan (BENICAR) 40 MG tablet TAKE 1 TABLET EVERY DAY (Patient taking differently: Take 40 mg by mouth at bedtime. ) 90 tablet 3  . omeprazole (PRILOSEC) 40 MG capsule Take 1 capsule (40 mg total) by mouth daily. 90 capsule 3  . potassium chloride (K-DUR) 10 MEQ tablet Take 1 tablet (10 mEq total) by mouth 2 (two) times daily. 180 tablet 3  . simvastatin (ZOCOR) 10 MG tablet TAKE 1 TABLET AT BEDTIME 90 tablet 1  . sodium chloride (OCEAN) 0.65 % nasal spray Place 1 spray into the nose daily as needed for congestion.     . TRADJENTA 5 MG TABS tablet TAKE 1 TABLET EVERY DAY  (Patient taking differently: Take 5 mg by mouth daily. ) 90 tablet 0  . traMADol (ULTRAM) 50 MG tablet One tablet two times daily , as needed, for severe back pain (Patient taking differently: Take 50 mg by mouth 2 (two) times daily as needed for moderate pain or severe pain (severe back pain). ) 20 tablet 0   No current facility-administered medications for this visit.    Allergies:  Amlodipine; Avandia [rosiglitazone maleate]; Glimepiride; Glipizide; Invokana [canagliflozin]; Lyrica [pregabalin]; Novolog [insulin aspart]; Rosiglitazone; Sitagliptin; and Aspirin   Social History: The patient  reports that she quit smoking about 16 years ago. Her smoking use included cigarettes. She has a 7.50 pack-year smoking history. She has never used smokeless tobacco. She reports that she does not drink alcohol or use drugs.   ROS:  Please see the history of present illness. Otherwise, complete review of systems is positive for right shoulder musculoskeletal pain - working with physical therapy.  All other systems are reviewed and negative.   Physical Exam: VS:  BP 140/80   Pulse 80   Ht 5' 1.5" (1.562 m)   Wt 170 lb (77.1 kg)   SpO2 99%   BMI 31.60 kg/m , BMI Body mass index is 31.6 kg/m.  Wt Readings from Last 3 Encounters:  05/15/18 170 lb (77.1 kg)  03/29/18 168 lb 14 oz (76.6 kg)  03/09/18 169 lb (76.7 kg)    General: Patient appears comfortable at rest. HEENT: Conjunctiva and lids normal, oropharynx clear. Neck: Supple, no elevated JVP or carotid bruits, no thyromegaly. Lungs: Clear to auscultation, nonlabored breathing at rest. Cardiac: Regular rate and rhythm, no S3, soft systolic murmur. Abdomen: Soft, nontender, bowel sounds present. Extremities: No pitting edema, distal pulses 2+.  ECG: I personally reviewed the tracing from 11/17/2017 which showed sinus rhythm with nonspecific T wave changes.  Recent Labwork: 10/29/2017: Magnesium 1.7 01/13/2018: TSH 1.72 01/14/2018: Hemoglobin  10.5; Platelets 221 03/01/2018: ALT 11; AST 12; BUN 17; Creat 1.53; Potassium 4.3; Sodium 138     Component Value Date/Time   CHOL 111 03/01/2018 0708   TRIG 125 03/01/2018 0708   HDL 38 (L) 03/01/2018 0708   CHOLHDL 2.9 03/01/2018 0708   VLDL 21 08/19/2016 0708   LDLCALC 52 03/01/2018 0708    Assessment and Plan:  1.  Essential hypertension.  Continues on clonidine and Benicar, no changes in regimen made today.  We also discussed sodium restriction and diet.  Continues to follow with Dr. Moshe Cipro.  2.  Mixed hyperlipidemia on Zocor with recent LDL 52.  3.  History of palpitations, none recently.  Continue observation for now.  Current medicines were reviewed with the patient today.  Disposition: Follow-up in 6 months.  Signed, Satira Sark,  MD, [facility] 05/15/2018 9:06 AM    [place] [facility] [address], [place], [place] [address] Phone: [phone]; Fax: [phone]

## 2018-05-15 ENCOUNTER — Other Ambulatory Visit: Payer: Self-pay | Admitting: Family Medicine

## 2018-05-15 ENCOUNTER — Encounter: Payer: Self-pay | Admitting: Cardiology

## 2018-05-15 ENCOUNTER — Ambulatory Visit (INDEPENDENT_AMBULATORY_CARE_PROVIDER_SITE_OTHER): Payer: Medicare HMO | Admitting: Cardiology

## 2018-05-15 VITALS — BP 140/80 | HR 80 | Ht 61.5 in | Wt 170.0 lb

## 2018-05-15 DIAGNOSIS — R002 Palpitations: Secondary | ICD-10-CM | POA: Diagnosis not present

## 2018-05-15 DIAGNOSIS — I10 Essential (primary) hypertension: Secondary | ICD-10-CM

## 2018-05-15 DIAGNOSIS — M546 Pain in thoracic spine: Secondary | ICD-10-CM | POA: Diagnosis not present

## 2018-05-15 DIAGNOSIS — M2560 Stiffness of unspecified joint, not elsewhere classified: Secondary | ICD-10-CM | POA: Diagnosis not present

## 2018-05-15 DIAGNOSIS — E782 Mixed hyperlipidemia: Secondary | ICD-10-CM

## 2018-05-15 DIAGNOSIS — M25511 Pain in right shoulder: Secondary | ICD-10-CM | POA: Diagnosis not present

## 2018-05-15 DIAGNOSIS — M542 Cervicalgia: Secondary | ICD-10-CM | POA: Diagnosis not present

## 2018-05-15 NOTE — Patient Instructions (Signed)
Medication Instructions:  Your physician recommends that you continue on your current medications as directed. Please refer to the Current Medication list given to you today.  If you need a refill on your cardiac medications before your next appointment, please call your pharmacy.   Lab work: None today If you have labs (blood work) drawn today and your tests are completely normal, you will receive your results only by: . MyChart Message (if you have MyChart) OR . A paper copy in the mail If you have any lab test that is abnormal or we need to change your treatment, we will call you to review the results.  Testing/Procedures: None today  Follow-Up: At [facility], you and your health needs are our priority.  As part of our continuing mission to provide you with exceptional heart care, we have created designated Provider Care Teams.  These Care Teams include your primary Cardiologist (physician) and Advanced Practice Providers (APPs -  Physician Assistants and Nurse Practitioners) who all work together to provide you with the care you need, when you need it. You will need a follow up appointment in 6 months.  Please call our office 2 months in advance to schedule this appointment.  You may see Samuel McDowell, MD or one of the following Advanced Practice Providers on your designated Care Team:   Brittany Strader, PA-C ([place] Office) . Michele Lenze, PA-C ( Office)   Any Other Special Instructions Will Be Listed Below (If Applicable). none   

## 2018-05-17 ENCOUNTER — Other Ambulatory Visit: Payer: Self-pay | Admitting: Cardiology

## 2018-05-18 DIAGNOSIS — M2560 Stiffness of unspecified joint, not elsewhere classified: Secondary | ICD-10-CM | POA: Diagnosis not present

## 2018-05-18 DIAGNOSIS — M542 Cervicalgia: Secondary | ICD-10-CM | POA: Diagnosis not present

## 2018-05-18 DIAGNOSIS — M25511 Pain in right shoulder: Secondary | ICD-10-CM | POA: Diagnosis not present

## 2018-05-18 DIAGNOSIS — M546 Pain in thoracic spine: Secondary | ICD-10-CM | POA: Diagnosis not present

## 2018-05-23 DIAGNOSIS — M546 Pain in thoracic spine: Secondary | ICD-10-CM | POA: Diagnosis not present

## 2018-05-23 DIAGNOSIS — M542 Cervicalgia: Secondary | ICD-10-CM | POA: Diagnosis not present

## 2018-05-23 DIAGNOSIS — M2560 Stiffness of unspecified joint, not elsewhere classified: Secondary | ICD-10-CM | POA: Diagnosis not present

## 2018-05-23 DIAGNOSIS — M25511 Pain in right shoulder: Secondary | ICD-10-CM | POA: Diagnosis not present

## 2018-05-25 ENCOUNTER — Encounter: Payer: Self-pay | Admitting: Family Medicine

## 2018-05-25 ENCOUNTER — Ambulatory Visit (INDEPENDENT_AMBULATORY_CARE_PROVIDER_SITE_OTHER): Payer: Medicare HMO | Admitting: Family Medicine

## 2018-05-25 VITALS — BP 176/68 | HR 100 | Temp 99.5°F | Resp 14 | Ht 61.5 in | Wt 172.0 lb

## 2018-05-25 DIAGNOSIS — R509 Fever, unspecified: Secondary | ICD-10-CM | POA: Diagnosis not present

## 2018-05-25 DIAGNOSIS — J209 Acute bronchitis, unspecified: Secondary | ICD-10-CM | POA: Diagnosis not present

## 2018-05-25 DIAGNOSIS — N183 Chronic kidney disease, stage 3 unspecified: Secondary | ICD-10-CM

## 2018-05-25 DIAGNOSIS — I10 Essential (primary) hypertension: Secondary | ICD-10-CM

## 2018-05-25 DIAGNOSIS — E1122 Type 2 diabetes mellitus with diabetic chronic kidney disease: Secondary | ICD-10-CM | POA: Diagnosis not present

## 2018-05-25 DIAGNOSIS — R6883 Chills (without fever): Secondary | ICD-10-CM

## 2018-05-25 MED ORDER — PENICILLIN V POTASSIUM 500 MG PO TABS: 500.0000 mg | ORAL_TABLET | Freq: Three times a day (TID) | ORAL | 0 refills | Status: AC

## 2018-05-25 MED ORDER — BENZONATATE 100 MG PO CAPS: 100.0000 mg | ORAL_CAPSULE | Freq: Two times a day (BID) | ORAL | 0 refills | Status: AC | PRN

## 2018-05-25 MED ORDER — PROMETHAZINE-DM 6.25-15 MG/5ML PO SYRP: ORAL_SOLUTION | ORAL | 0 refills | Status: AC

## 2018-05-25 NOTE — Progress Notes (Signed)
Christine Dominguez     MRN: [id number]      DOB: 11/26/1950   HPI Christine Dominguez 2 day h/o cough, fever undocumented, chills and body aches, positive contact with someone with influenza.Dry hacking cough, no sore throAT OR SINUS DRAINAGE. 2 DAY H/O EXCEESSIVE FATIGUE AND POOR ENERGY. nO URINAY SYMPTOMS OR LOOSE STOOL ROS  Denies chest pains, palpitations and leg swelling Denies abdominal pain, nausea, vomiting,diarrhea or constipation.   Denies dysuria, frequency, hesitancy or incontinence.  Denies headaches, seizures, numbness, or tingling. Denies depression, anxiety or insomnia. Denies skin break down or rash.   PE  BP (!) 176/68   Pulse 100   Temp 99.5 F (37.5 C) (Oral)   Resp 14   Ht 5' 1.5" (1.562 m)   Wt 172 lb 0.6 oz (78 kg)   SpO2 98% Comment: room air  BMI 31.98 kg/m   Patient alert and oriented and, low grade  Temp, ill appearing  HEENT: No facial asymmetry, EOMI,   oropharynx pink and moist.  Neck decreased ROM no JVD, no mass.  Chest:adequate air entry,  no wheezes, scattered crackles   CVS: S1, S2 no murmurs, no S3.Regular rate.  ABD: Soft non tender.   Ext: No edema    Skin: Intact, no ulcerations or rash noted.  Psych: Good eye contact, normal affect.  not anxious or depressed appearing.  CNS: CN 2-12 intact, power,  normal throughout.no focal deficits noted.   Assessment & Plan  Acute bronchitis  Acute onset of cough and chills, with negative flu test CXR, penicillin, tessalon perle and phenergan DM  Fever and chills Reports sick contact 4 days prior with person who had influenza , flu test in office is negative  Malignant hypertension Elevated blood pressure, however blood pressure is chronically uncontrolled and elevated, no medication changes made at this visit  Diabetes mellitus with stage 3 chronic kidney disease ([place]) Christine Dominguez is reminded of the importance of commitment to daily physical activity for 30  minutes or more, as able and the need to limit carbohydrate intake to 30 to 60 grams per meal to help with blood sugar control.  Uncontrolled and managed by Endo, needs to make and  keep appointment The need to take medication as prescribed, test blood sugar as directed, and to call between visits if there is a concern that blood sugar is uncontrolled is also discussed.   Christine Dominguez is reminded of the importance of daily foot exam, annual eye examination, and good blood sugar, blood pressure and cholesterol control.  Diabetic Labs Latest Ref Rng & Units 03/01/2018 01/14/2018 01/13/2018 11/17/2017 10/29/2017  HbA1c 4.0 - 6.0 - - 8.3(A) - -  Microalbumin Not estab mg/dL - - - - -  Micro/Creat Ratio <30 mcg/mg creat - - - - -  Chol <200 mg/dL 111 - - - -  HDL >50 mg/dL 38(L) - - - -  Calc LDL mg/dL (calc) 52 - - - -  Triglycerides <150 mg/dL 125 - - - -  Creatinine 0.50 - 0.99 mg/dL 1.53(H) 1.35(H) - 1.23(H) 1.29(H)   BP/Weight 05/25/2018 05/15/2018 03/30/2018 03/29/2018 03/09/2018 01/14/2018 22/09/3333  Systolic BP 456 256 389 - 373 428 768  Diastolic BP 68 80 72 - 78 71 80  Wt. (Lbs) 172.04 170 - 168.87 169 171 171  BMI 31.98 31.6 - 31.91 31.93 32.31 32.31   Foot/eye exam completion dates Latest Ref Rng & Units 10/26/2017 08/26/2016  Eye Exam No Retinopathy - -  Foot  exam Order - - -  Foot Form Completion - Done Done

## 2018-05-25 NOTE — Assessment & Plan Note (Addendum)
  Acute onset of cough and chills, with negative flu test CXR, penicillin, tessalon perle and phenergan DM

## 2018-05-25 NOTE — Patient Instructions (Signed)
Please keep May appointment , call if you need me sooner  Your flu test is NEGATIVE  I am treating you for acute bronchitis, please get CXR ordered next Monday if no better Penicillin, tessalon perles and cough suppressant syrup are at [place] will give you Glucerna when checking you out, since you are weak  Hope you feel better soon

## 2018-05-26 ENCOUNTER — Encounter: Payer: Self-pay | Admitting: Family Medicine

## 2018-05-26 ENCOUNTER — Telehealth: Payer: Self-pay | Admitting: Family Medicine

## 2018-05-26 DIAGNOSIS — R509 Fever, unspecified: Secondary | ICD-10-CM | POA: Insufficient documentation

## 2018-05-29 LAB — POC INFLUENZA A&B (BINAX/QUICKVUE)
INFLUENZA A, POC: NEGATIVE
INFLUENZA B, POC: NEGATIVE

## 2018-06-04 NOTE — Telephone Encounter (Signed)
Called at 12:20 pm notified by paramedics that patient has been found deceased today on the commode with rigor mortis already set in. I advised that I would sign her death certificate, she had been evaluated in the office the day before with a diagnosis of acute bronchitis and she has multiple co morbidities

## 2018-06-04 NOTE — Assessment & Plan Note (Signed)
Reports sick contact 4 days prior with person who had influenza , flu test in office is negative

## 2018-06-04 NOTE — Assessment & Plan Note (Addendum)
Christine Dominguez is reminded of the importance of commitment to daily physical activity for 30 minutes or more, as able and the need to limit carbohydrate intake to 30 to 60 grams per meal to help with blood sugar control.  Uncontrolled and managed by Endo, needs to make and  keep appointment The need to take medication as prescribed, test blood sugar as directed, and to call between visits if there is a concern that blood sugar is uncontrolled is also discussed.   Christine Dominguez is reminded of the importance of daily foot exam, annual eye examination, and good blood sugar, blood pressure and cholesterol control.  Diabetic Labs Latest Ref Rng & Units 03/01/2018 01/14/2018 01/13/2018 11/17/2017 10/29/2017  HbA1c 4.0 - 6.0 - - 8.3(A) - -  Microalbumin Not estab mg/dL - - - - -  Micro/Creat Ratio <30 mcg/mg creat - - - - -  Chol <200 mg/dL 111 - - - -  HDL >50 mg/dL 38(L) - - - -  Calc LDL mg/dL (calc) 52 - - - -  Triglycerides <150 mg/dL 125 - - - -  Creatinine 0.50 - 0.99 mg/dL 1.53(H) 1.35(H) - 1.23(H) 1.29(H)   BP/Weight 05/25/2018 05/15/2018 03/30/2018 03/29/2018 03/09/2018 01/14/2018 79/10/2818  Systolic BP 601 561 537 - 943 276 147  Diastolic BP 68 80 72 - 78 71 80  Wt. (Lbs) 172.04 170 - 168.87 169 171 171  BMI 31.98 31.6 - 31.91 31.93 32.31 32.31   Foot/eye exam completion dates Latest Ref Rng & Units 10/26/2017 08/26/2016  Eye Exam No Retinopathy - -  Foot exam Order - - -  Foot Form Completion - Done Done

## 2018-06-04 NOTE — Assessment & Plan Note (Signed)
Elevated blood pressure, however blood pressure is chronically uncontrolled and elevated, no medication changes made at this visit

## 2018-06-04 DEATH — deceased

## 2018-08-08 ENCOUNTER — Ambulatory Visit: Payer: Medicare HMO | Admitting: Family Medicine

## 2018-09-08 IMAGING — DX DG CHEST 2V
2 series · 2 of 2 positions shown · non-contrast
Comparison: 06/17/2015

CLINICAL DATA: Palpitations since [REDACTED]. History of diabetes and
hypertension.

EXAM:
CHEST - 2 VIEW

[chest pa]
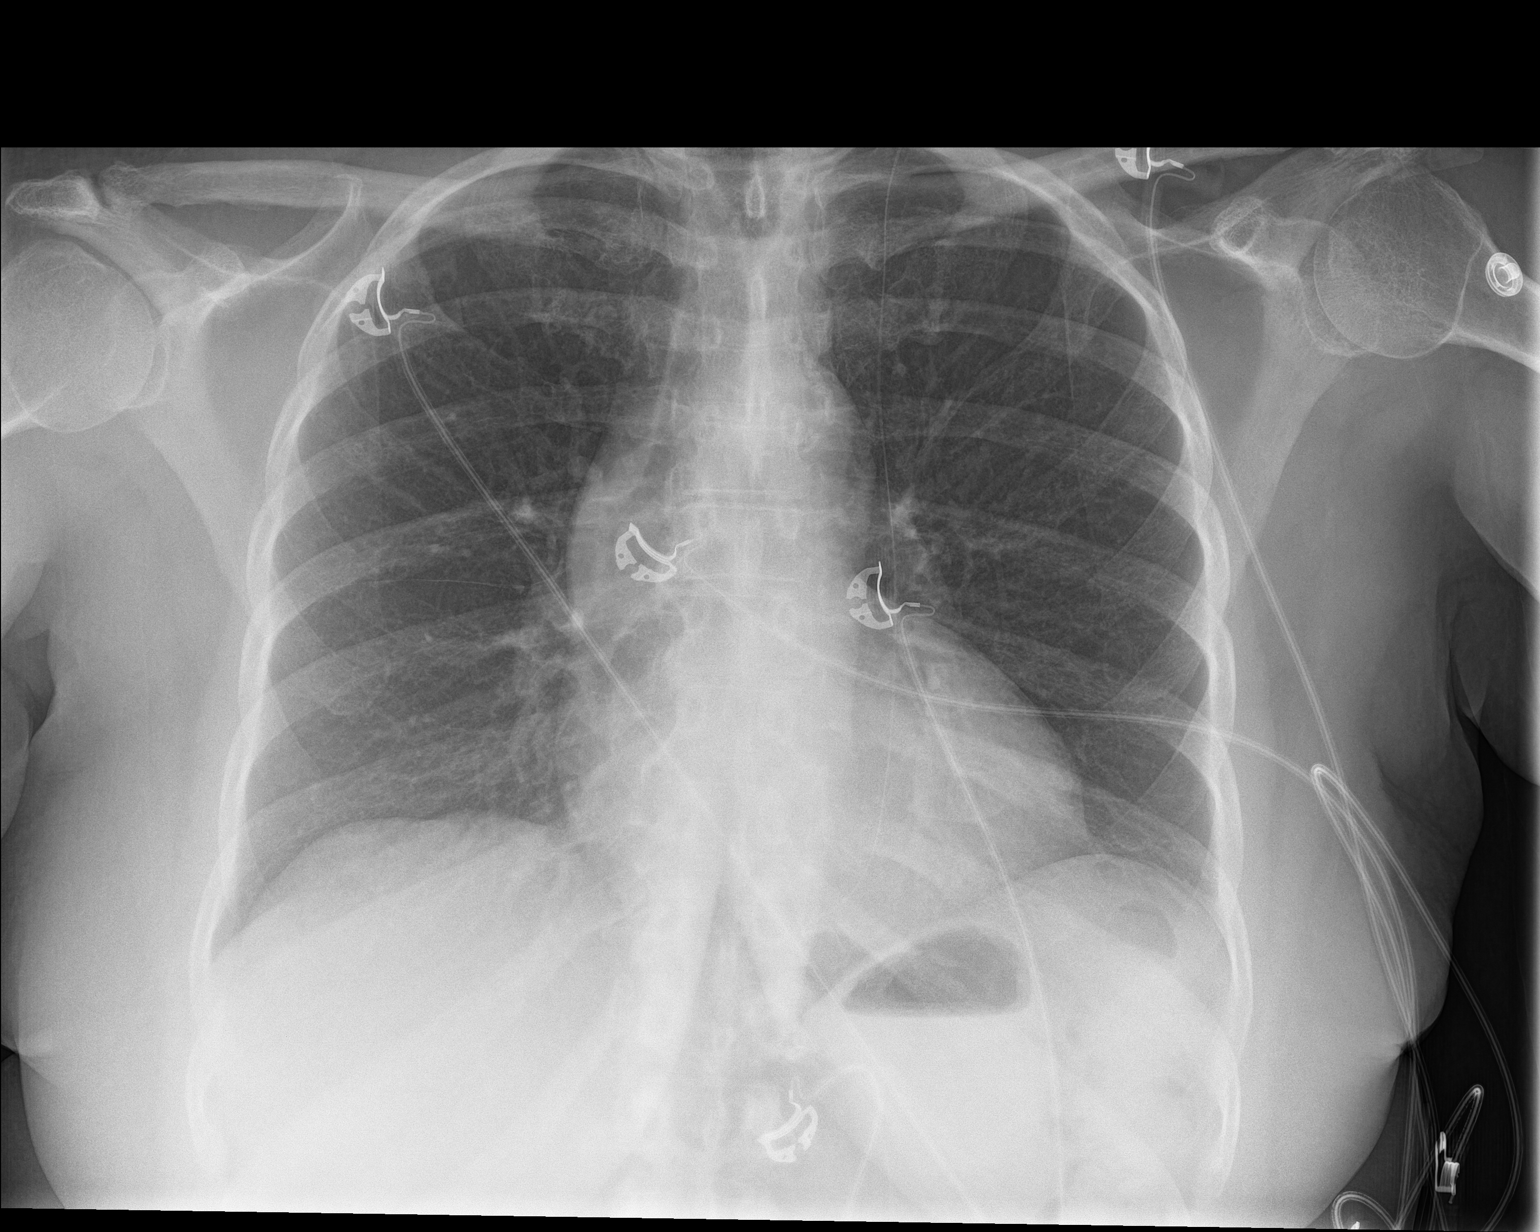

[chest lat]
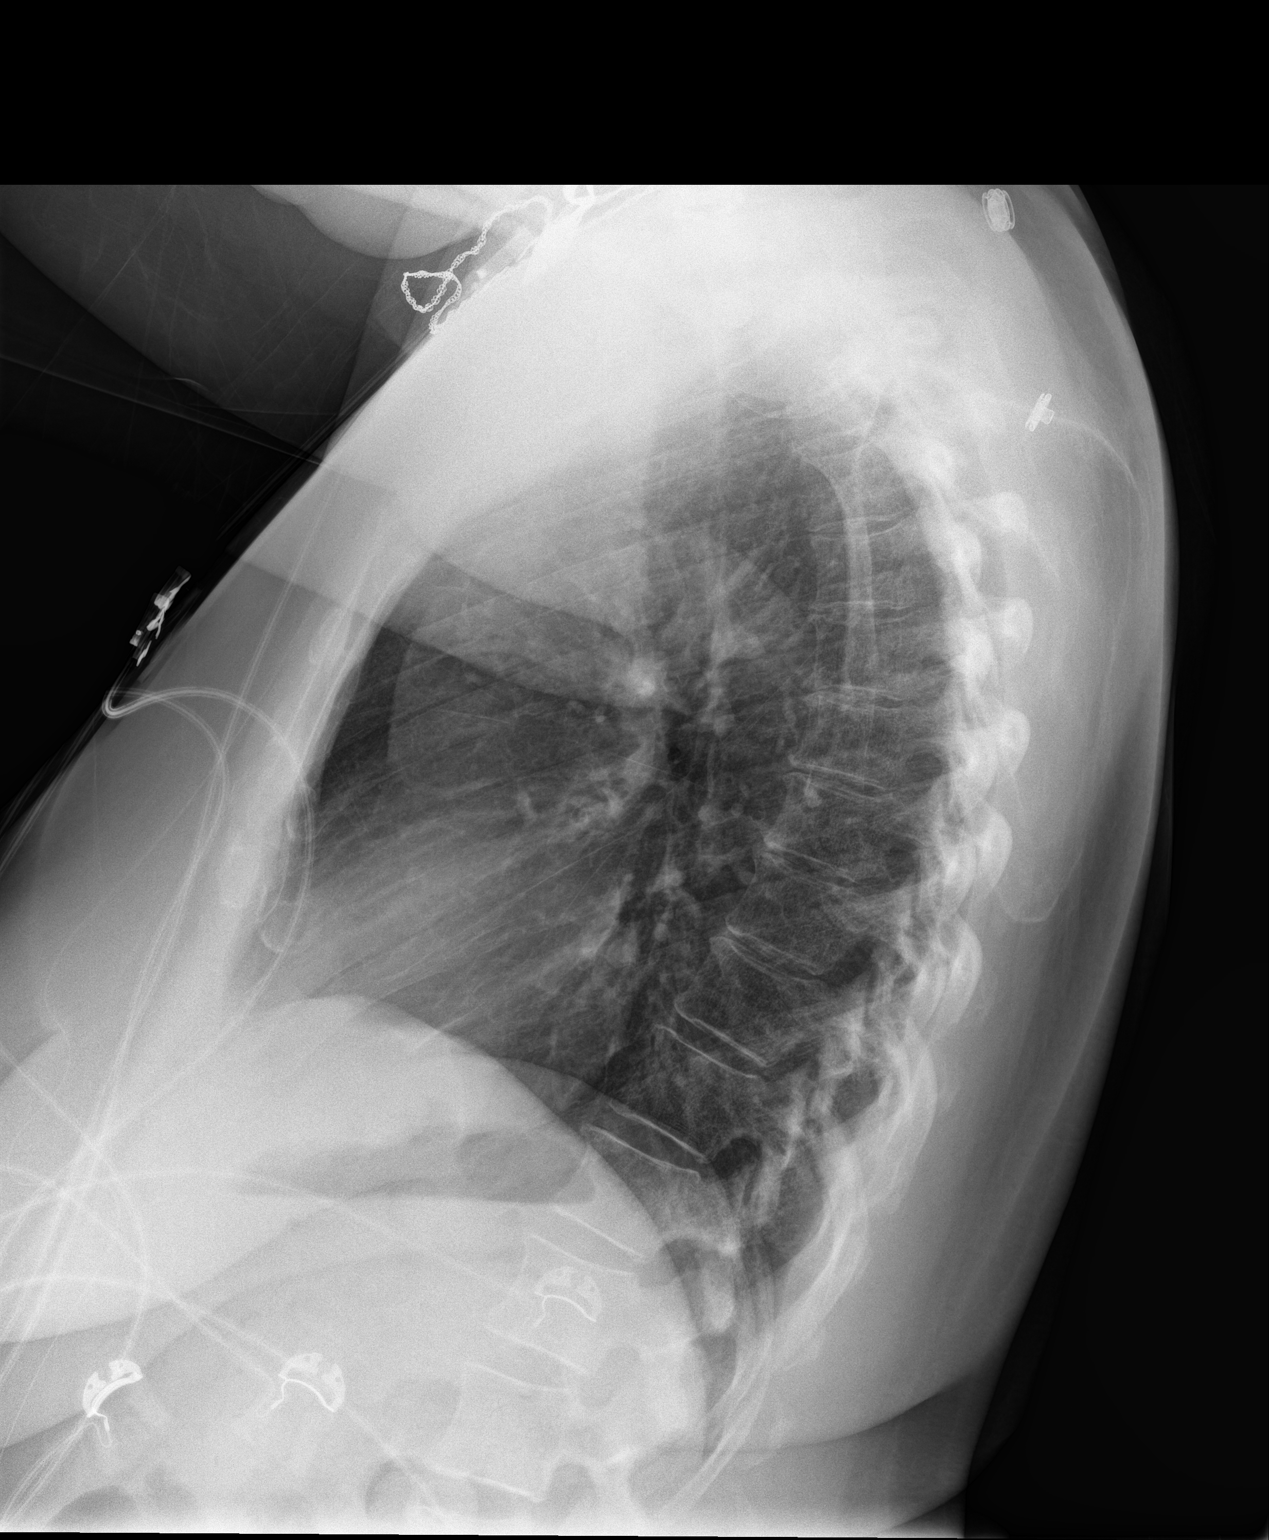

[2 of 2 positions shown; findings below may reference images not displayed]

FINDINGS: The heart size and mediastinal contours are within normal limits.
Both lungs are clear. The visualized skeletal structures are
unremarkable.
IMPRESSION: No active cardiopulmonary disease.
